# Patient Record
Sex: Male | Born: 1945 | Race: White | Hispanic: No | Marital: Married | Smoking: Current every day smoker
Health system: Southern US, Community
[De-identification: ages and names within clinical notes are randomized; demographics above are authoritative.]

## PROBLEM LIST (undated history)

## (undated) DIAGNOSIS — E785 Hyperlipidemia, unspecified: Secondary | ICD-10-CM

## (undated) DIAGNOSIS — Z86718 Personal history of other venous thrombosis and embolism: Secondary | ICD-10-CM

## (undated) DIAGNOSIS — I10 Essential (primary) hypertension: Secondary | ICD-10-CM

## (undated) DIAGNOSIS — I219 Acute myocardial infarction, unspecified: Secondary | ICD-10-CM

## (undated) DIAGNOSIS — I6529 Occlusion and stenosis of unspecified carotid artery: Secondary | ICD-10-CM

## (undated) DIAGNOSIS — Z860100 Personal history of colon polyps, unspecified: Secondary | ICD-10-CM

## (undated) HISTORY — DX: Personal history of colonic polyps: Z86.010

## (undated) HISTORY — DX: Hyperlipidemia, unspecified: E78.5

## (undated) HISTORY — PX: CARDIAC SURGERY: SHX584

## (undated) HISTORY — DX: Personal history of other venous thrombosis and embolism: Z86.718

## (undated) HISTORY — DX: Personal history of colon polyps, unspecified: Z86.0100

## (undated) HISTORY — DX: Occlusion and stenosis of unspecified carotid artery: I65.29

## (undated) HISTORY — PX: COLONOSCOPY: SHX174

## (undated) HISTORY — DX: Essential (primary) hypertension: I10

---

## 1988-12-06 HISTORY — PX: CORONARY ARTERY BYPASS GRAFT: SHX141

## 2010-07-11 ENCOUNTER — Emergency Department: Payer: Self-pay | Admitting: Emergency Medicine

## 2011-10-19 ENCOUNTER — Ambulatory Visit: Payer: Self-pay | Admitting: Gastroenterology

## 2011-12-14 ENCOUNTER — Ambulatory Visit: Payer: Self-pay | Admitting: Gastroenterology

## 2013-08-10 DIAGNOSIS — N402 Nodular prostate without lower urinary tract symptoms: Secondary | ICD-10-CM | POA: Insufficient documentation

## 2013-08-10 DIAGNOSIS — N138 Other obstructive and reflux uropathy: Secondary | ICD-10-CM | POA: Insufficient documentation

## 2013-08-10 DIAGNOSIS — R339 Retention of urine, unspecified: Secondary | ICD-10-CM | POA: Insufficient documentation

## 2013-08-28 ENCOUNTER — Ambulatory Visit: Payer: Self-pay | Admitting: Physical Medicine and Rehabilitation

## 2014-06-30 DIAGNOSIS — N401 Enlarged prostate with lower urinary tract symptoms: Secondary | ICD-10-CM

## 2014-06-30 DIAGNOSIS — Z951 Presence of aortocoronary bypass graft: Secondary | ICD-10-CM | POA: Insufficient documentation

## 2014-06-30 DIAGNOSIS — I10 Essential (primary) hypertension: Secondary | ICD-10-CM | POA: Insufficient documentation

## 2014-06-30 DIAGNOSIS — N138 Other obstructive and reflux uropathy: Secondary | ICD-10-CM | POA: Insufficient documentation

## 2014-06-30 DIAGNOSIS — I2510 Atherosclerotic heart disease of native coronary artery without angina pectoris: Secondary | ICD-10-CM | POA: Insufficient documentation

## 2014-06-30 DIAGNOSIS — M48062 Spinal stenosis, lumbar region with neurogenic claudication: Secondary | ICD-10-CM | POA: Insufficient documentation

## 2014-06-30 DIAGNOSIS — K635 Polyp of colon: Secondary | ICD-10-CM | POA: Insufficient documentation

## 2015-07-17 DIAGNOSIS — Z0000 Encounter for general adult medical examination without abnormal findings: Secondary | ICD-10-CM | POA: Insufficient documentation

## 2017-12-12 NOTE — Progress Notes (Signed)
[facility], [place] [address] Phone: [phone] Subjective:     CC: low back pain   EQA:[id number]  ACXEL Gregory Shepherd is a 72 y.o. male coming in with complaint of low back pain. He has had pain for 7 years. History of MRI confirming bulging discs. He said that his sciatic nerve pain has gotten worse. The right leg is always sore with movement and to touch. Walking and forward flexion both him the most. He also has numbness in the right leg. He use to walk 2.5 miles but is now only able to walk .25 miles. Stairs and inclines also bother him. His pain is achy.  Patient states that unfortunately this is changed his quality of life tremendously.  Sometimes a dull throbbing aching sensation at night as well.     History reviewed. No pertinent past medical history.  Past medical history is significant for multiple cardiovascular problems. History reviewed. No pertinent surgical history. Social History   Socioeconomic History  . Marital status: Married    Spouse name: None  . Number of children: None  . Years of education: None  . Highest education level: None  Social Needs  . Financial resource strain: None  . Food insecurity - worry: None  . Food insecurity - inability: None  . Transportation needs - medical: None  . Transportation needs - non-medical: None  Occupational History  . None  Tobacco Use  . Smoking status: None  Substance and Sexual Activity  . Alcohol use: None  . Drug use: None  . Sexual activity: None  Other Topics Concern  . None  Social History Narrative  . None   Not on File History reviewed. No pertinent family history.   Past medical history, social, surgical and family history all reviewed in electronic medical record.  No pertanent information unless stated regarding to the chief complaint.   Review of Systems:Review of systems updated and as accurate as of 12/13/17  No headache, visual changes,  nausea, vomiting, diarrhea, constipation, dizziness, abdominal pain, skin rash, fevers, chills, night sweats, weight loss, swollen lymph nodes, body aches, joint swelling,chest pain, shortness of breath, mood changes.  Positive muscle aches  Objective  Blood pressure 140/64, pulse (!) 50, height 6\' 3"  (1.905 m), weight 209 lb (94.8 kg), SpO2 98 %. Systems examined below as of 12/13/17   General: No apparent distress alert and oriented x3 mood and affect normal, dressed appropriately.  HEENT: Pupils equal, extraocular movements intact  Respiratory: Patient's speak in full sentences and does not appear short of breath  Cardiovascular: Trace lower extremity edema, non tender, no erythema  Skin: Warm dry intact with no signs of infection or rash on extremities or on axial skeleton.  Abdomen: Soft nontender  Neuro: Cranial nerves II through XII are intact, neurovascularly intact in all extremities with 2+ DTRs and 1+ distal pulses of the dorsalis pedis and the posterior tibialis Lymph: No lymphadenopathy of posterior or anterior cervical chain or axillae bilaterally.  Gait normal with good balance and coordination.  MSK:  Non tender with full range of motion and good stability and symmetric strength and tone of shoulders, elbows, wrist,  knee and ankles bilaterally.  Arthritic changes of multiple joints Back exam shows the patient does have decreasing range of motion in all planes.  Patient's does not have a negative straight leg test.  Patient though does have a positive fulcrum test severely as well as pain to percussion  of the foot at the groin as well as the femur area.  Tenderness over the quadricep as well.  4+ out of 5 strength of the right leg compared to the contralateral side.  No pain with internal rotation no of the hip.   Impression and Recommendations:     This case required medical decision making of moderate complexity.      Note: This dictation was prepared with Dragon  dictation along with smaller phrase technology. Any transcriptional errors that result from this process are unintentional.

## 2017-12-13 ENCOUNTER — Encounter: Payer: Self-pay | Admitting: Family Medicine

## 2017-12-13 ENCOUNTER — Ambulatory Visit: Payer: Medicare Other | Admitting: Family Medicine

## 2017-12-13 ENCOUNTER — Ambulatory Visit (INDEPENDENT_AMBULATORY_CARE_PROVIDER_SITE_OTHER)
Admission: RE | Admit: 2017-12-13 | Discharge: 2017-12-13 | Disposition: A | Discharge status: Home / Self Care | Payer: Medicare Other | Source: Ambulatory Visit | Attending: Family Medicine | Admitting: Family Medicine

## 2017-12-13 VITALS — BP 140/64 | HR 50 | Ht 75.0 in | Wt 209.0 lb

## 2017-12-13 DIAGNOSIS — M5416 Radiculopathy, lumbar region: Secondary | ICD-10-CM

## 2017-12-13 DIAGNOSIS — M79604 Pain in right leg: Secondary | ICD-10-CM

## 2017-12-13 MED ORDER — VITAMIN D (ERGOCALCIFEROL) 1.25 MG (50000 UNIT) PO CAPS: 50000.0000 [IU] | ORAL_CAPSULE | ORAL | 0 refills | Status: DC

## 2017-12-13 NOTE — Patient Instructions (Signed)
Good to meet you  Xrays downstairs Ice 20 minutes 2 times daily. Usually after activity and before bed. Avoid excessive walking Once weekly vitamin D for likely 12 weeks Thigh compression sleeve daily until I see you again but do not wear it at night They will call you about the test for the blood flow in the legs  See me again in 2-3 weeks

## 2017-12-13 NOTE — Assessment & Plan Note (Addendum)
I am concerned more for potential stress fracture of the femur than truly a lumbar radiculopathy at this time.  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  Patient is on gabapentin and is not noticing any improvement.  Do not feel going up on the dose would be beneficial.  We discussed icing regimen, compression, patient will try once weekly vitamin D.  Home exercises not given at this moment but will follow-up again in 2 weeks patient's history of vascular compromise I do feel that ABI could be beneficial as well to rule out anything else pathologic.  If continued have pain consider laboratory workup.

## 2017-12-15 ENCOUNTER — Ambulatory Visit (HOSPITAL_COMMUNITY)
Admission: RE | Admit: 2017-12-15 | Discharge: 2017-12-15 | Disposition: A | Discharge status: Home / Self Care | Payer: Medicare Other | Source: Ambulatory Visit | Attending: Cardiology | Admitting: Cardiology

## 2017-12-15 DIAGNOSIS — M79604 Pain in right leg: Secondary | ICD-10-CM

## 2017-12-15 DIAGNOSIS — I739 Peripheral vascular disease, unspecified: Secondary | ICD-10-CM | POA: Insufficient documentation

## 2017-12-15 DIAGNOSIS — R9439 Abnormal result of other cardiovascular function study: Secondary | ICD-10-CM | POA: Diagnosis not present

## 2017-12-19 ENCOUNTER — Telehealth: Payer: Self-pay | Admitting: Urology

## 2017-12-19 NOTE — Telephone Encounter (Signed)
Patient is transferring care with you and has a follow up app on the 30 th but will be out of his finasteride before the app. Can you call him in a refill to the [facility] please and thank you.  Sharyn Lull

## 2017-12-20 MED ORDER — FINASTERIDE 5 MG PO TABS: 5.0000 mg | ORAL_TABLET | Freq: Every day | ORAL | 0 refills | Status: DC

## 2017-12-20 NOTE — Telephone Encounter (Signed)
rx sent

## 2017-12-20 NOTE — Addendum Note (Signed)
Addended by: John Giovanni C on: 12/20/2017 02:09 PM   Modules accepted: Orders

## 2017-12-21 ENCOUNTER — Telehealth: Payer: Self-pay | Admitting: Urology

## 2017-12-21 NOTE — Telephone Encounter (Signed)
Patient notified of refill  Sharyn Lull

## 2017-12-28 NOTE — Progress Notes (Signed)
[name] [place], [place] [address] Phone: [phone] Subjective:    CC: Back and leg pain follow-up  VEL:[id number]  Gregory Shepherd is a 72 y.o. male coming in with complaint of leg pain follow-up.  Patient was found to have pain that seems to be more secondary to a stress reaction to the femur.  History of lumbar radiculopathy as well.  Patient is on gabapentin already and no significant benefit.  Given once weekly vitamin D and we discussed compression.  Patient has a past medical history significant for vascular compromise as well. Patient was sent for an ABI and did have moderate right lower extremity arterial disease with a right toe brachial index that is abnormal.   He states that he is doing better. He has been walking and is able to walk longer. He said that he has good days and bad days though. Inclines always bother him he states.     No past medical history on file. No past surgical history on file. Social History   Socioeconomic History  . Marital status: Married    Spouse name: Not on file  . Number of children: Not on file  . Years of education: Not on file  . Highest education level: Not on file  Social Needs  . Financial resource strain: Not on file  . Food insecurity - worry: Not on file  . Food insecurity - inability: Not on file  . Transportation needs - medical: Not on file  . Transportation needs - non-medical: Not on file  Occupational History  . Not on file  Tobacco Use  . Smoking status: Not on file  Substance and Sexual Activity  . Alcohol use: Not on file  . Drug use: Not on file  . Sexual activity: Not on file  Other Topics Concern  . Not on file  Social History Narrative  . Not on file   Not on File No family history on file.  No family history of autoimmune disease   Past medical history, social, surgical and family history all reviewed in electronic medical record.  No pertanent  information unless stated regarding to the chief complaint.   Review of Systems:Review of systems updated and as accurate as of 12/29/17  No headache, visual changes, nausea, vomiting, diarrhea, constipation, dizziness, abdominal pain, skin rash, fevers, chills, night sweats, weight loss, swollen lymph nodes, body aches, joint swelling, , chest pain, shortness of breath, mood changes.  Positive muscle aches  Objective  Blood pressure (!) 120/58, pulse (!) 54, height 6\' 3"  (1.905 m), weight 209 lb (94.8 kg), SpO2 95 %. Systems examined below as of 12/29/17   General: No apparent distress alert and oriented x3 mood and affect normal, dressed appropriately.  HEENT: Pupils equal, extraocular movements intact  Respiratory: Patient's speak in full sentences and does not appear short of breath  Cardiovascular: No lower extremity edema, non tender, no erythema  Skin: Warm dry intact with no signs of infection or rash on extremities or on axial skeleton.  Abdomen: Soft nontender  Neuro: Cranial nerves II through XII are intact, neurovascularly intact in all extremities with 2+ DTRs and 1+ pulses on the right lower extremity.   Lymph: No lymphadenopathy of posterior or anterior cervical chain or axillae bilaterally.  Gait antalgic gait MSK:  Non tender with full range of motion and good stability and symmetric strength and tone of shoulders, elbows, wrist, hip, knee and ankles bilaterally.  Right  femur is still significantly tender to palpation.  Does have atrophy of the musculature compared to the contralateral side.  1+ dorsalis pedis and posterior tibialis pulses noted.  Patient still has pain to palpation over the femur itself.  Positive fulcrum test.  Negative straight leg test.  Mild discomfort in the paraspinal musculature lumbar spine.  Near full range of motion of the hip.     Impression and Recommendations:     This case required medical decision making of moderate complexity.       Note: This dictation was prepared with Dragon dictation along with smaller phrase technology. Any transcriptional errors that result from this process are unintentional.

## 2017-12-29 ENCOUNTER — Ambulatory Visit: Payer: Medicare Other | Admitting: Family Medicine

## 2017-12-29 VITALS — BP 120/58 | HR 54 | Ht 75.0 in | Wt 209.0 lb

## 2017-12-29 DIAGNOSIS — M79604 Pain in right leg: Secondary | ICD-10-CM

## 2017-12-29 DIAGNOSIS — M79606 Pain in leg, unspecified: Secondary | ICD-10-CM

## 2017-12-29 MED ORDER — GABAPENTIN 100 MG PO CAPS: 100.0000 mg | ORAL_CAPSULE | Freq: Every day | ORAL | 3 refills | Status: DC

## 2017-12-29 NOTE — Patient Instructions (Signed)
Good to see you  I am concern of the blood flow. We will get you with vascular  [email or web address] size medium Continue the vitamin D still  Lets give it a little more time See me again in 4 weeks

## 2017-12-29 NOTE — Assessment & Plan Note (Signed)
Patient's right leg pain is likely secondary to peripheral vascular disease. Will send to vascular for further referral and consultation.  May need possible intervention.  Likely could be contributing to some of the aches and pains.  Still we will continue the vitamin D if there is any stress reaction.  No advanced imaging done at this time though.  X-rays of the back did show some progression of the arthritis and could have some lumbar radiculopathy.  We discussed the possibility of increasing the gabapentin to 400 mg at night.  Follow-up again 4 weeks

## 2018-01-04 ENCOUNTER — Ambulatory Visit (INDEPENDENT_AMBULATORY_CARE_PROVIDER_SITE_OTHER): Payer: Medicare Other | Admitting: Urology

## 2018-01-04 ENCOUNTER — Encounter: Payer: Self-pay | Admitting: Urology

## 2018-01-04 VITALS — BP 162/57 | HR 53 | Ht 75.0 in | Wt 211.4 lb

## 2018-01-04 DIAGNOSIS — R351 Nocturia: Secondary | ICD-10-CM | POA: Diagnosis not present

## 2018-01-04 DIAGNOSIS — N401 Enlarged prostate with lower urinary tract symptoms: Secondary | ICD-10-CM | POA: Diagnosis not present

## 2018-01-04 DIAGNOSIS — N138 Other obstructive and reflux uropathy: Secondary | ICD-10-CM

## 2018-01-04 DIAGNOSIS — E119 Type 2 diabetes mellitus without complications: Secondary | ICD-10-CM | POA: Insufficient documentation

## 2018-01-04 MED ORDER — FINASTERIDE 5 MG PO TABS: 5.0000 mg | ORAL_TABLET | Freq: Every day | ORAL | 3 refills | Status: DC

## 2018-01-04 MED ORDER — DESMOPRESSIN ACETATE 1.66 MCG/0.1ML NA EMUL: 1.6600 ug | Freq: Every day | NASAL | 0 refills | Status: DC

## 2018-01-04 NOTE — Progress Notes (Signed)
01/04/2018 8:26 PM   [name] 10-16-1946 [id number]  Referring provider: Kirk Ruths, MD [facility] [place], [place] [address]  Chief Complaint  Patient presents with  . Benign Prostatic Hypertrophy    HPI: 72 year old male presents for annual follow-up of BPH and a left prostate nodule.  A biopsy was performed in October 2014 for a left prostate nodule.  His PSA was initially elevated in the 4 range however returned to baseline at 2.4 on repeat.  Prostate volume was 107 cc.  He was tamsulosin and finasteride initially on combination therapy with was able to discontinue the  tamsulosin.  His most recent PSA has been low at less than 1.  He states his PSA was drawn at [facility] in August 2018 however was apparently not.  He is due to have blood drawn in February.  His only bothersome symptom is nocturia x2 at moderate to large volumes which is bothersome and has interrupted his sleep pattern.  He was inquiring if any other treatment options are available.  His serum sodium has been normal.   PMH: Past Medical History:  Diagnosis Date  . Carotid artery narrowing   . Hyperlipidemia   . Hypertension     Surgical History: Past Surgical History:  Procedure Laterality Date  . CARDIAC SURGERY      Home Medications:  Allergies as of 01/04/2018   No Known Allergies     Medication List        Accurate as of 01/04/18  8:26 PM. Always use your most recent med list.          amLODipine-benazepril 5-10 MG capsule Commonly known as:  LOTREL Take 1 capsule by mouth daily.   aspirin 81 MG chewable tablet Chew by mouth daily.   atenolol 25 MG tablet Commonly known as:  TENORMIN Take 25 mg by mouth daily.   atorvastatin 80 MG tablet Commonly known as:  LIPITOR Take 80 mg by mouth daily.   finasteride 5 MG tablet Commonly known as:  PROSCAR Take 1 tablet (5 mg total) by mouth daily.   gabapentin 300 MG  capsule Commonly known as:  NEURONTIN Take 300 mg by mouth 3 (three) times daily.   gabapentin 100 MG capsule Commonly known as:  NEURONTIN Take 1 capsule (100 mg total) by mouth at bedtime.   isosorbide mononitrate 60 MG 24 hr tablet Commonly known as:  IMDUR Take 60 mg by mouth daily.   multivitamin tablet Take 1 tablet by mouth daily.   NITROMIST 400 MCG/SPRAY Aers Generic drug:  Nitroglycerin Place onto the tongue.   omeprazole 20 MG capsule Commonly known as:  PRILOSEC Take 20 mg by mouth daily.   Vitamin D (Ergocalciferol) 50000 units Caps capsule Commonly known as:  DRISDOL Take 1 capsule (50,000 Units total) by mouth every 7 (seven) days.       Allergies: No Known Allergies  Family History: Family History  Problem Relation Age of Onset  . Prostate cancer Neg Hx   . Bladder Cancer Neg Hx   . Kidney cancer Neg Hx     Social History:  reports that he has been smoking.  he has never used smokeless tobacco. He reports that he does not drink alcohol or use drugs.  ROS: UROLOGY Frequent Urination?: Yes Hard to postpone urination?: Yes Burning/pain with urination?: No Get up at night to urinate?: Yes Leakage of urine?: No Urine stream starts and stops?: No Trouble starting  stream?: Yes Do you have to strain to urinate?: No Blood in urine?: No Urinary tract infection?: No Sexually transmitted disease?: No Injury to kidneys or bladder?: No Painful intercourse?: No Weak stream?: No Erection problems?: No Penile pain?: No  Gastrointestinal Nausea?: No Vomiting?: No Indigestion/heartburn?: No Diarrhea?: No Constipation?: No  Constitutional Fever: No Night sweats?: No Weight loss?: No Fatigue?: No  Skin Skin rash/lesions?: No Itching?: No  Eyes Blurred vision?: No Double vision?: No  Ears/Nose/Throat Sore throat?: No Sinus problems?: No  Hematologic/Lymphatic Swollen glands?: No Easy bruising?: No  Cardiovascular Leg swelling?:  No Chest pain?: No  Respiratory Cough?: No Shortness of breath?: No  Endocrine Excessive thirst?: No  Musculoskeletal Back pain?: No Joint pain?: No  Neurological Headaches?: No Dizziness?: No  Psychologic Depression?: No Anxiety?: No  Physical Exam: BP (!) 162/57 (BP Location: Right Arm, Patient Position: Sitting, Cuff Size: Normal)   Pulse (!) 53   Ht 6\' 3"  (1.905 m)   Wt 211 lb 6.4 oz (95.9 kg)   BMI 26.42 kg/m   Constitutional:  Alert and oriented, No acute distress. HEENT: [place] AT, moist mucus membranes.  Trachea midline, no masses. Cardiovascular: No clubbing, cyanosis, or edema. Respiratory: Normal respiratory effort, no increased work of breathing. GI: Abdomen is soft, nontender, nondistended, no abdominal masses GU: No CVA tenderness. Prostate 50 g with a stable nodule at the left base. Skin: No rashes, bruises or suspicious lesions. Lymph: No cervical or inguinal adenopathy. Neurologic: Grossly intact, no focal deficits, moving all 4 extremities. Psychiatric: Normal mood and affect.   Assessment & Plan:   1.  BPH with lower urinary tract symptoms Finasteride was refilled.  Discussed recent approval of Noctiva and he states his symptoms are bothersome enough that he desires a trial.  Will start at 1.66 mcg.  He was given dosing instructions at 1 spray 30 minutes prior to bedtime.  Follow-up serum sodium levels in 1 week in 1 month.  2.  Prostate nodule Stable.  He will have his PSA drawn next month.   Return in about 1 year (around 01/04/2019) for Recheck.  Abbie Sons, [place] [address] [place], [place] [address] [phone]

## 2018-01-11 ENCOUNTER — Other Ambulatory Visit: Payer: Self-pay

## 2018-01-11 ENCOUNTER — Other Ambulatory Visit: Payer: Medicare Other

## 2018-01-11 DIAGNOSIS — N401 Enlarged prostate with lower urinary tract symptoms: Secondary | ICD-10-CM

## 2018-01-12 LAB — SODIUM: Sodium: 140 mmol/L (ref 134–144)

## 2018-01-16 ENCOUNTER — Telehealth: Payer: Self-pay

## 2018-01-16 DIAGNOSIS — R351 Nocturia: Secondary | ICD-10-CM

## 2018-01-16 NOTE — Telephone Encounter (Signed)
Patient notified, please schedule follow up and have patient come 2 days prior for blood, orders placed\thanks

## 2018-01-16 NOTE — Telephone Encounter (Signed)
-----   Message from Abbie Sons, MD sent at 01/13/2018  1:15 PM EST ----- Serum sodium level was normal at 140.  He needs a follow-up appointment in 1 month with a serum sodium.

## 2018-01-17 NOTE — Telephone Encounter (Signed)
apps made  michelle

## 2018-01-18 ENCOUNTER — Encounter: Payer: Self-pay | Admitting: Vascular Surgery

## 2018-01-18 ENCOUNTER — Other Ambulatory Visit: Payer: Self-pay

## 2018-01-18 ENCOUNTER — Ambulatory Visit: Payer: Medicare Other | Admitting: Vascular Surgery

## 2018-01-18 VITALS — BP 149/68 | HR 50 | Temp 97.2°F | Resp 16 | Ht 75.0 in | Wt 210.0 lb

## 2018-01-18 DIAGNOSIS — I739 Peripheral vascular disease, unspecified: Secondary | ICD-10-CM

## 2018-01-18 NOTE — Progress Notes (Signed)
Patient ID: Gregory Shepherd, male   DOB: [birth year], 72 y.o.   MRN: [id number]  Reason for Consult: New Patient (Initial Visit) (Pain from R hip to his knee.  LE Art 12/15/2017.)   Referred by Kirk Ruths, MD  Subjective:     HPI:  Gregory Shepherd is a 72 y.o. male who presents for evaluation of right leg pain.  Specifically he has pain at rest of his right lateral leg.  He also has pain with activity of his right thigh usually happens after about 250 yards of walking.  He does remain very active.  He has no tissue loss or ulceration.  He has associated erectile dysfunction.  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  He does not have any history of lower extremity surgery otherwise.  He does not have any injury to his right leg because of the pain.  He does not have the right leg claudication does not have rest pain.  He has associated numbness in his right leg mostly when sitting that he associates with his sciatica.  After 250 yards walking he does have cramping in the thigh but this is less than the pain that is typically there on the lateral aspect of his leg.  He does take aspirin and statin.  Past Medical History:  Diagnosis Date  . Carotid artery narrowing   . Hyperlipidemia   . Hypertension    Family History  Problem Relation Age of Onset  . Prostate cancer Neg Hx   . Bladder Cancer Neg Hx   . Kidney cancer Neg Hx    Past Surgical History:  Procedure Laterality Date  . CARDIAC SURGERY      Short Social History:  Social History   Tobacco Use  . Smoking status: Current Every Day Smoker  . Smokeless tobacco: Never Used  . Tobacco comment: 8-9 cigarettes per day  Substance Use Topics  . Alcohol use: No    Frequency: Never    No Known Allergies  Current Outpatient Medications  Medication Sig Dispense Refill  . amLODipine-benazepril (LOTREL) 5-10 MG capsule Take 1 capsule by mouth daily.    Marland Kitchen aspirin  81 MG chewable tablet Chew by mouth daily.    Marland Kitchen atenolol (TENORMIN) 25 MG tablet Take 25 mg by mouth daily.    Marland Kitchen atorvastatin (LIPITOR) 80 MG tablet Take 80 mg by mouth daily.    Marland Kitchen Desmopressin Acetate (NOCTIVA) 1.66 MCG/0.1ML EMUL Place 1.66 mcg into the nose at bedtime. 1 Bottle 0  . finasteride (PROSCAR) 5 MG tablet Take 1 tablet (5 mg total) by mouth daily. 90 tablet 3  . gabapentin (NEURONTIN) 100 MG capsule Take 1 capsule (100 mg total) by mouth at bedtime. 30 capsule 3  . gabapentin (NEURONTIN) 300 MG capsule Take 300 mg by mouth 3 (three) times daily.    . isosorbide mononitrate (IMDUR) 60 MG 24 hr tablet Take 60 mg by mouth daily.    . Multiple Vitamin (MULTIVITAMIN) tablet Take 1 tablet by mouth daily.    . Nitroglycerin (NITROMIST) 400 MCG/SPRAY AERS Place onto the tongue.    Marland Kitchen omeprazole (PRILOSEC) 20 MG capsule Take 20 mg by mouth daily.    . Vitamin D, Ergocalciferol, (DRISDOL) 50000 units CAPS capsule Take 1 capsule (50,000 Units total) by mouth every 7 (seven) days. 12 capsule 0   No current facility-administered medications for this visit.     Review of Systems  Constitutional:  Constitutional negative. HENT: HENT negative.  Eyes: Eyes negative.  Respiratory: Respiratory negative.  GI: Gastrointestinal negative.  GU:       ED Musculoskeletal: Positive for leg pain and joint pain.  Skin: Skin negative.  Neurological: Positive for numbness.  Hematologic: Hematologic/lymphatic negative.  Psychiatric: Psychiatric negative.        Objective:  Objective   Vitals:   01/18/18 1028  BP: (!) 149/68  Pulse: (!) 50  Resp: 16  Temp: (!) 97.2 F (36.2 C)  TempSrc: Oral  SpO2: 98%  Weight: 210 lb (95.3 kg)  Height: 6\' 3"  (1.905 m)   Body mass index is 26.25 kg/m.  Physical Exam  Constitutional: He is oriented to person, place, and time. He appears well-developed.  HENT:  Head: Normocephalic.  Eyes: Pupils are equal, round, and reactive to light.  Neck: Normal  range of motion.  Cardiovascular:  Pulses:      Carotid pulses are 2+ on the right side, and 2+ on the left side.      Femoral pulses are 1+ on the right side, and 2+ on the left side.      Popliteal pulses are 0 on the right side.       Dorsalis pedis pulses are 0 on the right side, and 2+ on the left side.       Posterior tibial pulses are 0 on the right side.  Abdominal: Soft. He exhibits no mass.  Musculoskeletal: Normal range of motion.  Lymphadenopathy:    He has no cervical adenopathy.  Neurological: He is alert and oriented to person, place, and time.  Skin: Skin is warm and dry.  Psychiatric: He has a normal mood and affect. His behavior is normal. Thought content normal.    Data:      Assessment/Plan:     72 year old male who is a chronic smoker history of CABG and has right lower extremity pain with associated erectile dysfunction and a weakened right femoral pulse relative to the left.  He does have right thigh pain that sounds like possible claudication but this is after significant distance of walking.  Lateral leg pain does not seem to be associated with vascular disease.  I have offered angiogram with possible intervention told him that I am unsure that intervening would help him in any stent would require him to be on Plavix and would do better with him not smoking.  I also counseled him on smoking cessation but he seems generally disinterested.  This time he will follow-up in 1 year with repeat ABIs.  Should he have issues between now and then we can certainly see him sooner     Waynetta Sandy MD [facility]

## 2018-01-25 NOTE — Progress Notes (Signed)
[facility], [place] [address] Phone: [phone] Subjective:    I'm seeing this patient by the request  of:    CC: Right leg pain  BOF:[id number]  Gregory Shepherd is a 72 y.o. male coming in with complaint of hip pain on the right side. His pain is still there but has improved since his first visit with [place]. Is using gabapentin at night. He walks daily and this brings about most of his pain.   Patient did see vascular secondary to the abnormal ABI.  Vascular felt that surgical intervention would not be extremely helpful.  Stated that the risk outweighed the benefit.  Patient is wondering what else can be done.  Patient's primary care physician feels like this is more neurologic.  States that the gabapentin may help a little with nighttime but nothing significant.     Past Medical History:  Diagnosis Date  . Carotid artery narrowing   . Hyperlipidemia   . Hypertension    Past Surgical History:  Procedure Laterality Date  . CARDIAC SURGERY     Social History   Socioeconomic History  . Marital status: Married    Spouse name: None  . Number of children: None  . Years of education: None  . Highest education level: None  Social Needs  . Financial resource strain: None  . Food insecurity - worry: None  . Food insecurity - inability: None  . Transportation needs - medical: None  . Transportation needs - non-medical: None  Occupational History  . None  Tobacco Use  . Smoking status: Current Every Day Smoker  . Smokeless tobacco: Never Used  . Tobacco comment: 8-9 cigarettes per day  Substance and Sexual Activity  . Alcohol use: No    Frequency: Never  . Drug use: No  . Sexual activity: None  Other Topics Concern  . None  Social History Narrative  . None   No Known Allergies Family History  Problem Relation Age of Onset  . Prostate cancer Neg Hx   . Bladder Cancer Neg Hx   . Kidney cancer Neg Hx      Past  medical history, social, surgical and family history all reviewed in electronic medical record.  No pertanent information unless stated regarding to the chief complaint.   Review of Systems:Review of systems updated and as accurate as of 01/26/18  No headache, visual changes, nausea, vomiting, diarrhea, constipation, dizziness, abdominal pain, skin rash, fevers, chills, night sweats, weight loss, swollen lymph nodes, body aches, joint swelling,  chest pain, shortness of breath, mood changes.  Positive muscle aches  Objective  Blood pressure (!) 110/52, pulse (!) 56, height 6\' 3"  (1.905 m), weight 210 lb (95.3 kg), SpO2 97 %. Systems examined below as of 01/26/18   General: No apparent distress alert and oriented x3 mood and affect normal, dressed appropriately.  HEENT: Pupils equal, extraocular movements intact  Respiratory: Patient's speak in full sentences and does not appear short of breath  Cardiovascular: No lower extremity edema, non tender, no erythema  Skin: Warm dry intact with no signs of infection or rash on extremities or on axial skeleton.  Abdomen: Soft nontender  Neuro: Cranial nerves II through XII are intact, neurovascularly intact in all extremities with 2+ DTRs and 2+ pulses.  Lymph: No lymphadenopathy of posterior or anterior cervical chain or axillae bilaterally.  Gait mild antalgic gait MSK:  Non tender with full range of motion and good stability  and symmetric strength and tone of shoulders, elbows, wrist, hip, knee and ankles bilaterally.  Arthritic changes noted. Right leg does have some tightness with straight leg test.  Patient does have some mild nature of the right leg by approximately a quarter of an inch compared to the contralateral side.  Tenderness still more over the medial aspect of the leg than the lateral.  Patient does have 4 out of 5 strength on the right side compared to 4+ out of 5 strength on the left side.  Mild discomfort in the paraspinal musculature  of the lumbar spine.      Impression and Recommendations:     This case required medical decision making of moderate complexity.      Note: This dictation was prepared with Dragon dictation along with smaller phrase technology. Any transcriptional errors that result from this process are unintentional.

## 2018-01-26 ENCOUNTER — Ambulatory Visit: Payer: Medicare Other | Admitting: Family Medicine

## 2018-01-26 ENCOUNTER — Encounter: Payer: Self-pay | Admitting: Family Medicine

## 2018-01-26 DIAGNOSIS — M79604 Pain in right leg: Secondary | ICD-10-CM

## 2018-01-26 MED ORDER — CILOSTAZOL 50 MG PO TABS: 25.0000 mg | ORAL_TABLET | Freq: Two times a day (BID) | ORAL | 1 refills | Status: DC

## 2018-01-26 MED ORDER — GABAPENTIN 300 MG PO CAPS: 300.0000 mg | ORAL_CAPSULE | Freq: Three times a day (TID) | ORAL | 1 refills | Status: DC

## 2018-01-26 NOTE — Patient Instructions (Addendum)
We will try a couple things Continue the gabapentin  Pletal 1/2 tab 2 times a day for the blood flow in the leg  Try to increase activity slowly.  See me again in 4 weeks and if not better we will look at the back

## 2018-01-26 NOTE — Assessment & Plan Note (Signed)
Still difficult to tell for sure.  Patient likely does have claudication and I do think it is a mix of vascular and neurologic.  Encourage patient to take the gabapentin still regularly.  We discussed there are other medicines that could help that.  At the same time patient given a very small dose of Pletal.  Warned of potential side effects.  We are hoping that this could help with some of the peripheral vascular disease.  Patient is on numerous other medications and we discussed monitoring blood pressure closely though.  Patient is in agreement with the plan at this moment.  He stated he may check with the cardiologist.  Patient was in agreement with taking the risk at this time.  We discussed otherwise we would need to consider the possibility of epidurals in the back and likely because last MRI was from 2014 advanced imaging would be warranted.  Patient is following up in 4 weeks and we will see how patient responds.

## 2018-02-01 ENCOUNTER — Other Ambulatory Visit: Payer: Medicare Other

## 2018-02-04 ENCOUNTER — Encounter (INDEPENDENT_AMBULATORY_CARE_PROVIDER_SITE_OTHER): Payer: Self-pay

## 2018-02-13 ENCOUNTER — Other Ambulatory Visit: Payer: Self-pay

## 2018-02-13 ENCOUNTER — Other Ambulatory Visit: Payer: Medicare Other

## 2018-02-13 DIAGNOSIS — R351 Nocturia: Secondary | ICD-10-CM

## 2018-02-14 LAB — SODIUM: Sodium: 141 mmol/L (ref 134–144)

## 2018-02-15 ENCOUNTER — Encounter: Payer: Self-pay | Admitting: Urology

## 2018-02-15 ENCOUNTER — Ambulatory Visit: Payer: Medicare Other | Admitting: Urology

## 2018-02-15 VITALS — BP 123/70 | HR 54 | Wt 208.0 lb

## 2018-02-15 DIAGNOSIS — R351 Nocturia: Secondary | ICD-10-CM

## 2018-02-20 ENCOUNTER — Encounter: Payer: Self-pay | Admitting: Urology

## 2018-02-20 MED ORDER — DESMOPRESSIN ACETATE 1.66 MCG/0.1ML NA EMUL: 1.6600 ug | Freq: Every day | NASAL | 11 refills | Status: DC

## 2018-02-20 NOTE — Progress Notes (Signed)
02/15/2018 10:13 AM   Lowella Dell Arta Bruce [birth year] [phone]  Referring provider: Kirk Ruths, MD [facility] [place], [place] [address]  Chief Complaint  Patient presents with  . Benign Prostatic Hypertrophy    HPI: 72 year old male presents for follow-up of nocturia.  He was started on Noctiva and had resolution of his nocturia.  His sodium levels were normal.  He ran out of the samples approximately 4 days prior to his last sodium level in his nocturia had returned.  He was waiting on prior authorization of this medication by his insurance company.   PMH: Past Medical History:  Diagnosis Date  . Carotid artery narrowing   . Hyperlipidemia   . Hypertension     Surgical History: Past Surgical History:  Procedure Laterality Date  . CARDIAC SURGERY      Home Medications:  Allergies as of 02/15/2018   No Known Allergies     Medication List        Accurate as of 02/15/18 11:59 PM. Always use your most recent med list.          amLODipine-benazepril 5-10 MG capsule Commonly known as:  LOTREL Take 1 capsule by mouth daily.   aspirin 81 MG chewable tablet Chew by mouth daily.   atenolol 25 MG tablet Commonly known as:  TENORMIN Take 25 mg by mouth daily.   atorvastatin 80 MG tablet Commonly known as:  LIPITOR Take 80 mg by mouth daily.   cilostazol 50 MG tablet Commonly known as:  PLETAL Take 0.5 tablets (25 mg total) by mouth 2 (two) times daily.   Desmopressin Acetate 1.66 MCG/0.1ML Emul Commonly known as:  NOCTIVA Place 1.66 mcg into the nose at bedtime.   finasteride 5 MG tablet Commonly known as:  PROSCAR Take 1 tablet (5 mg total) by mouth daily.   gabapentin 100 MG capsule Commonly known as:  NEURONTIN Take 1 capsule (100 mg total) by mouth at bedtime.   gabapentin 300 MG capsule Commonly known as:  NEURONTIN Take 1 capsule (300 mg total) by mouth 3 (three) times daily.   isosorbide mononitrate  60 MG 24 hr tablet Commonly known as:  IMDUR Take 60 mg by mouth daily.   multivitamin tablet Take 1 tablet by mouth daily.   NITROMIST 400 MCG/SPRAY Aers Generic drug:  Nitroglycerin Place onto the tongue.   omeprazole 20 MG capsule Commonly known as:  PRILOSEC Take 20 mg by mouth daily.   Vitamin D (Ergocalciferol) 50000 units Caps capsule Commonly known as:  DRISDOL Take 1 capsule (50,000 Units total) by mouth every 7 (seven) days.       Allergies: No Known Allergies  Family History: Family History  Problem Relation Age of Onset  . Prostate cancer Neg Hx   . Bladder Cancer Neg Hx   . Kidney cancer Neg Hx     Social History:  reports that he has been smoking.  he has never used smokeless tobacco. He reports that he does not drink alcohol or use drugs.  ROS: UROLOGY Frequent Urination?: Yes Hard to postpone urination?: Yes Burning/pain with urination?: No Get up at night to urinate?: Yes Leakage of urine?: No Urine stream starts and stops?: No Trouble starting stream?: No Do you have to strain to urinate?: No Blood in urine?: No Urinary tract infection?: No Sexually transmitted disease?: No Injury to kidneys or bladder?: No Painful intercourse?: No Weak stream?: No Erection problems?: Yes Penile pain?: No  Gastrointestinal  Nausea?: No Vomiting?: No Indigestion/heartburn?: No Diarrhea?: No Constipation?: No  Constitutional Fever: No Night sweats?: No Weight loss?: No Fatigue?: No  Skin Skin rash/lesions?: No Itching?: No  Eyes Blurred vision?: No Double vision?: No  Ears/Nose/Throat Sore throat?: No Sinus problems?: No  Hematologic/Lymphatic Swollen glands?: No Easy bruising?: No  Cardiovascular Leg swelling?: No Chest pain?: No  Respiratory Cough?: No Shortness of breath?: No  Endocrine Excessive thirst?: No  Musculoskeletal Back pain?: No Joint pain?: No  Neurological Headaches?: No Dizziness?:  No  Psychologic Depression?: No Anxiety?: No  Physical Exam: BP 123/70   Pulse (!) 54   Wt 208 lb (94.3 kg)   BMI 26.00 kg/m   Constitutional:  Alert and oriented, No acute distress. HEENT: Gray AT, moist mucus membranes.  Trachea midline, no masses. Cardiovascular: No clubbing, cyanosis, or edema. Respiratory: Normal respiratory effort, no increased work of breathing. GI: Abdomen is soft, nontender, nondistended, no abdominal masses GU: No CVA tenderness Lymph: No cervical or inguinal lymphadenopathy. Skin: No rashes, bruises or suspicious lesions. Neurologic: Grossly intact, no focal deficits, moving all 4 extremities. Psychiatric: Normal mood and affect.    Assessment & Plan:   Significant improvement with Noctiva.  He was given additional samples pending insurance approval.    Abbie Sons, MD  [facility] [address], [place],  [address] [phone]

## 2018-02-22 NOTE — Progress Notes (Signed)
[facility] [address] [place], [place] [address] Phone: [phone] Subjective:    I'm seeing this patient by the request  of:    CC: Right leg and hip pain  HEN:[id number]  [name] is a 72 y.o. male coming in with complaint of right leg and hip pain.  Patient was found to have abnormal ABIs and likely a vascular compromise.  They decided to try to treat conservatively.  We started patient on Pletal for the vascular compromise.  Patient was given gabapentin that was only helping minorly.  Patient states that he is still having pain after walking. He describes a sensation of someone squeezing his leg. This goes away after 10 minutes. He has less numbness than usual. Patient tried pletal but had irregular heart beats and dizziness.  Patient states that may be able to walk longer distance with less pain than previously.     Past Medical History:  Diagnosis Date  . Carotid artery narrowing   . Hyperlipidemia   . Hypertension    Past Surgical History:  Procedure Laterality Date  . CARDIAC SURGERY     Social History   Socioeconomic History  . Marital status: Married    Spouse name: Not on file  . Number of children: Not on file  . Years of education: Not on file  . Highest education level: Not on file  Occupational History  . Not on file  Social Needs  . Financial resource strain: Not on file  . Food insecurity:    Worry: Not on file    Inability: Not on file  . Transportation needs:    Medical: Not on file    Non-medical: Not on file  Tobacco Use  . Smoking status: Current Every Day Smoker  . Smokeless tobacco: Never Used  . Tobacco comment: 8-9 cigarettes per day  Substance and Sexual Activity  . Alcohol use: No    Frequency: Never  . Drug use: No  . Sexual activity: Not on file  Lifestyle  . Physical activity:    Days per week: Not on file    Minutes per session: Not on file  . Stress: Not on file  Relationships  .  Social connections:    Talks on phone: Not on file    Gets together: Not on file    Attends religious service: Not on file    Active member of club or organization: Not on file    Attends meetings of clubs or organizations: Not on file    Relationship status: Not on file  Other Topics Concern  . Not on file  Social History Narrative  . Not on file   No Known Allergies Family History  Problem Relation Age of Onset  . Prostate cancer Neg Hx   . Bladder Cancer Neg Hx   . Kidney cancer Neg Hx      Past medical history, social, surgical and family history all reviewed in electronic medical record.  No pertanent information unless stated regarding to the chief complaint.   Review of Systems:Review of systems updated and as accurate as of 02/23/18  No headache, visual changes, nausea, vomiting, diarrhea, constipation, dizziness, abdominal pain, skin rash, fevers, chills, night sweats, weight loss, swollen lymph nodes, body aches, joint swelling, muscle aches, chest pain, shortness of breath, mood changes.   Objective  Blood pressure (!) 110/52, pulse (!) 56, height 6\' 3"  (1.905 m), weight 208 lb (94.3 kg), SpO2 97 %. Systems  examined below as of 02/23/18   General: No apparent distress alert and oriented x3 mood and affect normal, dressed appropriately.  HEENT: Pupils equal, extraocular movements intact  Respiratory: Patient's speak in full sentences and does not appear short of breath  Cardiovascular: No lower extremity edema, non tender, no erythema  Skin: Warm dry intact with no signs of infection or rash on extremities or on axial skeleton.  Abdomen: Soft nontender  Neuro: Cranial nerves II through XII are intact, neurovascularly intact in all extremities with 2+ DTRs and 2+ pulses.  Lymph: No lymphadenopathy of posterior or anterior cervical chain or axillae bilaterally.  Gait normal with good balance and coordination.  MSK: Mild tender with full range of motion and good  stability and symmetric strength and tone of shoulders, elbows, wrist, hip, knee and ankles bilaterally.  Arthritic changes of multiple joints Mild limitation with right-sided internal rotation of the hip.  Negative straight leg test.  No tenderness to the thigh pain with palpation.   Impression and Recommendations:     This case required medical decision making of moderate complexity.      Note: This dictation was prepared with Dragon dictation along with smaller phrase technology. Any transcriptional errors that result from this process are unintentional.

## 2018-02-23 ENCOUNTER — Ambulatory Visit: Payer: Medicare Other | Admitting: Family Medicine

## 2018-02-23 ENCOUNTER — Encounter: Payer: Self-pay | Admitting: Family Medicine

## 2018-02-23 DIAGNOSIS — M79604 Pain in right leg: Secondary | ICD-10-CM

## 2018-02-23 MED ORDER — GABAPENTIN 300 MG PO CAPS: 300.0000 mg | ORAL_CAPSULE | Freq: Three times a day (TID) | ORAL | 1 refills | Status: DC

## 2018-02-23 MED ORDER — VITAMIN D (ERGOCALCIFEROL) 1.25 MG (50000 UNIT) PO CAPS: 50000.0000 [IU] | ORAL_CAPSULE | ORAL | 0 refills | Status: DC

## 2018-02-23 MED ORDER — GABAPENTIN 100 MG PO CAPS: 100.0000 mg | ORAL_CAPSULE | Freq: Every day | ORAL | 3 refills | Status: DC

## 2018-02-23 NOTE — Patient Instructions (Signed)
Good to see you  Let double the aspirin Refilled all your meds and for 90 days.  COntinue everything else at this time.  Send a message in 4 weeks and tell me how it ifs going See me again in 6-8 weeks if you would like to discuss.

## 2018-02-23 NOTE — Assessment & Plan Note (Signed)
Patient's right leg pain still seems to be more vascular.  Has had some results with the gabapentin.  We discussed the potential of increasing this which patient declined.  Patient does not want to take that Pletal secondary to the symptoms.  Patient will start to use increase the aspirin to 2-81 mg.  We will see how patient responds.  Follow-up again in 6 weeks

## 2018-03-06 ENCOUNTER — Telehealth: Payer: Self-pay | Admitting: Urology

## 2018-03-06 ENCOUNTER — Encounter: Payer: Self-pay | Admitting: Urology

## 2018-03-06 NOTE — Telephone Encounter (Signed)
Pt called and stated samples of Noctiva worked great and he would like a prescription called in to [facility].

## 2018-03-07 ENCOUNTER — Encounter: Payer: Self-pay | Admitting: Urology

## 2018-03-07 NOTE — Telephone Encounter (Signed)
The prescription was sent on 3/18

## 2018-03-21 ENCOUNTER — Other Ambulatory Visit: Payer: Self-pay

## 2018-03-21 DIAGNOSIS — R351 Nocturia: Secondary | ICD-10-CM

## 2018-03-22 ENCOUNTER — Other Ambulatory Visit: Payer: Medicare Other

## 2018-03-22 DIAGNOSIS — R351 Nocturia: Secondary | ICD-10-CM

## 2018-03-23 ENCOUNTER — Telehealth: Payer: Self-pay

## 2018-03-23 LAB — SODIUM: Sodium: 141 mmol/L (ref 134–144)

## 2018-03-23 NOTE — Telephone Encounter (Signed)
-----   Message from Abbie Sons, MD sent at 03/23/2018  8:15 AM EDT ----- Sodium level was normal

## 2018-03-23 NOTE — Telephone Encounter (Signed)
Patient notified

## 2018-04-27 ENCOUNTER — Ambulatory Visit: Payer: Medicare Other | Admitting: Family Medicine

## 2018-07-26 ENCOUNTER — Ambulatory Visit: Payer: Medicare Other | Admitting: Podiatry

## 2018-07-26 ENCOUNTER — Encounter: Payer: Self-pay | Admitting: Podiatry

## 2018-07-26 ENCOUNTER — Ambulatory Visit (INDEPENDENT_AMBULATORY_CARE_PROVIDER_SITE_OTHER): Payer: Medicare Other

## 2018-07-26 DIAGNOSIS — M722 Plantar fascial fibromatosis: Secondary | ICD-10-CM

## 2018-07-26 DIAGNOSIS — L600 Ingrowing nail: Secondary | ICD-10-CM

## 2018-07-26 MED ORDER — METHYLPREDNISOLONE 4 MG PO TBPK: ORAL_TABLET | ORAL | 0 refills | Status: DC

## 2018-07-26 MED ORDER — NEOMYCIN-POLYMYXIN-HC 1 % OT SOLN: OTIC | 1 refills | Status: DC

## 2018-07-26 NOTE — Progress Notes (Signed)
Subjective:  Patient ID: Gregory Shepherd, male    DOB: [birth year],  MRN: [id number] HPI Chief Complaint  Patient presents with  . Foot Pain    Patient presents today for right heel pain x 2 weeks.  He states "it feels like Im walking on a rock"  He reports its very painful to step down first thing in the morning and to walk with hard soled shoes.  He bought inserts which has helped some    72 y.o. male presents with the above complaint.   He denies fever chills nausea vomiting muscle aches pains calf pain back pain chest pain shortness of breath.  Past Medical History:  Diagnosis Date  . Carotid artery narrowing   . Hyperlipidemia   . Hypertension    Past Surgical History:  Procedure Laterality Date  . CARDIAC SURGERY      Current Outpatient Medications:  .  amLODipine-benazepril (LOTREL) 5-10 MG capsule, Take 1 capsule by mouth daily., Disp: , Rfl:  .  aspirin 81 MG chewable tablet, Chew by mouth daily., Disp: , Rfl:  .  atenolol (TENORMIN) 25 MG tablet, Take 25 mg by mouth daily., Disp: , Rfl:  .  atorvastatin (LIPITOR) 80 MG tablet, Take 80 mg by mouth daily., Disp: , Rfl:  .  finasteride (PROSCAR) 5 MG tablet, Take 1 tablet (5 mg total) by mouth daily., Disp: 90 tablet, Rfl: 3 .  gabapentin (NEURONTIN) 100 MG capsule, Take 1 capsule (100 mg total) by mouth at bedtime., Disp: 90 capsule, Rfl: 3 .  gabapentin (NEURONTIN) 300 MG capsule, Take 1 capsule (300 mg total) by mouth 3 (three) times daily., Disp: 270 capsule, Rfl: 1 .  isosorbide mononitrate (IMDUR) 60 MG 24 hr tablet, Take 60 mg by mouth daily., Disp: , Rfl:  .  methylPREDNISolone (MEDROL DOSEPAK) 4 MG TBPK tablet, 6 day dose pack - take as directed, Disp: 21 tablet, Rfl: 0 .  Multiple Vitamin (MULTIVITAMIN) tablet, Take 1 tablet by mouth daily., Disp: , Rfl:  .  NEOMYCIN-POLYMYXIN-HYDROCORTISONE (CORTISPORIN) 1 % SOLN OTIC solution, Apply 1-2 drops to toe BID after soaking, Disp: 10 mL, Rfl: 1 .  Nitroglycerin  (NITROMIST) 400 MCG/SPRAY AERS, Place onto the tongue., Disp: , Rfl:  .  omeprazole (PRILOSEC) 20 MG capsule, Take 20 mg by mouth daily., Disp: , Rfl:  .  Vitamin D, Ergocalciferol, (DRISDOL) 50000 units CAPS capsule, Take 1 capsule (50,000 Units total) by mouth every 7 (seven) days., Disp: 12 capsule, Rfl: 0  No Known Allergies Review of Systems Objective:  There were no vitals filed for this visit.  General: Well developed, nourished, in no acute distress, alert and oriented x3   Dermatological: Skin is warm, dry and supple bilateral. Nails x 10 are well maintained; remaining integument appears unremarkable at this time. There are no open sores, no preulcerative lesions, no rash or signs of infection present.  Vascular: Dorsalis Pedis artery and Posterior Tibial artery pedal pulses are 2/4 bilateral with immedate capillary fill time. Pedal hair growth present. No varicosities and no lower extremity edema present bilateral.   Neruologic: Grossly intact via light touch bilateral. Vibratory intact via tuning fork bilateral. Protective threshold with Semmes Wienstein monofilament intact to all pedal sites bilateral. Patellar and Achilles deep tendon reflexes 2+ bilateral. No Babinski or clonus noted bilateral.   Musculoskeletal: No gross boney pedal deformities bilateral. No pain, crepitus, or limitation noted with foot and ankle range of motion bilateral. Muscular strength 5/5 in all groups tested bilateral.  Gait: Unassisted, Nonantalgic.    Radiographs:  Radiographs taken today demonstrate a soft tissue increase in density plantar fashion calcaneal insertion site of the right heel.  No acute findings.  Assessment & Plan:   Assessment: Ingrown toenail fibular border second digit right foot.  Plantar fasciitis right foot.  Plan: We discussed the etiology pathology conservative or surgical therapies at this point in time started him on a Medrol Dosepak injected 20 mg Kenalog 5 mg Marcaine  medial aspect of the right heel after sterile Betadine skin prep.  Placement plantar fascial brace and a night splint.  Discussed appropriate shoe gear stretching exercise ice therapy sugar modifications and possible need for orthotics.  Performed a chemical matrixectomy to the fibular border of the second digit of the right foot after a sterile Betadine skin prep and injection of 2 cc of a 50-50 mixture of Marcaine plain lidocaine plain in the proximal toe.  He tolerated the matrixectomy well and was provided with both oral and written home-going instruction for the care and soaking of his toe as well as prescription for Cortisporin Otic to be applied twice daily after soaking.  I will follow-up with him in a couple weeks just to make sure he is healing well.     Edlin Ford T. [place], [place]

## 2018-07-26 NOTE — Patient Instructions (Signed)
Betadine Soak Instructions  Purchase an 8 oz. bottle of BETADINE solution (Povidone)  THE DAY AFTER THE PROCEDURE  Place 1 tablespoon of betadine solution in a quart of warm tap water.  Submerge your foot or feet with outer bandage intact for the initial soak; this will allow the bandage to become moist and wet for easy lift off.  Once you remove your bandage, continue to soak in the solution for 20 minutes.  This soak should be done twice a day.  Next, remove your foot or feet from solution, blot dry the affected area and cover.  You may use a band aid large enough to cover the area or use gauze and tape.  Apply other medications to the area as directed by the doctor such as cortisporin otic solution (ear drops) or neosporin.  IF YOUR SKIN BECOMES IRRITATED WHILE USING THESE INSTRUCTIONS, IT IS OKAY TO SWITCH TO EPSOM SALTS AND WATER OR WHITE VINEGAR AND WATER.   [place] Instructions-Post Nail Surgery  You have had your ingrown toenail and root treated with a chemical.  This chemical causes a burn that will drain and ooze like a blister.  This can drain for 6-8 weeks or longer.  It is important to keep this area clean, covered, and follow the soaking instructions dispensed at the time of your surgery.  This area will eventually dry and form a scab.  Once the scab forms you no longer need to soak or apply a dressing.  If at any time you experience an increase in pain, redness, swelling, or drainage, you should contact the office as soon as possible.   For instructions on how to put on your Plantar Fascial Brace, please visit [email or web address] For instructions on how to put on your Night Splint, please visit [email or web address]   Plantar Fasciitis (Heel Spur Syndrome) with Rehab The plantar fascia is a fibrous, ligament-like, soft-tissue structure that spans the bottom of the foot. Plantar fasciitis is a condition that causes pain in the foot due to inflammation of the  tissue. SYMPTOMS   Pain and tenderness on the underneath side of the foot.  Pain that worsens with standing or walking. CAUSES  Plantar fasciitis is caused by irritation and injury to the plantar fascia on the underneath side of the foot. Common mechanisms of injury include:  Direct trauma to bottom of the foot.  Damage to a small nerve that runs under the foot where the main fascia attaches to the heel bone.  Stress placed on the plantar fascia due to bone spurs. RISK INCREASES WITH:   Activities that place stress on the plantar fascia (running, jumping, pivoting, or cutting).  Poor strength and flexibility.  Improperly fitted shoes.  Tight calf muscles.  Flat feet.  Failure to warm-up properly before activity.  Obesity. PREVENTION  Warm up and stretch properly before activity.  Allow for adequate recovery between workouts.  Maintain physical fitness:  Strength, flexibility, and endurance.  Cardiovascular fitness.  Maintain a health body weight.  Avoid stress on the plantar fascia.  Wear properly fitted shoes, including arch supports for individuals who have flat feet.  PROGNOSIS  If treated properly, then the symptoms of plantar fasciitis usually resolve without surgery. However, occasionally surgery is necessary.  RELATED COMPLICATIONS   Recurrent symptoms that may result in a chronic condition.  Problems of the lower back that are caused by compensating for the injury, such as limping.  Pain or weakness of the foot during push-off following surgery.  Chronic inflammation, scarring, and partial or complete fascia tear, occurring more often from repeated injections.  TREATMENT  Treatment initially involves the use of ice and medication to help reduce pain and inflammation. The use of strengthening and stretching exercises may help reduce pain with activity, especially stretches of the Achilles tendon. These exercises may be performed at home or with a  therapist. Your caregiver may recommend that you use heel cups of arch supports to help reduce stress on the plantar fascia. Occasionally, corticosteroid injections are given to reduce inflammation. If symptoms persist for greater than 6 months despite non-surgical (conservative), then surgery may be recommended.   MEDICATION   If pain medication is necessary, then nonsteroidal anti-inflammatory medications, such as aspirin and ibuprofen, or other minor pain relievers, such as acetaminophen, are often recommended.  Do not take pain medication within 7 days before surgery.  Prescription pain relievers may be given if deemed necessary by your caregiver. Use only as directed and only as much as you need.  Corticosteroid injections may be given by your caregiver. These injections should be reserved for the most serious cases, because they may only be given a certain number of times.  HEAT AND COLD  Cold treatment (icing) relieves pain and reduces inflammation. Cold treatment should be applied for 10 to 15 minutes every 2 to 3 hours for inflammation and pain and immediately after any activity that aggravates your symptoms. Use ice packs or massage the area with a piece of ice (ice massage).  Heat treatment may be used prior to performing the stretching and strengthening activities prescribed by your caregiver, physical therapist, or athletic trainer. Use a heat pack or soak the injury in warm water.  SEEK IMMEDIATE MEDICAL CARE IF:  Treatment seems to offer no benefit, or the condition worsens.  Any medications produce adverse side effects.  EXERCISES- RANGE OF MOTION (ROM) AND STRETCHING EXERCISES - Plantar Fasciitis (Heel Spur Syndrome) These exercises may help you when beginning to rehabilitate your injury. Your symptoms may resolve with or without further involvement from your physician, physical therapist or athletic trainer. While completing these exercises, remember:   Restoring tissue  flexibility helps normal motion to return to the joints. This allows healthier, less painful movement and activity.  An effective stretch should be held for at least 30 seconds.  A stretch should never be painful. You should only feel a gentle lengthening or release in the stretched tissue.  RANGE OF MOTION - Toe Extension, Flexion  Sit with your right / left leg crossed over your opposite knee.  Grasp your toes and gently pull them back toward the top of your foot. You should feel a stretch on the bottom of your toes and/or foot.  Hold this stretch for 10 seconds.  Now, gently pull your toes toward the bottom of your foot. You should feel a stretch on the top of your toes and or foot.  Hold this stretch for 10 seconds. Repeat  times. Complete this stretch 3 times per day.   RANGE OF MOTION - Ankle Dorsiflexion, Active Assisted  Remove shoes and sit on a chair that is preferably not on a carpeted surface.  Place right / left foot under knee. Extend your opposite leg for support.  Keeping your heel down, slide your right / left foot back toward the chair until you feel a stretch at your ankle or calf. If you do not feel a stretch, slide your bottom forward to the edge of the chair, while  still keeping your heel down.  Hold this stretch for 10 seconds. Repeat 3 times. Complete this stretch 2 times per day.   STRETCH  Gastroc, Standing  Place hands on wall.  Extend right / left leg, keeping the front knee somewhat bent.  Slightly point your toes inward on your back foot.  Keeping your right / left heel on the floor and your knee straight, shift your weight toward the wall, not allowing your back to arch.  You should feel a gentle stretch in the right / left calf. Hold this position for 10 seconds. Repeat 3 times. Complete this stretch 2 times per day.  STRETCH  Soleus, Standing  Place hands on wall.  Extend right / left leg, keeping the other knee somewhat  bent.  Slightly point your toes inward on your back foot.  Keep your right / left heel on the floor, bend your back knee, and slightly shift your weight over the back leg so that you feel a gentle stretch deep in your back calf.  Hold this position for 10 seconds. Repeat 3 times. Complete this stretch 2 times per day.  STRETCH  Gastrocsoleus, Standing  Note: This exercise can place a lot of stress on your foot and ankle. Please complete this exercise only if specifically instructed by your caregiver.   Place the ball of your right / left foot on a step, keeping your other foot firmly on the same step.  Hold on to the wall or a rail for balance.  Slowly lift your other foot, allowing your body weight to press your heel down over the edge of the step.  You should feel a stretch in your right / left calf.  Hold this position for 10 seconds.  Repeat this exercise with a slight bend in your right / left knee. Repeat 3 times. Complete this stretch 2 times per day.   STRENGTHENING EXERCISES - Plantar Fasciitis (Heel Spur Syndrome)  These exercises may help you when beginning to rehabilitate your injury. They may resolve your symptoms with or without further involvement from your physician, physical therapist or athletic trainer. While completing these exercises, remember:   Muscles can gain both the endurance and the strength needed for everyday activities through controlled exercises.  Complete these exercises as instructed by your physician, physical therapist or athletic trainer. Progress the resistance and repetitions only as guided.  STRENGTH - Towel Curls  Sit in a chair positioned on a non-carpeted surface.  Place your foot on a towel, keeping your heel on the floor.  Pull the towel toward your heel by only curling your toes. Keep your heel on the floor. Repeat 3 times. Complete this exercise 2 times per day.  STRENGTH - Ankle Inversion  Secure one end of a rubber exercise  band/tubing to a fixed object (table, pole). Loop the other end around your foot just before your toes.  Place your fists between your knees. This will focus your strengthening at your ankle.  Slowly, pull your big toe up and in, making sure the band/tubing is positioned to resist the entire motion.  Hold this position for 10 seconds.  Have your muscles resist the band/tubing as it slowly pulls your foot back to the starting position. Repeat 3 times. Complete this exercises 2 times per day.  Document Released: 11/22/2005 Document Revised: 02/14/2012 Document Reviewed: 03/06/2009 [facility] Patient Information 2014 [place], [place].

## 2018-08-21 ENCOUNTER — Ambulatory Visit (INDEPENDENT_AMBULATORY_CARE_PROVIDER_SITE_OTHER): Payer: Medicare Other | Admitting: Podiatry

## 2018-08-21 ENCOUNTER — Encounter: Payer: Self-pay | Admitting: Podiatry

## 2018-08-21 DIAGNOSIS — L600 Ingrowing nail: Secondary | ICD-10-CM

## 2018-08-21 NOTE — Progress Notes (Signed)
He presents today for follow-up of his plantar fasciitis and his matrixectomy second digit right foot.  He states that he is doing great is very happy with it is absolutely no problems whatsoever.  Objective: Vital signs are stable he is alert and oriented x3.  Pulses are palpable.  Neurologic sensorium is intact.  Deep tendon reflex are intact matricectomy is gone on to heal uneventfully there is no erythema edema cellulitis drainage or odor.  He has no pain on palpation medial calcaneal tubercle of the right heel.  He has new tennis shoes.  Assessment: Well-healing surgical toe second right well-healed plantar fasciitis right.  Plan: Follow-up with me on an as-needed basis.

## 2018-12-12 ENCOUNTER — Encounter: Payer: Self-pay | Admitting: Family Medicine

## 2018-12-29 ENCOUNTER — Other Ambulatory Visit: Payer: Self-pay

## 2018-12-29 MED ORDER — FINASTERIDE 5 MG PO TABS: 5.0000 mg | ORAL_TABLET | Freq: Every day | ORAL | 0 refills | Status: DC

## 2019-01-03 ENCOUNTER — Ambulatory Visit: Payer: Medicare Other | Admitting: Urology

## 2019-02-02 ENCOUNTER — Ambulatory Visit (INDEPENDENT_AMBULATORY_CARE_PROVIDER_SITE_OTHER)
Admission: RE | Admit: 2019-02-02 | Discharge: 2019-02-02 | Disposition: A | Discharge status: Home / Self Care | Payer: Medicare Other | Source: Ambulatory Visit | Attending: Family Medicine | Admitting: Family Medicine

## 2019-02-02 ENCOUNTER — Encounter: Payer: Self-pay | Admitting: Family Medicine

## 2019-02-02 ENCOUNTER — Other Ambulatory Visit (HOSPITAL_COMMUNITY): Payer: Self-pay | Admitting: Family Medicine

## 2019-02-02 ENCOUNTER — Ambulatory Visit (INDEPENDENT_AMBULATORY_CARE_PROVIDER_SITE_OTHER): Payer: Medicare Other | Admitting: Family Medicine

## 2019-02-02 VITALS — BP 118/50 | HR 53 | Ht 75.0 in | Wt 214.0 lb

## 2019-02-02 DIAGNOSIS — R6889 Other general symptoms and signs: Secondary | ICD-10-CM

## 2019-02-02 DIAGNOSIS — M25551 Pain in right hip: Secondary | ICD-10-CM

## 2019-02-02 DIAGNOSIS — M4807 Spinal stenosis, lumbosacral region: Secondary | ICD-10-CM

## 2019-02-02 DIAGNOSIS — I739 Peripheral vascular disease, unspecified: Secondary | ICD-10-CM

## 2019-02-02 DIAGNOSIS — M48062 Spinal stenosis, lumbar region with neurogenic claudication: Secondary | ICD-10-CM

## 2019-02-02 DIAGNOSIS — M79604 Pain in right leg: Secondary | ICD-10-CM

## 2019-02-02 NOTE — Progress Notes (Signed)
[name] [address], [place] [address] Phone: [phone] Subjective:     CC: Right leg pain  ZDG:[id number]   02/23/2018: Patient's right leg pain still seems to be more vascular.  Has had some results with the gabapentin.  We discussed the potential of increasing this which patient declined.  Patient does not want to take that Pletal secondary to the symptoms.  Patient will start to use increase the aspirin to 2-81 mg.  We will see how patient responds.  Follow-up again in 6 weeks  Update 02/02/2019: Gregory Shepherd is a 73 y.o. male coming in with complaint of right leg pain. Pain was lateral and has moved to the groin. Leg still becomes numb with physical activity. Is unable to walk for longer periods without the leg going numb. Has self prescribed the [place] exercises. If patient sits down for 15 minutes the numbness subsides. Continues to use gabapentin 300mg  3x a day and 100mg  before bed.  Patient was seen previously did have abnormal blood flow into the right lower extremity.  Patient did discuss with vascular surgery about the possibility of stenting the patient did not want to be on Plavix long-term.  Patient was doing Pletal and doing relatively well but unfortunately had side effects to it so discontinued it.  Patient has been taking 2 baby aspirin's daily since last visit which was nearly a year ago.  Feels though that the pain is not improving.  Does have x-rays of the lower back and history of an L4-L5 nerve root impingement from an MRI that was independently visualized by me from 2014.    Past Medical History:  Diagnosis Date  . Carotid artery narrowing   . Hyperlipidemia   . Hypertension    Past Surgical History:  Procedure Laterality Date  . CARDIAC SURGERY     Social History   Socioeconomic History  . Marital status: Married    Spouse name: Not on file  . Number of children: Not on file  . Years of education: Not on  file  . Highest education level: Not on file  Occupational History  . Not on file  Social Needs  . Financial resource strain: Not on file  . Food insecurity:    Worry: Not on file    Inability: Not on file  . Transportation needs:    Medical: Not on file    Non-medical: Not on file  Tobacco Use  . Smoking status: Current Every Day Smoker  . Smokeless tobacco: Never Used  . Tobacco comment: 8-9 cigarettes per day  Substance and Sexual Activity  . Alcohol use: No    Frequency: Never  . Drug use: No  . Sexual activity: Not on file  Lifestyle  . Physical activity:    Days per week: Not on file    Minutes per session: Not on file  . Stress: Not on file  Relationships  . Social connections:    Talks on phone: Not on file    Gets together: Not on file    Attends religious service: Not on file    Active member of club or organization: Not on file    Attends meetings of clubs or organizations: Not on file    Relationship status: Not on file  Other Topics Concern  . Not on file  Social History Narrative  . Not on file   No Known Allergies Family History  Problem Relation Age of Onset  .  Prostate cancer Neg Hx   . Bladder Cancer Neg Hx   . Kidney cancer Neg Hx     Current Outpatient Medications (Endocrine & Metabolic):  .  methylPREDNISolone (MEDROL DOSEPAK) 4 MG TBPK tablet, 6 day dose pack - take as directed  Current Outpatient Medications (Cardiovascular):  .  amLODipine-benazepril (LOTREL) 5-10 MG capsule, Take 1 capsule by mouth daily. Marland Kitchen  atenolol (TENORMIN) 25 MG tablet, Take 25 mg by mouth daily. Marland Kitchen  atorvastatin (LIPITOR) 80 MG tablet, Take 80 mg by mouth daily. .  isosorbide mononitrate (IMDUR) 60 MG 24 hr tablet, Take 60 mg by mouth daily. .  Nitroglycerin (NITROMIST) 400 MCG/SPRAY AERS, Place onto the tongue.   Current Outpatient Medications (Analgesics):  .  aspirin 81 MG chewable tablet, Chew by mouth daily.   Current Outpatient Medications (Other):  .   finasteride (PROSCAR) 5 MG tablet, Take 1 tablet (5 mg total) by mouth daily. Marland Kitchen  gabapentin (NEURONTIN) 100 MG capsule, Take 1 capsule (100 mg total) by mouth at bedtime. .  gabapentin (NEURONTIN) 300 MG capsule, Take 1 capsule (300 mg total) by mouth 3 (three) times daily. .  Multiple Vitamin (MULTIVITAMIN) tablet, Take 1 tablet by mouth daily. .  NEOMYCIN-POLYMYXIN-HYDROCORTISONE (CORTISPORIN) 1 % SOLN OTIC solution, Apply 1-2 drops to toe BID after soaking .  omeprazole (PRILOSEC) 20 MG capsule, Take 20 mg by mouth daily. .  Vitamin D, Ergocalciferol, (DRISDOL) 50000 units CAPS capsule, Take 1 capsule (50,000 Units total) by mouth every 7 (seven) days.    Past medical history, social, surgical and family history all reviewed in electronic medical record.  No pertanent information unless stated regarding to the chief complaint.   Review of Systems:  No headache, visual changes, nausea, vomiting, diarrhea, constipation, dizziness, abdominal pain, skin rash, fevers, chills, night sweats, weight loss, swollen lymph nodes, , chest pain, shortness of breath, mood changes.  Muscle aches and fatigue, positive body aches  Objective  Blood pressure (!) 118/50, pulse (!) 53, height 6\' 3"  (1.905 m), weight 214 lb (97.1 kg), SpO2 96 %.     General: No apparent distress alert and oriented x3 mood and affect normal, dressed appropriately.  HEENT: Pupils equal, extraocular movements intact  Respiratory: Patient's speak in full sentences and does not appear short of breath  Cardiovascular: No lower extremity edema, non tender, no erythema  Skin: Warm dry intact with no signs of infection or rash on extremities or on axial skeleton.  Abdomen: Soft nontender  Neuro: Cranial nerves II through XII are intact, neurovascularly intact in all extremities with 2+ DTRs and mild decrease in dorsalis pedis pulse on the right side posterior tibialis no 2+ and symmetric Lymph: No lymphadenopathy of posterior or  anterior cervical chain or axillae bilaterally.  Gait mild antalgic MSK: Mild tender with limited range of motion and good stability and symmetric strength and tone of shoulders, elbows, wrist, and ankles bilaterally.  Right hip exam shows the patient does have decreased range of motion in all planes with a positive Faber test.  Patient though does have pain with internal range of motion and lacks some internal range of motion compared to the contralateral side.  Patient has tightness of the hamstring patient states that has numbness noted in the foot at all times.  Deep tendon reflexes appear to be intact.  Dorsalis pedis pulse 1+ on the right compared to the contralateral side     Impression and Recommendations:     This case required medical decision  making of moderate complexity. The above documentation has been reviewed and is accurate and complete Lyndal Pulley, DO       Note: This dictation was prepared with Dragon dictation along with smaller phrase technology. Any transcriptional errors that result from this process are unintentional.

## 2019-02-02 NOTE — Patient Instructions (Signed)
Good to see you  March 5th at 1 pm at the [facility] building above [facility] Also xray downstairs today  MRI orderfor the back and call [phone] to schedule Once you know when the MRi is done then come see me again 1-2 days after and we will discuss.  Will need you to call us to make appointment

## 2019-02-03 DIAGNOSIS — I739 Peripheral vascular disease, unspecified: Secondary | ICD-10-CM | POA: Insufficient documentation

## 2019-02-03 NOTE — Assessment & Plan Note (Signed)
Patient has had difficulty with peripheral arterial and peripheral vascular disease previously.  Encourage patient to continue the aspirin.  Due to recommendations by vascular M like to repeat ABI further evaluate the possibility need for stenting if any worsening patient symptoms is correlated with some of the vascular aspect.  X-rays of the hip also ordered today secondary to decreasing range of motion from previous exam.  Differential also includes lumbar radiculopathy and at this point with patient symptoms lasting so long and worsening I do feel a repeat MRI of the lumbar spine is necessary.  Patient could be a candidate for epidural steroid injections if needed follow-up again after all imaging is done.

## 2019-02-08 ENCOUNTER — Encounter (HOSPITAL_COMMUNITY): Payer: Medicare Other

## 2019-02-13 ENCOUNTER — Ambulatory Visit (HOSPITAL_COMMUNITY)
Admission: RE | Admit: 2019-02-13 | Discharge: 2019-02-13 | Disposition: A | Discharge status: Home / Self Care | Payer: Medicare Other | Source: Ambulatory Visit | Attending: Cardiology | Admitting: Cardiology

## 2019-02-13 DIAGNOSIS — I739 Peripheral vascular disease, unspecified: Secondary | ICD-10-CM | POA: Insufficient documentation

## 2019-02-13 DIAGNOSIS — R6889 Other general symptoms and signs: Secondary | ICD-10-CM | POA: Diagnosis not present

## 2019-02-14 ENCOUNTER — Telehealth: Payer: Self-pay | Admitting: *Deleted

## 2019-02-14 DIAGNOSIS — I739 Peripheral vascular disease, unspecified: Secondary | ICD-10-CM

## 2019-02-14 NOTE — Telephone Encounter (Signed)
Referral entered & sent to [facility].

## 2019-02-14 NOTE — Telephone Encounter (Signed)
-----   Message from Lyndal Pulley, DO sent at 02/13/2019  4:32 PM EDT ----- Need to send to vascular consult  Does appear to be blood flow again and worsening.

## 2019-02-16 ENCOUNTER — Ambulatory Visit
Admission: RE | Admit: 2019-02-16 | Discharge: 2019-02-16 | Disposition: A | Discharge status: Home / Self Care | Payer: Medicare Other | Source: Ambulatory Visit | Attending: Family Medicine | Admitting: Family Medicine

## 2019-02-16 ENCOUNTER — Other Ambulatory Visit: Payer: Self-pay

## 2019-02-16 DIAGNOSIS — M4807 Spinal stenosis, lumbosacral region: Secondary | ICD-10-CM

## 2019-02-19 ENCOUNTER — Encounter: Payer: Self-pay | Admitting: Family Medicine

## 2019-02-19 DIAGNOSIS — M48062 Spinal stenosis, lumbar region with neurogenic claudication: Secondary | ICD-10-CM

## 2019-02-19 NOTE — Telephone Encounter (Signed)
Smith pt

## 2019-02-20 ENCOUNTER — Encounter: Payer: Self-pay | Admitting: Cardiovascular Disease

## 2019-02-20 ENCOUNTER — Other Ambulatory Visit: Payer: Self-pay

## 2019-02-20 ENCOUNTER — Ambulatory Visit: Payer: Medicare Other | Admitting: Cardiovascular Disease

## 2019-02-20 VITALS — BP 136/61 | HR 47 | Ht 75.0 in | Wt 212.8 lb

## 2019-02-20 DIAGNOSIS — I739 Peripheral vascular disease, unspecified: Secondary | ICD-10-CM | POA: Diagnosis not present

## 2019-02-20 DIAGNOSIS — E782 Mixed hyperlipidemia: Secondary | ICD-10-CM

## 2019-02-20 DIAGNOSIS — I10 Essential (primary) hypertension: Secondary | ICD-10-CM | POA: Diagnosis not present

## 2019-02-20 DIAGNOSIS — E785 Hyperlipidemia, unspecified: Secondary | ICD-10-CM | POA: Insufficient documentation

## 2019-02-20 NOTE — Assessment & Plan Note (Signed)
History of CAD status post remote myocardial infarction when he was 73 years old.  He had CABG x2 at [facility] in 1990 and redo bypass surgery x3 8 years later.  He is fairly asymptomatic and has not taken any sublingual nitroglycerin in years.

## 2019-02-20 NOTE — Assessment & Plan Note (Signed)
History of hyperlipidemia on statin therapy followed by his PCP 

## 2019-02-20 NOTE — Progress Notes (Signed)
02/20/2019 [name]   01-04-1946  [id number]  Primary Physician Kirk Ruths, MD Primary Cardiologist: Lorretta Harp MD FACP, [place], [place], [place]  HPI:  Gregory Shepherd is a 73 y.o. mild to moderately overweight married Caucasian male father of 2, grandfather of 3 grandchildren is accompanied by his wife [name].  He was referred by Dr. Gardenia Phlegm for peripheral vascular valuation because of claudication.  He is retired from being in [employer]  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  He has treated hypertension hyperlipidemia.  His father did have a myocardial infarction.  He had CABG x2 at [facility] in [place] by Dr. Eliberto Ivory.  He did have a remote MI at age 68 as well.  He had redo bypass surgery x3 at [place] 8 years later.  He denies chest pain.  His major issue is with left lower extremity numbness and claudication.  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  He has high-frequency signals in both iliac arteries and monophasic waveforms below that on the right.   Current Meds  Medication Sig  . amLODipine-benazepril (LOTREL) 5-10 MG capsule Take 1 capsule by mouth daily.  Marland Kitchen aspirin 81 MG chewable tablet Chew by mouth daily.  Marland Kitchen atenolol (TENORMIN) 25 MG tablet Take 25 mg by mouth daily.  Marland Kitchen atorvastatin (LIPITOR) 80 MG tablet Take 80 mg by mouth daily.  . finasteride (PROSCAR) 5 MG tablet Take 1 tablet (5 mg total) by mouth daily.  Marland Kitchen gabapentin (NEURONTIN) 100 MG capsule Take 1 capsule (100 mg total) by mouth at bedtime.  . gabapentin (NEURONTIN) 300 MG capsule Take 1 capsule (300 mg total) by mouth 3 (three) times daily.  . isosorbide mononitrate (IMDUR) 60 MG 24 hr tablet Take 60 mg by mouth daily.  . Multiple Vitamin (MULTIVITAMIN) tablet Take 1 tablet by mouth daily.  .  Nitroglycerin (NITROMIST) 400 MCG/SPRAY AERS Place onto the tongue.  Marland Kitchen omeprazole (PRILOSEC) 20 MG capsule Take 20 mg by mouth daily.  . [DISCONTINUED] methylPREDNISolone (MEDROL DOSEPAK) 4 MG TBPK tablet 6 day dose pack - take as directed  . [DISCONTINUED] NEOMYCIN-POLYMYXIN-HYDROCORTISONE (CORTISPORIN) 1 % SOLN OTIC solution Apply 1-2 drops to toe BID after soaking  . [DISCONTINUED] Vitamin D, Ergocalciferol, (DRISDOL) 50000 units CAPS capsule Take 1 capsule (50,000 Units total) by mouth every 7 (seven) days.     No Known Allergies  Social History   Socioeconomic History  . Marital status: Married    Spouse name: Not on file  . Number of children: Not on file  . Years of education: Not on file  . Highest education level: Not on file  Occupational History  . Not on file  Social Needs  . Financial resource strain: Not on file  . Food insecurity:    Worry: Not on file    Inability: Not on file  . Transportation needs:    Medical: Not on file    Non-medical: Not on file  Tobacco Use  . Smoking status: Current Every Day Smoker  . Smokeless tobacco: Never Used  . Tobacco comment: 8-9 cigarettes per day  Substance and Sexual Activity  . Alcohol use: No    Frequency: Never  . Drug use: No  . Sexual activity: Not on file  Lifestyle  . Physical activity:  Days per week: Not on file    Minutes per session: Not on file  . Stress: Not on file  Relationships  . Social connections:    Talks on phone: Not on file    Gets together: Not on file    Attends religious service: Not on file    Active member of club or organization: Not on file    Attends meetings of clubs or organizations: Not on file    Relationship status: Not on file  . Intimate partner violence:    Fear of current or ex partner: Not on file    Emotionally abused: Not on file    Physically abused: Not on file    Forced sexual activity: Not on file  Other Topics Concern  . Not on file  Social History Narrative   . Not on file     Review of Systems: General: negative for chills, fever, night sweats or weight changes.  Cardiovascular: negative for chest pain, dyspnea on exertion, edema, orthopnea, palpitations, paroxysmal nocturnal dyspnea or shortness of breath Dermatological: negative for rash Respiratory: negative for cough or wheezing Urologic: negative for hematuria Abdominal: negative for nausea, vomiting, diarrhea, bright red blood per rectum, melena, or hematemesis Neurologic: negative for visual changes, syncope, or dizziness All other systems reviewed and are otherwise negative except as noted above.    Blood pressure 136/61, pulse (!) 47, height 6\' 3"  (1.905 m), weight 212 lb 12.8 oz (96.5 kg).  General appearance: alert and no distress Neck: no adenopathy, no carotid bruit, no JVD, supple, symmetrical, trachea midline and thyroid not enlarged, symmetric, no tenderness/mass/nodules Lungs: clear to auscultation bilaterally Heart: regular rate and rhythm, S1, S2 normal, no murmur, click, rub or gallop Extremities: extremities normal, atraumatic, no cyanosis or edema Pulses: Absent right femoral pulse Skin: Skin color, texture, turgor normal. No rashes or lesions Neurologic: Alert and oriented X 3, normal strength and tone. Normal symmetric reflexes. Normal coordination and gait  EKG sinus bradycardia at 47 with left axis deviation and right bundle branch block.  I personally reviewed this EKG.  ASSESSMENT AND PLAN:   Atherosclerotic heart disease of native coronary artery without angina pectoris History of CAD status post remote myocardial infarction when he was 73 years old.  He had CABG x[facility] at [facility] in 1990 and redo bypass surgery x3 8 years later.  He is fairly asymptomatic and has not taken any sublingual nitroglycerin in years.  Essential (primary) hypertension History of essential hypertension with blood pressure measured today at 136/61.  He is on  Lotrel and atenolol.  Hyperlipidemia History of hyperlipidemia on statin therapy followed by his PCP  Peripheral arterial disease ([place]) History of right lower extremity claudication with associated numbness.  Dopplers performed 02/13/2019 revealed a right ABI 0.6 and left 0.81.  He has high-frequency signals in his right common iliac artery with monophasic waveforms below this suggesting potentially occluded vessel.  He does not have a right common femoral pulse.  He wishes to proceed with angiography which we will hold off on due to coronavirus.      Lorretta Harp MD FACP,FACC,FAHA, [facility] 02/20/2019 11:01 AM

## 2019-02-20 NOTE — Assessment & Plan Note (Signed)
History of essential hypertension with blood pressure measured today at 136/61.  He is on Lotrel and atenolol.

## 2019-02-20 NOTE — Assessment & Plan Note (Signed)
History of right lower extremity claudication with associated numbness.  Dopplers performed 02/13/2019 revealed a right ABI 0.6 and left 0.81.  He has high-frequency signals in his right common iliac artery with monophasic waveforms below this suggesting potentially occluded vessel.  He does not have a right common femoral pulse.  He wishes to proceed with angiography which we will hold off on due to coronavirus.

## 2019-02-20 NOTE — Patient Instructions (Signed)
Medication Instructions:  Your physician recommends that you continue on your current medications as directed. Please refer to the Current Medication list given to you today.  If you need a refill on your cardiac medications before your next appointment, please call your pharmacy.   Lab work: NONE If you have labs (blood work) drawn today and your tests are completely normal, you will receive your results only by: Marland Kitchen MyChart Message (if you have MyChart) OR . A paper copy in the mail If you have any lab test that is abnormal or we need to change your treatment, we will call you to review the results.  Testing/Procedures: Your physician has requested that you have a carotid duplex. This test is an ultrasound of the carotid arteries in your neck. It looks at blood flow through these arteries that supply the brain with blood. Allow one hour for this exam. There are no restrictions or special instructions.    Follow-Up: At [facility], you and your health needs are our priority.  As part of our continuing mission to provide you with exceptional heart care, we have created designated Provider Care Teams.  These Care Teams include your primary Cardiologist (physician) and Advanced Practice Providers (APPs -  Physician Assistants and Nurse Practitioners) who all work together to provide you with the care you need, when you need it. You will need a follow up appointment in 3 months with Dr. Gwenlyn Found.

## 2019-02-22 ENCOUNTER — Ambulatory Visit: Payer: Medicare Other | Admitting: Family Medicine

## 2019-02-22 ENCOUNTER — Telehealth: Payer: Self-pay | Admitting: Family Medicine

## 2019-02-22 NOTE — Telephone Encounter (Signed)
Copied from [place] [id number]. Topic: Referral - Status >> Feb 22, 2019 11:32 AM Margot Ables wrote: Reason for CRM: Gregory Shepherd said that pt is seeing Gregory Shepherd with cardiology who is following pt for PVD. Pt was seen by Gregory Shepherd 02/04/2019 and ADI was reviewed. Gregory Shepherd is ordering a CT angio but it is on hold due to [place]. Referral to [facility] will be closed.

## 2019-03-15 ENCOUNTER — Ambulatory Visit: Payer: Medicare Other | Admitting: Cardiology

## 2019-04-16 ENCOUNTER — Telehealth (INDEPENDENT_AMBULATORY_CARE_PROVIDER_SITE_OTHER): Payer: Medicare Other | Admitting: Physician Assistant

## 2019-04-16 ENCOUNTER — Telehealth: Payer: Self-pay | Admitting: Cardiovascular Disease

## 2019-04-16 ENCOUNTER — Other Ambulatory Visit: Payer: Self-pay

## 2019-04-16 VITALS — Ht 75.0 in | Wt 200.0 lb

## 2019-04-16 DIAGNOSIS — M79605 Pain in left leg: Secondary | ICD-10-CM

## 2019-04-16 DIAGNOSIS — E785 Hyperlipidemia, unspecified: Secondary | ICD-10-CM

## 2019-04-16 DIAGNOSIS — I10 Essential (primary) hypertension: Secondary | ICD-10-CM

## 2019-04-16 DIAGNOSIS — I739 Peripheral vascular disease, unspecified: Secondary | ICD-10-CM

## 2019-04-16 DIAGNOSIS — I25810 Atherosclerosis of coronary artery bypass graft(s) without angina pectoris: Secondary | ICD-10-CM

## 2019-04-16 DIAGNOSIS — I119 Hypertensive heart disease without heart failure: Secondary | ICD-10-CM | POA: Diagnosis not present

## 2019-04-16 NOTE — Telephone Encounter (Signed)
New Message             Patient is calling today to get a Virtual visit for severe lf leg pain. There are no appointments available until 05/02/19 patient sates he needs to talk to someone before then. Pls call to advise.

## 2019-04-16 NOTE — Telephone Encounter (Signed)
Returned call to patient he stated he has been having pain in both legs.Stated pain in left leg much worse.Stated he knows he needs to have a procedure with Dr.Berry but stated he cannot wait.Stated he has been having pain for the past 1 month,but has become worse.Stated he would like virtual appointment to discuss.Virtual appointment scheduled with Almyra Deforest PA today at 3:30 pm.Patient gives permission for a virtual visit.

## 2019-04-16 NOTE — Progress Notes (Signed)
Virtual Visit via Telephone Note   This visit type was conducted due to national recommendations for restrictions regarding the COVID-19 Pandemic (e.g. social distancing) in an effort to limit this patient's exposure and mitigate transmission in our community.  Due to his co-morbid illnesses, this patient is at least at moderate risk for complications without adequate follow up.  This format is felt to be most appropriate for this patient at this time.  The patient did not have access to video technology/had technical difficulties with video requiring transitioning to audio format only (telephone).  All issues noted in this document were discussed and addressed.  No physical exam could be performed with this format.  Please refer to the patient's chart for his  consent to telehealth for [facility].   Date:  04/18/2019   ID:  Gregory, Shepherd May 25, 1946, MRN [id number]  Patient Location: Home Provider Location: Home  PCP:  Kirk Ruths, MD  Cardiologist:  Quay Burow, MD  Electrophysiologist:  None   Evaluation Performed:  Follow-Up Visit  Chief Complaint:  followup  History of Present Illness:    Gregory Shepherd is a 73 y.o. male with past medical history of hypertension, hyperlipidemia, PAD and CAD s/p CABG.  He had history of 60 pack year tobacco abuse.  He previously underwent bypass surgery at [facility] in 1990, however had a redo bypass surgery x3 at [place] 8 years later.  Fairly recent lower extremity Doppler performed on 02/13/2019 revealed a right ABI of 0.6, left ABI of 0.8.  He had high-frequency signal in both iliac artery and a monophasic waveform below that on the right.  Patient was recently evaluated by Dr. Alvester Chou on 02/20/2019 at which time he complained of right lower extremity claudication with associated numbness.  He wished to proceed with lower extremity angiography, however this was delayed due to coronavirus.  EKG at the time showed sinus bradycardia with  heart rate of 47, right bundle branch block.  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  He was added on for assessment of left lower extremity pain.  Of note, his previous symptom the back in March was clearly claudication symptoms that is worse with ambulation.  However about a week after he saw Dr. Gwenlyn Found, he started noticing left upper thigh pain.  The location of the pain is near the anterolateral side of the left thigh, however the pain can sometimes radiate to the groin.  This is the pain that lasts hours at this time and mainly noticeable when he is at rest.  He does not notice the pain when he walks around.  Talking with Dr. Gwenlyn Found, we suspect that this pain is likely related to pinched nerve in the spine rather than lower extremity arterial disease.  He is due to have a carotid Doppler next month, he is quite worried about arterial disease and wished to have a repeat lower extremity arterial Doppler done at that time.  I think this is quite reasonable.  We did discuss arterial emergencies, he is aware he needs to seek urgent medical attention if he started noticing skin discolorations in the lower extremity or blue discoloration in the toe.  Otherwise I urged him to continue to take Aleve for his anti-inflammatory property.  He is going to get a back injection from  Dr. Tamala Julian to see if this will help with his symptoms.  The patient does not have symptoms concerning for COVID-19 infection (fever, chills, cough, or new shortness of breath).    Past Medical History:  Diagnosis Date   Carotid artery narrowing    Hyperlipidemia    Hypertension    Past Surgical History:  Procedure Laterality Date   CARDIAC SURGERY       Current Meds  Medication Sig   amLODipine-benazepril  (LOTREL) 5-10 MG capsule Take 1 capsule by mouth daily.   aspirin 81 MG chewable tablet Chew by mouth daily.   atenolol (TENORMIN) 25 MG tablet Take 25 mg by mouth daily.   atorvastatin (LIPITOR) 80 MG tablet Take 80 mg by mouth daily.   finasteride (PROSCAR) 5 MG tablet Take 1 tablet (5 mg total) by mouth daily.   gabapentin (NEURONTIN) 100 MG capsule Take 1 capsule (100 mg total) by mouth at bedtime.   gabapentin (NEURONTIN) 300 MG capsule Take 1 capsule (300 mg total) by mouth 3 (three) times daily.   isosorbide mononitrate (IMDUR) 60 MG 24 hr tablet Take 60 mg by mouth daily.   Multiple Vitamin (MULTIVITAMIN) tablet Take 1 tablet by mouth daily.   Nitroglycerin (NITROMIST) 400 MCG/SPRAY AERS Place onto the tongue.   omeprazole (PRILOSEC) 20 MG capsule Take 20 mg by mouth daily.     Allergies:   Patient has no known allergies.   Social History   Tobacco Use   Smoking status: Current Every Day Smoker   Smokeless tobacco: Never Used   Tobacco comment: 8-9 cigarettes per day  Substance Use Topics   Alcohol use: No    Frequency: Never   Drug use: No     Family Hx: The patient's family history is negative for Prostate cancer, Bladder Cancer, and Kidney cancer.  ROS:   Please see the history of present illness.     All other systems reviewed and are negative.   Prior CV studies:   The following studies were reviewed today:  LE doppler 02/13/2019 Summary:  >50% stenosis in the mid and distal aorta.   Right: >50% stenosis noted throughout the common iliac artery. No significant stenosis in the lower extremity.   Left: >50%% stenosis noted in the mid and distal common iliac artery. No significant stenosis in the lower extremity.   Labs/Other Tests and Data Reviewed:    EKG:  An ECG dated 02/20/2019  was personally reviewed today and demonstrated:  Sinus bradycardia.  Recent Labs: No results found for requested labs within last 8760 hours.    Recent Lipid Panel No results found for: CHOL, TRIG, HDL, CHOLHDL, LDLCALC, LDLDIRECT  Wt Readings from Last 3 Encounters:  04/16/19 200 lb (90.7 kg)  02/20/19 212 lb 12.8 oz (96.5 kg)  02/02/19 214 lb (97.1 kg)     Objective:    Vital Signs:  Ht 6\' 3"  (1.905 m)    Wt 200 lb (90.7 kg)    BMI 25.00 kg/m    VITAL SIGNS:  reviewed  ASSESSMENT & PLAN:    1. Left lower extremity pain: We suspect this is more related to pinched nerve rather than claudication symptoms.  Previous Doppler actually shows right lower extremity disease worse than left lower extremity disease.  I will repeat a Doppler prior to the next follow-up  2. Hypertension: Continue on current blood pressure medication  3. Hyperlipidemia: On Lipitor 80 mg daily.  4. PAD: he has known lower extremity disease.  He is  also due for upcoming carotid Doppler  5. CAD status post CABG: Denies any recent chest discomfort or exertional shortness of breath.  COVID-19 Education: The signs and symptoms of COVID-19 were discussed with the patient and how to seek care for testing (follow up with PCP or arrange E-visit).  The importance of social distancing was discussed today.  Time:   Today, I have spent 24 minutes with the patient with telehealth technology discussing the above problems.     Medication Adjustments/Labs and Tests Ordered: Current medicines are reviewed at length with the patient today.  Concerns regarding medicines are outlined above.   Tests Ordered: No orders of the defined types were placed in this encounter.   Medication Changes: No orders of the defined types were placed in this encounter.   Disposition:  Follow up in 2 month(s)  Signed, Almyra Deforest, [place]  04/18/2019 11:58 PM    [facility]

## 2019-04-16 NOTE — Patient Instructions (Addendum)
Medication Instructions:   Your physician recommends that you continue on your current medications as directed. Please refer to the Current Medication list given to you today.  If you need a refill on your cardiac medications before your next appointment, please call your pharmacy.   Lab work:  NONE ordered at this time of appointment   If you have labs (blood work) drawn today and your tests are completely normal, you will receive your results only by: Marland Kitchen MyChart Message (if you have MyChart) OR . A paper copy in the mail If you have any lab test that is abnormal or we need to change your treatment, we will call you to review the results.  Testing/Procedures: Your physician has requested that you have a lower extremity arterial exercise duplex. During this test, exercise and ultrasound are used to evaluate arterial blood flow in the legs. Allow one hour for this exam. There are no restrictions or special instructions.  Follow-Up: . Your physician recommends that you keep your scheduled follow-up appointment in June 2020 with Dr. Gwenlyn Found  Any Other Special Instructions Will Be Listed Below (If Applicable).

## 2019-04-17 ENCOUNTER — Encounter: Payer: Self-pay | Admitting: Family Medicine

## 2019-04-24 ENCOUNTER — Ambulatory Visit
Admission: RE | Admit: 2019-04-24 | Discharge: 2019-04-24 | Disposition: A | Discharge status: Home / Self Care | Payer: Medicare Other | Source: Ambulatory Visit | Attending: Family Medicine | Admitting: Family Medicine

## 2019-04-24 ENCOUNTER — Other Ambulatory Visit: Payer: Self-pay

## 2019-04-24 DIAGNOSIS — M48062 Spinal stenosis, lumbar region with neurogenic claudication: Secondary | ICD-10-CM

## 2019-04-24 MED ORDER — METHYLPREDNISOLONE ACETATE 40 MG/ML INJ SUSP (RADIOLOG
120.0000 mg | Freq: Once | INTRAMUSCULAR | Status: AC
  Administered 2019-04-24: 120 mg via EPIDURAL

## 2019-04-24 MED ORDER — IOPAMIDOL (ISOVUE-M 300) INJECTION 61%
1.0000 mL | Freq: Once | INTRAMUSCULAR | Status: AC | PRN
  Administered 2019-04-24: 14:00:00 1 mL via EPIDURAL

## 2019-04-24 NOTE — Discharge Instructions (Signed)

## 2019-05-10 ENCOUNTER — Other Ambulatory Visit: Payer: Self-pay | Admitting: Physician Assistant

## 2019-05-10 DIAGNOSIS — I739 Peripheral vascular disease, unspecified: Secondary | ICD-10-CM

## 2019-05-15 ENCOUNTER — Other Ambulatory Visit: Payer: Self-pay

## 2019-05-15 ENCOUNTER — Other Ambulatory Visit: Payer: Self-pay | Admitting: Physician Assistant

## 2019-05-15 ENCOUNTER — Ambulatory Visit (HOSPITAL_COMMUNITY)
Admission: RE | Admit: 2019-05-15 | Discharge: 2019-05-15 | Disposition: A | Discharge status: Home / Self Care | Payer: Medicare Other | Source: Ambulatory Visit | Attending: Internal Medicine | Admitting: Internal Medicine

## 2019-05-15 ENCOUNTER — Other Ambulatory Visit: Payer: Self-pay | Admitting: Cardiovascular Disease

## 2019-05-15 ENCOUNTER — Ambulatory Visit (HOSPITAL_BASED_OUTPATIENT_CLINIC_OR_DEPARTMENT_OTHER)
Admission: RE | Admit: 2019-05-15 | Discharge: 2019-05-15 | Disposition: A | Discharge status: Home / Self Care | Payer: Medicare Other | Source: Ambulatory Visit | Attending: Physician Assistant | Admitting: Physician Assistant

## 2019-05-15 DIAGNOSIS — M79605 Pain in left leg: Secondary | ICD-10-CM

## 2019-05-15 DIAGNOSIS — I6523 Occlusion and stenosis of bilateral carotid arteries: Secondary | ICD-10-CM

## 2019-05-15 DIAGNOSIS — I739 Peripheral vascular disease, unspecified: Secondary | ICD-10-CM | POA: Diagnosis not present

## 2019-05-23 ENCOUNTER — Telehealth: Payer: Self-pay

## 2019-05-23 ENCOUNTER — Ambulatory Visit: Payer: Medicare Other | Admitting: Cardiovascular Disease

## 2019-05-23 ENCOUNTER — Other Ambulatory Visit: Payer: Self-pay

## 2019-05-23 ENCOUNTER — Encounter: Payer: Self-pay | Admitting: Cardiovascular Disease

## 2019-05-23 DIAGNOSIS — I10 Essential (primary) hypertension: Secondary | ICD-10-CM

## 2019-05-23 DIAGNOSIS — I739 Peripheral vascular disease, unspecified: Secondary | ICD-10-CM

## 2019-05-23 DIAGNOSIS — E782 Mixed hyperlipidemia: Secondary | ICD-10-CM

## 2019-05-23 DIAGNOSIS — I2510 Atherosclerotic heart disease of native coronary artery without angina pectoris: Secondary | ICD-10-CM | POA: Diagnosis not present

## 2019-05-23 NOTE — Progress Notes (Signed)
05/23/2019 [name]   11/23/1946  [id number]  Primary Physician Kirk Ruths, MD Primary Cardiologist: Lorretta Harp MD FACP, [place], [place], [place]  HPI:  Gregory Shepherd is a 73 y.o.  mild to moderately overweight married Caucasian male father of 2, grandfather of 3 grandchildren who I last saw in the office 02/20/2019. He was referred by Dr. Gardenia Phlegm for peripheral vascular valuation because of claudication.  He is retired from being in [employer]  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  He has treated hypertension hyperlipidemia.  His father did have a myocardial infarction.  He had CABG x2 at [facility] in [place] by Dr. Eliberto Ivory.  He did have a remote MI at age 47 as well.  He had redo bypass surgery x3 at [place] 8 years later.    His major issue is with left lower extremity numbness and claudication.  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  He has high-frequency signals in both iliac arteries and monophasic waveforms below that on the right.  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  He continues to have right lower extremity claudication.  He wishes to proceed with angiography and potential endovascular therapy.  He denies chest pain or shortness of breath.  Current Meds  Medication Sig  . amLODipine-benazepril (LOTREL) 5-10 MG capsule Take 1 capsule by mouth daily.  Marland Kitchen aspirin 81 MG chewable tablet Chew by mouth daily.  Marland Kitchen atenolol (TENORMIN) 25 MG tablet Take 25 mg by mouth daily.  Marland Kitchen atorvastatin (LIPITOR) 80 MG tablet Take 80 mg by mouth daily.  . finasteride (PROSCAR) 5 MG tablet Take 1 tablet (5 mg total) by mouth daily.  Marland Kitchen gabapentin (NEURONTIN) 300 MG capsule Take 1 capsule (300 mg total) by  mouth 3 (three) times daily.  . isosorbide mononitrate (IMDUR) 60 MG 24 hr tablet Take 60 mg by mouth daily.  . Multiple Vitamin (MULTIVITAMIN) tablet Take 1 tablet by mouth daily.  . Nitroglycerin (NITROMIST) 400 MCG/SPRAY AERS Place onto the tongue.  Marland Kitchen omeprazole (PRILOSEC) 20 MG capsule Take 20 mg by mouth daily.     No Known Allergies  Social History   Socioeconomic History  . Marital status: Married    Spouse name: Not on file  . Number of children: Not on file  . Years of education: Not on file  . Highest education level: Not on file  Occupational History  . Not on file  Social Needs  . Financial resource strain: Not on file  . Food insecurity    Worry: Not on file    Inability: Not on file  . Transportation needs    Medical: Not on file    Non-medical: Not on file  Tobacco Use  . Smoking status: Current Every Day Smoker  . Smokeless tobacco: Never Used  . Tobacco comment: 8-9 cigarettes per day  Substance and Sexual Activity  . Alcohol use: No    Frequency: Never  . Drug use: No  . Sexual activity: Not on file  Lifestyle  . Physical activity    Days per week: Not on file    Minutes per session: Not on file  . Stress: Not on file  Relationships  . Social Herbalist on phone: Not on file    Gets together: Not on file    Attends religious service: Not on file    Active member of club or organization: Not on file    Attends meetings of clubs or organizations: Not on file    Relationship status: Not on file  . Intimate partner violence    Fear of current or ex partner: Not on file    Emotionally abused: Not on file    Physically abused: Not on file    Forced sexual activity: Not on file  Other Topics Concern  . Not on file  Social History Narrative  . Not on file     Review of Systems: General: negative for chills, fever, night sweats or weight changes.  Cardiovascular: negative for chest pain, dyspnea on exertion, edema, orthopnea,  palpitations, paroxysmal nocturnal dyspnea or shortness of breath Dermatological: negative for rash Respiratory: negative for cough or wheezing Urologic: negative for hematuria Abdominal: negative for nausea, vomiting, diarrhea, bright red blood per rectum, melena, or hematemesis Neurologic: negative for visual changes, syncope, or dizziness All other systems reviewed and are otherwise negative except as noted above.    Blood pressure (!) 130/50, pulse (!) 55, temperature 98.7 F (37.1 C), height 6\' 3"  (1.905 m), weight 205 lb (93 kg).  General appearance: alert Neck: no adenopathy, no carotid bruit, no JVD, supple, symmetrical, trachea midline and thyroid not enlarged, symmetric, no tenderness/mass/nodules Lungs: clear to auscultation bilaterally Heart: regular rate and rhythm, S1, S2 normal, no murmur, click, rub or gallop Extremities: extremities normal, atraumatic, no cyanosis or edema Pulses: 2+ and symmetric Skin: Skin color, texture, turgor normal. No rashes or lesions Neurologic: Alert and oriented X 3, normal strength and tone. Normal symmetric reflexes. Normal coordination and gait  EKG not performed today  ASSESSMENT AND PLAN:   Atherosclerotic heart disease of native coronary artery without angina pectoris History of CAD status post CABG x2 at [facility] in 1990 by Dr. Eliberto Ivory.  He had redo CABG x3 8 years later.  He denies chest pain or shortness of breath.  Essential (primary) hypertension History of essential hypertension her blood pressure measured today 130/50.  He is on amlodipine, benazepril and atenolol.  Hyperlipidemia History of hyperlipidemia on high-dose statin therapy followed by his PCP  Peripheral arterial disease ([place]) History of peripheral arterial disease with Dopplers performed 02/13/2019 revealing a right ABI 0.6, left ABI 0.8 with what appears to be iliac disease.  He is symptomatic.  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  We have decided to proceed with angiography and potential endovascular therapy for lifestyle limiting claudication.      Lorretta Harp MD FACP,FACC,FAHA, [facility] 05/23/2019 11:47 AM

## 2019-05-23 NOTE — Patient Instructions (Addendum)
[place] [address] Dept: [phone] Loc: [place]  05/23/2019  You are scheduled for a Peripheral Angiogram on Monday, June 29 with Dr. Quay Burow.  1. Please arrive at the [facility] (Main Entrance A) at [facility]: [address] [place], [place] [address] at 7:30 AM (This time is two hours before your procedure to ensure your preparation). Free valet parking service is available.   Special note: Every effort is made to have your procedure done on time. Please understand that emergencies sometimes delay scheduled procedures.  2. Diet: Do not eat solid foods after midnight.  The patient may have clear liquids until 5am upon the day of the procedure.  3. Labs: You will need to have blood drawn and a COVID-19 test done on 05/30/2019.  Go to:  [facility] at [address] [address] [facility] [id number], [place], [place] [address] FOR YOUR BASIC METABOLIC PANEL, COMPLETE BLOOD COUNT, AND THYROID STIMULATING HORMONE LAB WORK. NO APPOINTMENT IS NEEDED. YOU MUST HAVE THIS LAB WORK DONE BEFORE GOING TO GET YOUR COVID-19 TEST DONE BECAUSE YOU WILL NEED TO QUARANTINE YOURSELF AFTER THE COVID-19 TEST UNTIL THE DAY OF YOUR [place].  Go to: [facility]  [address] [place], [place] [address] FOR YOUR COVID-19 TEST. YOU MUST HAVE YOUR COVID-19 TEST COMPLETED 3 DAYS PRIOR TO YOUR UPCOMING PROCEDURE/TEST. YOUR COVID-19 APPOINTMENT IS SCHEDULED FOR 05/30/2019 AT 10:00AM. YOU WILL ALSO NEED TO QUARANTINE YOURSELF AFTER THE COVID-19 TEST UNTIL THE DAY OF YOUR PROCEDURE/TEST. RESULTS WILL  BE POSTED IN OUR SYSTEM IN 48 HOURS OR LESS.  4. Medication instructions in preparation for your procedure:  On the morning of your procedure, take your Aspirin and any morning medicines NOT listed above.  You may use  sips of water.  5. Plan for one night stay--bring personal belongings. 6. Bring a current list of your medications and current insurance cards. 7. You MUST have a responsible person to drive you home. 8. Someone MUST be with you the first 24 hours after you arrive home or your discharge will be delayed. 9. Please wear clothes that are easy to get on and off and wear slip-on shoes.   Testing/Procedures: Your physician has requested that you have a lower or upper extremity arterial duplex. This test is an ultrasound of the arteries in the legs or arms. It looks at arterial blood flow in the legs and arms. Allow one hour for Lower and Upper Arterial scans. There are no restrictions or special instructions TO BE SCHEDULED 2-3 WEEKS AFTER YOUR PROCEDURE. YOU WILL BE CONTACTED BY A SCHEDULER TO SET UP THIS APPOINTMENT.  Your physician has requested that you have an ankle brachial index (ABI). During this test an ultrasound and blood pressure cuff are used to evaluate the arteries that supply the arms and legs with blood. Allow thirty minutes for this exam. There are no restrictions or special instructions. TO BE SCHEDULED 2-3 WEEKS AFTER YOUR PROCEDURE. YOU WILL BE CONTACTED BY A SCHEDULER TO SET UP THIS APPOINTMENT.  Follow-Up: At [facility], you and your health needs are our priority.  As part of our continuing mission to provide you with exceptional heart care, we have created designated Provider Care Teams.  These Care Teams include your primary Cardiologist (physician) and Advanced Practice Providers (APPs -  Physician Assistants and Nurse Practitioners) who all  work together to provide you with the care you need, when you need it. You will need a follow up appointment in 3 weeks with Dr. Gwenlyn Found. PLEASE HAVE YOUR ULTRASOUNDS DONE BEFORE THIS APPOINTMENT.    Thank you for allowing [place] to care for you!   --  Invasive Cardiovascular services

## 2019-05-23 NOTE — Assessment & Plan Note (Signed)
History of essential hypertension her blood pressure measured today 130/50.  He is on amlodipine, benazepril and atenolol.

## 2019-05-23 NOTE — Assessment & Plan Note (Signed)
History of peripheral arterial disease with Dopplers performed 02/13/2019 revealing a right ABI 0.6, left ABI 0.8 with what appears to be iliac disease.  He is symptomatic.  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  We have decided to proceed with angiography and potential endovascular therapy for lifestyle limiting claudication.

## 2019-05-23 NOTE — Assessment & Plan Note (Signed)
History of hyperlipidemia on high-dose statin therapy followed by his PCP. 

## 2019-05-23 NOTE — Assessment & Plan Note (Signed)
History of CAD status post CABG x2 at [facility] in 1990 by Dr. Eliberto Ivory.  He had redo CABG x3 8 years later.  He denies chest pain or shortness of breath.

## 2019-05-23 NOTE — H&P (View-Only) (Signed)
05/23/2019 [name]   1946/11/18  [id number]  Primary Physician Kirk Ruths, MD Primary Cardiologist: Lorretta Harp MD FACP, [place], [place], [place]  HPI:  Gregory Shepherd is a 73 y.o.  mild to moderately overweight married Caucasian male father of 2, grandfather of 3 grandchildren who I last saw in the office 02/20/2019. He was referred by Dr. Gardenia Phlegm for peripheral vascular valuation because of claudication.  He is retired from being in [employer]  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  He has treated hypertension hyperlipidemia.  His father did have a myocardial infarction.  He had CABG x2 at [facility] in [place] by Dr. Eliberto Ivory.  He did have a remote MI at age 79 as well.  He had redo bypass surgery x3 at [place] 8 years later.    His major issue is with left lower extremity numbness and claudication.  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  He has high-frequency signals in both iliac arteries and monophasic waveforms below that on the right.  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  He continues to have right lower extremity claudication.  He wishes to proceed with angiography and potential endovascular therapy.  He denies chest pain or shortness of breath.  Current Meds  Medication Sig  . amLODipine-benazepril (LOTREL) 5-10 MG capsule Take 1 capsule by mouth daily.  Marland Kitchen aspirin 81 MG chewable tablet Chew by mouth daily.  Marland Kitchen atenolol (TENORMIN) 25 MG tablet Take 25 mg by mouth daily.  Marland Kitchen atorvastatin (LIPITOR) 80 MG tablet Take 80 mg by mouth daily.  . finasteride (PROSCAR) 5 MG tablet Take 1 tablet (5 mg total) by mouth daily.  Marland Kitchen gabapentin (NEURONTIN) 300 MG capsule Take 1 capsule (300 mg total) by  mouth 3 (three) times daily.  . isosorbide mononitrate (IMDUR) 60 MG 24 hr tablet Take 60 mg by mouth daily.  . Multiple Vitamin (MULTIVITAMIN) tablet Take 1 tablet by mouth daily.  . Nitroglycerin (NITROMIST) 400 MCG/SPRAY AERS Place onto the tongue.  Marland Kitchen omeprazole (PRILOSEC) 20 MG capsule Take 20 mg by mouth daily.     No Known Allergies  Social History   Socioeconomic History  . Marital status: Married    Spouse name: Not on file  . Number of children: Not on file  . Years of education: Not on file  . Highest education level: Not on file  Occupational History  . Not on file  Social Needs  . Financial resource strain: Not on file  . Food insecurity    Worry: Not on file    Inability: Not on file  . Transportation needs    Medical: Not on file    Non-medical: Not on file  Tobacco Use  . Smoking status: Current Every Day Smoker  . Smokeless tobacco: Never Used  . Tobacco comment: 8-9 cigarettes per day  Substance and Sexual Activity  . Alcohol use: No    Frequency: Never  . Drug use: No  . Sexual activity: Not on file  Lifestyle  . Physical activity    Days per week: Not on file    Minutes per session: Not on file  . Stress: Not on file  Relationships  . Social Herbalist on phone: Not on file    Gets together: Not on file    Attends religious service: Not on file    Active member of club or organization: Not on file    Attends meetings of clubs or organizations: Not on file    Relationship status: Not on file  . Intimate partner violence    Fear of current or ex partner: Not on file    Emotionally abused: Not on file    Physically abused: Not on file    Forced sexual activity: Not on file  Other Topics Concern  . Not on file  Social History Narrative  . Not on file     Review of Systems: General: negative for chills, fever, night sweats or weight changes.  Cardiovascular: negative for chest pain, dyspnea on exertion, edema, orthopnea,  palpitations, paroxysmal nocturnal dyspnea or shortness of breath Dermatological: negative for rash Respiratory: negative for cough or wheezing Urologic: negative for hematuria Abdominal: negative for nausea, vomiting, diarrhea, bright red blood per rectum, melena, or hematemesis Neurologic: negative for visual changes, syncope, or dizziness All other systems reviewed and are otherwise negative except as noted above.    Blood pressure (!) 130/50, pulse (!) 55, temperature 98.7 F (37.1 C), height 6\' 3"  (1.905 m), weight 205 lb (93 kg).  General appearance: alert Neck: no adenopathy, no carotid bruit, no JVD, supple, symmetrical, trachea midline and thyroid not enlarged, symmetric, no tenderness/mass/nodules Lungs: clear to auscultation bilaterally Heart: regular rate and rhythm, S1, S2 normal, no murmur, click, rub or gallop Extremities: extremities normal, atraumatic, no cyanosis or edema Pulses: 2+ and symmetric Skin: Skin color, texture, turgor normal. No rashes or lesions Neurologic: Alert and oriented X 3, normal strength and tone. Normal symmetric reflexes. Normal coordination and gait  EKG not performed today  ASSESSMENT AND PLAN:   Atherosclerotic heart disease of native coronary artery without angina pectoris History of CAD status post CABG x2 at [facility] in 1990 by Dr. Eliberto Ivory.  He had redo CABG x3 8 years later.  He denies chest pain or shortness of breath.  Essential (primary) hypertension History of essential hypertension her blood pressure measured today 130/50.  He is on amlodipine, benazepril and atenolol.  Hyperlipidemia History of hyperlipidemia on high-dose statin therapy followed by his PCP  Peripheral arterial disease ([place]) History of peripheral arterial disease with Dopplers performed 02/13/2019 revealing a right ABI 0.6, left ABI 0.8 with what appears to be iliac disease.  He is symptomatic.  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  We have decided to proceed with angiography and potential endovascular therapy for lifestyle limiting claudication.      Lorretta Harp MD FACP,FACC,FAHA, [facility] 05/23/2019 11:47 AM

## 2019-05-23 NOTE — Telephone Encounter (Signed)
Pt AVS from office visit 05/23/2019 sent to pt via mychart per request. Nurse also mailed an extra copy on 6/17 to pt address on file

## 2019-05-24 ENCOUNTER — Telehealth: Payer: Self-pay | Admitting: *Deleted

## 2019-05-24 ENCOUNTER — Telehealth: Payer: Self-pay | Admitting: Cardiovascular Disease

## 2019-05-24 NOTE — Telephone Encounter (Signed)
Spoke with patient regarding post angiogram  f/u dopplers and f/u appt with Dr. Gwenlyn Found 06/21/19 at 1:00pm for dopplers and 07/10/19 at 2:00pm.  Patient voiced his understanding

## 2019-05-24 NOTE — Telephone Encounter (Signed)
Patient received mychart message to call office regarding angiogram appt

## 2019-05-24 NOTE — Telephone Encounter (Signed)
Returned call to patient he stated he a a couple of questions about upcoming angiogram.Advised I will send message to Dr.Berry's RN.

## 2019-05-25 NOTE — Telephone Encounter (Signed)
Spoke with pt who wanted to know if he could present for lab work on 6/23 rather than 6/24. Informed pt that it is okay for him to do so since he does not need an appointment. Pt plans to present for lab work on 6/23. No further questions

## 2019-05-29 ENCOUNTER — Other Ambulatory Visit: Payer: Self-pay

## 2019-05-29 DIAGNOSIS — Z01812 Encounter for preprocedural laboratory examination: Secondary | ICD-10-CM

## 2019-05-29 LAB — BASIC METABOLIC PANEL
BUN/Creatinine Ratio: 12 (ref 10–24)
BUN: 11 mg/dL (ref 8–27)
CO2: 22 mmol/L (ref 20–29)
Calcium: 9.5 mg/dL (ref 8.6–10.2)
Chloride: 103 mmol/L (ref 96–106)
Creatinine, Ser: 0.93 mg/dL (ref 0.76–1.27)
GFR calc Af Amer: 94 mL/min/{1.73_m2} (ref >59)
GFR calc non Af Amer: 81 mL/min/{1.73_m2} (ref >59)
Glucose: 92 mg/dL (ref 65–99)
Potassium: 4.8 mmol/L (ref 3.5–5.2)
Sodium: 139 mmol/L (ref 134–144)

## 2019-05-29 LAB — CBC
Hematocrit: 43.3 % (ref 37.5–51.0)
Hemoglobin: 14.7 g/dL (ref 13.0–17.7)
MCH: 29.9 pg (ref 26.6–33.0)
MCHC: 33.9 g/dL (ref 31.5–35.7)
MCV: 88 fL (ref 79–97)
Platelets: 191 10*3/uL (ref 150–450)
RBC: 4.92 x10E6/uL (ref 4.14–5.80)
RDW: 13.4 % (ref 11.6–15.4)
WBC: 6.9 10*3/uL (ref 3.4–10.8)

## 2019-05-29 LAB — TSH: TSH: 6.66 u[IU]/mL — ABNORMAL HIGH (ref 0.450–4.500)

## 2019-05-30 ENCOUNTER — Other Ambulatory Visit: Payer: Self-pay

## 2019-05-30 ENCOUNTER — Other Ambulatory Visit (HOSPITAL_COMMUNITY): Payer: Medicare Other

## 2019-05-30 DIAGNOSIS — I739 Peripheral vascular disease, unspecified: Secondary | ICD-10-CM

## 2019-05-31 ENCOUNTER — Telehealth: Payer: Self-pay | Admitting: *Deleted

## 2019-05-31 NOTE — Telephone Encounter (Signed)
Pt contacted pre-abdominal aortogram  scheduled at [facility] for: Monday June 04, 2019 9:30 AM Verified arrival time and place: [place] Entrance A at: 7:30 AM  Covid-19 test date: 06/01/19  No solid food after midnight prior to cath, clear liquids until 5 AM day of procedure. Contrast allergy: no  AM meds can be  taken pre-cath with sip of water including: ASA 81 mg  Confirmed patient has responsible person to drive home post procedure and observe 24 hours after arriving home: yes  Due to Covid-19 pandemic no visitors are allowed in the hospital (unless cognitive impairment).  Their designated party will be called when their procedure is over for an update and to arrange pick up.  Patients are required to wear a mask when they enter the hospital.      COVID-19 Pre-Screening Questions:  . In the past 7 to 10 days have you had a cough,  shortness of breath, headache, congestion, fever (100 or greater) body aches, chills, sore throat, or sudden loss of taste or sense of smell? no . Have you been around anyone with known Covid 19? no . Have you been around anyone who is awaiting Covid 19 test results in the past 7 to 10 days? no . Have you been around anyone who has been exposed to Covid 19, or has mentioned symptoms of Covid 19 within the past 7 to 10 days? no  I reviewed procedure instructions, mask/visitor/Covid-19 screening questions with patient, he verbalized understanding, thanked me for call.

## 2019-06-01 ENCOUNTER — Other Ambulatory Visit (HOSPITAL_COMMUNITY)
Admission: RE | Admit: 2019-06-01 | Discharge: 2019-06-01 | Disposition: A | Discharge status: Home / Self Care | Payer: Medicare Other | Source: Ambulatory Visit | Attending: Cardiovascular Disease | Admitting: Cardiovascular Disease

## 2019-06-01 DIAGNOSIS — Z01818 Encounter for other preprocedural examination: Secondary | ICD-10-CM | POA: Insufficient documentation

## 2019-06-01 DIAGNOSIS — Z1159 Encounter for screening for other viral diseases: Secondary | ICD-10-CM | POA: Insufficient documentation

## 2019-06-01 DIAGNOSIS — I739 Peripheral vascular disease, unspecified: Secondary | ICD-10-CM | POA: Diagnosis not present

## 2019-06-01 LAB — SARS CORONAVIRUS 2 (TAT 6-24 HRS): SARS Coronavirus 2: NEGATIVE

## 2019-06-04 ENCOUNTER — Ambulatory Visit (HOSPITAL_COMMUNITY)
Admission: RE | Admit: 2019-06-04 | Discharge: 2019-06-05 | Disposition: A | Discharge status: Home / Self Care | Payer: Medicare Other | Attending: Cardiovascular Disease | Admitting: Cardiovascular Disease

## 2019-06-04 ENCOUNTER — Other Ambulatory Visit: Payer: Self-pay

## 2019-06-04 ENCOUNTER — Encounter (HOSPITAL_COMMUNITY)
Admission: RE | Disposition: A | Discharge status: Home / Self Care | Payer: Self-pay | Source: Home / Self Care | Attending: Cardiovascular Disease

## 2019-06-04 DIAGNOSIS — I70203 Unspecified atherosclerosis of native arteries of extremities, bilateral legs: Secondary | ICD-10-CM | POA: Diagnosis not present

## 2019-06-04 DIAGNOSIS — I739 Peripheral vascular disease, unspecified: Secondary | ICD-10-CM | POA: Diagnosis not present

## 2019-06-04 DIAGNOSIS — I252 Old myocardial infarction: Secondary | ICD-10-CM | POA: Diagnosis not present

## 2019-06-04 DIAGNOSIS — I10 Essential (primary) hypertension: Secondary | ICD-10-CM | POA: Diagnosis not present

## 2019-06-04 DIAGNOSIS — Z79899 Other long term (current) drug therapy: Secondary | ICD-10-CM | POA: Insufficient documentation

## 2019-06-04 DIAGNOSIS — I70213 Atherosclerosis of native arteries of extremities with intermittent claudication, bilateral legs: Secondary | ICD-10-CM | POA: Insufficient documentation

## 2019-06-04 DIAGNOSIS — E785 Hyperlipidemia, unspecified: Secondary | ICD-10-CM | POA: Diagnosis not present

## 2019-06-04 DIAGNOSIS — F17210 Nicotine dependence, cigarettes, uncomplicated: Secondary | ICD-10-CM | POA: Insufficient documentation

## 2019-06-04 DIAGNOSIS — Z7982 Long term (current) use of aspirin: Secondary | ICD-10-CM | POA: Diagnosis not present

## 2019-06-04 DIAGNOSIS — Z951 Presence of aortocoronary bypass graft: Secondary | ICD-10-CM | POA: Insufficient documentation

## 2019-06-04 DIAGNOSIS — I2510 Atherosclerotic heart disease of native coronary artery without angina pectoris: Secondary | ICD-10-CM | POA: Insufficient documentation

## 2019-06-04 HISTORY — PX: PERIPHERAL VASCULAR ATHERECTOMY: CATH118256

## 2019-06-04 HISTORY — PX: PERIPHERAL VASCULAR INTERVENTION: CATH118257

## 2019-06-04 HISTORY — PX: ABDOMINAL AORTOGRAM W/LOWER EXTREMITY: CATH118223

## 2019-06-04 LAB — POCT ACTIVATED CLOTTING TIME
Activated Clotting Time: 241 seconds
Activated Clotting Time: 257 seconds
Activated Clotting Time: 235 seconds
Activated Clotting Time: 219 seconds
Activated Clotting Time: 180 seconds

## 2019-06-04 SURGERY — ABDOMINAL AORTOGRAM W/LOWER EXTREMITY
Anesthesia: LOCAL | Laterality: Right

## 2019-06-04 MED ORDER — ASPIRIN EC 81 MG PO TBEC
81.0000 mg | DELAYED_RELEASE_TABLET | Freq: Every day | ORAL | Status: DC
  Administered 2019-06-05: 81 mg via ORAL
  Filled 2019-06-04: qty 1

## 2019-06-04 MED ORDER — LIDOCAINE HCL (PF) 1 % IJ SOLN
INTRAMUSCULAR | Status: DC | PRN
  Administered 2019-06-04: 25 mL via INTRADERMAL

## 2019-06-04 MED ORDER — HYDRALAZINE HCL 20 MG/ML IJ SOLN
5.0000 mg | INTRAMUSCULAR | Status: DC | PRN
  Administered 2019-06-04: 5 mg via INTRAVENOUS
  Filled 2019-06-04: qty 1

## 2019-06-04 MED ORDER — SODIUM CHLORIDE 0.9 % WEIGHT BASED INFUSION
3.0000 mL/kg/h | INTRAVENOUS | Status: DC
  Administered 2019-06-04: 3 mL/kg/h via INTRAVENOUS

## 2019-06-04 MED ORDER — FINASTERIDE 5 MG PO TABS
5.0000 mg | ORAL_TABLET | Freq: Every day | ORAL | Status: DC
  Administered 2019-06-04 – 2019-06-05 (×2): 5 mg via ORAL
  Filled 2019-06-04 (×2): qty 1

## 2019-06-04 MED ORDER — HEPARIN SODIUM (PORCINE) 1000 UNIT/ML IJ SOLN
INTRAMUSCULAR | Status: AC
  Filled 2019-06-04: qty 1

## 2019-06-04 MED ORDER — SODIUM CHLORIDE 0.9% FLUSH: 3.0000 mL | INTRAVENOUS | Status: DC | PRN

## 2019-06-04 MED ORDER — SODIUM CHLORIDE 0.9 % IV SOLN: INTRAVENOUS | Status: AC

## 2019-06-04 MED ORDER — SODIUM CHLORIDE 0.9% FLUSH: 3.0000 mL | Freq: Two times a day (BID) | INTRAVENOUS | Status: DC

## 2019-06-04 MED ORDER — BENAZEPRIL HCL 10 MG PO TABS
10.0000 mg | ORAL_TABLET | Freq: Every day | ORAL | Status: DC
  Administered 2019-06-04 – 2019-06-05 (×2): 10 mg via ORAL
  Filled 2019-06-04 (×2): qty 1

## 2019-06-04 MED ORDER — SODIUM CHLORIDE 0.9 % IV SOLN: 250.0000 mL | INTRAVENOUS | Status: DC | PRN

## 2019-06-04 MED ORDER — HYDRALAZINE HCL 20 MG/ML IJ SOLN
INTRAMUSCULAR | Status: DC | PRN
  Administered 2019-06-04 (×2): 10 mg via INTRAVENOUS

## 2019-06-04 MED ORDER — ISOSORBIDE MONONITRATE ER 60 MG PO TB24
60.0000 mg | ORAL_TABLET | Freq: Every day | ORAL | Status: DC
  Administered 2019-06-05: 60 mg via ORAL
  Filled 2019-06-04: qty 1

## 2019-06-04 MED ORDER — ALPRAZOLAM 0.25 MG PO TABS
0.2500 mg | ORAL_TABLET | Freq: Four times a day (QID) | ORAL | Status: DC | PRN
  Administered 2019-06-04 – 2019-06-05 (×2): 0.5 mg via ORAL
  Filled 2019-06-04 (×2): qty 2

## 2019-06-04 MED ORDER — HYDRALAZINE HCL 20 MG/ML IJ SOLN
INTRAMUSCULAR | Status: AC
  Filled 2019-06-04: qty 1

## 2019-06-04 MED ORDER — ACETAMINOPHEN 325 MG PO TABS: 650.0000 mg | ORAL_TABLET | ORAL | Status: DC | PRN

## 2019-06-04 MED ORDER — NITROGLYCERIN 1 MG/10 ML FOR IR/CATH LAB
INTRA_ARTERIAL | Status: DC | PRN
  Administered 2019-06-04: 200 ug via INTRA_ARTERIAL

## 2019-06-04 MED ORDER — ATENOLOL 25 MG PO TABS
25.0000 mg | ORAL_TABLET | Freq: Every day | ORAL | Status: DC
  Administered 2019-06-05: 25 mg via ORAL
  Filled 2019-06-04: qty 1

## 2019-06-04 MED ORDER — ASPIRIN 81 MG PO CHEW: 81.0000 mg | CHEWABLE_TABLET | ORAL | Status: DC

## 2019-06-04 MED ORDER — LABETALOL HCL 5 MG/ML IV SOLN
10.0000 mg | INTRAVENOUS | Status: DC | PRN
  Administered 2019-06-04 (×2): 10 mg via INTRAVENOUS

## 2019-06-04 MED ORDER — CLOPIDOGREL BISULFATE 300 MG PO TABS
ORAL_TABLET | ORAL | Status: AC
  Filled 2019-06-04: qty 1

## 2019-06-04 MED ORDER — ONDANSETRON HCL 4 MG/2ML IJ SOLN: 4.0000 mg | Freq: Four times a day (QID) | INTRAMUSCULAR | Status: DC | PRN

## 2019-06-04 MED ORDER — LABETALOL HCL 5 MG/ML IV SOLN
INTRAVENOUS | Status: AC
  Filled 2019-06-04: qty 4

## 2019-06-04 MED ORDER — LIDOCAINE HCL (PF) 1 % IJ SOLN
INTRAMUSCULAR | Status: AC
  Filled 2019-06-04: qty 30

## 2019-06-04 MED ORDER — VIPERSLIDE LUBRICANT OPTIME
TOPICAL | Status: DC | PRN
  Administered 2019-06-04: 50 mL via SURGICAL_CAVITY

## 2019-06-04 MED ORDER — CLOPIDOGREL BISULFATE 300 MG PO TABS
ORAL_TABLET | ORAL | Status: DC | PRN
  Administered 2019-06-04: 300 mg via ORAL

## 2019-06-04 MED ORDER — ASPIRIN 81 MG PO CHEW: 81.0000 mg | CHEWABLE_TABLET | Freq: Every day | ORAL | Status: DC

## 2019-06-04 MED ORDER — NITROGLYCERIN IN D5W 200-5 MCG/ML-% IV SOLN
INTRAVENOUS | Status: AC
  Filled 2019-06-04: qty 250

## 2019-06-04 MED ORDER — AMLODIPINE BESY-BENAZEPRIL HCL 5-10 MG PO CAPS: 1.0000 | ORAL_CAPSULE | Freq: Every day | ORAL | Status: DC

## 2019-06-04 MED ORDER — ATORVASTATIN CALCIUM 80 MG PO TABS: 80.0000 mg | ORAL_TABLET | Freq: Every day | ORAL | Status: DC

## 2019-06-04 MED ORDER — CLOPIDOGREL BISULFATE 75 MG PO TABS
75.0000 mg | ORAL_TABLET | Freq: Every day | ORAL | Status: DC
  Administered 2019-06-05: 75 mg via ORAL
  Filled 2019-06-04: qty 1

## 2019-06-04 MED ORDER — PANTOPRAZOLE SODIUM 40 MG PO TBEC
40.0000 mg | DELAYED_RELEASE_TABLET | Freq: Every day | ORAL | Status: DC
  Administered 2019-06-05: 40 mg via ORAL
  Filled 2019-06-04: qty 1

## 2019-06-04 MED ORDER — AMLODIPINE BESYLATE 5 MG PO TABS
5.0000 mg | ORAL_TABLET | Freq: Every day | ORAL | Status: DC
  Administered 2019-06-04 – 2019-06-05 (×2): 5 mg via ORAL
  Filled 2019-06-04 (×2): qty 1

## 2019-06-04 MED ORDER — ANGIOPLASTY BOOK
Freq: Once | Status: AC
  Administered 2019-06-05: 01:00:00
  Filled 2019-06-04: qty 1

## 2019-06-04 MED ORDER — SODIUM CHLORIDE 0.9 % WEIGHT BASED INFUSION: 1.0000 mL/kg/h | INTRAVENOUS | Status: DC

## 2019-06-04 MED ORDER — IODIXANOL 320 MG/ML IV SOLN
INTRAVENOUS | Status: DC | PRN
  Administered 2019-06-04: 70 mL via INTRA_ARTERIAL

## 2019-06-04 MED ORDER — HEPARIN SODIUM (PORCINE) 1000 UNIT/ML IJ SOLN
INTRAMUSCULAR | Status: DC | PRN
  Administered 2019-06-04: 9000 [IU] via INTRAVENOUS
  Administered 2019-06-04: 2500 [IU] via INTRAVENOUS

## 2019-06-04 MED ORDER — VERAPAMIL HCL 2.5 MG/ML IV SOLN
INTRAVENOUS | Status: AC
  Filled 2019-06-04: qty 2

## 2019-06-04 MED ORDER — HEPARIN (PORCINE) IN NACL 1000-0.9 UT/500ML-% IV SOLN
INTRAVENOUS | Status: AC
  Filled 2019-06-04: qty 1000

## 2019-06-04 SURGICAL SUPPLY — 28 items
BALLN MUSTANG 4X20X135 (BALLOONS) ×3
BALLOON MUSTANG 4X20X135 (BALLOONS) ×2 IMPLANT
CATH ANGIO 5F BER2 65CM (CATHETERS) ×3 IMPLANT
CATH ANGIO 5F PIGTAIL 65CM (CATHETERS) ×3 IMPLANT
CATH SOFT-VU 4F 65 STRAIGHT (CATHETERS) ×2 IMPLANT
CATH SOFT-VU STRAIGHT 4F 65CM (CATHETERS) ×1
DEVICE CONTINUOUS FLUSH (MISCELLANEOUS) ×3 IMPLANT
DEVICE TORQUE .025-.038 (MISCELLANEOUS) ×3 IMPLANT
DIAMONDBACK SOLID OAS 2.0MM (CATHETERS) ×3
GUIDEWIRE ANGLED .035X150CM (WIRE) ×3 IMPLANT
KIT ENCORE 26 ADVANTAGE (KITS) ×3 IMPLANT
KIT PV (KITS) ×3 IMPLANT
LUBRICANT VIPERSLIDE CORONARY (MISCELLANEOUS) ×3 IMPLANT
SHEATH BRITE TIP 7FR 35CM (SHEATH) ×3 IMPLANT
SHEATH PINNACLE 5F 10CM (SHEATH) ×3 IMPLANT
SHEATH PINNACLE 7F 10CM (SHEATH) ×3 IMPLANT
STENT VIABAHN 7X39X80 VBX (Permanent Stent) ×3 IMPLANT
STOPCOCK MORSE 400PSI 3WAY (MISCELLANEOUS) ×3 IMPLANT
SYR MEDRAD MARK 7 150ML (SYRINGE) ×3 IMPLANT
SYSTEM DIMNDBCK SLD OAS 2.0MM (CATHETERS) ×2 IMPLANT
TAPE VIPERTRACK RADIOPAQ (MISCELLANEOUS) ×2 IMPLANT
TAPE VIPERTRACK RADIOPAQUE (MISCELLANEOUS) ×1
TRANSDUCER W/STOPCOCK (MISCELLANEOUS) ×3 IMPLANT
TRAY PV CATH (CUSTOM PROCEDURE TRAY) ×3 IMPLANT
TUBING CIL FLEX 10 FLL-RA (TUBING) ×3 IMPLANT
WIRE HITORQ VERSACORE ST 145CM (WIRE) ×3 IMPLANT
WIRE ROSEN-J .035X260CM (WIRE) ×3 IMPLANT
WIRE VIPER ADVANCE .017X335CM (WIRE) ×6 IMPLANT

## 2019-06-04 NOTE — Interval H&P Note (Signed)
History and Physical Interval Note:  06/04/2019 10:23 AM  Gregory Shepherd  has presented today for surgery, with the diagnosis of pad.  The various methods of treatment have been discussed with the patient and family. After consideration of risks, benefits and other options for treatment, the patient has consented to  Procedure(s): ABDOMINAL AORTOGRAM W/LOWER EXTREMITY (N/A) as a surgical intervention.  The patient's history has been reviewed, patient examined, no change in status, stable for surgery.  I have reviewed the patient's chart and labs.  Questions were answered to the patient's satisfaction.     Quay Burow

## 2019-06-04 NOTE — Progress Notes (Signed)
ACt: [id number]

## 2019-06-04 NOTE — Progress Notes (Signed)
Patient admit to room 25 6E  From the cath lab. Patient alert oriented, VS within normal range except SBP in the 180's. Patient is on bedrest until 2415. Will continue to monitor patient. Report received from Dalbert Batman, RN

## 2019-06-04 NOTE — Progress Notes (Signed)
    7 Fr. R F/A artery sheath was removed, and manual pressure was held for 30 min. due to his BP spiking.. R groin was soft and non tender. Sterile gauze was applied at the site. R DP was palpable before and after the sheath pull.  Bed rest started at 1515 X 6 hr. Instructions were given to patient about his bed rest.  HR 54 SB BP 173/70 sPO2 98% on R/A

## 2019-06-04 NOTE — Progress Notes (Signed)
1845 patient called, shaky all over, states, " there is something wrong with me, I don't know what it is." Notified B Bhagat PA and received orders for xanax prn. Patient given xanax 0.5 mg po and emotional support with good effect. Also changed IV site to decrease constant alarming of IV related to Antecubital position of existing IV's. Patient stated feeling better at [place].

## 2019-06-05 ENCOUNTER — Encounter (HOSPITAL_COMMUNITY): Payer: Self-pay | Admitting: *Deleted

## 2019-06-05 ENCOUNTER — Telehealth: Payer: Self-pay | Admitting: Cardiovascular Disease

## 2019-06-05 ENCOUNTER — Other Ambulatory Visit: Payer: Self-pay

## 2019-06-05 ENCOUNTER — Encounter (HOSPITAL_COMMUNITY): Payer: Self-pay | Admitting: Cardiovascular Disease

## 2019-06-05 ENCOUNTER — Ambulatory Visit: Payer: Self-pay

## 2019-06-05 ENCOUNTER — Emergency Department (HOSPITAL_COMMUNITY)
Admission: EM | Admit: 2019-06-05 | Discharge: 2019-06-05 | Discharge status: Against Medical Advice | Payer: Medicare Other | Source: Home / Self Care

## 2019-06-05 DIAGNOSIS — I739 Peripheral vascular disease, unspecified: Secondary | ICD-10-CM

## 2019-06-05 LAB — BASIC METABOLIC PANEL
Anion gap: 8 (ref 5–15)
Anion gap: 8 (ref 5–15)
BUN: 13 mg/dL (ref 8–23)
BUN: 15 mg/dL (ref 8–23)
CO2: 21 mmol/L — ABNORMAL LOW (ref 22–32)
CO2: 20 mmol/L — ABNORMAL LOW (ref 22–32)
Calcium: 8.8 mg/dL — ABNORMAL LOW (ref 8.9–10.3)
Calcium: 9.4 mg/dL (ref 8.9–10.3)
Chloride: 111 mmol/L (ref 98–111)
Chloride: 109 mmol/L (ref 98–111)
Creatinine, Ser: 0.79 mg/dL (ref 0.61–1.24)
Creatinine, Ser: 0.91 mg/dL (ref 0.61–1.24)
GFR calc Af Amer: >60 mL/min (ref >60)
GFR calc Af Amer: >60 mL/min (ref >60)
GFR calc non Af Amer: >60 mL/min (ref >60)
GFR calc non Af Amer: >60 mL/min (ref >60)
Glucose, Bld: 87 mg/dL (ref 70–99)
Glucose, Bld: 118 mg/dL — ABNORMAL HIGH (ref 70–99)
Potassium: 3.6 mmol/L (ref 3.5–5.1)
Potassium: 4 mmol/L (ref 3.5–5.1)
Sodium: 140 mmol/L (ref 135–145)
Sodium: 137 mmol/L (ref 135–145)

## 2019-06-05 LAB — TROPONIN I (HIGH SENSITIVITY): Troponin I (High Sensitivity): 10 ng/L (ref <18)

## 2019-06-05 LAB — CBC
HCT: 39.5 % (ref 39.0–52.0)
HCT: 40.2 % (ref 39.0–52.0)
Hemoglobin: 13.3 g/dL (ref 13.0–17.0)
Hemoglobin: 13.5 g/dL (ref 13.0–17.0)
MCH: 29.6 pg (ref 26.0–34.0)
MCH: 29.7 pg (ref 26.0–34.0)
MCHC: 33.7 g/dL (ref 30.0–36.0)
MCHC: 33.6 g/dL (ref 30.0–36.0)
MCV: 87.8 fL (ref 80.0–100.0)
MCV: 88.4 fL (ref 80.0–100.0)
Platelets: 166 10*3/uL (ref 150–400)
Platelets: 174 10*3/uL (ref 150–400)
RBC: 4.5 MIL/uL (ref 4.22–5.81)
RBC: 4.55 MIL/uL (ref 4.22–5.81)
RDW: 15 % (ref 11.5–15.5)
RDW: 14.9 % (ref 11.5–15.5)
WBC: 7.8 10*3/uL (ref 4.0–10.5)
WBC: 11.2 10*3/uL — ABNORMAL HIGH (ref 4.0–10.5)
nRBC: 0 % (ref 0.0–0.2)
nRBC: 0 % (ref 0.0–0.2)

## 2019-06-05 MED ORDER — SODIUM CHLORIDE 0.9% FLUSH: 3.0000 mL | Freq: Once | INTRAVENOUS | Status: DC

## 2019-06-05 MED ORDER — CLOPIDOGREL BISULFATE 75 MG PO TABS: 75.0000 mg | ORAL_TABLET | Freq: Every day | ORAL | 11 refills | Status: DC

## 2019-06-05 MED ORDER — PANTOPRAZOLE SODIUM 40 MG PO TBEC: 40.0000 mg | DELAYED_RELEASE_TABLET | Freq: Every day | ORAL | 11 refills | Status: DC

## 2019-06-05 MED FILL — Heparin Sod (Porcine)-NaCl IV Soln 1000 Unit/500ML-0.9%: INTRAVENOUS | Qty: 1000 | Status: AC

## 2019-06-05 NOTE — Discharge Summary (Addendum)
Discharge Summary    Patient ID: Gregory Shepherd,  MRN: [id number], DOB/AGE: 1946-06-28 73 y.o.  Admit date: 06/04/2019 Discharge date: 06/05/2019  Primary Care Provider: Kirk Ruths Primary Cardiologist: Quay Burow, MD   Discharge Diagnoses    Active Problems:   Peripheral arterial disease [facility])   Claudication in peripheral vascular disease ([place])   Allergies Allergies  Allergen Reactions   Other Palpitations    Certain steroids give patient heart palpitations     Diagnostic Studies/Procedures    Procedures Performed:               1.  Right common femoral access               2.  Placement of pigtail catheter and distal abdominal aorta               3.  Distal abdominal aortography and bilateral iliac angiography               4.  Diamondback orbital rotational atherectomy, PTA and covered stenting right common iliac artery  PROCEDURE DESCRIPTION:   The patient was brought to the second floor [facility] cath lab in the the postabsorptive state. He was not premedicated  His right groin was prepped and shaved in usual sterile fashion. Xylocaine 1% was used for local anesthesia. A 5 French sheath was inserted into the right common femoral artery using standard Seldinger technique.  A 5 French pigtail catheters placed the distal abdominal aorta.  This abdominal aortography and bilateral iliac angiography were performed.  On the peak she is for the entirety of the case.  Retrograde aortic pressure was monitored on the case.  The gradient was determined across the right common iliac artery measured 80 mmHg.    Angiographic Data:   1: Abdominal aorta- significant atherosclerotic changes and fluoroscopically calcified. 2: Left lower extremity- segmental 75% calcified left common iliac artery stenosis 3: Right lower extremity-99% calcified ostial right common iliac artery stenosis with long 78% calcified right common iliac artery  stenosis  IMPRESSION: Gregory Shepherd has severe aortoiliac calcification and atherosclerotic changes with 7080% calcified segmental left common iliac artery stenosis although he is asymptomatic on that side and subtotally to the occluded right.  We will proceed with diamondback orbital rotational atherectomy, PTA and covered stenting of the right common iliac artery. _____________   History of Present Illness    73 y.o. male w/ hx long-term tobacco use, FH CAD, CABG x 2 w/ redo x 3 1990s, HTN, HLD, PAD, who was admitted 06/29 for LE angio and possible intervention.   Hospital Course     Consultants: None   Procedure note is above. Pt tolerated well and was admitted overnight.  On 06/30, he was seen by Dr Acie Fredrickson and all data were reviewed. His renal function is stable post-cath. His BP is elevated, but has not yet had am meds.   Lives w/ wife who is able to provide care.   Cath site is without ecchymosis or hematoma.  Smoking cessation is strongly advised.   No further inpatient workup is indicated and he is considered stable for discharge, to follow up as an outpt, scheduled.  _____________  Discharge Vitals Blood pressure (!) 159/59, pulse 63, temperature 98.8 F (37.1 C), temperature source Oral, resp. rate 18, height 6\' 3"  (1.905 m), weight 90.8 kg, SpO2 95 %.  Filed Weights   06/04/19 0818 06/05/19 0500  Weight: 90.7 kg 90.8 kg  General:  Well developed, well nourished, male in no acute distress Head: Eyes PERRLA, No xanthomas.   Normocephalic and atraumatic Lungs: Clear bilaterally to auscultation. Heart: HRRR S1 S2, without MRG.  Pulses are 2+ & equal. No JVD. Abdomen: Bowel sounds are present, abdomen soft and non-tender without masses or  hernias noted. Msk: Normal strength and tone for age. Extremities: No clubbing, cyanosis or edema. R groin cath site w/out ecchymosis or hematoma, +R fem bruit.   Skin:  No rashes or lesions noted. Neuro: Alert and oriented X 3. Psych:   Good affect, responds appropriately   Labs & Radiologic Studies    CBC Recent Labs    06/05/19 0439  WBC 7.8  HGB 13.3  HCT 39.5  MCV 87.8  PLT 831   Basic Metabolic Panel Recent Labs    06/05/19 0439  NA 140  K 3.6  CL 111  CO2 21*  GLUCOSE 87  BUN 13  CREATININE 0.79  CALCIUM 8.8*    Thyroid Function Tests TSH  Date Value Ref Range Status  05/29/2019 6.660 (H) 0.450 - 4.500 uIU/mL Final   _____________  Vas [place] Abi With/wo Tbi  Result Date: 05/15/2019 LOWER EXTREMITY DOPPLER STUDY Indications: Peripheral artery disease, and Patient with known significant iliac              disease presents with worsening left lower extremity pain. The              location of the pain is near the anterolateral side of the left              thigh that sometimes radiates to the groin. Pain lasts for hours              mainly at rest and does not notice the pain as much when he walks              around. Suspect relation to pinched nerve in spine but wants to              rule out acute arterial obstruction. High Risk Factors: Hypertension, hyperlipidemia, Diabetes, current smoker.  Comparison Study: Previous ABI's performed on 02/13/2019 were 0.60 on the right                   and 0.81 on the left. Performing Technologist: Mariane Masters RVT  Examination Guidelines: A complete evaluation includes at minimum, Doppler waveform signals and systolic blood pressure reading at the level of bilateral brachial, anterior tibial, and posterior tibial arteries, when vessel segments are accessible. Bilateral testing is considered an integral part of a complete examination. Photoelectric Plethysmograph (PPG) waveforms and toe systolic pressure readings are included as required and additional duplex testing as needed. Limited examinations for reoccurring indications may be performed as noted.  ABI Findings: +---------+------------------+-----+----------+--------+  Right     Rt Pressure (mmHg) Index Waveform    Comment   +---------+------------------+-----+----------+--------+  Brachial  172                                           +---------+------------------+-----+----------+--------+  ATA       123                0.71  biphasic             +---------+------------------+-----+----------+--------+  PTA       122  0.71  biphasic             +---------+------------------+-----+----------+--------+  PERO      114                0.66  monophasic           +---------+------------------+-----+----------+--------+  Great Toe 109                0.63                       +---------+------------------+-----+----------+--------+ +---------+------------------+-----+---------------+-------+  Left      Lt Pressure (mmHg) Index Waveform        Comment  +---------+------------------+-----+---------------+-------+  Brachial  173                                               +---------+------------------+-----+---------------+-------+  ATA       164                0.95  biphasic                 +---------+------------------+-----+---------------+-------+  PTA       168                0.97  biphasic                 +---------+------------------+-----+---------------+-------+  PERO      144                0.83  nearly biphasic          +---------+------------------+-----+---------------+-------+  Great Toe 145                0.84                           +---------+------------------+-----+---------------+-------+ +-------+-----------+-----------+------------+------------+  ABI/TBI Today's ABI Today's TBI Previous ABI Previous TBI  +-------+-----------+-----------+------------+------------+  Right   0.71        0.63        0.60         0.43          +-------+-----------+-----------+------------+------------+  Left    0.97        0.84        0.81         0.63          +-------+-----------+-----------+------------+------------+ Right ABIs appear essentially unchanged compared to prior study on 02/13/19. Left ABIs appear  increased compared to prior study on 02/13/19.  Summary: Right: Resting right ankle-brachial index indicates moderate right lower extremity arterial disease. The right toe-brachial index is abnormal. Left: Resting left ankle-brachial index is within normal range. No evidence of significant left lower extremity arterial disease. The left toe-brachial index is normal.  *See table(s) above for measurements and observations.  Electronically signed by Ena Dawley MD on 05/15/2019 at 11:27:48 AM.    Final    Vas US Carotid  Result Date: 05/15/2019 Carotid Arterial Duplex Study Indications:  Carotid atherosclerosis. No cerebrovascular symptoms. Risk Factors: Hypertension, hyperlipidemia, Diabetes, current smoker. Performing Technologist: Mariane Masters RVT  Examination Guidelines: A complete evaluation includes B-mode imaging, spectral Doppler, color Doppler, and power Doppler as needed of all accessible portions of each vessel. Bilateral testing is considered an integral part of a complete examination. Limited examinations for reoccurring indications  may be performed as noted.  Right Carotid Findings: +----------+--------+--------+--------+------------+--------+             PSV cm/s EDV cm/s Stenosis Describe     Comments  +----------+--------+--------+--------+------------+--------+  CCA Prox   115      8                                        +----------+--------+--------+--------+------------+--------+  CCA Distal 66       10                                       +----------+--------+--------+--------+------------+--------+  ICA Prox   69       17       1-39%    heterogenous           +----------+--------+--------+--------+------------+--------+  ICA Mid    62       13                                       +----------+--------+--------+--------+------------+--------+  ICA Distal 60       13                                       +----------+--------+--------+--------+------------+--------+  ECA        156      12                                        +----------+--------+--------+--------+------------+--------+ +----------+--------+-------+----------------+-------------------+             PSV cm/s EDV cms Describe         Arm Pressure (mmHG)  +----------+--------+-------+----------------+-------------------+  Subclavian 206              Multiphasic, WNL 172                  +----------+--------+-------+----------------+-------------------+ +---------+--------+--+--------+--+---------+  Vertebral PSV cm/s 62 EDV cm/s 10 Antegrade  +---------+--------+--+--------+--+---------+  Left Carotid Findings: +----------+--------+--------+--------+------------+--------+             PSV cm/s EDV cm/s Stenosis Describe     Comments  +----------+--------+--------+--------+------------+--------+  CCA Prox   86       8                                        +----------+--------+--------+--------+------------+--------+  CCA Distal 49       8                                        +----------+--------+--------+--------+------------+--------+  ICA Prox   70       12                heterogenous           +----------+--------+--------+--------+------------+--------+  ICA Mid    85       16  1-39%                 tortuous  +----------+--------+--------+--------+------------+--------+  ICA Distal 112      19                             tortuous  +----------+--------+--------+--------+------------+--------+  ECA        144      8                                        +----------+--------+--------+--------+------------+--------+ +----------+--------+--------+----------------+-------------------+  Subclavian PSV cm/s EDV cm/s Describe         Arm Pressure (mmHG)  +----------+--------+--------+----------------+-------------------+             207               Multiphasic, WNL 173                  +----------+--------+--------+----------------+-------------------+ +---------+--------+--+--------+--+---------+  Vertebral PSV cm/s 63 EDV  cm/s 15 Antegrade  +---------+--------+--+--------+--+---------+  Summary: Right Carotid: Velocities in the right ICA are consistent with a 1-39% stenosis. Left Carotid: Velocities in the left ICA are consistent with a 1-39% stenosis. Vertebrals:  Bilateral vertebral arteries demonstrate antegrade flow. Subclavians: Normal flow hemodynamics were seen in bilateral subclavian              arteries. *See table(s) above for measurements and observations.  Electronically signed by Ena Dawley MD on 05/15/2019 at 11:26:54 AM.    Final    Disposition   Pt is being discharged home today in good condition.  Follow-up Plans & Appointments     Discharge Instructions    Diet - low sodium heart healthy   Complete by: As directed    Increase activity slowly   Complete by: As directed       Discharge Medications   Allergies as of 06/05/2019      Reactions   Other Palpitations   Certain steroids give patient heart palpitations       Medication List    STOP taking these medications   omeprazole 20 MG capsule Commonly known as: PRILOSEC Replaced by: pantoprazole 40 MG tablet     TAKE these medications   amLODipine-benazepril 5-10 MG capsule Commonly known as: LOTREL Take 1 capsule by mouth daily.   aspirin 81 MG chewable tablet Chew by mouth daily.   atenolol 25 MG tablet Commonly known as: TENORMIN Take 25 mg by mouth daily.   atorvastatin 80 MG tablet Commonly known as: LIPITOR Take 80 mg by mouth daily.   cholecalciferol 25 MCG (1000 UT) tablet Commonly known as: VITAMIN D3 Take 1,000 Units by mouth daily.   clopidogrel 75 MG tablet Commonly known as: PLAVIX Take 1 tablet (75 mg total) by mouth daily with breakfast. Start taking on: June 06, 2019   finasteride 5 MG tablet Commonly known as: PROSCAR Take 1 tablet (5 mg total) by mouth daily.   gabapentin 100 MG capsule Commonly known as: NEURONTIN Take 1 capsule (100 mg total) by mouth at bedtime.   gabapentin 300  MG capsule Commonly known as: NEURONTIN Take 1 capsule (300 mg total) by mouth 3 (three) times daily.   isosorbide mononitrate 60 MG 24 hr tablet Commonly known as: IMDUR Take 60 mg by mouth daily.   multivitamin tablet Take 1 tablet by mouth daily.  nitroGLYCERIN 0.4 MG/SPRAY spray Commonly known as: NITROLINGUAL Place 1 spray under the tongue every 5 (five) minutes x 3 doses as needed for chest pain.   pantoprazole 40 MG tablet Commonly known as: PROTONIX Take 1 tablet (40 mg total) by mouth daily. Start taking on: June 06, 2019 Replaces: omeprazole 20 MG capsule   vitamin B-12 1000 MCG tablet Commonly known as: CYANOCOBALAMIN Take 1,000 mcg by mouth daily.          Outstanding Labs/Studies   None  Duration of Discharge Encounter   Greater than 30 minutes including physician time.  Jonetta Speak NP 06/05/2019, 11:16 AM   Attending Note:   The patient was seen and examined.  Agree with assessment and plan as noted above.  Changes made to the above note as needed.  Patient seen and independently examined with  Rosaria Ferries, PA .   We discussed all aspects of the encounter. I agree with the assessment and plan as stated above.  1. PVD:   Doing well.   S/p PCI.  Doing well  Follow up with Dr. Gwenlyn Found.  Continue current meds.     I have spent a total of 40 minutes with patient reviewing hospital  notes , telemetry, EKGs, labs and examining patient as well as establishing an assessment and plan that was discussed with the patient. > 50% of time was spent in direct patient care.    Thayer Headings, Brooke Bonito., MD, [facility] 06/05/2019, 11:21 AM [address]  [place] Pager [phone]

## 2019-06-05 NOTE — Telephone Encounter (Signed)
  Wife is calling because Gregory Shepherd is having some anxiety after coming home from his procedure. His wife states they gave him a xanax at the hospital that helped him calmed down. She would like to know if the patient could be given something to help calm him down so he can sleep tonight.

## 2019-06-05 NOTE — ED Notes (Signed)
Pt came to nurse first states "I feel fine now, I am going to leave and follow up with my doctor tomorrow".  Encouraged pt to stay and be seen but pt declined.  Advise to call 911 if symptoms persist or worsens.  Pt verbalized understanding.

## 2019-06-05 NOTE — Telephone Encounter (Signed)
Patients wife called stating that her husband yesterday had an iliac catheterization and stent placement. He had to spend the night at [facility] because of anxiety and elevating BP post op. She is calling today because he has started to feel really anxious again. She is unsure about his BP. He is not bleeding for his operative site . Per protocol patient will go back to [facility] ER for evaluation. Pt wife states that Dr Gwenlyn Found was his cardiologist and that dr Tamala Julian was the referring physician.  Answer Assessment - Initial Assessment Questions 1. CONCERN: "What happened that made you call today?"     surgery 2. ANXIETY SYMPTOM SCREENING: "Can you describe how you have been feeling?"  (e.g., tense, restless, panicky, anxious, keyed up, trouble sleeping, trouble concentrating)     Panicked and anxious 3. ONSET: "How long have you been feeling this way?"     today 4. RECURRENT: "Have you felt this way before?"  If yes: "What happened that time?" "What helped these feelings go away in the past?"     No hx 5. RISK OF HARM - SUICIDAL IDEATION:  "Do you ever have thoughts of hurting or killing yourself?"  (e.g., yes, no, no but preoccupation with thoughts about death)   - INTENT:  "Do you have thoughts of hurting or killing yourself right NOW?" (e.g., yes, no, N/A)   - PLAN: "Do you have a specific plan for how you would do this?" (e.g., gun, knife, overdose, no plan, N/A)     *No Answer* 6. RISK OF HARM - HOMICIDAL IDEATION:  "Do you ever have thoughts of hurting or killing someone else?"  (e.g., yes, no, no but preoccupation with thoughts about death)   - INTENT:  "Do you have thoughts of hurting or killing someone right NOW?" (e.g., yes, no, N/A)   - PLAN: "Do you have a specific plan for how you would do this?" (e.g., gun, knife, no plan, N/A)      *No Answer* 7. FUNCTIONAL IMPAIRMENT: "How have things been going for you overall in your life? Have you had any more difficulties than usual doing your normal  daily activities?"  (e.g., better, same, worse; self-care, school, work, interactions)     *No Answer* 8. SUPPORT: "Who is with you now?" "Who do you live with?" "Do you have family or friends nearby who you can talk to?"      *No Answer* 9. THERAPIST: "Do you have a counselor or therapist? Name?"     *No Answer* 10. STRESSORS: "Has there been any new stress or recent changes in your life?"       *No Answer* 11. CAFFEINE ABUSE: "Do you drink caffeinated beverages, and how much each day?" (e.g., coffee, tea, colas)       *No Answer* 12. SUBSTANCE ABUSE: "Do you use any illegal drugs or alcohol?"       *No Answer* 13. OTHER SYMPTOMS: "Do you have any other physical symptoms right now?" (e.g., chest pain, palpitations, difficulty breathing, fever)       *No Answer* 14. PREGNANCY: "Is there any chance you are pregnant?" "When was your last menstrual period?"       *No Answer*  Protocols used: ANXIETY AND PANIC ATTACK-A-AH

## 2019-06-05 NOTE — Telephone Encounter (Signed)
The patient's wife called asking if the patient could have a prescription for Xanax for a few days. He was given Xanax in the hospital after his procedure yesterday and it helped calm his nerves. She stated that he is having a hard time anxiety wise after the procedure and keeps pacing the floor.   Message routed to the provider.

## 2019-06-05 NOTE — Discharge Instructions (Signed)

## 2019-06-05 NOTE — ED Triage Notes (Signed)
Pt reports having arteriogram done to lower extremity yesterday and dc home today. Now has sob since this afternoon. Denies cp or cough. No acute resp distress is noted.

## 2019-06-06 NOTE — Telephone Encounter (Signed)
He will need to get Xanax from his PCP

## 2019-06-06 NOTE — Telephone Encounter (Signed)
Follow up    Patients wife is calling to check on Dr. Gwenlyn Found recommendation for her husband. She ask if he can be prescribed the xanax that it goes to [place], [place]

## 2019-06-06 NOTE — Telephone Encounter (Signed)
Spoke with pt wife Belenda Cruise who called regarding pt on 6/30. She is aware that pt will need to have his PCP prescribe Xanax

## 2019-06-06 NOTE — Telephone Encounter (Signed)
Wife would like to have an rx for xanax-she states that he was unable to lay down-or sleep and went to ER and unable to lie down he is just too anxious to panic/sleep d/t the episode that happened in the recovery room after procedure ABDOMINAL AORTOGRAM/PERIPHERAL VASCULAR ATHERECTOMY/stent. She states that he is just too anxious and having panic attacks since and unable to sit still and does not feel like he is in control starting with the incident in the recovery room . Would like an rx "just for a few days" to get him back to "normal"

## 2019-06-14 ENCOUNTER — Other Ambulatory Visit (HOSPITAL_COMMUNITY): Payer: Self-pay | Admitting: Cardiovascular Disease

## 2019-06-14 DIAGNOSIS — Z95828 Presence of other vascular implants and grafts: Secondary | ICD-10-CM

## 2019-06-14 DIAGNOSIS — I771 Stricture of artery: Secondary | ICD-10-CM

## 2019-06-15 ENCOUNTER — Encounter (HOSPITAL_COMMUNITY): Payer: Self-pay | Admitting: Cardiovascular Disease

## 2019-06-21 ENCOUNTER — Encounter (HOSPITAL_COMMUNITY): Payer: Medicare Other

## 2019-06-21 ENCOUNTER — Other Ambulatory Visit (HOSPITAL_COMMUNITY): Payer: Self-pay | Admitting: Cardiovascular Disease

## 2019-06-21 ENCOUNTER — Other Ambulatory Visit: Payer: Self-pay

## 2019-06-21 ENCOUNTER — Ambulatory Visit (HOSPITAL_BASED_OUTPATIENT_CLINIC_OR_DEPARTMENT_OTHER)
Admission: RE | Admit: 2019-06-21 | Discharge: 2019-06-21 | Disposition: A | Discharge status: Home / Self Care | Payer: Medicare Other | Source: Ambulatory Visit | Attending: Cardiology | Admitting: Cardiology

## 2019-06-21 ENCOUNTER — Ambulatory Visit (HOSPITAL_COMMUNITY)
Admission: RE | Admit: 2019-06-21 | Discharge: 2019-06-21 | Disposition: A | Discharge status: Home / Self Care | Payer: Medicare Other | Source: Ambulatory Visit | Attending: Cardiology | Admitting: Cardiology

## 2019-06-21 DIAGNOSIS — Z95828 Presence of other vascular implants and grafts: Secondary | ICD-10-CM | POA: Diagnosis not present

## 2019-06-21 DIAGNOSIS — I771 Stricture of artery: Secondary | ICD-10-CM

## 2019-06-21 DIAGNOSIS — I739 Peripheral vascular disease, unspecified: Secondary | ICD-10-CM

## 2019-06-25 ENCOUNTER — Other Ambulatory Visit: Payer: Self-pay

## 2019-06-25 NOTE — Progress Notes (Signed)
Notes recorded by Lorretta Harp, MD on 06/21/2019 at 5:07 PM EDT  Moderately elevated velocities within the right common iliac artery stent status post intervention. Repeat 6 months S/P insertion of iliac artery stent

## 2019-06-29 ENCOUNTER — Encounter (HOSPITAL_COMMUNITY): Payer: Medicare Other

## 2019-07-10 ENCOUNTER — Encounter: Payer: Self-pay | Admitting: Cardiovascular Disease

## 2019-07-10 ENCOUNTER — Ambulatory Visit (INDEPENDENT_AMBULATORY_CARE_PROVIDER_SITE_OTHER): Payer: Medicare Other | Admitting: Cardiovascular Disease

## 2019-07-10 ENCOUNTER — Other Ambulatory Visit: Payer: Self-pay

## 2019-07-10 DIAGNOSIS — I739 Peripheral vascular disease, unspecified: Secondary | ICD-10-CM

## 2019-07-10 DIAGNOSIS — E782 Mixed hyperlipidemia: Secondary | ICD-10-CM

## 2019-07-10 DIAGNOSIS — I10 Essential (primary) hypertension: Secondary | ICD-10-CM | POA: Diagnosis not present

## 2019-07-10 DIAGNOSIS — I2510 Atherosclerotic heart disease of native coronary artery without angina pectoris: Secondary | ICD-10-CM

## 2019-07-10 NOTE — Assessment & Plan Note (Signed)
History of PAD status post orbital atherectomy, PTA by myself 06/04/2019.  Did have 75 to 70% left common iliac artery stenosis.  Claudication which had previously been diagnosed as spinal stenosis has completely resolved and his Dopplers have normalized.  He remains on dual antiplatelet therapy.  He has no claudication on the left either.  We will will repeat lower extremity arterial Doppler studies in 6 months

## 2019-07-10 NOTE — Progress Notes (Signed)
07/10/2019 Gregory Shepherd   1946-10-04  [id number]  Primary Physician Gregory Ruths, MD Primary Cardiologist: Gregory Harp MD FACP, [place], [place], [place]  HPI:  Gregory Shepherd is a 73 y.o.  mild to moderately overweight married Caucasian male father of 2, grandfather of 3 grandchildren who I last saw in the office 05/23/2019.He was referred by Dr. Gardenia Shepherd for peripheral vascular valuation because of claudication. He is retired from being in [employer] 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 He has treated hypertension hyperlipidemia. His father did have a myocardial infarction. He had CABG x2 at [facility] in [place] by Dr. Eliberto Shepherd. He did have a remote MI at age 15 as well. He had redo bypass surgery x3 at [facility] 8 years later.  His major issue is with left lower extremity numbness and claudication. 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 He has high-frequency signals in both iliac arteries and monophasic waveforms below that on the right.  Since I saw him in the office 05/23/2019 I did proceed with lower extremity angiography and intervention in 06/04/2019 revealing a 99% calcified ostial/proximal right common iliac artery stenosis and a 60 to 70% left common iliac artery stenosis.  Performed diamondback orbital rotational atherectomy, PTA and stenting with an excellent result.  His Dopplers normalized and his claudication resolved..   Current Meds  Medication Sig  . amLODipine-benazepril (LOTREL) 5-10 MG capsule Take 1 capsule by mouth daily.  Marland Kitchen aspirin 81 MG chewable tablet Chew by mouth daily.  Marland Kitchen atenolol (TENORMIN) 25 MG tablet Take 25 mg by mouth daily.  Marland Kitchen atorvastatin (LIPITOR) 80 MG tablet Take 80 mg by mouth daily.  . cholecalciferol (VITAMIN D3) 25 MCG (1000 UT) tablet  Take 1,000 Units by mouth daily.  . clopidogrel (PLAVIX) 75 MG tablet Take 1 tablet (75 mg total) by mouth daily with breakfast.  . finasteride (PROSCAR) 5 MG tablet Take 1 tablet (5 mg total) by mouth daily.  Marland Kitchen gabapentin (NEURONTIN) 300 MG capsule Take 1 capsule (300 mg total) by mouth 3 (three) times daily.  . isosorbide mononitrate (IMDUR) 60 MG 24 hr tablet Take 60 mg by mouth daily.  . Multiple Vitamin (MULTIVITAMIN) tablet Take 1 tablet by mouth daily.  . nitroGLYCERIN (NITROLINGUAL) 0.4 MG/SPRAY spray Place 1 spray under the tongue every 5 (five) minutes x 3 doses as needed for chest pain.  . pantoprazole (PROTONIX) 40 MG tablet Take 1 tablet (40 mg total) by mouth daily.  . vitamin B-12 (CYANOCOBALAMIN) 1000 MCG tablet Take 1,000 mcg by mouth daily.     Allergies  Allergen Reactions  . Other Palpitations    Certain steroids give patient heart palpitations     Social History   Socioeconomic History  . Marital status: Married    Spouse name: Not on file  . Number of children: Not on file  . Years of education: Not on file  . Highest education level: Not on file  Occupational History  . Not on file  Social Needs  . Financial resource strain: Not on file  . Food insecurity    Worry: Not on file    Inability: Not on file  . Transportation needs    Medical: Not on file    Non-medical: Not on file  Tobacco Use  .  Smoking status: Current Every Day Smoker  . Smokeless tobacco: Never Used  . Tobacco comment: 8-9 cigarettes per day  Substance and Sexual Activity  . Alcohol use: No    Frequency: Never  . Drug use: No  . Sexual activity: Not on file  Lifestyle  . Physical activity    Days per week: Not on file    Minutes per session: Not on file  . Stress: Not on file  Relationships  . Social Herbalist on phone: Not on file    Gets together: Not on file    Attends religious service: Not on file    Active member of club or organization: Not on file     Attends meetings of clubs or organizations: Not on file    Relationship status: Not on file  . Intimate partner violence    Fear of current or ex partner: Not on file    Emotionally abused: Not on file    Physically abused: Not on file    Forced sexual activity: Not on file  Other Topics Concern  . Not on file  Social History Narrative  . Not on file     Review of Systems: General: negative for chills, fever, night sweats or weight changes.  Cardiovascular: negative for chest pain, dyspnea on exertion, edema, orthopnea, palpitations, paroxysmal nocturnal dyspnea or shortness of breath Dermatological: negative for rash Respiratory: negative for cough or wheezing Urologic: negative for hematuria Abdominal: negative for nausea, vomiting, diarrhea, bright red blood per rectum, melena, or hematemesis Neurologic: negative for visual changes, syncope, or dizziness All other systems reviewed and are otherwise negative except as noted above.    Blood pressure (!) 182/68, pulse (!) 53, temperature 98.2 F (36.8 C), height 6\' 3"  (1.905 m), weight 200 lb 9.6 oz (91 kg), SpO2 97 %.  General appearance: alert and no distress Neck: no adenopathy, no carotid bruit, no JVD, supple, symmetrical, trachea midline and thyroid not enlarged, symmetric, no tenderness/mass/nodules Lungs: clear to auscultation bilaterally Heart: regular rate and rhythm, S1, S2 normal, no murmur, click, rub or gallop Extremities: extremities normal, atraumatic, no cyanosis or edema Pulses: 2+ and symmetric Skin: Skin color, texture, turgor normal. No rashes or lesions Neurologic: Alert and oriented X 3, normal strength and tone. Normal symmetric reflexes. Normal coordination and gait  EKG not performed today  ASSESSMENT AND PLAN:   Atherosclerotic heart disease of native coronary artery without angina pectoris History of CAD status post CABG x2 by Dr. Eliberto Shepherd at [facility] in [phone] with repeat bypass  surgery in [phone].  He denies chest pain or shortness of breath.  Essential (primary) hypertension History of essential hypertension with blood pressure measured at 182/68 although normal at home when he checks his blood pressure it is 140/70.  He is on amlodipine, benazepril and atenolol.  Peripheral arterial disease (HCC) History of PAD status post orbital atherectomy, PTA by myself 06/04/2019.  Did have 44 to 70% left common iliac artery stenosis.  Claudication which had previously been diagnosed as spinal stenosis has completely resolved and his Dopplers have normalized.  He remains on dual antiplatelet therapy.  He has no claudication on the left either.  We will will repeat lower extremity arterial Doppler studies in 6 months  Hyperlipidemia History of hyperlipidemia on statin therapy followed by his PCP.  He did say that his lipid profile performed 2/20 revealed total cholesterol 102.      Gregory Harp MD FACP,FACC,FAHA, [facility]  07/10/2019 2:32 PM

## 2019-07-10 NOTE — Assessment & Plan Note (Signed)
History of essential hypertension with blood pressure measured at 182/68 although normal at home when he checks his blood pressure it is 140/70.  He is on amlodipine, benazepril and atenolol.

## 2019-07-10 NOTE — Patient Instructions (Addendum)
Medication Instructions:  Your physician recommends that you continue on your current medications as directed. Please refer to the Current Medication list given to you today.  If you need a refill on your cardiac medications before your next appointment, please call your pharmacy.   Lab work: NONE If you have labs (blood work) drawn today and your tests are completely normal, you will receive your results only by: Marland Kitchen MyChart Message (if you have MyChart) OR . A paper copy in the mail If you have any lab test that is abnormal or we need to change your treatment, we will call you to review the results.  Testing/Procedures: Your physician has requested that you have an aorta/iliac duplex. During this test, an ultrasound is used to evaluate blood flow to the aorta and iliac arteries. Allow one hour for this exam. Do not eat after midnight the day before and avoid carbonated beverages. TO BE SCHEDULED January 2021  Your physician has requested that you have an ankle brachial index (ABI). During this test an ultrasound and blood pressure cuff are used to evaluate the arteries that supply the arms and legs with blood. Allow thirty minutes for this exam. There are no restrictions or special instructions. TO BE SCHEDULED January 2021   Follow-Up: At [facility], you and your health needs are our priority.  As part of our continuing mission to provide you with exceptional heart care, we have created designated Provider Care Teams.  These Care Teams include your primary Cardiologist (physician) and Advanced Practice Providers (APPs -  Physician Assistants and Nurse Practitioners) who all work together to provide you with the care you need, when you need it. You will need a follow up appointment in 6 months WITH DR. Gwenlyn Found.  Please call our office 2 months in advance to schedule this appointment.

## 2019-07-10 NOTE — Assessment & Plan Note (Signed)
History of hyperlipidemia on statin therapy followed by his PCP.  He did say that his lipid profile performed 2/20 revealed total cholesterol 102.

## 2019-07-10 NOTE — Assessment & Plan Note (Signed)
History of CAD status post CABG x2 by Dr. Eliberto Ivory at [facility] in [phone] with repeat bypass surgery in [phone].  He denies chest pain or shortness of breath.

## 2019-12-10 ENCOUNTER — Other Ambulatory Visit (HOSPITAL_COMMUNITY): Payer: Self-pay | Admitting: Cardiovascular Disease

## 2019-12-10 ENCOUNTER — Other Ambulatory Visit: Payer: Self-pay

## 2019-12-10 ENCOUNTER — Ambulatory Visit (HOSPITAL_BASED_OUTPATIENT_CLINIC_OR_DEPARTMENT_OTHER)
Admission: RE | Admit: 2019-12-10 | Discharge: 2019-12-10 | Disposition: A | Discharge status: Home / Self Care | Payer: Medicare Other | Source: Ambulatory Visit | Attending: Cardiovascular Disease | Admitting: Cardiovascular Disease

## 2019-12-10 ENCOUNTER — Ambulatory Visit (HOSPITAL_COMMUNITY)
Admission: RE | Admit: 2019-12-10 | Discharge: 2019-12-10 | Disposition: A | Discharge status: Home / Self Care | Payer: Medicare Other | Source: Ambulatory Visit | Attending: Cardiology | Admitting: Cardiology

## 2019-12-10 DIAGNOSIS — Z95828 Presence of other vascular implants and grafts: Secondary | ICD-10-CM

## 2019-12-10 DIAGNOSIS — I771 Stricture of artery: Secondary | ICD-10-CM

## 2019-12-10 DIAGNOSIS — I739 Peripheral vascular disease, unspecified: Secondary | ICD-10-CM

## 2019-12-25 ENCOUNTER — Ambulatory Visit: Payer: Medicare Other | Admitting: Cardiovascular Disease

## 2019-12-25 ENCOUNTER — Encounter: Payer: Self-pay | Admitting: Cardiovascular Disease

## 2019-12-25 ENCOUNTER — Other Ambulatory Visit: Payer: Self-pay

## 2019-12-25 VITALS — BP 138/62 | HR 52 | Temp 97.4°F | Ht 75.0 in | Wt 205.0 lb

## 2019-12-25 DIAGNOSIS — I2510 Atherosclerotic heart disease of native coronary artery without angina pectoris: Secondary | ICD-10-CM | POA: Diagnosis not present

## 2019-12-25 DIAGNOSIS — I10 Essential (primary) hypertension: Secondary | ICD-10-CM | POA: Diagnosis not present

## 2019-12-25 DIAGNOSIS — I739 Peripheral vascular disease, unspecified: Secondary | ICD-10-CM

## 2019-12-25 DIAGNOSIS — E782 Mixed hyperlipidemia: Secondary | ICD-10-CM

## 2019-12-25 NOTE — Assessment & Plan Note (Signed)
History of peripheral arterial disease status post orbital atherectomy, VBX covered stenting with a 99% calcified ostial/proximal right common iliac artery stenosis 06/04/2019.  His Dopplers normalized and his right lower extremity claudication resolved.  He did have a 67% left but was asymptomatic on that side and with this was not intervened on.  Recent Doppler studies performed 12/10/2019 revealed a widely patent right iliac stent with moderate left iliac stenosis.  The patient does get some mild left leg claudication when he walks between 2 to 3 miles but this has remained stable and is not lifestyle limiting.

## 2019-12-25 NOTE — Assessment & Plan Note (Signed)
History of essential hypertension with blood pressure measured today 138/62.  He is on atenolol, amlodipine and Lotensin.

## 2019-12-25 NOTE — Assessment & Plan Note (Signed)
History of hyperlipidemia on statin therapy followed by his PCP 

## 2019-12-25 NOTE — Patient Instructions (Signed)
Medication Instructions:  Your physician recommends that you continue on your current medications as directed. Please refer to the Current Medication list given to you today.  If you need a refill on your cardiac medications before your next appointment, please call your pharmacy.   Lab work: NONE  Testing/Procedures: Your physician has requested that you have a lower extremity arterial exercise duplex in 1 year. During this test, exercise and ultrasound are used to evaluate arterial blood flow in the legs. Allow one hour for this exam. There are no restrictions or special instructions.  AND  Your physician has requested that you have an ankle brachial index (ABI) in 1 year. During this test an ultrasound and blood pressure cuff are used to evaluate the arteries that supply the arms and legs with blood. Allow thirty minutes for this exam. There are no restrictions or special instructions.  Follow-Up: At [facility], you and your health needs are our priority.  As part of our continuing mission to provide you with exceptional heart care, we have created designated Provider Care Teams.  These Care Teams include your primary Cardiologist (physician) and Advanced Practice Providers (APPs -  Physician Assistants and Nurse Practitioners) who all work together to provide you with the care you need, when you need it. You may see Quay Burow, MD or one of the following Advanced Practice Providers on your designated Care Team:    Kerin Ransom, PA-C  [place], [place]  Coletta Memos, [place]  Your physician wants you to follow-up in: 6 months with Kerin Ransom, PA-C  Your physician wants you to follow-up in: 1 year with Dr. Gwenlyn Found

## 2019-12-25 NOTE — Progress Notes (Signed)
12/25/2019 Gregory Shepherd   [birth year]  [id number]  Primary Physician Kirk Ruths, MD Primary Cardiologist: Lorretta Harp MD FACP, [place], [place], [place]  HPI:  Gregory Shepherd is a 74 y.o.  mild to moderately overweight married Caucasian male father of 2, grandfather of 3 grandchildrenwho I last saw in the office  07/10/2019.He was referred by Dr. Gardenia Phlegm for peripheral vascular valuation because of claudication. He is retired from being in [employer]. 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 He has treated hypertension hyperlipidemia. His father did have a myocardial infarction. He had CABG x2 at [facility] in [place] by Dr. Eliberto Ivory. He did have a remote MI at age 1 as well. He had redo bypass surgery x3 at [facility] 8 years later. His major issue is with left lower extremity numbness and claudication. 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 He has high-frequency signals in both iliac arteries and monophasic waveforms below that on the right.  We proceeded with lower extremity angiography and intervention in 06/04/2019 revealing a 99% calcified ostial/proximal right common iliac artery stenosis and a 60 to 70% left common iliac artery stenosis.  Performed diamondback orbital rotational atherectomy, PTA and stenting with an excellent result.  His Dopplers normalized and his claudication resolved.  Since I saw him 6 months ago he continues to do well.  He enjoys the lack of claudication on the right side of his more ambulatory.  He has mild left lower extremity claudication when walking 2 to 3 miles but this is not lifestyle limiting.  Recent Doppler studies performed 12/10/2019 revealed a widely patent right common iliac artery stent with mild to moderate left iliac stenosis.  He denies chest  pain or shortness of breath.   Current Meds  Medication Sig  . amLODipine-benazepril (LOTREL) 5-10 MG capsule Take 1 capsule by mouth daily.  Marland Kitchen aspirin 81 MG chewable tablet Chew by mouth daily.  Marland Kitchen atenolol (TENORMIN) 25 MG tablet Take 25 mg by mouth daily.  Marland Kitchen atorvastatin (LIPITOR) 80 MG tablet Take 80 mg by mouth daily.  . cholecalciferol (VITAMIN D3) 25 MCG (1000 UT) tablet Take 1,000 Units by mouth daily.  . clopidogrel (PLAVIX) 75 MG tablet Take 1 tablet (75 mg total) by mouth daily with breakfast.  . finasteride (PROSCAR) 5 MG tablet Take 1 tablet (5 mg total) by mouth daily.  Marland Kitchen gabapentin (NEURONTIN) 300 MG capsule Take 1 capsule (300 mg total) by mouth 3 (three) times daily.  . isosorbide mononitrate (IMDUR) 60 MG 24 hr tablet Take 60 mg by mouth daily.  . Multiple Vitamin (MULTIVITAMIN) tablet Take 1 tablet by mouth daily.  . nitroGLYCERIN (NITROLINGUAL) 0.4 MG/SPRAY spray Place 1 spray under the tongue every 5 (five) minutes x 3 doses as needed for chest pain.  . vitamin B-12 (CYANOCOBALAMIN) 1000 MCG tablet Take 1,000 mcg by mouth daily.     Allergies  Allergen Reactions  . Other Palpitations    Certain steroids give patient heart palpitations     Social History   Socioeconomic History  . Marital status: Married    Spouse name: Not on file  . Number of children: Not on file  . Years of education: Not on file  . Highest education level: Not on file  Occupational History  . Not  on file  Tobacco Use  . Smoking status: Current Every Day Smoker  . Smokeless tobacco: Never Used  . Tobacco comment: 8-9 cigarettes per day  Substance and Sexual Activity  . Alcohol use: No  . Drug use: No  . Sexual activity: Not on file  Other Topics Concern  . Not on file  Social History Narrative  . Not on file   Social Determinants of Health   Financial Resource Strain:   . Difficulty of Paying Living Expenses: Not on file  Food Insecurity:   . Worried About Sales executive in the Last Year: Not on file  . Ran Out of Food in the Last Year: Not on file  Transportation Needs:   . Lack of Transportation (Medical): Not on file  . Lack of Transportation (Non-Medical): Not on file  Physical Activity:   . Days of Exercise per Week: Not on file  . Minutes of Exercise per Session: Not on file  Stress:   . Feeling of Stress : Not on file  Social Connections:   . Frequency of Communication with Friends and Family: Not on file  . Frequency of Social Gatherings with Friends and Family: Not on file  . Attends Religious Services: Not on file  . Active Member of Clubs or Organizations: Not on file  . Attends Archivist Meetings: Not on file  . Marital Status: Not on file  Intimate Partner Violence:   . Fear of Current or Ex-Partner: Not on file  . Emotionally Abused: Not on file  . Physically Abused: Not on file  . Sexually Abused: Not on file     Review of Systems: General: negative for chills, fever, night sweats or weight changes.  Cardiovascular: negative for chest pain, dyspnea on exertion, edema, orthopnea, palpitations, paroxysmal nocturnal dyspnea or shortness of breath Dermatological: negative for rash Respiratory: negative for cough or wheezing Urologic: negative for hematuria Abdominal: negative for nausea, vomiting, diarrhea, bright red blood per rectum, melena, or hematemesis Neurologic: negative for visual changes, syncope, or dizziness All other systems reviewed and are otherwise negative except as noted above.    Blood pressure 138/62, pulse (!) 52, temperature (!) 97.4 F (36.3 C), height 6\' 3"  (1.905 m), weight 205 lb (93 kg).  General appearance: alert and no distress Neck: no adenopathy, no carotid bruit, no JVD, supple, symmetrical, trachea midline and thyroid not enlarged, symmetric, no tenderness/mass/nodules Lungs: clear to auscultation bilaterally Heart: regular rate and rhythm, S1, S2 normal, no murmur, click, rub or  gallop Extremities: extremities normal, atraumatic, no cyanosis or edema Pulses: 2+ and symmetric Skin: Skin color, texture, turgor normal. No rashes or lesions Neurologic: Alert and oriented X 3, normal strength and tone. Normal symmetric reflexes. 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  I personally reviewed this EKG.  ASSESSMENT AND PLAN:   Atherosclerotic heart disease of native coronary artery without angina pectoris History of CAD status post CABG at [facility] by Dr. Eliberto Ivory in 1990 with redo X 3  in 1998.  He denies chest pain or shortness of breath.  Essential (primary) hypertension History of essential hypertension with blood pressure measured today 138/62.  He is on atenolol, amlodipine and Lotensin.  Peripheral arterial disease (HCC) History of peripheral arterial disease status post orbital atherectomy, VBX covered stenting with a 99% calcified ostial/proximal right common iliac artery stenosis 06/04/2019.  His Dopplers  normalized and his right lower extremity claudication resolved.  He did have a 67% left but was asymptomatic on that side and with this was not intervened on.  Recent Doppler studies performed 12/10/2019 revealed a widely patent right iliac stent with moderate left iliac stenosis.  The patient does get some mild left leg claudication when he walks between 2 to 3 miles but this has remained stable and is not lifestyle limiting.  Hyperlipidemia History of hyperlipidemia on statin therapy followed by his PCP      Lorretta Harp MD [facility], [facility] 12/25/2019 2:50 PM

## 2019-12-25 NOTE — Assessment & Plan Note (Signed)
History of CAD status post CABG at [facility] by Dr. Eliberto Ivory in 1990 with redo X 3  in 1998.  He denies chest pain or shortness of breath.

## 2020-01-03 ENCOUNTER — Ambulatory Visit: Payer: Medicare Other

## 2020-01-11 ENCOUNTER — Ambulatory Visit: Payer: Medicare Other

## 2020-01-12 ENCOUNTER — Ambulatory Visit: Payer: Medicare Other | Attending: Internal Medicine

## 2020-01-12 DIAGNOSIS — Z23 Encounter for immunization: Secondary | ICD-10-CM | POA: Insufficient documentation

## 2020-01-12 NOTE — Progress Notes (Signed)
   Covid-19 Vaccination Clinic  Name:  Gregory Shepherd    MRN: [id number] DOB: 1946-01-02  01/12/2020  Mr. Huaracha was observed post Covid-19 immunization for 15 minutes without incidence. He was provided with Vaccine Information Sheet and instruction to access the V-Safe system.   Mr. Ice was instructed to call 911 with any severe reactions post vaccine: Marland Kitchen Difficulty breathing  . Swelling of your face and throat  . A fast heartbeat  . A bad rash all over your body  . Dizziness and weakness    Immunizations Administered    Name Date Dose VIS Date Route   Pfizer COVID-19 Vaccine 01/12/2020  2:30 PM 0.3 mL 11/16/2019 Intramuscular   Manufacturer: [place]   Lot: [id number]   [place]: [id number]

## 2020-01-24 ENCOUNTER — Ambulatory Visit: Payer: Medicare Other

## 2020-02-06 ENCOUNTER — Ambulatory Visit: Payer: Medicare Other | Attending: Internal Medicine

## 2020-02-06 DIAGNOSIS — Z23 Encounter for immunization: Secondary | ICD-10-CM | POA: Insufficient documentation

## 2020-02-06 NOTE — Progress Notes (Signed)
   Covid-19 Vaccination Clinic  Name:  Gregory Shepherd    MRN: [id number] DOB: 1946-09-07  02/06/2020  [name] was observed post Covid-19 immunization for 15 minutes without incident. He was provided with Vaccine Information Sheet and instruction to access the V-Safe system.   [name] was instructed to call 911 with any severe reactions post vaccine: Marland Kitchen Difficulty breathing  . Swelling of face and throat  . A fast heartbeat  . A bad rash all over body  . Dizziness and weakness   Immunizations Administered    Name Date Dose VIS Date Route   Pfizer COVID-19 Vaccine 02/06/2020 10:18 AM 0.3 mL 11/16/2019 Intramuscular   Manufacturer: [place]   Lot: [id number]   [place]: [id number]

## 2020-03-17 ENCOUNTER — Other Ambulatory Visit: Payer: Self-pay | Admitting: Physician Assistant

## 2020-07-04 ENCOUNTER — Ambulatory Visit: Payer: Medicare Other | Admitting: Cardiology

## 2020-07-04 ENCOUNTER — Other Ambulatory Visit: Payer: Self-pay

## 2020-07-04 ENCOUNTER — Encounter: Payer: Self-pay | Admitting: Cardiology

## 2020-07-04 VITALS — BP 126/68 | HR 51 | Ht 75.0 in | Wt 201.6 lb

## 2020-07-04 DIAGNOSIS — I10 Essential (primary) hypertension: Secondary | ICD-10-CM

## 2020-07-04 DIAGNOSIS — I739 Peripheral vascular disease, unspecified: Secondary | ICD-10-CM | POA: Diagnosis not present

## 2020-07-04 DIAGNOSIS — Z951 Presence of aortocoronary bypass graft: Secondary | ICD-10-CM

## 2020-07-04 DIAGNOSIS — E785 Hyperlipidemia, unspecified: Secondary | ICD-10-CM

## 2020-07-04 NOTE — Patient Instructions (Signed)
Medication Instructions:  Your physician recommends that you continue on your current medications as directed. Please refer to the Current Medication list given to you today.  *If you need a refill on your cardiac medications before your next appointment, please call your pharmacy*   Follow-Up: At [facility], you and your health needs are our priority.  As part of our continuing mission to provide you with exceptional heart care, we have created designated Provider Care Teams.  These Care Teams include your primary Cardiologist (physician) and Advanced Practice Providers (APPs -  Physician Assistants and Nurse Practitioners) who all work together to provide you with the care you need, when you need it.  We recommend signing up for the patient portal called "MyChart".  Sign up information is provided on this After Visit Summary.  MyChart is used to connect with patients for Virtual Visits (Telemedicine).  Patients are able to view lab/test results, encounter notes, upcoming appointments, etc.  Non-urgent messages can be sent to your provider as well.   To learn more about what you can do with MyChart, go to [email or web address].    Your next appointment:   6 month(s)  The format for your next appointment:   In Person  Provider:   You may see Quay Burow, MD or one of the following Advanced Practice Providers on your designated Care Team:    Kerin Ransom, PA-C  [place], [place]  Coletta Memos, [place]    Other Instructions Please call our office 2 months in advance to schedule your appointment with Dr. Gwenlyn Found.

## 2020-07-04 NOTE — Assessment & Plan Note (Signed)
[facility] PTA June 2020, residual 60-70% LCIA Dopplers stable Jan 2021

## 2020-07-04 NOTE — Progress Notes (Signed)
Cardiology Office Note:    Date:  07/04/2020   ID:  Gregory, Shepherd 1946/10/07, MRN [id number]  PCP:  Gregory Ruths, MD  Cardiologist:  Gregory Burow, MD  Electrophysiologist:  None   Referring MD: Gregory Ruths, MD   CC:  Increased HR  History of Present Illness:    Gregory Shepherd is a 74 y.o. male with a hx of CAD, status post CABG at [facility] in 1990 and redo surgery in 1998. Is done well since without angina. He has vascular disease and has been followed by Dr. Gwenlyn Shepherd. In June 2020 he underwent right common iliac artery PTA. He has residual 70% left common iliac artery. The patient tells me that he went from walking half a block a day to up to 10 miles a day. Other medical issues include treated hypertension and dyslipidemia and history of smoking. He is here for routine 77-month checkup. His LE a Dopplers in January 2021 look good, they were unchanged.  Past Medical History:  Diagnosis Date  . Carotid artery narrowing   . Hyperlipidemia   . Hypertension     Past Surgical History:  Procedure Laterality Date  . ABDOMINAL AORTOGRAM W/LOWER EXTREMITY N/A 06/04/2019   Procedure: ABDOMINAL AORTOGRAM W/LOWER EXTREMITY;  Surgeon: Lorretta Harp, MD;  Location: [place] [facility];  Service: Cardiovascular;  Laterality: N/A;  . CORONARY ARTERY BYPASS GRAFT  1990   x 2 at [facility], redo x 3 @ [place] approx 1998  . PERIPHERAL VASCULAR ATHERECTOMY Right 06/04/2019   Procedure: PERIPHERAL VASCULAR ATHERECTOMY;  Surgeon: Lorretta Harp, MD;  Location: [place] [facility];  Service: Cardiovascular;  Laterality: Right;  common iliac  . PERIPHERAL VASCULAR INTERVENTION Right 06/04/2019   Procedure: PERIPHERAL VASCULAR INTERVENTION;  Surgeon: Lorretta Harp, MD;  Location: [place] [facility];  Service: Cardiovascular;  Laterality: Right;  common iliac    Current Medications: Current Meds  Medication Sig  . amLODipine-benazepril (LOTREL) 5-10 MG capsule Take 1 capsule by  mouth daily.  Marland Kitchen aspirin 81 MG chewable tablet Chew by mouth daily.  Marland Kitchen atenolol (TENORMIN) 25 MG tablet Take 25 mg by mouth daily.  Marland Kitchen atorvastatin (LIPITOR) 80 MG tablet Take 80 mg by mouth daily.  . cholecalciferol (VITAMIN D3) 25 MCG (1000 UT) tablet Take 1,000 Units by mouth daily.  . clopidogrel (PLAVIX) 75 MG tablet TAKE 1 TABLET BY MOUTH ONCE DAILY WITH BREAKFAST  . finasteride (PROSCAR) 5 MG tablet Take 1 tablet (5 mg total) by mouth daily.  Marland Kitchen gabapentin (NEURONTIN) 300 MG capsule Take 1 capsule (300 mg total) by mouth 3 (three) times daily.  . isosorbide mononitrate (IMDUR) 60 MG 24 hr tablet Take 60 mg by mouth daily.  . Multiple Vitamin (MULTIVITAMIN) tablet Take 1 tablet by mouth daily.  . nitroGLYCERIN (NITROLINGUAL) 0.4 MG/SPRAY spray Place 1 spray under the tongue every 5 (five) minutes x 3 doses as needed for chest pain.  . vitamin B-12 (CYANOCOBALAMIN) 1000 MCG tablet Take 1,000 mcg by mouth daily.     Allergies:   Other   Social History   Socioeconomic History  . Marital status: Married    Spouse name: Not on file  . Number of children: Not on file  . Years of education: Not on file  . Highest education level: Not on file  Occupational History  . Not on file  Tobacco Use  . Smoking status: Current Every Day Smoker  . Smokeless tobacco: Never Used  . Tobacco  comment: 8-9 cigarettes per day  Vaping Use  . Vaping Use: Never used  Substance and Sexual Activity  . Alcohol use: No  . Drug use: No  . Sexual activity: Not on file  Other Topics Concern  . Not on file  Social History Narrative  . Not on file   Social Determinants of Health   Financial Resource Strain:   . Difficulty of Paying Living Expenses:   Food Insecurity:   . Worried About Charity fundraiser in the Last Year:   . Arboriculturist in the Last Year:   Transportation Needs:   . Film/video editor (Medical):   Marland Kitchen Lack of Transportation (Non-Medical):   Physical Activity:   . Days of  Exercise per Week:   . Minutes of Exercise per Session:   Stress:   . Feeling of Stress :   Social Connections:   . Frequency of Communication with Friends and Family:   . Frequency of Social Gatherings with Friends and Family:   . Attends Religious Services:   . Active Member of Clubs or Organizations:   . Attends Archivist Meetings:   Marland Kitchen Marital Status:      Family History: The patient's family history is negative for Prostate cancer, Bladder Cancer, and Kidney cancer.  ROS:   Please see the history of present illness.   Some upper back and extrity pain when recumbent sounds M/S in nature  All other systems reviewed and are negative.  EKGs/Labs/Other Studies Reviewed:    The following studies were reviewed today: LEA dopplers 12/25/2019- Right ABIs appear slightly increased compared to prior study on 06/21/19,  now in the normal range. Left ABIs appear essentially unchanged compared  to prior study on 06/21/19.    Summary:  Right: Resting right ankle-brachial index is within normal range. No  evidence of significant right lower extremity arterial disease. The right  toe-brachial index is normal.   Left: Resting left ankle-brachial index indicates mild left lower  extremity arterial disease. The left toe-brachial index is normal.   EKG:  EKG is not ordered today.  The ekg ordered 12/25/2019 demonstrates NSR, SB, RBBB, LAFB  Recent Labs: No results Shepherd for requested labs within last 8760 hours.  Recent Lipid Panel No results Shepherd for: CHOL, TRIG, HDL, CHOLHDL, VLDL, LDLCALC, LDLDIRECT  Physical Exam:    VS:  BP 126/68   Pulse 51   Ht 6\' 3"  (1.905 m)   Wt 201 lb 9.6 oz (91.4 kg)   BMI 25.20 kg/m     Wt Readings from Last 3 Encounters:  07/04/20 201 lb 9.6 oz (91.4 kg)  12/25/19 205 lb (93 kg)  07/10/19 200 lb 9.6 oz (91 kg)     GEN:  Well nourished, well developed in no acute distress HEENT: Normal NECK: No JVD; No carotid bruits LYMPHATICS: No  lymphadenopathy CARDIAC: RRR, soft systolic murmur AOV, preserved S2,no rubs, gallops RESPIRATORY:  Clear to auscultation without rales, wheezing or rhonchi  ABDOMEN: Soft, non-tender, non-distended MUSCULOSKELETAL:  No edema; No deformity  SKIN: Warm and dry NEUROLOGIC:  Alert and oriented x 3 PSYCHIATRIC:  Normal affect   ASSESSMENT:    Peripheral arterial disease ([place]) RCI PTA June 2020, residual 60-70% LCIA Dopplers stable Jan 2021  Hx of CABG H/O CABG x 3 at [facility] in 1990 with re do CABG x 3 at [facility] in 1998. Exertional chest pain is not present and patient is tolerating the prescribed medical regimen.  Essential (  primary) hypertension Controlled  Dyslipidemia, goal LDL below 70 On high dose statin rx  PLAN:    1. No change in Rx- f/u dr Gregory Shepherd in 6 months.   Medication Adjustments/Labs and Tests Ordered: Current medicines are reviewed at length with the patient today.  Concerns regarding medicines are outlined above.  No orders of the defined types were placed in this encounter.  No orders of the defined types were placed in this encounter.   Patient Instructions  Medication Instructions:  Your physician recommends that you continue on your current medications as directed. Please refer to the Current Medication list given to you today.  *If you need a refill on your cardiac medications before your next appointment, please call your pharmacy*   Follow-Up: At [facility], you and your health needs are our priority.  As part of our continuing mission to provide you with exceptional heart care, we have created designated Provider Care Teams.  These Care Teams include your primary Cardiologist (physician) and Advanced Practice Providers (APPs -  Physician Assistants and Nurse Practitioners) who all work together to provide you with the care you need, when you need it.  We recommend signing up for the patient portal called "MyChart".  Sign up information is provided on  this After Visit Summary.  MyChart is used to connect with patients for Virtual Visits (Telemedicine).  Patients are able to view lab/test results, encounter notes, upcoming appointments, etc.  Non-urgent messages can be sent to your provider as well.   To learn more about what you can do with MyChart, go to [email or web address].    Your next appointment:   6 month(s)  The format for your next appointment:   In Person  Provider:   You may see Gregory Burow, MD or one of the following Advanced Practice Providers on your designated Care Team:    Kerin Ransom, PA-C  [place], [place]  Coletta Memos, [place]    Other Instructions Please call our office 2 months in advance to schedule your appointment with Dr. Gwenlyn Shepherd.     Angelena Form, PA-C  07/04/2020 12:34 PM     [facility]

## 2020-07-04 NOTE — Assessment & Plan Note (Signed)
H/O CABG x 3 at [facility] in 1990 with re do CABG x 3 at [facility] in 1998. Exertional chest pain is not present and patient is tolerating the prescribed medical regimen.

## 2020-07-04 NOTE — Assessment & Plan Note (Signed)
On high dose statin rx 

## 2020-07-04 NOTE — Assessment & Plan Note (Signed)
Controlled.  

## 2020-07-15 NOTE — Addendum Note (Signed)
Addended by: Hinton Dyer on: 07/15/2020 02:51 PM   Modules accepted: Orders

## 2020-08-04 ENCOUNTER — Other Ambulatory Visit: Payer: Self-pay

## 2020-08-04 ENCOUNTER — Encounter: Payer: Self-pay | Admitting: Emergency Medicine

## 2020-08-04 ENCOUNTER — Emergency Department
Admission: EM | Admit: 2020-08-04 | Discharge: 2020-08-04 | Disposition: A | Discharge status: Home / Self Care | Payer: Medicare Other | Attending: Emergency Medicine | Admitting: Emergency Medicine

## 2020-08-04 DIAGNOSIS — Z5321 Procedure and treatment not carried out due to patient leaving prior to being seen by health care provider: Secondary | ICD-10-CM | POA: Insufficient documentation

## 2020-08-04 DIAGNOSIS — R079 Chest pain, unspecified: Secondary | ICD-10-CM | POA: Insufficient documentation

## 2020-08-04 DIAGNOSIS — R202 Paresthesia of skin: Secondary | ICD-10-CM | POA: Insufficient documentation

## 2020-08-04 HISTORY — DX: Acute myocardial infarction, unspecified: I21.9

## 2020-08-04 LAB — CBC WITH DIFFERENTIAL/PLATELET
Abs Immature Granulocytes: 0.02 10*3/uL (ref 0.00–0.07)
Basophils Absolute: 0.1 10*3/uL (ref 0.0–0.1)
Basophils Relative: 2 %
Eosinophils Absolute: 0.2 10*3/uL (ref 0.0–0.5)
Eosinophils Relative: 2 %
HCT: 39.8 % (ref 39.0–52.0)
Hemoglobin: 13.7 g/dL (ref 13.0–17.0)
Immature Granulocytes: 0 %
Lymphocytes Relative: 22 %
Lymphs Abs: 1.8 10*3/uL (ref 0.7–4.0)
MCH: 31.1 pg (ref 26.0–34.0)
MCHC: 34.4 g/dL (ref 30.0–36.0)
MCV: 90.5 fL (ref 80.0–100.0)
Monocytes Absolute: 0.8 10*3/uL (ref 0.1–1.0)
Monocytes Relative: 10 %
Neutro Abs: 5.3 10*3/uL (ref 1.7–7.7)
Neutrophils Relative %: 64 %
Platelets: 171 10*3/uL (ref 150–400)
RBC: 4.4 MIL/uL (ref 4.22–5.81)
RDW: 13.9 % (ref 11.5–15.5)
WBC: 8.2 10*3/uL (ref 4.0–10.5)
nRBC: 0 % (ref 0.0–0.2)

## 2020-08-04 LAB — COMPREHENSIVE METABOLIC PANEL
ALT: 25 U/L (ref 0–44)
AST: 17 U/L (ref 15–41)
Albumin: 3.9 g/dL (ref 3.5–5.0)
Alkaline Phosphatase: 65 U/L (ref 38–126)
Anion gap: 9 (ref 5–15)
BUN: 9 mg/dL (ref 8–23)
CO2: 23 mmol/L (ref 22–32)
Calcium: 8.8 mg/dL — ABNORMAL LOW (ref 8.9–10.3)
Chloride: 105 mmol/L (ref 98–111)
Creatinine, Ser: 0.83 mg/dL (ref 0.61–1.24)
GFR calc Af Amer: >60 mL/min (ref >60)
GFR calc non Af Amer: >60 mL/min (ref >60)
Glucose, Bld: 114 mg/dL — ABNORMAL HIGH (ref 70–99)
Potassium: 4 mmol/L (ref 3.5–5.1)
Sodium: 137 mmol/L (ref 135–145)
Total Bilirubin: 0.8 mg/dL (ref 0.3–1.2)
Total Protein: 6.5 g/dL (ref 6.5–8.1)

## 2020-08-04 LAB — TROPONIN I (HIGH SENSITIVITY): Troponin I (High Sensitivity): 9 ng/L (ref <18)

## 2020-08-04 NOTE — ED Triage Notes (Signed)
Pt presents to ED with sudden onset of mid chest pain around 1700 and numbness to his left arm. Pt states he has a cardiac hx of MI and two open heart surgeries. Pt reports was unsure if his pain was his heart or indigestion so he took nitro and pepcid to see if his symptoms would improve. Currently symptoms have improved from 8/10 to 2/10.

## 2020-08-20 ENCOUNTER — Other Ambulatory Visit: Payer: Self-pay | Admitting: Internal Medicine

## 2020-08-20 ENCOUNTER — Other Ambulatory Visit (HOSPITAL_COMMUNITY): Payer: Self-pay | Admitting: Internal Medicine

## 2020-08-20 DIAGNOSIS — R1013 Epigastric pain: Secondary | ICD-10-CM

## 2020-08-26 ENCOUNTER — Other Ambulatory Visit: Payer: Self-pay

## 2020-08-26 ENCOUNTER — Ambulatory Visit
Admission: RE | Admit: 2020-08-26 | Discharge: 2020-08-26 | Disposition: A | Discharge status: Home / Self Care | Payer: Medicare Other | Source: Ambulatory Visit | Attending: Internal Medicine | Admitting: Internal Medicine

## 2020-08-26 DIAGNOSIS — R1013 Epigastric pain: Secondary | ICD-10-CM | POA: Insufficient documentation

## 2020-10-27 ENCOUNTER — Other Ambulatory Visit (HOSPITAL_COMMUNITY): Payer: Self-pay | Admitting: Physician Assistant

## 2020-10-27 ENCOUNTER — Other Ambulatory Visit: Payer: Self-pay | Admitting: Physician Assistant

## 2020-10-27 DIAGNOSIS — R1310 Dysphagia, unspecified: Secondary | ICD-10-CM

## 2020-10-31 ENCOUNTER — Other Ambulatory Visit: Payer: Self-pay

## 2020-10-31 ENCOUNTER — Ambulatory Visit
Admission: RE | Admit: 2020-10-31 | Discharge: 2020-10-31 | Disposition: A | Discharge status: Home / Self Care | Payer: Medicare Other | Source: Ambulatory Visit | Attending: Physician Assistant | Admitting: Physician Assistant

## 2020-10-31 DIAGNOSIS — R1310 Dysphagia, unspecified: Secondary | ICD-10-CM | POA: Diagnosis not present

## 2020-12-16 ENCOUNTER — Ambulatory Visit (HOSPITAL_COMMUNITY)
Admission: RE | Admit: 2020-12-16 | Discharge: 2020-12-16 | Disposition: A | Discharge status: Home / Self Care | Payer: Medicare Other | Source: Ambulatory Visit | Attending: Cardiovascular Disease | Admitting: Cardiovascular Disease

## 2020-12-16 ENCOUNTER — Ambulatory Visit (HOSPITAL_BASED_OUTPATIENT_CLINIC_OR_DEPARTMENT_OTHER)
Admission: RE | Admit: 2020-12-16 | Discharge: 2020-12-16 | Disposition: A | Discharge status: Home / Self Care | Payer: Medicare Other | Source: Ambulatory Visit | Attending: Cardiovascular Disease | Admitting: Cardiovascular Disease

## 2020-12-16 ENCOUNTER — Other Ambulatory Visit: Payer: Self-pay

## 2020-12-16 DIAGNOSIS — I771 Stricture of artery: Secondary | ICD-10-CM

## 2020-12-16 DIAGNOSIS — I739 Peripheral vascular disease, unspecified: Secondary | ICD-10-CM | POA: Diagnosis present

## 2020-12-16 DIAGNOSIS — Z95828 Presence of other vascular implants and grafts: Secondary | ICD-10-CM

## 2020-12-17 ENCOUNTER — Telehealth: Payer: Self-pay

## 2020-12-17 ENCOUNTER — Encounter: Payer: Self-pay | Admitting: Gastroenterology

## 2020-12-17 ENCOUNTER — Ambulatory Visit (INDEPENDENT_AMBULATORY_CARE_PROVIDER_SITE_OTHER): Payer: Medicare Other | Admitting: Gastroenterology

## 2020-12-17 VITALS — BP 144/58 | HR 51 | Ht 75.0 in | Wt 196.2 lb

## 2020-12-17 DIAGNOSIS — R1013 Epigastric pain: Secondary | ICD-10-CM | POA: Diagnosis not present

## 2020-12-17 DIAGNOSIS — Z86010 Personal history of colonic polyps: Secondary | ICD-10-CM

## 2020-12-17 DIAGNOSIS — R1319 Other dysphagia: Secondary | ICD-10-CM

## 2020-12-17 NOTE — Telephone Encounter (Signed)
[facility] Pre-operative Risk Assessment     Request for surgical clearance:     Endoscopy Procedure  What type of surgery is being performed?     EGD and Colonoscopy  When is this surgery scheduled?     02/06/21  What type of clearance is required ?   BOTH CARDIAC CLEARANCE AND PHARMACY  Are there any medications that need to be held prior to surgery and how long? Plavix x5 days  Practice name and name of physician performing surgery?      [facility]  What is your office phone and fax number?      Phone- [phone]  [phone]  Anesthesia type (None, local, MAC, general) ?       MAC

## 2020-12-17 NOTE — Patient Instructions (Addendum)
COLONOSCOPY AND ENDOSCOPY: You have been scheduled for an endoscopy and colonoscopy. Please follow the written instructions given to you at your visit today.  NOTE - WE WILL REQUEST CARDIAC CLEARANCE AND PERMISSION FROM YOUR PROVIDER ABOUT YOUR PLAVIX  PREP: Please pick up your prep supplies at the pharmacy within the next 1-3 days.  INHALERS: If you use inhalers (even only as needed), please bring them with you on the day of your procedure.  If you are age 43 or older, your body mass index should be between 23-30. Your There is no height or weight on file to calculate BMI. If this is out of the aforementioned range listed, please consider follow up with your Primary Care Provider.  Thank you for trusting me with your gastrointestinal care!    Thornton Park, MD, MPH

## 2020-12-17 NOTE — Telephone Encounter (Signed)
   Primary Cardiologist: Quay Burow, MD  Chart reviewed as part of pre-operative protocol coverage. Patient was contacted 12/17/2020 in reference to pre-operative risk assessment for pending surgery as outlined below.  Gregory Shepherd was last seen on 07/04/2020 by Dr. Gwenlyn Found.  Since that day, Gregory Shepherd has done well without exertional chest pain or shortness of breath.  Therefore, based on ACC/AHA guidelines, the patient would be at acceptable risk for the planned procedure without further cardiovascular testing.   The patient was advised that if he develops new symptoms prior to surgery to contact our office to arrange for a follow-up visit, and he verbalized understanding.  Awaiting Dr. Kennon Holter approval to hold plavix.  [place], [place] 12/17/2020, 5:10 PM

## 2020-12-17 NOTE — Progress Notes (Signed)
Referring Provider: Kirk Ruths, MD Primary Care Physician:  Kirk Ruths, MD  Reason for Consultation:  Epigastric pain   IMPRESSION:  Epigastric pain and heartburn not responding to PPI therapy QHS with Pepcid AC and TUMS Dysphagia H pylori blood antigen IgG elevated at 1.12, treated with antibiotics 2020 Fit+ in 2019 with endoscopic evaluation at that time History of colon polyps - tubular adenoma, 16 hyperplastic polyps on only colonoscopy 2012 Multiple concurrent complaints: Fatigue, dizziness, lower extremity edema, bradycardia On long-term Plavix use: Prescribed by Dr. Dian Queen:  Upper endoscopy recommended given his persistent symptoms despite daily lansoprazole, Pepcid AC, and Tums.  Differential includes reflux esophagitis, H. pylori gastritis, gastritis, gastric outlet obstruction, gastroparesis, and malignancy.    Given his recent symptoms, significant cardiac history, and ongoing need for Plavix will request cardiac clearance from Dr. Gwenlyn Found.  Ideally would hold  Plavix for 5 days before endoscopy.  I discussed with the patient that there is a low, but real, risk of a cardiovascular event such as heart attack, stroke, or embolism/thrombosis while off Plavix.  [name] has an appointment with Dr. Gwenlyn Found next month.  He is due surveillance colonoscopy and would like to proceed at this time.  PLAN: Increase lansoprazole to 30 mg BID, continue Pepcid AC QHS and Tums as needed EGD Colonoscopy after a Plavix washout and Cardiology Clearance (given his bradycardia) Contrasted cross-sectional imaging if endoscopy is negative  Please see the "Patient Instructions" section for addition details about the plan.  HPI: Gregory Shepherd is a 75 y.o. male referred by Dr. Ouida Sills for further evaluation of epigastric pain. The history is obtained through the patient and review of his electronic health record.  He has CAD s/p CABG 1990 and redo surgery 1998 and most  recent PTA 05/2019. He has been on Plavix since that time. He also has DM 2, HTN, PVD, HLD, psuedoclaudication, cholelithiasis, BPH, and was recently treated for acute pharyngitis and sinusitis with azithrmoycgin.  He also has an elevated HgbA1C with glucose levels controlled by diet. Longstanding history of omeprazole use, maybe for reflux, although he doesn't actually remember. Stopped omeprazole when he first started Plavix out of safety concerns.   He reports 2 months heartburn and indigestion with epigastric pain and associated nausea. Heartburn starts 2 hours after eating. Feels like a pressure in the chest.  Also feels a fullness when he eats at the sternal notch. Feels the food bolus lodge. Appetite is poor. He has lost 10 pounds over the last 2 months.  Removed cafeeine, chocolate, and spciy foods. Worse when recline. Worse when he takes a nap around 3-4 in the afternoon and with supine position at night. Takes Pepcid AC at bedtime and then 4 TUMS.  When his recent symptoms started, he started lansoprazole taken prior to dinner with no significant change in symptoms. ThroatCalm OTC has helped with the dysphagia. No odynophagia. No dysphonia, coughing, choking, and sore throat. Asythromycin for pharngitis and sinusitis.   Also having dizzy spells. Fatigue, and lower extremity edema. Dizziness precedes his lunchtime meal. Afraid to drive given his dizziness.  He is also concerned about anesthesia effects on his baseline bradycardia.  Has follow-up with Dr. Gwenlyn Found in early February. No recent echocardiogram.   Recent evaluation of his symptoms has included: - Abdominal ultrasound 08/26/20: cholelithiasis. Pancreas not visualized.  - H pylori blood antigen IgG elevated at 1.12 - treated for one week with PPI and 2 antibiotics - normal liver enzymes and lipase  08/18/20 - normal CBC except for hemoglobin 13.9, MCV 92.1, RDW 13.8, platelets 196 without peripheral eosinophilia - low immunoglobulin E,  allergy testing negative, awaiting an immunologist at [facility] 10/31/20: penetration without aspiration; no esophageal abnormalities. Experienced burning with effervescent crystals.  - Cologuard was ordered by cancelled  Previously followed by Dr. Gustavo Lah. EGD and colonoscopy done with Dr. Gustavo Lah 10/19/2011.  Colonoscopy revealed 17 polyps out of which one was a tubular adenoma.  The EGD revealed reflux esophagitis, gastritis, duodenitis, and a sliding hiatal hernia.  He had moderate active esophagitis.  He had a positive fit test in 2019.  He was seen by NP [place] in the [facility] GI office but declined any further evaluation due to prioritizing ongoing cardiac care. He attributed the positive test to blood from known hemorrhoids.   Mother and sister with breast cancer. No known family history of colon cancer or polyps. No family history of uterine/endometrial cancer, pancreatic cancer or gastric/stomach cancer.   Past Medical History:  Diagnosis Date  . Carotid artery narrowing   . History of blood clots   . History of colon polyps   . Hyperlipidemia   . Hypertension   . MI [facility]myocardial infarction) [facility]     Past Surgical History:  Procedure Laterality Date  . ABDOMINAL AORTOGRAM W/LOWER EXTREMITY N/A 06/04/2019   Procedure: ABDOMINAL AORTOGRAM W/LOWER EXTREMITY;  Surgeon: Lorretta Harp, MD;  Location: [place] [facility];  Service: Cardiovascular;  Laterality: N/A;  . CARDIAC SURGERY     x2   . COLONOSCOPY     Age 41. [place]   x 2 at [facility], redo x 3 @ [place] approx 1998  . PERIPHERAL VASCULAR ATHERECTOMY Right 06/04/2019   Procedure: PERIPHERAL VASCULAR ATHERECTOMY;  Surgeon: Lorretta Harp, MD;  Location: [place] [facility];  Service: Cardiovascular;  Laterality: Right;  common iliac  . PERIPHERAL VASCULAR INTERVENTION Right 06/04/2019   Procedure: PERIPHERAL VASCULAR INTERVENTION;  Surgeon: Lorretta Harp, MD;  Location: [place] [facility];  Service: Cardiovascular;  Laterality: Right;  common iliac    Current Outpatient Medications  Medication Sig Dispense Refill  . aspirin 81 MG chewable tablet Chew by mouth daily.    Marland Kitchen atenolol (TENORMIN) 25 MG tablet Take 25 mg by mouth daily.    Marland Kitchen atorvastatin (LIPITOR) 80 MG tablet Take 80 mg by mouth daily.    . cholecalciferol (VITAMIN D3) 25 MCG (1000 UT) tablet Take 1,000 Units by mouth daily.    . clopidogrel (PLAVIX) 75 MG tablet TAKE 1 TABLET BY MOUTH ONCE DAILY WITH BREAKFAST 30 tablet 8  . finasteride (PROSCAR) 5 MG tablet Take 1 tablet (5 mg total) by mouth daily. 90 tablet 0  . gabapentin (NEURONTIN) 300 MG capsule Take 1 capsule (300 mg total) by mouth 3 (three) times daily. 270 capsule 1  . isosorbide mononitrate (IMDUR) 60 MG 24 hr tablet Take 60 mg by mouth daily.    . Multiple Vitamin (MULTIVITAMIN) tablet Take 1 tablet by mouth daily.    . nitroGLYCERIN (NITROLINGUAL) 0.4 MG/SPRAY spray Place 1 spray under the tongue every 5 (five) minutes x 3 doses as needed for chest pain.    . vitamin B-12 (CYANOCOBALAMIN) 1000 MCG tablet Take 1,000 mcg by mouth daily.    Marland Kitchen amLODipine (NORVASC) 5 MG tablet Take 5 mg by mouth daily.    . lansoprazole (PREVACID) 30 MG capsule Take 30 mg by  mouth daily.    . nitroGLYCERIN (NITROSTAT) 0.4 MG SL tablet as needed.    . traZODone (DESYREL) 50 MG tablet 0.5 tablets 2 (two) times daily.     No current facility-administered medications for this visit.    Allergies as of 12/17/2020 - Review Complete 12/17/2020  Allergen Reaction Noted  . Omeprazole  12/17/2020  . Other Palpitations 05/28/2019    Family History  Problem Relation Age of Onset  . Heart disease Mother   . Heart disease Father   . Diabetes Sister   . Prostate cancer Neg Hx   . Bladder Cancer Neg Hx   . Kidney cancer Neg Hx   . Colon cancer Neg Hx   . Esophageal cancer Neg Hx     Social History   Socioeconomic History   . Marital status: Married    Spouse name: Not on file  . Number of children: Not on file  . Years of education: Not on file  . Highest education level: Not on file  Occupational History  . Not on file  Tobacco Use  . Smoking status: Current Every Day Smoker    Types: Cigarettes  . Smokeless tobacco: Never Used  . Tobacco comment: 8-9 cigarettes per day  Vaping Use  . Vaping Use: Never used  Substance and Sexual Activity  . Alcohol use: No  . Drug use: No  . Sexual activity: Not on file  Other Topics Concern  . Not on file  Social History Narrative  . Not on file   Social Determinants of Health   Financial Resource Strain: Not on file  Food Insecurity: Not on file  Transportation Needs: Not on file  Physical Activity: Not on file  Stress: Not on file  Social Connections: Not on file  Intimate Partner Violence: Not on file    Review of Systems: 12 system ROS is negative except as noted above with the additions of fatigue, headaches, lower extremity edema, and excessive urination.   Physical Exam: General:   Alert,  well-nourished, pleasant and cooperative in NAD Head:  Normocephalic and atraumatic. Eyes:  Sclera clear, no icterus.   Conjunctiva pink. Ears:  Normal auditory acuity. Nose:  No deformity, discharge,  or lesions. Mouth:  No deformity or lesions.   Neck:  Supple; no masses or thyromegaly. Lungs:  Clear throughout to auscultation.   No wheezes.  Well healed sternal scar. Heart:  Regular rate and rhythm; systolic murmur Abdomen:  Soft, nontender, nondistended, normal bowel sounds, no rebound or guarding. No hepatosplenomegaly.   Rectal:  Deferred  Msk:  Symmetrical. No boney deformities LAD: No inguinal or umbilical LAD Extremities:  No clubbing. 2+ LE edema.  Neurologic:  Alert and  oriented x4;  grossly nonfocal Skin:  Intact without significant lesions or rashes. Psych:  Alert and cooperative. Normal mood and affect.   Madonna Flegal L. Tarri Glenn, MD,  MPH 12/17/2020, 3:53 PM

## 2020-12-18 ENCOUNTER — Other Ambulatory Visit: Payer: Self-pay

## 2020-12-18 MED ORDER — LANSOPRAZOLE 30 MG PO CPDR: 30.0000 mg | DELAYED_RELEASE_CAPSULE | Freq: Two times a day (BID) | ORAL | 3 refills | Status: DC

## 2020-12-18 NOTE — Telephone Encounter (Signed)
Appointment Information  Name: Gregory Shepherd, Gregory Shepherd MRN: [id number]  Date: 02/06/2021 Status: Sch  Time: 1:30 PM Length: 60  Visit Type: PROPOFOL ENDO COL [1273] Copay: $0.00  Provider: Thornton Park, MD Department: [facility]  Referring Provider: Kirk Ruths CSN: [id number]  Notes: Covid vacc'd; Epigastric pain, dysphagia, history of colon polyps/ae  Made On: Change Notes: 12/17/2020 3:11 PM 12/17/2020 3:21 PM By: By: Cheral Bay, AMMIE J

## 2020-12-18 NOTE — Telephone Encounter (Signed)
OK to hold plavix for surgery

## 2020-12-18 NOTE — Telephone Encounter (Signed)
Received notification from Dr. Gwenlyn Found indicating pt is cleared for surgery and permitted to hold Plavix x5 days. Also noted his staff member called pt to make him aware. Called pt to verify that he did receive the call and that he was informed about these instructions. Pt confirmed he received the call and instructions. Did not have any questions or concerns at this time. Routing to Dr. Tarri Glenn for continuity purposes.

## 2020-12-18 NOTE — Telephone Encounter (Signed)
Callback pool, please inform the patient to hold Plavix for 5 days prior to the procedure.  He is cleared.

## 2020-12-18 NOTE — Telephone Encounter (Signed)
Call and spoke with pt advised him to hold Plavix 5 days prior to procedure, pt voice understanding and thanks

## 2020-12-18 NOTE — Telephone Encounter (Signed)
Please scheduled EGD and colonoscopy. Thanks.

## 2020-12-29 ENCOUNTER — Other Ambulatory Visit: Payer: Self-pay | Admitting: Physician Assistant

## 2021-01-14 ENCOUNTER — Other Ambulatory Visit: Payer: Self-pay

## 2021-01-14 ENCOUNTER — Ambulatory Visit (INDEPENDENT_AMBULATORY_CARE_PROVIDER_SITE_OTHER): Payer: Medicare Other | Admitting: Cardiovascular Disease

## 2021-01-14 ENCOUNTER — Encounter: Payer: Self-pay | Admitting: Cardiovascular Disease

## 2021-01-14 DIAGNOSIS — E785 Hyperlipidemia, unspecified: Secondary | ICD-10-CM

## 2021-01-14 DIAGNOSIS — Z951 Presence of aortocoronary bypass graft: Secondary | ICD-10-CM

## 2021-01-14 DIAGNOSIS — R600 Localized edema: Secondary | ICD-10-CM | POA: Insufficient documentation

## 2021-01-14 DIAGNOSIS — I739 Peripheral vascular disease, unspecified: Secondary | ICD-10-CM | POA: Diagnosis not present

## 2021-01-14 DIAGNOSIS — I10 Essential (primary) hypertension: Secondary | ICD-10-CM

## 2021-01-14 MED ORDER — FUROSEMIDE 20 MG PO TABS: 20.0000 mg | ORAL_TABLET | Freq: Every day | ORAL | 3 refills | Status: DC

## 2021-01-14 MED ORDER — POTASSIUM CHLORIDE ER 10 MEQ PO TBCR: 10.0000 meq | EXTENDED_RELEASE_TABLET | Freq: Every day | ORAL | 3 refills | Status: DC

## 2021-01-14 NOTE — Assessment & Plan Note (Signed)
Gregory Shepherd has noticed bilateral lower extremity edema over the last 6 months right greater than left.  I am going to order furosemide 20 mg a day along with K. Dur 10 mEq a day.  We will check a BMET  today and again in 7 to 10 days.  And can obtain a 2D echo to assess for LV dysfunction in order knee-high compression stockings 20 to 30 mmHg.  I will see him back in 3 months for follow-up.

## 2021-01-14 NOTE — Assessment & Plan Note (Signed)
History of dyslipidemia on high-dose atorvastatin with recent lipid profile performed by his PCP 12/30/2020 revealing total cholesterol 90, LDL 42 and HDL of 28.

## 2021-01-14 NOTE — Patient Instructions (Signed)
Medication Instructions:   -Start furosemide 20mg  once daily.  -Start potassium chloride 30meq daily with furosemide.  *If you need a refill on your cardiac medications before your next appointment, please call your pharmacy*   Lab Work: Your physician recommends that you labs drawn today: BMET  Your physician recommends that you return for lab work in: 7-10 days- BMET   If you have labs (blood work) drawn today and your tests are completely normal, you will receive your results only by: Marland Kitchen MyChart Message (if you have MyChart) OR . A paper copy in the mail If you have any lab test that is abnormal or we need to change your treatment, we will call you to review the results.   Testing/Procedures: Your physician has requested that you have an echocardiogram. Echocardiography is a painless test that uses sound waves to create images of your heart. It provides your doctor with information about the size and shape of your heart and how well your heart's chambers and valves are working. This procedure takes approximately one hour. There are no restrictions for this procedure. This procedure is done at [address] physician has requested that you have a lower extremity arterial duplex. This test is an ultrasound of the arteries in the legs. It looks at arterial blood flow in the legs. Allow one hour for Lower Arterial scans. There are no restrictions or special instructions. This procedure is done at [facility]. 2nd Floor. To be done Feb. 2023   Follow-Up: At [facility], you and your health needs are our priority.  As part of our continuing mission to provide you with exceptional heart care, we have created designated Provider Care Teams.  These Care Teams include your primary Cardiologist (physician) and Advanced Practice Providers (APPs -  Physician Assistants and Nurse Practitioners) who all work together to provide you with the care you need, when you need  it.  We recommend signing up for the patient portal called "MyChart".  Sign up information is provided on this After Visit Summary.  MyChart is used to connect with patients for Virtual Visits (Telemedicine).  Patients are able to view lab/test results, encounter notes, upcoming appointments, etc.  Non-urgent messages can be sent to your provider as well.   To learn more about what you can do with MyChart, go to [email or web address].    Your next appointment:   3 month(s)  The format for your next appointment:   In Person  Provider:   Quay Burow, MD   Other Instructions Dr. Gwenlyn Found recommends compression stockings 20-69mmHg.

## 2021-01-14 NOTE — Assessment & Plan Note (Signed)
History of PAD status post orbital atherectomy, PTA and covered stenting of a 99% calcified subtotally occluded right common iliac artery by myself 06/04/2019.  He did have 50 to 60% diffuse calcified distal abdominal aortic stenosis and 60 to 70% proximal left common iliac artery stenosis although he denies left lower extremity claudication.  His claudication resolved after the intervention.  His most recent Doppler studies performed 12/16/2020 revealed a right ABI of 0.98 and a left of 0.93.  He did have a patent right iliac stent with moderate left common iliac artery stenosis.  We will repeat Doppler studies on annual basis.

## 2021-01-14 NOTE — Progress Notes (Signed)
01/14/2021 DAT DERKSEN   1946/05/10  [id number]  Primary Physician Kirk Ruths, MD Primary Cardiologist: Lorretta Harp MD FACP, [place], [place], [place]  HPI:  Gregory Shepherd is a 75 y.o.   mild to moderately overweight married Caucasian male father of 2, grandfather of 3 grandchildrenwho I last saw in the office  12/25/2019.He was referred by Dr. Gardenia Phlegm for peripheral vascular valuation because of claudication. He is retired from being in [employer] 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 He has treated hypertension hyperlipidemia. His father did have a myocardial infarction. He had CABG x2 at [facility] in [place] by Dr. Eliberto Ivory. He did have a remote MI at age 13 as well. He had redo bypass surgery x3 at [facility] 8 years later. His major issue is with left lower extremity numbness and claudication. 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 He has high-frequency signals in both iliac arteries and monophasic waveforms below that on the right.  We proceeded with lower extremity angiography and intervention in 06/04/2019 revealing a 99% calcified ostial/proximal right common iliac artery stenosis and a 60 to 70% left common iliac artery stenosis. Performed diamondback orbital rotational atherectomy, PTA and stenting with an excellent result. His Dopplers normalized and his claudication resolved.  Since I saw him in the office 1 year ago he continues to do well.  He still denies claudication, or chest pain.  He has noticed right greater than left lower extremity edema especially at the ankle level over the last 6 months with some fatigue and mild dyspnea.  He had Doppler studies performed in our office 12/16/2020 revealing stable ABIs with a patent right iliac stent and moderate left  common iliac artery stenosis.   Current Meds  Medication Sig  . amLODipine (NORVASC) 5 MG tablet Take 5 mg by mouth daily.  Marland Kitchen aspirin 81 MG chewable tablet Chew by mouth daily.  Marland Kitchen atenolol (TENORMIN) 25 MG tablet Take 25 mg by mouth daily.  Marland Kitchen atorvastatin (LIPITOR) 80 MG tablet Take 80 mg by mouth daily.  . cholecalciferol (VITAMIN D3) 25 MCG (1000 UT) tablet Take 1,000 Units by mouth daily.  . clopidogrel (PLAVIX) 75 MG tablet TAKE 1 TABLET BY MOUTH ONCE DAILY WITH BREAKFAST  . finasteride (PROSCAR) 5 MG tablet Take 1 tablet (5 mg total) by mouth daily.  Marland Kitchen gabapentin (NEURONTIN) 300 MG capsule Take 1 capsule by mouth in the morning, at noon, in the evening, and at bedtime.  . isosorbide mononitrate (IMDUR) 60 MG 24 hr tablet Take 60 mg by mouth daily.  . lansoprazole (PREVACID) 30 MG capsule Take 1 capsule (30 mg total) by mouth 2 (two) times daily before a meal.  . Multiple Vitamin (MULTIVITAMIN) tablet Take 1 tablet by mouth daily.  . nitroGLYCERIN (NITROSTAT) 0.4 MG SL tablet as needed.  . traZODone (DESYREL) 50 MG tablet 0.5 tablets 2 (two) times daily.  . vitamin B-12 (CYANOCOBALAMIN) 1000 MCG tablet Take 1,000 mcg by mouth daily.  . [DISCONTINUED] gabapentin (NEURONTIN) 300 MG capsule Take 1 capsule (300 mg total) by mouth 3 (three) times daily. (Patient taking differently: Take 300 mg by mouth 4 (four) times daily.)     Allergies  Allergen Reactions  . Omeprazole     doesn't work well with plavix   .  Other Palpitations    Certain steroids give patient heart palpitations     Social History   Socioeconomic History  . Marital status: Married    Spouse name: Not on file  . Number of children: Not on file  . Years of education: Not on file  . Highest education level: Not on file  Occupational History  . Not on file  Tobacco Use  . Smoking status: Current Every Day Smoker    Types: Cigarettes  . Smokeless tobacco: Never Used  . Tobacco comment: 8-9 cigarettes per day   Vaping Use  . Vaping Use: Never used  Substance and Sexual Activity  . Alcohol use: No  . Drug use: No  . Sexual activity: Not on file  Other Topics Concern  . Not on file  Social History Narrative  . Not on file   Social Determinants of Health   Financial Resource Strain: Not on file  Food Insecurity: Not on file  Transportation Needs: Not on file  Physical Activity: Not on file  Stress: Not on file  Social Connections: Not on file  Intimate Partner Violence: Not on file     Review of Systems: General: negative for chills, fever, night sweats or weight changes.  Cardiovascular: negative for chest pain, dyspnea on exertion, edema, orthopnea, palpitations, paroxysmal nocturnal dyspnea or shortness of breath Dermatological: negative for rash Respiratory: negative for cough or wheezing Urologic: negative for hematuria Abdominal: negative for nausea, vomiting, diarrhea, bright red blood per rectum, melena, or hematemesis Neurologic: negative for visual changes, syncope, or dizziness All other systems reviewed and are otherwise negative except as noted above.    Blood pressure (!) 146/72, pulse (!) 51, height 6' 1.5" (1.867 m), weight 199 lb 3.2 oz (90.4 kg).  General appearance: alert and no distress Neck: no adenopathy, no carotid bruit, no JVD, supple, symmetrical, trachea midline and thyroid not enlarged, symmetric, no tenderness/mass/nodules Lungs: clear to auscultation bilaterally Heart: regular rate and rhythm, S1, S2 normal, no murmur, click, rub or gallop Extremities: 3+ right lower extremity mid, 2+ left Pulses: 2+ and symmetric Skin: Skin color, texture, turgor normal. No rashes or lesions Neurologic: Alert and oriented X 3, normal strength and tone. Normal symmetric reflexes. Normal coordination and gait  EKG sinus bradycardia 51 with right bundle branch block and left anterior fascicular block.  I personally reviewed this EKG.  ASSESSMENT AND PLAN:   Hx of  CABG History of CAD status post CABG x2 were taken [facility] by Dr. Rod Holler in 1990.  He had redo bypass surgery 8 years later at [facility].  He denies chest pain but does get some mild shortness of breath and feels fatigued.  Essential (primary) hypertension History of essential hypertension a blood pressure measured today 146/72.   Peripheral arterial disease (HCC) History of PAD status post orbital atherectomy, PTA and covered stenting of a 99% calcified subtotally occluded right common iliac artery by myself 06/04/2019.  He did have 50 to 60% diffuse calcified distal abdominal aortic stenosis and 60 to 70% proximal left common iliac artery stenosis although he denies left lower extremity claudication.  His claudication resolved after the intervention.  His most recent Doppler studies performed 12/16/2020 revealed a right ABI of 0.98 and a left of 0.93.  He did have a patent right iliac stent with moderate left common iliac artery stenosis.  We will repeat Doppler studies on annual basis.  Dyslipidemia, goal LDL below 70 History of dyslipidemia on high-dose atorvastatin with  recent lipid profile performed by his PCP 12/30/2020 revealing total cholesterol 90, LDL 42 and HDL of 28.  Bilateral lower extremity edema Mr. Albrecht has noticed bilateral lower extremity edema over the last 6 months right greater than left.  I am going to order furosemide 20 mg a day along with K. Dur 10 mEq a day.  We will check a BMET  today and again in 7 to 10 days.  And can obtain a 2D echo to assess for LV dysfunction in order knee-high compression stockings 20 to 30 mmHg.  I will see him back in 3 months for follow-up.      Lorretta Harp MD [place], [facility] 01/14/2021 1:02 PM

## 2021-01-14 NOTE — Assessment & Plan Note (Signed)
History of CAD status post CABG x2 were taken [facility] by Dr. Rod Holler in 1990.  He had redo bypass surgery 8 years later at [facility].  He denies chest pain but does get some mild shortness of breath and feels fatigued.

## 2021-01-14 NOTE — Assessment & Plan Note (Signed)
History of essential hypertension a blood pressure measured today 146/72.

## 2021-01-16 LAB — BASIC METABOLIC PANEL
BUN/Creatinine Ratio: 13 (ref 10–24)
BUN: 11 mg/dL (ref 8–27)
CO2: 23 mmol/L (ref 20–29)
Calcium: 9.4 mg/dL (ref 8.6–10.2)
Chloride: 101 mmol/L (ref 96–106)
Creatinine, Ser: 0.86 mg/dL (ref 0.76–1.27)
GFR calc Af Amer: 98 mL/min/{1.73_m2} (ref >59)
GFR calc non Af Amer: 85 mL/min/{1.73_m2} (ref >59)
Glucose: 102 mg/dL — ABNORMAL HIGH (ref 65–99)
Potassium: 4.1 mmol/L (ref 3.5–5.2)
Sodium: 137 mmol/L (ref 134–144)

## 2021-01-20 NOTE — Telephone Encounter (Signed)
If he does not not tolerate the furosemide and K-Dur we may try torsemide.

## 2021-02-03 ENCOUNTER — Other Ambulatory Visit: Payer: Self-pay | Admitting: Cardiovascular Disease

## 2021-02-04 LAB — BASIC METABOLIC PANEL
BUN/Creatinine Ratio: 10 (ref 10–24)
BUN: 8 mg/dL (ref 8–27)
CO2: 21 mmol/L (ref 20–29)
Calcium: 9.1 mg/dL (ref 8.6–10.2)
Chloride: 104 mmol/L (ref 96–106)
Creatinine, Ser: 0.84 mg/dL (ref 0.76–1.27)
Glucose: 103 mg/dL — ABNORMAL HIGH (ref 65–99)
Potassium: 4.5 mmol/L (ref 3.5–5.2)
Sodium: 141 mmol/L (ref 134–144)
eGFR: 91 mL/min/{1.73_m2} (ref >59)

## 2021-02-06 ENCOUNTER — Encounter: Payer: Medicare Other | Admitting: Gastroenterology

## 2021-02-16 ENCOUNTER — Ambulatory Visit (HOSPITAL_COMMUNITY): Payer: Medicare Other | Attending: Cardiology

## 2021-02-16 ENCOUNTER — Other Ambulatory Visit: Payer: Self-pay

## 2021-02-16 DIAGNOSIS — E785 Hyperlipidemia, unspecified: Secondary | ICD-10-CM

## 2021-02-16 DIAGNOSIS — Z951 Presence of aortocoronary bypass graft: Secondary | ICD-10-CM

## 2021-02-16 DIAGNOSIS — I2510 Atherosclerotic heart disease of native coronary artery without angina pectoris: Secondary | ICD-10-CM | POA: Diagnosis not present

## 2021-02-16 DIAGNOSIS — I10 Essential (primary) hypertension: Secondary | ICD-10-CM

## 2021-02-16 LAB — ECHOCARDIOGRAM COMPLETE
Area-P 1/2: 2.69 cm2
P 1/2 time: 473 msec
S' Lateral: 4.2 cm

## 2021-03-02 ENCOUNTER — Other Ambulatory Visit (HOSPITAL_COMMUNITY): Payer: Self-pay | Admitting: Cardiovascular Disease

## 2021-03-02 DIAGNOSIS — Z95828 Presence of other vascular implants and grafts: Secondary | ICD-10-CM

## 2021-03-25 IMAGING — XA Imaging study
2 series · 2 of 2 positions shown · non-contrast
Comparison: none

CLINICAL DATA: Lumbosacral spondylosis without myelopathy.
Bilateral thigh pain which initially began on the right, now with
right thigh numbness and more acute, severe left thigh pain.

[Series 1: ortho standard · 1 of 1 slices shown (1 of 2)]
[im 1/1]
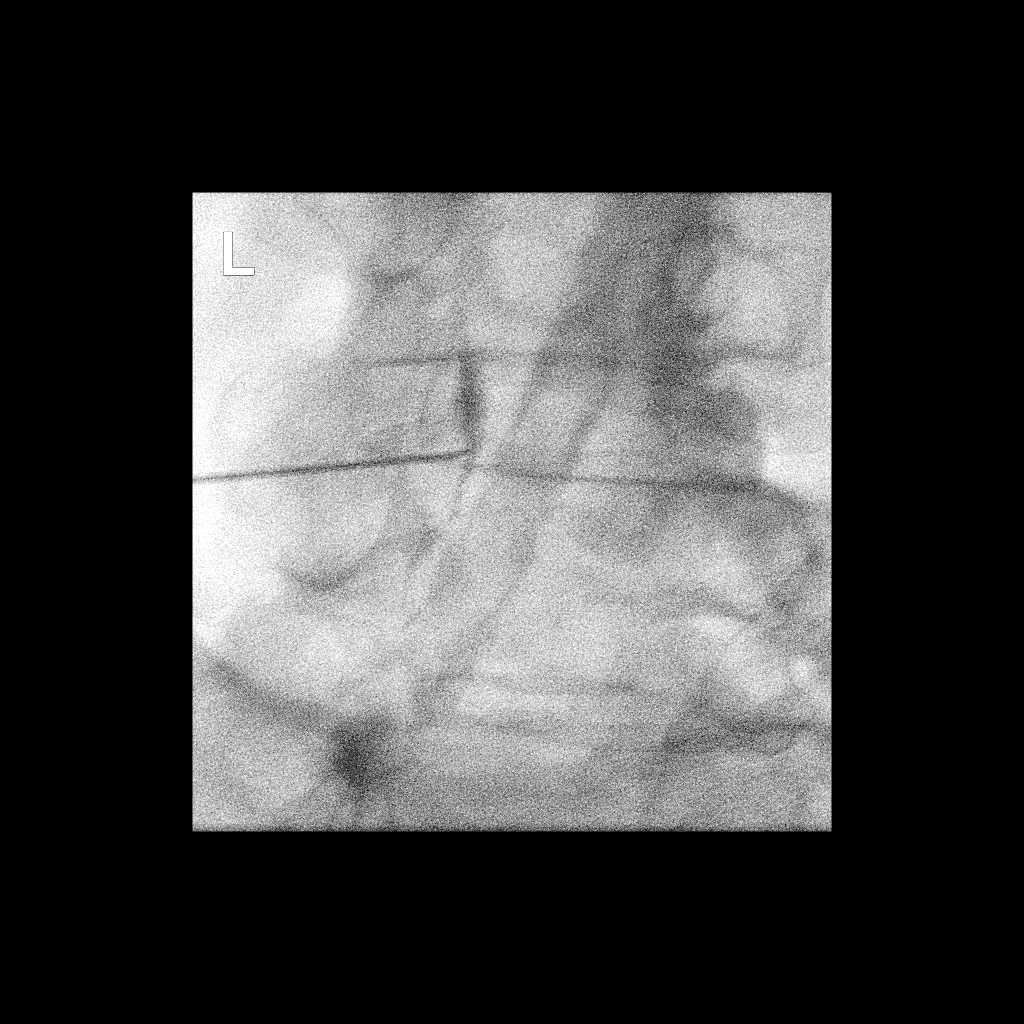

[Series 2: ortho standard · 1 of 1 slices shown (2 of 2)]
[im 1/1]
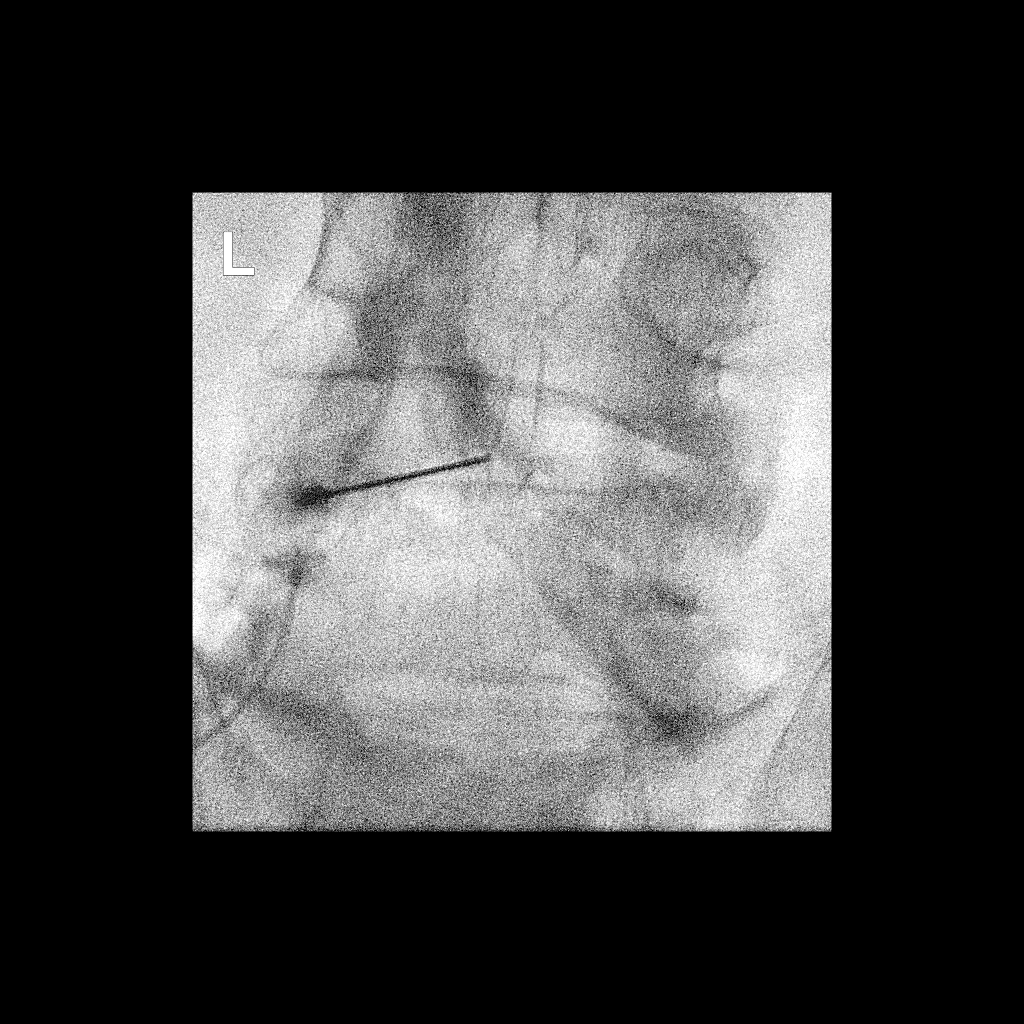

[2 of 2 positions shown; findings below may reference images not displayed]

FLUOROSCOPY TIME:  Fluoroscopy Time: 8 seconds

Radiation Exposure Index: 13.94 microGray*m^2

PROCEDURE:
The procedure, risks, benefits, and alternatives were explained to
the patient. Questions regarding the procedure were encouraged and
answered. The patient understands and consents to the procedure.

LUMBAR EPIDURAL INJECTION:

An interlaminar approach was performed on the left at L4-5. The
overlying skin was cleansed and anesthetized. A 3.5 inch 20 gauge
epidural needle was advanced using loss-of-resistance technique.

DIAGNOSTIC EPIDURAL INJECTION:

Injection of Isovue-M 200 shows a good epidural pattern with spread
above and below the level of needle placement, primarily on the
left. No vascular opacification is seen.

THERAPEUTIC EPIDURAL INJECTION:

120 mg of Depo-Medrol mixed with 3 mL of 1% lidocaine were
instilled. The procedure was well-tolerated, and the patient was
discharged thirty minutes following the injection in good condition.

COMPLICATIONS:
None
IMPRESSION: Technically successful lumbar interlaminar epidural injection on the
left at L4-5.

## 2021-04-15 ENCOUNTER — Encounter: Payer: Self-pay | Admitting: Cardiovascular Disease

## 2021-04-15 ENCOUNTER — Ambulatory Visit: Payer: Medicare Other | Admitting: Cardiovascular Disease

## 2021-04-15 ENCOUNTER — Other Ambulatory Visit: Payer: Self-pay

## 2021-04-15 VITALS — BP 150/62 | HR 54 | Ht 74.0 in | Wt 201.8 lb

## 2021-04-15 DIAGNOSIS — Z951 Presence of aortocoronary bypass graft: Secondary | ICD-10-CM

## 2021-04-15 DIAGNOSIS — I739 Peripheral vascular disease, unspecified: Secondary | ICD-10-CM

## 2021-04-15 DIAGNOSIS — R600 Localized edema: Secondary | ICD-10-CM

## 2021-04-15 DIAGNOSIS — E785 Hyperlipidemia, unspecified: Secondary | ICD-10-CM

## 2021-04-15 DIAGNOSIS — I10 Essential (primary) hypertension: Secondary | ICD-10-CM | POA: Diagnosis not present

## 2021-04-15 NOTE — Assessment & Plan Note (Signed)
History of CAD status post CABG x3 at [facility] with redo in 1998.  His symptoms prior to bypass or chest pain.  He has had no chest pain.  He has had stress test along the way but none since I seen him.

## 2021-04-15 NOTE — Assessment & Plan Note (Signed)
History of peripheral arterial disease status post angiography by myself 06/04/2019 revealing 99% calcified ostial right common iliac artery stenosis.  He had 50 to 60% diffuse calcified distal abdominal aortic stenosis and 60 to 70% calcified ostial/proximal left common iliac artery stenosis.  He underwent diamondback orbital rotational atherectomy, PTA and covered stenting of his right common iliac artery.  His claudication resolved.  His most recent ABIs performed 12/16/2020 revealed a right ABI of 0.98 and a left of 0.93.  He had normal velocities in his right iliac and mild elevation in his left iliac velocities.  He denies claudication.  Does complain of some numbness in his hip and legs when he sits down.  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

## 2021-04-15 NOTE — Assessment & Plan Note (Signed)
History of dyslipidemia on statin therapy with lipid profile performed by his PCP 12/30/2020 revealing total cholesterol 90, LDL 42 and HDL 20.

## 2021-04-15 NOTE — Assessment & Plan Note (Signed)
History of bilateral lower extreme edema somewhat improved on low-dose furosemide.  He does wear compression stockings.  Recent 2D echocardiogram performed 02/16/2021 revealed normal LV systolic function, grade 2 diastolic dysfunction and normal valvular function.  I suspect his edema is related to diastolic dysfunction.

## 2021-04-15 NOTE — Patient Instructions (Signed)
Medication Instructions:  Your physician recommends that you continue on your current medications as directed. Please refer to the Current Medication list given to you today.  *If you need a refill on your cardiac medications before your next appointment, please call your pharmacy*  Testing/Procedures: Your physician has requested that you have a lower extremity arterial duplex. This test is an ultrasound of the arteries in the legs. It looks at arterial blood flow in the legs. Allow one hour for Lower Arterial scans. There are no restrictions or special instructions  Your physician has requested that you have an abdominal aorta duplex. During this test, an ultrasound is used to evaluate the aorta. Allow 30 minutes for this exam. Do not eat after midnight the day before and avoid carbonated beverages  Your physician has requested that you have an ankle brachial index (ABI). During this test an ultrasound and blood pressure cuff are used to evaluate the arteries that supply the arms and legs with blood. Allow thirty minutes for this exam. There are no restrictions or special instructions.  These procedures are done at [facility]. 2nd Floor. To be done in January 2023.   Follow-Up: At [facility], you and your health needs are our priority.  As part of our continuing mission to provide you with exceptional heart care, we have created designated Provider Care Teams.  These Care Teams include your primary Cardiologist (physician) and Advanced Practice Providers (APPs -  Physician Assistants and Nurse Practitioners) who all work together to provide you with the care you need, when you need it.  We recommend signing up for the patient portal called "MyChart".  Sign up information is provided on this After Visit Summary.  MyChart is used to connect with patients for Virtual Visits (Telemedicine).  Patients are able to view lab/test results, encounter notes, upcoming appointments, etc.   Non-urgent messages can be sent to your provider as well.   To learn more about what you can do with MyChart, go to [email or web address].    Your next appointment:   6 month(s)  The format for your next appointment:   In Person  Provider:   Quay Burow, MD

## 2021-04-15 NOTE — Progress Notes (Signed)
04/15/2021 [name]   25-Apr-1946  [id number]  Primary Physician Kirk Ruths, MD Primary Cardiologist: Lorretta Harp MD FACP, [place], [place], [place]  HPI:  Gregory Shepherd is a 75 y.o.  mild to moderately overweight married Caucasian male father of 2, grandfather of 3 grandchildrenwho I last saw in the office2/08/2021.  He is accompanied by his wife Gregory Shepherd today. He was referred by Dr. Gardenia Phlegm for peripheral vascular valuation because of claudication. He is retired from being in [employer] 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 He has treated hypertension hyperlipidemia. His father did have a myocardial infarction. He had CABG x2 at [facility] in [place] by Dr. Eliberto Ivory. He did have a remote MI at age 2 as well. He had redo bypass surgery x3 at [facility] 8 years later. His major issue is with left lower extremity numbness and claudication. 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 He has high-frequency signals in both iliac arteries and monophasic waveforms below that on the right.  We proceeded withlower extremity angiography and intervention in 06/04/2019 revealing a 99% calcified ostial/proximal right common iliac artery stenosis and a 60 to 70% left common iliac artery stenosis. Performed diamondback orbital rotational atherectomy, PTA and stenting with an excellent result. His Dopplers normalized and his claudication resolved.  Since I saw him in the office 3 months ago he continues to deny claudication.  He denies chest pain or shortness of breath.  His major concern is fatigue.  We were evaluating him for lower extremity edema.  He has a low-dose diuretic.  It is possible that amlodipine may be contributory.  2D echo performed 02/16/2021 revealed normal LV systolic function  with grade 2 diastolic dysfunction and normal valvular function.   Current Meds  Medication Sig  . amLODipine (NORVASC) 5 MG tablet Take 5 mg by mouth daily.  Marland Kitchen aspirin 81 MG chewable tablet Chew by mouth daily.  Marland Kitchen atenolol (TENORMIN) 25 MG tablet Take 25 mg by mouth daily.  Marland Kitchen atorvastatin (LIPITOR) 80 MG tablet Take 80 mg by mouth daily.  . cholecalciferol (VITAMIN D3) 25 MCG (1000 UT) tablet Take 1,000 Units by mouth daily.  . clopidogrel (PLAVIX) 75 MG tablet TAKE 1 TABLET BY MOUTH ONCE DAILY WITH BREAKFAST  . finasteride (PROSCAR) 5 MG tablet Take 1 tablet (5 mg total) by mouth daily.  Marland Kitchen gabapentin (NEURONTIN) 300 MG capsule Take 1 capsule by mouth in the morning, at noon, in the evening, and at bedtime.  . isosorbide mononitrate (IMDUR) 60 MG 24 hr tablet Take 60 mg by mouth daily.  . lansoprazole (PREVACID) 30 MG capsule Take 1 capsule (30 mg total) by mouth 2 (two) times daily before a meal.  . Multiple Vitamin (MULTIVITAMIN) tablet Take 1 tablet by mouth daily.  . nitroGLYCERIN (NITROSTAT) 0.4 MG SL tablet as needed.  . traZODone (DESYREL) 50 MG tablet 0.5 tablets 2 (two) times daily.  . vitamin B-12 (CYANOCOBALAMIN) 1000 MCG tablet Take 1,000 mcg by mouth daily.     Allergies  Allergen Reactions  . Omeprazole     doesn't work well with plavix   . Other Palpitations    Certain steroids give patient heart palpitations     Social History   Socioeconomic History  . Marital status: Married  Spouse name: Not on file  . Number of children: Not on file  . Years of education: Not on file  . Highest education level: Not on file  Occupational History  . Not on file  Tobacco Use  . Smoking status: Current Every Day Smoker    Types: Cigarettes  . Smokeless tobacco: Never Used  . Tobacco comment: 8-9 cigarettes per day  Vaping Use  . Vaping Use: Never used  Substance and Sexual Activity  . Alcohol use: No  . Drug use: No  . Sexual activity: Not on file  Other Topics  Concern  . Not on file  Social History Narrative  . Not on file   Social Determinants of Health   Financial Resource Strain: Not on file  Food Insecurity: Not on file  Transportation Needs: Not on file  Physical Activity: Not on file  Stress: Not on file  Social Connections: Not on file  Intimate Partner Violence: Not on file     Review of Systems: General: negative for chills, fever, night sweats or weight changes.  Cardiovascular: negative for chest pain, dyspnea on exertion, edema, orthopnea, palpitations, paroxysmal nocturnal dyspnea or shortness of breath Dermatological: negative for rash Respiratory: negative for cough or wheezing Urologic: negative for hematuria Abdominal: negative for nausea, vomiting, diarrhea, bright red blood per rectum, melena, or hematemesis Neurologic: negative for visual changes, syncope, or dizziness All other systems reviewed and are otherwise negative except as noted above.    Blood pressure (!) 150/62, Shepherd (!) 54, height 6\' 2"  (1.88 m), weight 201 lb 12.8 oz (91.5 kg), SpO2 96 %.  General appearance: alert and no distress Neck: no adenopathy, no carotid bruit, no JVD, supple, symmetrical, trachea midline and thyroid not enlarged, symmetric, no tenderness/mass/nodules Lungs: clear to auscultation bilaterally Heart: regular rate and rhythm, S1, S2 normal, no murmur, click, rub or gallop Extremities: 1-2+ bilateral lower extremity edema. Pulses: 2+ and symmetric Skin: Skin color, texture, turgor normal. No rashes or lesions Neurologic: Alert and oriented X 3, normal strength and tone. Normal symmetric reflexes. Normal coordination and gait  EKG not performed today  ASSESSMENT AND PLAN:   Hx of CABG History of CAD status post CABG x3 at [place] with redo in 1998.  His symptoms prior to bypass or chest pain.  He has had no chest pain.  He has had stress test along the way but none since I seen him.  Essential  (primary) hypertension History of essential hypertension blood pressure measured today 150/62.  He is on amlodipine, and atenolol.  Peripheral arterial disease ([place]) History of peripheral arterial disease status post angiography by myself 06/04/2019 revealing 99% calcified ostial right common iliac artery stenosis.  He had 50 to 60% diffuse calcified distal abdominal aortic stenosis and 60 to 70% calcified ostial/proximal left common iliac artery stenosis.  He underwent diamondback orbital rotational atherectomy, PTA and covered stenting of his right common iliac artery.  His claudication resolved.  His most recent ABIs performed 12/16/2020 revealed a right ABI of 0.98 and a left of 0.93.  He had normal velocities in his right iliac and mild elevation in his left iliac velocities.  He denies claudication.  Does complain of some numbness in his hip and legs when he sits down.  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  Dyslipidemia, goal LDL below 70 History of dyslipidemia on statin therapy with lipid profile performed by his PCP 12/30/2020 revealing total  cholesterol 90, LDL 42 and HDL 20.  Bilateral lower extremity edema History of bilateral lower extreme edema somewhat improved on low-dose furosemide.  He does wear compression stockings.  Recent 2D echocardiogram performed 02/16/2021 revealed normal LV systolic function, grade 2 diastolic dysfunction and normal valvular function.  I suspect his edema is related to diastolic dysfunction.      Lorretta Harp MD FACP,FACC,FAHA, [place] 04/15/2021 12:00 PM

## 2021-04-15 NOTE — Assessment & Plan Note (Signed)
History of essential hypertension blood pressure measured today 150/62.  He is on amlodipine, and atenolol.

## 2021-06-19 ENCOUNTER — Observation Stay
Admission: EM | Admit: 2021-06-19 | Discharge: 2021-06-20 | Disposition: A | Discharge status: Home / Self Care | Payer: Medicare Other | Attending: Internal Medicine | Admitting: Internal Medicine

## 2021-06-19 ENCOUNTER — Encounter: Payer: Self-pay | Admitting: Physician Assistant

## 2021-06-19 ENCOUNTER — Emergency Department: Payer: Medicare Other

## 2021-06-19 ENCOUNTER — Other Ambulatory Visit: Payer: Self-pay

## 2021-06-19 DIAGNOSIS — I5033 Acute on chronic diastolic (congestive) heart failure: Secondary | ICD-10-CM | POA: Diagnosis not present

## 2021-06-19 DIAGNOSIS — R079 Chest pain, unspecified: Secondary | ICD-10-CM | POA: Diagnosis not present

## 2021-06-19 DIAGNOSIS — Z7982 Long term (current) use of aspirin: Secondary | ICD-10-CM | POA: Diagnosis not present

## 2021-06-19 DIAGNOSIS — E119 Type 2 diabetes mellitus without complications: Secondary | ICD-10-CM | POA: Diagnosis not present

## 2021-06-19 DIAGNOSIS — R0602 Shortness of breath: Principal | ICD-10-CM | POA: Diagnosis present

## 2021-06-19 DIAGNOSIS — F17210 Nicotine dependence, cigarettes, uncomplicated: Secondary | ICD-10-CM | POA: Diagnosis not present

## 2021-06-19 DIAGNOSIS — Z20822 Contact with and (suspected) exposure to covid-19: Secondary | ICD-10-CM | POA: Insufficient documentation

## 2021-06-19 DIAGNOSIS — I2510 Atherosclerotic heart disease of native coronary artery without angina pectoris: Secondary | ICD-10-CM | POA: Diagnosis not present

## 2021-06-19 DIAGNOSIS — I509 Heart failure, unspecified: Secondary | ICD-10-CM

## 2021-06-19 DIAGNOSIS — I517 Cardiomegaly: Secondary | ICD-10-CM | POA: Diagnosis not present

## 2021-06-19 DIAGNOSIS — I110 Hypertensive heart disease with heart failure: Secondary | ICD-10-CM | POA: Insufficient documentation

## 2021-06-19 DIAGNOSIS — E785 Hyperlipidemia, unspecified: Secondary | ICD-10-CM

## 2021-06-19 DIAGNOSIS — Z79899 Other long term (current) drug therapy: Secondary | ICD-10-CM | POA: Insufficient documentation

## 2021-06-19 DIAGNOSIS — I10 Essential (primary) hypertension: Secondary | ICD-10-CM | POA: Diagnosis present

## 2021-06-19 DIAGNOSIS — Z951 Presence of aortocoronary bypass graft: Secondary | ICD-10-CM

## 2021-06-19 DIAGNOSIS — N138 Other obstructive and reflux uropathy: Secondary | ICD-10-CM | POA: Diagnosis present

## 2021-06-19 DIAGNOSIS — Z7902 Long term (current) use of antithrombotics/antiplatelets: Secondary | ICD-10-CM | POA: Insufficient documentation

## 2021-06-19 DIAGNOSIS — R0789 Other chest pain: Secondary | ICD-10-CM | POA: Diagnosis present

## 2021-06-19 DIAGNOSIS — K219 Gastro-esophageal reflux disease without esophagitis: Secondary | ICD-10-CM

## 2021-06-19 LAB — CBC
HCT: 37 % — ABNORMAL LOW (ref 39.0–52.0)
Hemoglobin: 12.1 g/dL — ABNORMAL LOW (ref 13.0–17.0)
MCH: 27.2 pg (ref 26.0–34.0)
MCHC: 32.7 g/dL (ref 30.0–36.0)
MCV: 83.1 fL (ref 80.0–100.0)
Platelets: 196 10*3/uL (ref 150–400)
RBC: 4.45 MIL/uL (ref 4.22–5.81)
RDW: 16.2 % — ABNORMAL HIGH (ref 11.5–15.5)
WBC: 8.7 10*3/uL (ref 4.0–10.5)
nRBC: 0 % (ref 0.0–0.2)

## 2021-06-19 LAB — BASIC METABOLIC PANEL
Anion gap: 9 (ref 5–15)
BUN: 19 mg/dL (ref 8–23)
CO2: 23 mmol/L (ref 22–32)
Calcium: 8.8 mg/dL — ABNORMAL LOW (ref 8.9–10.3)
Chloride: 106 mmol/L (ref 98–111)
Creatinine, Ser: 0.82 mg/dL (ref 0.61–1.24)
GFR, Estimated: >60 mL/min (ref >60)
Glucose, Bld: 138 mg/dL — ABNORMAL HIGH (ref 70–99)
Potassium: 3.8 mmol/L (ref 3.5–5.1)
Sodium: 138 mmol/L (ref 135–145)

## 2021-06-19 LAB — RESP PANEL BY RT-PCR (FLU A&B, COVID) ARPGX2
Influenza A by PCR: NEGATIVE
Influenza B by PCR: NEGATIVE
SARS Coronavirus 2 by RT PCR: NEGATIVE

## 2021-06-19 LAB — BRAIN NATRIURETIC PEPTIDE: B Natriuretic Peptide: 430.2 pg/mL — ABNORMAL HIGH (ref 0.0–100.0)

## 2021-06-19 LAB — TSH: TSH: 2.699 u[IU]/mL (ref 0.350–4.500)

## 2021-06-19 LAB — TROPONIN I (HIGH SENSITIVITY)
Troponin I (High Sensitivity): 22 ng/L — ABNORMAL HIGH (ref <18)
Troponin I (High Sensitivity): 36 ng/L — ABNORMAL HIGH (ref <18)

## 2021-06-19 MED ORDER — ATENOLOL 25 MG PO TABS
25.0000 mg | ORAL_TABLET | Freq: Every day | ORAL | Status: DC
  Filled 2021-06-19: qty 1

## 2021-06-19 MED ORDER — FINASTERIDE 5 MG PO TABS
5.0000 mg | ORAL_TABLET | Freq: Every day | ORAL | Status: DC
  Administered 2021-06-19: 5 mg via ORAL
  Filled 2021-06-19 (×2): qty 1

## 2021-06-19 MED ORDER — TRAZODONE HCL 50 MG PO TABS
25.0000 mg | ORAL_TABLET | Freq: Two times a day (BID) | ORAL | Status: DC
  Administered 2021-06-19: 23:00:00 25 mg via ORAL
  Filled 2021-06-19: qty 1

## 2021-06-19 MED ORDER — VITAMIN B-12 1000 MCG PO TABS: 1000.0000 ug | ORAL_TABLET | Freq: Every day | ORAL | Status: DC

## 2021-06-19 MED ORDER — GABAPENTIN 300 MG PO CAPS
300.0000 mg | ORAL_CAPSULE | Freq: Three times a day (TID) | ORAL | Status: DC
  Administered 2021-06-19: 300 mg via ORAL
  Filled 2021-06-19 (×2): qty 1

## 2021-06-19 MED ORDER — ASPIRIN 81 MG PO CHEW: 81.0000 mg | CHEWABLE_TABLET | Freq: Every day | ORAL | Status: DC

## 2021-06-19 MED ORDER — AMLODIPINE BESYLATE 5 MG PO TABS
5.0000 mg | ORAL_TABLET | Freq: Every day | ORAL | Status: DC
  Administered 2021-06-19: 5 mg via ORAL
  Filled 2021-06-19 (×2): qty 1

## 2021-06-19 MED ORDER — ACETAMINOPHEN 325 MG PO TABS
650.0000 mg | ORAL_TABLET | Freq: Four times a day (QID) | ORAL | Status: DC | PRN
  Administered 2021-06-20: 650 mg via ORAL
  Filled 2021-06-19: qty 2

## 2021-06-19 MED ORDER — ENOXAPARIN SODIUM 40 MG/0.4ML IJ SOSY: 40.0000 mg | PREFILLED_SYRINGE | INTRAMUSCULAR | Status: DC

## 2021-06-19 MED ORDER — ONDANSETRON HCL 4 MG/2ML IJ SOLN: 4.0000 mg | Freq: Four times a day (QID) | INTRAMUSCULAR | Status: DC | PRN

## 2021-06-19 MED ORDER — ASCORBIC ACID 500 MG PO TABS: 250.0000 mg | ORAL_TABLET | Freq: Every day | ORAL | Status: DC

## 2021-06-19 MED ORDER — CLOPIDOGREL BISULFATE 75 MG PO TABS
75.0000 mg | ORAL_TABLET | Freq: Every day | ORAL | Status: DC
  Filled 2021-06-19: qty 1

## 2021-06-19 MED ORDER — FUROSEMIDE 10 MG/ML IJ SOLN
40.0000 mg | Freq: Two times a day (BID) | INTRAMUSCULAR | Status: DC
  Administered 2021-06-19 – 2021-06-20 (×2): 40 mg via INTRAVENOUS
  Filled 2021-06-19 (×2): qty 4

## 2021-06-19 MED ORDER — ONDANSETRON HCL 4 MG PO TABS: 4.0000 mg | ORAL_TABLET | Freq: Four times a day (QID) | ORAL | Status: DC | PRN

## 2021-06-19 MED ORDER — ACETAMINOPHEN 650 MG RE SUPP: 650.0000 mg | Freq: Four times a day (QID) | RECTAL | Status: DC | PRN

## 2021-06-19 MED ORDER — ISOSORBIDE MONONITRATE ER 30 MG PO TB24
60.0000 mg | ORAL_TABLET | Freq: Every day | ORAL | Status: DC
  Administered 2021-06-19: 60 mg via ORAL
  Filled 2021-06-19: qty 2

## 2021-06-19 MED ORDER — FUROSEMIDE 10 MG/ML IJ SOLN
40.0000 mg | Freq: Once | INTRAMUSCULAR | Status: AC
  Administered 2021-06-19: 40 mg via INTRAVENOUS
  Filled 2021-06-19: qty 4

## 2021-06-19 MED ORDER — ADULT MULTIVITAMIN W/MINERALS CH: 1.0000 | ORAL_TABLET | Freq: Every day | ORAL | Status: DC

## 2021-06-19 MED ORDER — ATORVASTATIN CALCIUM 20 MG PO TABS
80.0000 mg | ORAL_TABLET | Freq: Every day | ORAL | Status: DC
  Administered 2021-06-19: 23:00:00 80 mg via ORAL
  Filled 2021-06-19: qty 4

## 2021-06-19 MED ORDER — PANTOPRAZOLE SODIUM 40 MG PO TBEC
40.0000 mg | DELAYED_RELEASE_TABLET | Freq: Every day | ORAL | Status: DC
  Administered 2021-06-19: 40 mg via ORAL
  Filled 2021-06-19 (×2): qty 1

## 2021-06-19 NOTE — ED Triage Notes (Signed)
Pt states he started having CP and SHOB "on and off for a while" but got worse last night and won't go away- pt states pain is in the middle of his chest and is sharp and burning- pt has had 2 open heart surgeries in the past

## 2021-06-19 NOTE — Plan of Care (Signed)
  Problem: Education: Goal: Knowledge of General Education information will improve Description: Including pain rating scale, medication(s)/side effects and non-pharmacologic comfort measures Outcome: Progressing   Problem: Nutrition: Goal: Adequate nutrition will be maintained Outcome: Progressing   

## 2021-06-19 NOTE — ED Notes (Signed)
Awaiting room on floor to be cleaned. Patient accepted by floor RN.

## 2021-06-19 NOTE — ED Notes (Signed)
Pharmacy called to reschedule pts amlodipine, pantoprazole, and gabapentin.

## 2021-06-19 NOTE — ED Notes (Signed)
Pt ambulatory to restroom

## 2021-06-19 NOTE — H&P (Signed)
Find History and Physical   Gregory Shepherd UYQ:[id number] DOB: [birth year] DOA: 06/19/2021  PCP: Kirk Ruths, MD  Outpatient Specialists: Dr. Alvester Chou, cardiologist  Patient coming from: home   I have personally briefly reviewed patient's old medical records in [place].  Chief Concern: shortness of breath  HPI: Gregory Shepherd is a 75 y.o. male with medical history significant for grade 2 diastolic dysfunction, hypertension, history of CAD status post CABG, hyperlipidemia, BPH, neuropathy, current tobacco use, presents to the emergency department for chief concerns of shortness of breath.  He endorses that he has shortness of breath that is worse with laying flat and on going for two weeks.  He states that in the last couple weeks he has also been having chest pain and burning pain.  He reports that over the last 2 weeks the pain went away with nitroglycerin.  However, he states that the chest pain/burning followed by sharp pain that lasted all night and was unrelieved with nitroglycerin.  This prompted him to present to the ED. At bedside, he denies chest pain at this time. He denies fever, nausea, vomiting, abdominal pain, dysuria, cough, syncope, lost of consciousness.  He drinks about three-four 17 oz bottles of water and/or tea per day. Three 8 oz cups of coffee every morning. 0.5 bottles of soda per day. He denies soup as it is summer.  Social history: He lives with Barnetta Chapel, spouse. He smokes 0.25 ppd, started at age 32. He denies etoh, recreational drug use. 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   Vaccination history: He vaccinated for covid 19, pfizer with 3 doses.  ROS: Constitutional: no weight change, no fever ENT/Mouth: no sore throat, no rhinorrhea Eyes: no eye pain, no vision changes Cardiovascular: no chest pain, no dyspnea,  no edema, no palpitations Respiratory: no cough, no sputum, no  wheezing Gastrointestinal: no nausea, no vomiting, no diarrhea, no constipation Genitourinary: no urinary incontinence, no dysuria, no hematuria Musculoskeletal: no arthralgias, no myalgias Skin: no skin lesions, no pruritus, Neuro: + weakness, no loss of consciousness, no syncope Psych: no anxiety, no depression, no decrease appetite Heme/Lymph: no bruising, no bleeding  ED Course: Discussed with emergency medicine provider, patient requiring hospitalization for suspected heart failure exacerbation.  Initial vitals in the emergency department was remarkable for temperature of 98.5, respiration rate of 18, heart rate of 56, blood pressure 140/74, SPO2 of 98% on room air.  Labs in the emergency department was remarkable for nonfasting blood glucose 138, BNP 430, troponin 22, increased to 36, WBC 8.7, hemoglobin 12.1, platelets 196, COVID/influenza A/influenza B PCR were negative.  EDP ordered 1 dose of Lasix 40 mg IV.  Assessment/Plan  Principal Problem:   Shortness of breath Active Problems:   Hx of CABG   BPH with obstruction/lower urinary tract symptoms   Essential (primary) hypertension   Hyperlipemia   Chronic GERD   # Shortness of breath success secondary to heart failure exacerbation - Status post Lasix 40 mg IV per EDP - Lasix 40 mg IV twice daily ordered, for 2 days - Counseled patient on not drinking soda and decreasing to 1-2 cups of coffee per day instead of the current 3 - I encourage patient to drink approximately 1.5 L/day of total fluid - Strict I's and O's - No echo ordered as patient recently had an echo in March 2022  # Possible EKG atrial fibrillation-I have ordered a repeat EKG at 5  AM on 06/20/2021 - Not present on previous to EKG - If positive a.m. team to consider consulting cardiology and/or discussion with patient regarding adding anticoagulation in setting of Plavix and aspirin use  # Elevated troponin-I suspect this secondary to demand ischemia in  setting of shortness of breath secondary to heart failure exacerbation -We will treat as above  # History of CAD s/p CABG-denies chest pain at this time, I have low clinical suspicion for ACS - Resumed home Plavix 75 mg daily, aspirin 81 mg daily - Antihypertensives, high intensity atorvastatin 80 mg nightly resumed  # Current tobacco use-patient is not ready to quit # Hypertension-atenolol 25 mg daily, amlodipine 5 mg daily, isosorbide mononitrate 60 mg daily resumed # Hyperlipidemia-atorvastatin 80 mg nightly resumed # Insomnia-trazodone 25 mg twice daily resumed # GERD-PPI # BPH-finasteride 5 mg daily resumed  Chart reviewed.   Echo 02/16/21: Read as estimated ejection fraction of 50 to 12%, grade 2 diastolic dysfunction.  Elevated left atrial pressure.  E/A is 15.  DVT prophylaxis: Enoxaparin 40 mg subcutaneous Code Status: Full code Diet: Heart healthy Family Communication: No Disposition Plan: Pending clinical course Consults called: None at this time Admission status: MedSurg, observation, telemetry for 24 hours ordered  Past Medical History:  Diagnosis Date   Carotid artery narrowing    History of blood clots    History of colon polyps    Hyperlipidemia    Hypertension    MI (myocardial infarction) [facility])    Past Surgical History:  Procedure Laterality Date   ABDOMINAL AORTOGRAM W/LOWER EXTREMITY N/A 06/04/2019   Procedure: ABDOMINAL AORTOGRAM W/LOWER EXTREMITY;  Surgeon: Lorretta Harp, MD;  Location: [place] [facility];  Service: Cardiovascular;  Laterality: N/A;   CARDIAC SURGERY     x2    COLONOSCOPY     Age 56. [facility]   CORONARY ARTERY BYPASS GRAFT  1990   x 2 at [facility], redo x 3 @ [place] approx 1998   PERIPHERAL VASCULAR ATHERECTOMY Right 06/04/2019   Procedure: PERIPHERAL VASCULAR ATHERECTOMY;  Surgeon: Lorretta Harp, MD;  Location: [place] [facility];  Service: Cardiovascular;  Laterality: Right;  common iliac   PERIPHERAL  VASCULAR INTERVENTION Right 06/04/2019   Procedure: PERIPHERAL VASCULAR INTERVENTION;  Surgeon: Lorretta Harp, MD;  Location: [place] [facility];  Service: Cardiovascular;  Laterality: Right;  common iliac   Social History:  reports that he has been smoking cigarettes. He has never used smokeless tobacco. He reports that he does not drink alcohol and does not use drugs.  Allergies  Allergen Reactions   Omeprazole     doesn't work well with plavix    Other Palpitations    Certain steroids give patient heart palpitations  Allergy to green peppers   Family History  Problem Relation Age of Onset   Heart disease Mother    Heart disease Father    Diabetes Sister    Prostate cancer Neg Hx    Bladder Cancer Neg Hx    Kidney cancer Neg Hx    Colon cancer Neg Hx    Esophageal cancer Neg Hx    Family history: Family history reviewed and not pertinent  Prior to Admission medications   Medication Sig Start Date End Date Taking? Authorizing Provider  amLODipine (NORVASC) 5 MG tablet Take 5 mg by mouth daily. 12/16/20   [provider]  aspirin 81 MG chewable tablet Chew by mouth daily.    [provider]  atenolol (TENORMIN)  25 MG tablet Take 25 mg by mouth daily.    [provider]  atorvastatin (LIPITOR) 80 MG tablet Take 80 mg by mouth daily.    [provider]  cholecalciferol (VITAMIN D3) 25 MCG (1000 UT) tablet Take 1,000 Units by mouth daily.    [provider]  clopidogrel (PLAVIX) 75 MG tablet TAKE 1 TABLET BY MOUTH ONCE DAILY WITH BREAKFAST 12/30/20   Lorretta Harp, MD  finasteride (PROSCAR) 5 MG tablet Take 1 tablet (5 mg total) by mouth daily. 12/29/18   Stoioff, Ronda Fairly, MD  furosemide (LASIX) 20 MG tablet Take 1 tablet (20 mg total) by mouth daily. 01/14/21 04/14/21  Lorretta Harp, MD  gabapentin (NEURONTIN) 300 MG capsule Take 1 capsule by mouth in the morning, at noon, in the evening, and at bedtime. 12/16/20   [provider]  isosorbide mononitrate (IMDUR) 60 MG 24 hr tablet Take 60 mg by mouth daily.    [provider]  lansoprazole (PREVACID) 30 MG capsule Take 1 capsule (30 mg total) by mouth 2 (two) times daily before a meal. 12/18/20   Thornton Park, MD  Multiple Vitamin (MULTIVITAMIN) tablet Take 1 tablet by mouth daily.    [provider]  nitroGLYCERIN (NITROSTAT) 0.4 MG SL tablet as needed. 12/01/20   [provider]  potassium chloride (KLOR-CON) 10 MEQ tablet Take 1 tablet (10 mEq total) by mouth daily. 01/14/21 04/14/21  Lorretta Harp, MD  traZODone (DESYREL) 50 MG tablet 0.5 tablets 2 (two) times daily. 12/01/20   [provider]  vitamin B-12 (CYANOCOBALAMIN) 1000 MCG tablet Take 1,000 mcg by mouth daily.    [provider]   Physical Exam: Vitals:   06/19/21 0927 06/19/21 1055 06/19/21 1130 06/19/21 1710  BP: 140/74 (!) 142/84 136/66 (!) 150/70  Pulse: (!) 56 61 78 66  Resp: 18 17 (!) 22 19  Temp: 98.5 F (36.9 C)   98.1 F (36.7 C)  TempSrc: Oral   Oral  SpO2: 98% 95% 96% 97%  Weight:      Height:       Constitutional: appears age-appropriate, NAD, calm, comfortable Eyes: PERRL, lids and conjunctivae normal ENMT: Mucous membranes are moist. Posterior pharynx clear of any exudate or lesions. Age-appropriate dentition. Hearing appropriate Neck: normal, supple, no masses, no thyromegaly Respiratory: clear to auscultation bilaterally, no wheezing, no crackles. Normal respiratory effort. No accessory muscle use.  Cardiovascular: Regular rate and rhythm, no murmurs / rubs / gallops. No extremity edema. 2+ pedal pulses. No carotid bruits.  Abdomen: no tenderness, no masses palpated, no hepatosplenomegaly. Bowel sounds positive.  Musculoskeletal: no clubbing / cyanosis. No joint deformity upper and lower extremities. Good ROM, no contractures, no atrophy. Normal muscle tone.  Skin: no rashes, lesions, ulcers. No induration Neurologic:  Sensation intact. Strength 5/5 in all 4.  Psychiatric: Normal judgment and insight. Alert and oriented x 3. Normal mood.   EKG: independently reviewed, showing rate of 74, right bundle branch block, PVCs, qtc 472  Chest x-ray on Admission: I personally reviewed and I agree with radiologist reading as below.  DG Chest 2 View  Result Date: 06/19/2021 CLINICAL DATA:  Chest pain EXAM: CHEST - 2 VIEW COMPARISON:  None. FINDINGS: Post CABG changes. Mild cardiomegaly. Diffusely prominent interstitial markings bilaterally. Streaky bibasilar opacities. No pleural effusion or pneumothorax. IMPRESSION: 1. Mild cardiomegaly with diffusely prominent interstitial markings. This may reflect chronic interstitial lung changes or mild edema. 2. Streaky bibasilar opacities, likely atelectasis. Electronically  Signed   By: Davina Poke D.O.   On: 06/19/2021 10:09    Labs on Admission: I have personally reviewed following labs  CBC: Recent Labs  Lab 06/19/21 0931  WBC 8.7  HGB 12.1*  HCT 37.0*  MCV 83.1  PLT 941   Basic Metabolic Panel: Recent Labs  Lab 06/19/21 0931  NA 138  K 3.8  CL 106  CO2 23  GLUCOSE 138*  BUN 19  CREATININE 0.82  CALCIUM 8.8*   GFR: Estimated Creatinine Clearance: 88 mL/min (by C-G formula based on SCr of 0.82 mg/dL).  Dr. Tobie Poet [facility]  If 7PM-7AM, please contact overnight-coverage provider If 7AM-7PM, please contact day coverage provider [email or web address]  06/19/2021, 6:48 PM

## 2021-06-19 NOTE — ED Notes (Addendum)
See triage note, pt to ED for centralized cp radiating to left with burning sensation and shob for the past 3 days that worsened last night. Expiratory wheezing noted, states worsens while laying down. Pt able to speak in complete sentences, ambulatory. NAD noted. Hx MI and open heart surgery x2 Alert and oriented Stretcher locked in lowest position, call bell in reach.  Denies needs.

## 2021-06-19 NOTE — ED Notes (Signed)
Lab contacted to add on TSH

## 2021-06-19 NOTE — ED Notes (Signed)
Pt declining to be hooked up to monitors at this time, walking around room with steady gait. Repeatedly using the restroom.

## 2021-06-19 NOTE — ED Notes (Signed)
Pt provided pillow. 

## 2021-06-19 NOTE — Progress Notes (Signed)
When pt arrived he was adamant about taking his own home medications. Contacted Physician to inform her. Informed patient on policy about home meds and advised him to send them home with his wife. He agreed to take prescribed medications dispensed by our pharmacy.

## 2021-06-19 NOTE — ED Provider Notes (Signed)
[facility] Emergency Department Provider Note  ____________________________________________   None    (approximate)  I have reviewed the triage vital signs and the nursing notes.   HISTORY  Chief Complaint Chest Pain  HPI Gregory Shepherd is a 75 y.o. male with below medical history including hypertension, MI, CABG, presents to the ED for several days of central chest pain.  Patient reports he has had chest pain for a while, but notes that over the last 2 to 3 days he has had several episodes of central chest pain that his cough is worse at night.  He reports the pain which he describes as burning and sharp in nature, as if he swallowed something hot," tends to resolve with nitro and or rest.  He describes also increased shortness of breath which was worse with lying flat.  His breathing seemed improved when he was sits up or stands.  He denies any exertional component to his chest pain.  Patient denies any significant cough, congestion, fever chills, or sweats.  He also denies any nausea, vomiting, or syncope.  He describes some ongoing leg swelling as well as some intermittent dizziness as well.  He presents to the ED for evaluation of his symptoms.  He denies any significant pain at this time.     Past Medical History:  Diagnosis Date   Carotid artery narrowing    History of blood clots    History of colon polyps    Hyperlipidemia    Hypertension    MI (myocardial infarction) [facility])     Patient Active Problem List   Diagnosis Date Noted   Bilateral lower extremity edema 01/14/2021   Claudication in peripheral vascular disease ([place]) 06/04/2019   Dyslipidemia, goal LDL below 70 02/20/2019   Peripheral arterial disease ([place]) 02/03/2019   Diabetes (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 [facility]   CORONARY ARTERY BYPASS GRAFT  1990   x 2 at [facility], redo x 3 @ [place] approx 1998   PERIPHERAL VASCULAR ATHERECTOMY Right 06/04/2019   Procedure: PERIPHERAL VASCULAR ATHERECTOMY;  Surgeon: Lorretta Harp, MD;  Location: [place] [facility];  Service: Cardiovascular;  Laterality: Right;  common iliac   PERIPHERAL VASCULAR INTERVENTION Right 06/04/2019   Procedure: PERIPHERAL VASCULAR INTERVENTION;  Surgeon: Lorretta Harp, MD;  Location: [place] [facility];  Service: Cardiovascular;  Laterality: Right;  common iliac    Prior to Admission medications   Medication Sig Start Date End Date Taking? Authorizing Provider  amLODipine (NORVASC) 5 MG tablet Take 5 mg by mouth daily. 12/16/20   [provider]  aspirin 81 MG chewable tablet Chew by mouth daily.    [provider]  atenolol (  TENORMIN) 25 MG tablet Take 25 mg by mouth daily.    [provider]  atorvastatin (LIPITOR) 80 MG tablet Take 80 mg by mouth daily.    [provider]  cholecalciferol (VITAMIN D3) 25 MCG (1000 UT) tablet Take 1,000 Units by mouth daily.    [provider]  clopidogrel (PLAVIX) 75 MG tablet TAKE 1 TABLET BY MOUTH ONCE DAILY WITH BREAKFAST 12/30/20   Lorretta Harp, MD  finasteride (PROSCAR) 5 MG tablet Take 1 tablet (5 mg total) by mouth daily. 12/29/18    Stoioff, Ronda Fairly, MD  furosemide (LASIX) 20 MG tablet Take 1 tablet (20 mg total) by mouth daily. 01/14/21 04/14/21  Lorretta Harp, MD  gabapentin (NEURONTIN) 300 MG capsule Take 1 capsule by mouth in the morning, at noon, in the evening, and at bedtime. 12/16/20   [provider]  isosorbide mononitrate (IMDUR) 60 MG 24 hr tablet Take 60 mg by mouth daily.    [provider]  lansoprazole (PREVACID) 30 MG capsule Take 1 capsule (30 mg total) by mouth 2 (two) times daily before a meal. 12/18/20   Thornton Park, MD  Multiple Vitamin (MULTIVITAMIN) tablet Take 1 tablet by mouth daily.    [provider]  nitroGLYCERIN (NITROSTAT) 0.4 MG SL tablet as needed. 12/01/20   [provider]  potassium chloride (KLOR-CON) 10 MEQ tablet Take 1 tablet (10 mEq total) by mouth daily. 01/14/21 04/14/21  Lorretta Harp, MD  traZODone (DESYREL) 50 MG tablet 0.5 tablets 2 (two) times daily. 12/01/20   [provider]  vitamin B-12 (CYANOCOBALAMIN) 1000 MCG tablet Take 1,000 mcg by mouth daily.    [provider]    Allergies Omeprazole and Other  Family History  Problem Relation Age of Onset   Heart disease Mother    Heart disease Father    Diabetes Sister    Prostate cancer Neg Hx    Bladder Cancer Neg Hx    Kidney cancer Neg Hx    Colon cancer Neg Hx    Esophageal cancer Neg Hx     Social History Social History   Tobacco Use   Smoking status: Every Day    Types: Cigarettes   Smokeless tobacco: Never   Tobacco comments:    8-9 cigarettes per day  Vaping Use   Vaping Use: Never used  Substance Use Topics   Alcohol use: No   Drug use: No    Review of Systems Constitutional: No fever/chills Eyes: No visual changes. ENT: No sore throat. Cardiovascular: Reports chest pain. Respiratory: Reports shortness of breath. Gastrointestinal: No abdominal pain.  No nausea, no vomiting.  No diarrhea.  No constipation. Genitourinary: Negative  for dysuria. Musculoskeletal: Negative for back pain. Skin: Negative for rash. Neurological: Negative for headaches, focal weakness or numbness. ____________________________________________   PHYSICAL EXAM:  VITAL SIGNS: ED Triage Vitals  Enc Vitals Group     BP 06/19/21 0927 140/74     Pulse Rate 06/19/21 0927 (!) 56     Resp 06/19/21 0927 18     Temp 06/19/21 0927 98.5 F (36.9 C)     Temp Source 06/19/21 0927 Oral     SpO2 06/19/21 0927 98 %     Weight 06/19/21 0925 195 lb (88.5 kg)     Height 06/19/21 0925 6\' 1"  (1.854 m)     Head Circumference --      Peak Flow --      Pain Score 06/19/21 0925 8  Pain Loc --      Pain Edu? --      Excl. in [place]? --     Constitutional: Alert and oriented. Well appearing and in no acute distress. Eyes: Conjunctivae are normal. PERRL. EOMI. Head: Atraumatic. Nose: No congestion/rhinnorhea. Mouth/Throat: Mucous membranes are moist.  Oropharynx non-erythematous. Neck: No stridor.   Cardiovascular: Normal rate, regular rhythm. Grossly normal heart sounds.  Good peripheral circulation. Respiratory: Normal respiratory effort.  No retractions. Lungs with mild wheezing to the right >left Gastrointestinal: Soft and nontender. No distention. No abdominal bruits. No CVA tenderness. Musculoskeletal: No lower extremity tenderness nor edema.  No joint effusions. Neurologic:  Normal speech and language. No gross focal neurologic deficits are appreciated. No gait instability. Skin:  Skin is warm, dry and intact. No rash noted. Psychiatric: Mood and affect are normal. Speech and behavior are normal.  ____________________________________________   LABS (all labs ordered are listed, but only abnormal results are displayed)  Labs Reviewed  BASIC METABOLIC PANEL - Abnormal; Notable for the following components:      Result Value   Glucose, Bld 138 (*)    Calcium 8.8 (*)    All other components within normal limits  CBC - Abnormal; Notable for the  following components:   Hemoglobin 12.1 (*)    HCT 37.0 (*)    RDW 16.2 (*)    All other components within normal limits  BRAIN NATRIURETIC PEPTIDE - Abnormal; Notable for the following components:   B Natriuretic Peptide 430.2 (*)    All other components within normal limits  TROPONIN I (HIGH SENSITIVITY) - Abnormal; Notable for the following components:   Troponin I (High Sensitivity) 22 (*)    All other components within normal limits  TROPONIN I (HIGH SENSITIVITY) - Abnormal; Notable for the following components:   Troponin I (High Sensitivity) 36 (*)    All other components within normal limits  RESP PANEL BY RT-PCR (FLU A&B, COVID) [id number]   ____________________________________________  EKG  Vent. rate 74 BPM PR interval * ms QRS duration 122 ms QT/QTcB 426/472 ms P-R-T axes * -49 42 Left axis deviation  RBBB No STEMI ____________________________________________  RADIOLOGY I, Melvenia Needles, personally viewed and evaluated these images (plain radiographs) as part of my medical decision making, as well as reviewing the written report by the radiologist.  ED MD interpretation:  agree with report  Official radiology report(s): DG Chest 2 View  Result Date: 06/19/2021 CLINICAL DATA:  Chest pain EXAM: CHEST - 2 VIEW COMPARISON:  None. FINDINGS: Post CABG changes. Mild cardiomegaly. Diffusely prominent interstitial markings bilaterally. Streaky bibasilar opacities. No pleural effusion or pneumothorax. IMPRESSION: 1. Mild cardiomegaly with diffusely prominent interstitial markings. This may reflect chronic interstitial lung changes or mild edema. 2. Streaky bibasilar opacities, likely atelectasis. Electronically Signed   By: Davina Poke D.O.   On: 06/19/2021 10:09    ____________________________________________   PROCEDURES  Procedure(s) performed (including Critical Care):  Procedures  Lasix 40 mg  IVP ____________________________________________   INITIAL IMPRESSION / ASSESSMENT AND PLAN / ED COURSE  As part of my medical decision making, I reviewed the following data within the [place] reviewed as noted, EKG interpreted NSR, RBBB, and no ST segment changes, Radiograph reviewed NAD, and Notes from prior ED visits   Differential diagnosis includes, but is not limited to, ACS, aortic dissection, pulmonary embolism, cardiac tamponade, pneumothorax, pneumonia, pericarditis, myocarditis, GI-related causes including esophagitis/gastritis, and musculoskeletal chest wall pain.    Patient  went ED evaluation of several days of central chest pain with shortness of breath and fatigue.  Patient was evaluated for his complaints in the ED.  He was found to have an uptrending troponin from 23 to 36.  He also had a BNP that elevated at 430.  Patient clinical picture is consistent with likely exacerbation of his CHF.  He will be admitted to the hospital service for observation status for chest pain and CHF exacerbation.  Patient is agreeable to plan of admission. ____________________________________________   FINAL CLINICAL IMPRESSION(S) / ED DIAGNOSES  Final diagnoses:  Chest pain, unspecified type  Acute on chronic congestive heart failure, unspecified heart failure type [facility])     ED Discharge Orders     None        Note:  This document was prepared using Dragon voice recognition software and may include unintentional dictation errors.    Melvenia Needles, PA-C 06/19/21 1237    Carrie Mew, MD 06/19/21 1308

## 2021-06-20 DIAGNOSIS — E785 Hyperlipidemia, unspecified: Secondary | ICD-10-CM

## 2021-06-20 DIAGNOSIS — I5031 Acute diastolic (congestive) heart failure: Secondary | ICD-10-CM | POA: Diagnosis not present

## 2021-06-20 DIAGNOSIS — I10 Essential (primary) hypertension: Secondary | ICD-10-CM | POA: Diagnosis not present

## 2021-06-20 LAB — CBC
HCT: 34 % — ABNORMAL LOW (ref 39.0–52.0)
Hemoglobin: 11.2 g/dL — ABNORMAL LOW (ref 13.0–17.0)
MCH: 27 pg (ref 26.0–34.0)
MCHC: 32.9 g/dL (ref 30.0–36.0)
MCV: 81.9 fL (ref 80.0–100.0)
Platelets: 193 10*3/uL (ref 150–400)
RBC: 4.15 MIL/uL — ABNORMAL LOW (ref 4.22–5.81)
RDW: 16 % — ABNORMAL HIGH (ref 11.5–15.5)
WBC: 7.8 10*3/uL (ref 4.0–10.5)
nRBC: 0 % (ref 0.0–0.2)

## 2021-06-20 LAB — BASIC METABOLIC PANEL
Anion gap: 7 (ref 5–15)
BUN: 21 mg/dL (ref 8–23)
CO2: 25 mmol/L (ref 22–32)
Calcium: 8.6 mg/dL — ABNORMAL LOW (ref 8.9–10.3)
Chloride: 104 mmol/L (ref 98–111)
Creatinine, Ser: 0.87 mg/dL (ref 0.61–1.24)
GFR, Estimated: >60 mL/min (ref >60)
Glucose, Bld: 92 mg/dL (ref 70–99)
Potassium: 3.6 mmol/L (ref 3.5–5.1)
Sodium: 136 mmol/L (ref 135–145)

## 2021-06-20 NOTE — Discharge Summary (Signed)
Physician Discharge Summary  Gregory Shepherd:[id number] DOB: 11-20-1946 DOA: 06/19/2021  PCP: Kirk Ruths, MD  Admit date: 06/19/2021 Discharge date: 06/20/2021  Admitted From: home  Disposition:  home   Recommendations for Outpatient Follow-up:  Follow up with PCP in 1-2 weeks F/u w/ cardio in 1 week   Home Health: no  Equipment/Devices:  Discharge Condition: stable CODE STATUS: full  Diet recommendation: Heart Healthy  Brief/Interim Summary: HPI was taken from Dr. Tobie Poet: Gregory Shepherd is a 75 y.o. male with medical history significant for grade 2 diastolic dysfunction, hypertension, history of CAD status post CABG, hyperlipidemia, BPH, neuropathy, current tobacco use, presents to the emergency department for chief concerns of shortness of breath.   He endorses that he has shortness of breath that is worse with laying flat and on going for two weeks.  He states that in the last couple weeks he has also been having chest pain and burning pain.  He reports that over the last 2 weeks the pain went away with nitroglycerin.  However, he states that the chest pain/burning followed by sharp pain that lasted all night and was unrelieved with nitroglycerin.   This prompted him to present to the ED. At bedside, he denies chest pain at this time. He denies fever, nausea, vomiting, abdominal pain, dysuria, cough, syncope, lost of consciousness.   He drinks about three-four 17 oz bottles of water and/or tea per day. Three 8 oz cups of coffee every morning. 0.5 bottles of soda per day. He denies soup as it is summer.  Pt was treated w/ IV lasix and pt's shortness of breath resolved. Pt did receive education on fluid intake/weights. Pt verbalized his understanding. Pt will f/u outpatient w/ his cardiologist w/in 1 week. Pt was ambulating independently in the hallways so PT/OT was not needed  Discharge Diagnoses:  Principal Problem:   Shortness of breath Active Problems:   Hx of  CABG   BPH with obstruction/lower urinary tract symptoms   Essential (primary) hypertension   Hyperlipemia   Chronic GERD  Acute on chronic diastolic CHF exacerbation: continue on lasix. Monitor I/Os and daily weights.    Elevated troponin: likely secondary to demand ischemia   Hx of CAD:  s/p CABG. No chest pain. Continue on home dose of plavix, aspirin, statin, atenolol, imdur  Smoker: smoking cessation counseling  HTN: continue on atenolol, amlodipine, isosorbide mononitrate   HLD: continue on statin  Insomnia: continue on home dose of trazodone   GERD: continue on PPI  BPH: continue on home dose of finasteride   Discharge Instructions  Discharge Instructions     Diet - low sodium heart healthy   Complete by: As directed    Discharge instructions   Complete by: As directed    F/u w/ cardio in 1 week. F/u w/ PCP in 1-2 weeks   Increase activity slowly   Complete by: As directed       Allergies as of 06/20/2021       Reactions   Omeprazole    doesn't work well with plavix    Other Palpitations   Certain steroids give patient heart palpitations  Allergy to green peppers        Medication List     TAKE these medications    amLODipine 5 MG tablet Commonly known as: NORVASC Take 5 mg by mouth daily.   aspirin 81 MG chewable tablet Chew by mouth daily.   atenolol 25 MG tablet Commonly known as: TENORMIN  Take 25 mg by mouth daily.   atorvastatin 80 MG tablet Commonly known as: LIPITOR Take 80 mg by mouth daily.   cholecalciferol 25 MCG (1000 UNIT) tablet Commonly known as: VITAMIN D3 Take 1,000 Units by mouth daily.   clopidogrel 75 MG tablet Commonly known as: PLAVIX TAKE 1 TABLET BY MOUTH ONCE DAILY WITH BREAKFAST   finasteride 5 MG tablet Commonly known as: PROSCAR Take 1 tablet (5 mg total) by mouth daily.   furosemide 20 MG tablet Commonly known as: LASIX Take 1 tablet (20 mg total) by mouth daily.   gabapentin 300 MG  capsule Commonly known as: NEURONTIN Take 1 capsule by mouth in the morning, at noon, in the evening, and at bedtime.   isosorbide mononitrate 60 MG 24 hr tablet Commonly known as: IMDUR Take 60 mg by mouth daily.   multivitamin tablet Take 1 tablet by mouth daily.   nitroGLYCERIN 0.4 MG SL tablet Commonly known as: NITROSTAT as needed.   potassium chloride 10 MEQ tablet Commonly known as: KLOR-CON Take 1 tablet (10 mEq total) by mouth daily.   traZODone 50 MG tablet Commonly known as: DESYREL 0.5 tablets 2 (two) times daily.   vitamin B-12 1000 MCG tablet Commonly known as: CYANOCOBALAMIN Take 1,000 mcg by mouth daily.   vitamin C 250 MG tablet Commonly known as: ASCORBIC ACID Take 250 mg by mouth daily.       ASK your doctor about these medications    lansoprazole 30 MG capsule Commonly known as: PREVACID Take 1 capsule (30 mg total) by mouth 2 (two) times daily before a meal.        Allergies  Allergen Reactions   Omeprazole     doesn't work well with plavix    Other Palpitations    Certain steroids give patient heart palpitations  Allergy to green peppers    Consultations:    Procedures/Studies: DG Chest 2 View  Result Date: 06/19/2021 CLINICAL DATA:  Chest pain EXAM: CHEST - 2 VIEW COMPARISON:  None. FINDINGS: Post CABG changes. Mild cardiomegaly. Diffusely prominent interstitial markings bilaterally. Streaky bibasilar opacities. No pleural effusion or pneumothorax. IMPRESSION: 1. Mild cardiomegaly with diffusely prominent interstitial markings. This may reflect chronic interstitial lung changes or mild edema. 2. Streaky bibasilar opacities, likely atelectasis. Electronically Signed   By: Davina Poke D.O.   On: 06/19/2021 10:09   (Echo, Carotid, EGD, Colonoscopy, ERCP)    Subjective: Pt denies any shortness of breath    Discharge Exam: Vitals:   06/20/21 0537 06/20/21 0740  BP: 127/72 (!) 152/65  Pulse: 64 70  Resp: 16 20  Temp: (!)  97.5 F (36.4 C) 98.3 F (36.8 C)  SpO2: 91% 96%   Vitals:   06/19/21 2019 06/19/21 2120 06/20/21 0537 06/20/21 0740  BP: (!) 143/76 137/71 127/72 (!) 152/65  Pulse: (!) 59 71 64 70  Resp: 17 16 16 20   Temp:  (!) 97.5 F (36.4 C) (!) 97.5 F (36.4 C) 98.3 F (36.8 C)  TempSrc:      SpO2: 96% 96% 91% 96%  Weight:      Height:        General: Pt is alert, awake, not in acute distress Cardiovascular: S1/S2 +, no rubs, no gallops Respiratory: CTA bilaterally, no wheezing, no rhonchi Abdominal: Soft, NT, ND, bowel sounds + Extremities: no cyanosis    The results of significant diagnostics from this hospitalization (including imaging, microbiology, ancillary and laboratory) are listed below for reference.  Microbiology: Recent Results (from the past 240 hour(s))  Resp Panel by RT-PCR (Flu A&B, Covid) Nasopharyngeal Swab     Status: None   Collection Time: 06/19/21 12:12 PM   Specimen: Nasopharyngeal Swab; Nasopharyngeal(NP) swabs in vial transport medium  Result Value Ref Range Status   SARS Coronavirus 2 by RT PCR NEGATIVE NEGATIVE Final    Comment: (NOTE) SARS-CoV-2 target nucleic acids are NOT DETECTED.  The SARS-CoV-2 RNA is generally detectable in upper respiratory specimens during the acute phase of infection. The lowest concentration of SARS-CoV-2 viral copies this assay can detect is 138 copies/mL. A negative result does not preclude SARS-Cov-2 infection and should not be used as the sole basis for treatment or other patient management decisions. A negative result may occur with  improper specimen collection/handling, submission of specimen other than nasopharyngeal swab, presence of viral mutation(s) within the areas targeted by this assay, and inadequate number of viral copies(<138 copies/mL). A negative result must be combined with clinical observations, patient history, and epidemiological information. The expected result is Negative.  Fact Sheet for  Patients:  EntrepreneurPulse.com.au  Fact Sheet for Healthcare Providers:  IncredibleEmployment.be  This test is no t yet approved or cleared by the [place] FDA and  has been authorized for detection and/or diagnosis of SARS-CoV-2 by FDA under an Emergency Use Authorization (EUA). This EUA will remain  in effect (meaning this test can be used) for the duration of the COVID-19 declaration under Section 564(b)(1) of the Act, 21 U.S.C.section 360bbb-3(b)(1), unless the authorization is terminated  or revoked sooner.       Influenza A by PCR NEGATIVE NEGATIVE Final   Influenza B by PCR NEGATIVE NEGATIVE Final    Comment: (NOTE) The Xpert Xpress SARS-CoV-2/FLU/RSV plus assay is intended as an aid in the diagnosis of influenza from Nasopharyngeal swab specimens and should not be used as a sole basis for treatment. Nasal washings and aspirates are unacceptable for Xpert Xpress SARS-CoV-2/FLU/RSV testing.  Fact Sheet for Patients: EntrepreneurPulse.com.au  Fact Sheet for Healthcare Providers: IncredibleEmployment.be  This test is not yet approved or cleared by the [place] FDA and has been authorized for detection and/or diagnosis of SARS-CoV-2 by FDA under an Emergency Use Authorization (EUA). This EUA will remain in effect (meaning this test can be used) for the duration of the COVID-19 declaration under Section 564(b)(1) of the Act, 21 U.S.C. section 360bbb-3(b)(1), unless the authorization is terminated or revoked.  Performed at [facility], [address] [place], [place] [address]      Labs: BNP (last 3 results) Recent Labs    06/19/21 0931  BNP 329.9*   Basic Metabolic Panel: Recent Labs  Lab 06/19/21 0931 06/20/21 0531  NA 138 136  K 3.8 3.6  CL 106 104  CO2 23 25  GLUCOSE 138* 92  BUN 19 21  CREATININE 0.82 0.87  CALCIUM 8.8* 8.6*   Liver Function Tests: No  results for input(s): AST, ALT, ALKPHOS, BILITOT, PROT, ALBUMIN in the last 168 hours. No results for input(s): LIPASE, AMYLASE in the last 168 hours. No results for input(s): AMMONIA in the last 168 hours. CBC: Recent Labs  Lab 06/19/21 0931 06/20/21 0531  WBC 8.7 7.8  HGB 12.1* 11.2*  HCT 37.0* 34.0*  MCV 83.1 81.9  PLT 196 193   Cardiac Enzymes: No results for input(s): CKTOTAL, CKMB, CKMBINDEX, TROPONINI in the last 168 hours. BNP: Invalid input(s): POCBNP CBG: No results for input(s): GLUCAP in the last 168 hours. D-Dimer  No results for input(s): DDIMER in the last 72 hours. Hgb A1c No results for input(s): HGBA1C in the last 72 hours. Lipid Profile No results for input(s): CHOL, HDL, LDLCALC, TRIG, CHOLHDL, LDLDIRECT in the last 72 hours. Thyroid function studies Recent Labs    06/19/21 1108  TSH 2.699   Anemia work up No results for input(s): VITAMINB12, FOLATE, FERRITIN, TIBC, IRON, RETICCTPCT in the last 72 hours. Urinalysis No results found for: COLORURINE, APPEARANCEUR, [place], [place], [place], [place], [place], [place], PROTEINUR, UROBILINOGEN, NITRITE, LEUKOCYTESUR Sepsis Labs Invalid input(s): PROCALCITONIN,  WBC,  LACTICIDVEN Microbiology Recent Results (from the past 240 hour(s))  Resp Panel by RT-PCR (Flu A&B, Covid) Nasopharyngeal Swab     Status: None   Collection Time: 06/19/21 12:12 PM   Specimen: Nasopharyngeal Swab; Nasopharyngeal(NP) swabs in vial transport medium  Result Value Ref Range Status   SARS Coronavirus 2 by RT PCR NEGATIVE NEGATIVE Final    Comment: (NOTE) SARS-CoV-2 target nucleic acids are NOT DETECTED.  The SARS-CoV-2 RNA is generally detectable in upper respiratory specimens during the acute phase of infection. The lowest concentration of SARS-CoV-2 viral copies this assay can detect is 138 copies/mL. A negative result does not preclude SARS-Cov-2 infection and should not be used as the sole basis for treatment  or other patient management decisions. A negative result may occur with  improper specimen collection/handling, submission of specimen other than nasopharyngeal swab, presence of viral mutation(s) within the areas targeted by this assay, and inadequate number of viral copies(<138 copies/mL). A negative result must be combined with clinical observations, patient history, and epidemiological information. The expected result is Negative.  Fact Sheet for Patients:  EntrepreneurPulse.com.au  Fact Sheet for Healthcare Providers:  IncredibleEmployment.be  This test is no t yet approved or cleared by the [place] FDA and  has been authorized for detection and/or diagnosis of SARS-CoV-2 by FDA under an Emergency Use Authorization (EUA). This EUA will remain  in effect (meaning this test can be used) for the duration of the COVID-19 declaration under Section 564(b)(1) of the Act, 21 U.S.C.section 360bbb-3(b)(1), unless the authorization is terminated  or revoked sooner.       Influenza A by PCR NEGATIVE NEGATIVE Final   Influenza B by PCR NEGATIVE NEGATIVE Final    Comment: (NOTE) The Xpert Xpress SARS-CoV-2/FLU/RSV plus assay is intended as an aid in the diagnosis of influenza from Nasopharyngeal swab specimens and should not be used as a sole basis for treatment. Nasal washings and aspirates are unacceptable for Xpert Xpress SARS-CoV-2/FLU/RSV testing.  Fact Sheet for Patients: EntrepreneurPulse.com.au  Fact Sheet for Healthcare Providers: IncredibleEmployment.be  This test is not yet approved or cleared by the [place] FDA and has been authorized for detection and/or diagnosis of SARS-CoV-2 by FDA under an Emergency Use Authorization (EUA). This EUA will remain in effect (meaning this test can be used) for the duration of the COVID-19 declaration under Section 564(b)(1) of the Act, 21 U.S.C. section  360bbb-3(b)(1), unless the authorization is terminated or revoked.  Performed at [facility], [address] [place], [place] [address]      Time coordinating discharge: Over 30 minutes  SIGNED:   Wyvonnia Dusky, MD  [facility] 06/20/2021, 12:05 PM Pager   If 7PM-7AM, please contact night-coverage

## 2021-06-30 DIAGNOSIS — I25119 Atherosclerotic heart disease of native coronary artery with unspecified angina pectoris: Secondary | ICD-10-CM | POA: Diagnosis not present

## 2021-06-30 DIAGNOSIS — R7303 Prediabetes: Secondary | ICD-10-CM | POA: Diagnosis not present

## 2021-06-30 DIAGNOSIS — I10 Essential (primary) hypertension: Secondary | ICD-10-CM | POA: Diagnosis not present

## 2021-07-07 DIAGNOSIS — R7303 Prediabetes: Secondary | ICD-10-CM | POA: Diagnosis not present

## 2021-07-07 DIAGNOSIS — I25119 Atherosclerotic heart disease of native coronary artery with unspecified angina pectoris: Secondary | ICD-10-CM | POA: Diagnosis not present

## 2021-07-07 DIAGNOSIS — I739 Peripheral vascular disease, unspecified: Secondary | ICD-10-CM | POA: Diagnosis not present

## 2021-07-07 DIAGNOSIS — I10 Essential (primary) hypertension: Secondary | ICD-10-CM | POA: Diagnosis not present

## 2021-07-13 DIAGNOSIS — R252 Cramp and spasm: Secondary | ICD-10-CM | POA: Diagnosis not present

## 2021-07-15 DIAGNOSIS — I739 Peripheral vascular disease, unspecified: Secondary | ICD-10-CM | POA: Diagnosis not present

## 2021-07-15 DIAGNOSIS — I10 Essential (primary) hypertension: Secondary | ICD-10-CM | POA: Diagnosis not present

## 2021-07-15 DIAGNOSIS — R7303 Prediabetes: Secondary | ICD-10-CM | POA: Diagnosis not present

## 2021-07-15 DIAGNOSIS — I25119 Atherosclerotic heart disease of native coronary artery with unspecified angina pectoris: Secondary | ICD-10-CM | POA: Diagnosis not present

## 2021-08-03 DIAGNOSIS — J811 Chronic pulmonary edema: Secondary | ICD-10-CM | POA: Diagnosis not present

## 2021-08-03 DIAGNOSIS — J90 Pleural effusion, not elsewhere classified: Secondary | ICD-10-CM | POA: Diagnosis not present

## 2021-08-03 DIAGNOSIS — R0602 Shortness of breath: Secondary | ICD-10-CM | POA: Diagnosis not present

## 2021-08-12 ENCOUNTER — Encounter: Payer: Self-pay | Admitting: Physician Assistant

## 2021-08-12 ENCOUNTER — Ambulatory Visit: Payer: Medicare Other | Admitting: Physician Assistant

## 2021-08-12 ENCOUNTER — Other Ambulatory Visit: Payer: Self-pay

## 2021-08-12 VITALS — BP 115/60 | HR 63 | Ht 73.0 in | Wt 195.2 lb

## 2021-08-12 DIAGNOSIS — R0602 Shortness of breath: Secondary | ICD-10-CM

## 2021-08-12 DIAGNOSIS — E785 Hyperlipidemia, unspecified: Secondary | ICD-10-CM

## 2021-08-12 DIAGNOSIS — I25810 Atherosclerosis of coronary artery bypass graft(s) without angina pectoris: Secondary | ICD-10-CM | POA: Diagnosis not present

## 2021-08-12 DIAGNOSIS — I5033 Acute on chronic diastolic (congestive) heart failure: Secondary | ICD-10-CM

## 2021-08-12 DIAGNOSIS — I4819 Other persistent atrial fibrillation: Secondary | ICD-10-CM

## 2021-08-12 DIAGNOSIS — I10 Essential (primary) hypertension: Secondary | ICD-10-CM

## 2021-08-12 MED ORDER — POTASSIUM CHLORIDE ER 10 MEQ PO TBCR: 20.0000 meq | EXTENDED_RELEASE_TABLET | Freq: Two times a day (BID) | ORAL | 2 refills | Status: DC

## 2021-08-12 MED ORDER — APIXABAN 5 MG PO TABS: 5.0000 mg | ORAL_TABLET | Freq: Two times a day (BID) | ORAL | 0 refills | Status: DC

## 2021-08-12 MED ORDER — TORSEMIDE 20 MG PO TABS
40.0000 mg | ORAL_TABLET | Freq: Two times a day (BID) | ORAL | 2 refills | Status: DC
Start: 2021-08-12 — End: 2021-09-08

## 2021-08-12 NOTE — Patient Instructions (Addendum)
Medication Instructions:  STOP Plavix  START Eliquis (Apixaban) 5 mg 2 times a day INCREASE Torsemide to 40 mg 2 times a day  INCREASE Potassium to 20 meq 2 times a day  *If you need a refill on your cardiac medications before your next appointment, please call your pharmacy*  Lab Work: Your physician recommends that you return for lab work in [place]:  BMET   Your physician recommends that you return for lab work in [place] (08/19/21):  BMET If you have labs (blood work) drawn today and your tests are completely normal, you will receive your results only by: [place] (if you have MyChart) OR A paper copy in the mail If you have any lab test that is abnormal or we need to change your treatment, we will call you to review the results.  Testing/Procedures: Your physician has requested that you have an echocardiogram. Echocardiography is a painless test that uses sound waves to create images of your heart. It provides your doctor with information about the size and shape of your heart and how well your heart's chambers and valves are working. This procedure takes approximately one hour. There are no restrictions for this procedure.  Please schedule for at the [facility] office   Follow-Up: At [facility], you and your health needs are our priority.  As part of our continuing mission to provide you with exceptional heart care, we have created designated Provider Care Teams.  These Care Teams include your primary Cardiologist (physician) and Advanced Practice Providers (APPs -  Physician Assistants and Nurse Practitioners) who all work together to provide you with the care you need, when you need it.   Your next appointment:   1 week(s)  The format for your next appointment:   In Person  Provider:   Almyra Deforest, PA-C  Other Instructions

## 2021-08-12 NOTE — Progress Notes (Signed)
Cardiology Office Note:    Date:  08/15/2021   ID:  Gregory Shepherd, Gregory Shepherd 1946/02/10, MRN [id number]  PCP:  Kirk Ruths, MD   [facility] Providers Cardiologist:  Quay Burow, MD     Referring MD: Kirk Ruths, MD   Chief Complaint  Patient presents with   Follow-up    Seen for Dr. Gwenlyn Found    History of Present Illness:    Gregory Shepherd is a 75 y.o. male with a hx of hypertension, hyperlipidemia, tobacco abuse, CAD s/p CABG( x2 at [facility] by Dr. Eliberto Ivory, redo x3 at [facility]) and PAD.  He had a remote MI at age 80.  He was referred to Dr. Gwenlyn Found for evaluation of claudication symptoms.  Lower extremity Doppler performed in March 2020 revealed right ABI 0.6, left ABI 0.8.  He underwent lower extremity angiography in June 2020 revealing 99% calcified ostial to proximal right common iliac artery stenosis, 60 to 70% left common iliac artery stenosis.  He underwent diamondback orbital rotational atherectomy, PTA and stenting with excellent result.  2D echo performed in March 2022 revealed a normal EF, grade 2 DD, normal valve function.  More recently, patient presented to the hospital in July 2022 with shortness of breath.  The symptom has been worsening laying flat for 2 weeks.  He also endorsed some chest burning sensation.  BNP was 430.  His symptom was attributed to acute on chronic diastolic heart failure and was treated with IV Lasix.  Elevated troponins (22 --> 36) during the hospitalization was attributed to demand ischemia.  Since discharge, his breathing initially improved, however worsened in the past few weeks.  His Lasix has been switched to torsemide 20 mg.  Currently he is taking 40 mg daily of torsemide.  He is also taking 10 mill equivalent twice a day of potassium chloride.  On physical exam, he has at least 2-3+ pitting edema in bilateral lower extremity.  He has bibasilar crackles on exam in the lung.  Patient is clearly volume overloaded.   Looking at the previous EKG, EKG obtained during the hospitalization in July was initially interpreted as undetermined rhythm, however on closer look, it appears that patient was in new onset of atrial fibrillation at the time.  We will repeat a EKG today which confirmed that the patient is indeed in rate controlled atrial fibrillation at this time.  I would recommend to stop Plavix and switch him to Eliquis 5 mg twice a day.  Ideally, he should be admitted to the hospital to undergo IV diuresis and potentially consider TEE cardioversion, however patient wished to do a trial of outpatient management first.  I will increase his torsemide to 40 mg twice daily dosing.  I will also increase his potassium to 20 mill equivalent twice daily dosing.  We will obtain a basic metabolic panel today and also in 1 week.  He will need limited echocardiogram to reassess ejection fraction given the drastic volume overload in the past few months.  His O2 saturation in the clinic today is around 89 to 90% sitting down.  I have very low threshold of sending the patient to the hospital if his breathing continues to deteriorate.  He does endorse orthopnea but no paroxysmal nocturnal dyspnea recently.  I plan to bring the patient back in 1 week for clinical reassessment.  If his symptoms does not improve, again every recommended directly admit the patient to the hospital.  Note, I suspect the patient  has been in atrial fibrillation since July.  Past Medical History:  Diagnosis Date   Carotid artery narrowing    History of blood clots    History of colon polyps    Hyperlipidemia    Hypertension    MI (myocardial infarction) [facility])     Past Surgical History:  Procedure Laterality Date   ABDOMINAL AORTOGRAM W/LOWER EXTREMITY N/A 06/04/2019   Procedure: ABDOMINAL AORTOGRAM W/LOWER EXTREMITY;  Surgeon: Lorretta Harp, MD;  Location: [place] [facility];  Service: Cardiovascular;  Laterality: N/A;   CARDIAC SURGERY     x2     COLONOSCOPY     Age 28. [facility]   CORONARY ARTERY BYPASS GRAFT  1990   x 2 at [facility], redo x 3 @ [place] approx 1998   PERIPHERAL VASCULAR ATHERECTOMY Right 06/04/2019   Procedure: PERIPHERAL VASCULAR ATHERECTOMY;  Surgeon: Lorretta Harp, MD;  Location: [place] [facility];  Service: Cardiovascular;  Laterality: Right;  common iliac   PERIPHERAL VASCULAR INTERVENTION Right 06/04/2019   Procedure: PERIPHERAL VASCULAR INTERVENTION;  Surgeon: Lorretta Harp, MD;  Location: [place] [facility];  Service: Cardiovascular;  Laterality: Right;  common iliac    Current Medications: Current Meds  Medication Sig   apixaban (ELIQUIS) 5 MG TABS tablet Take 1 tablet (5 mg total) by mouth 2 (two) times daily.   aspirin 81 MG chewable tablet Chew by mouth daily.   atenolol (TENORMIN) 25 MG tablet Take 25 mg by mouth daily.   atorvastatin (LIPITOR) 80 MG tablet Take 80 mg by mouth daily.   cholecalciferol (VITAMIN D3) 25 MCG (1000 UT) tablet Take 1,000 Units by mouth daily.   finasteride (PROSCAR) 5 MG tablet Take 1 tablet (5 mg total) by mouth daily.   gabapentin (NEURONTIN) 300 MG capsule Take 1 capsule by mouth in the morning, at noon, in the evening, and at bedtime.   isosorbide mononitrate (IMDUR) 60 MG 24 hr tablet Take 60 mg by mouth daily.   lansoprazole (PREVACID) 30 MG capsule Take 1 capsule (30 mg total) by mouth 2 (two) times daily before a meal. (Patient taking differently: Take 30 mg by mouth daily before supper. 30 minutes before)   Multiple Vitamin (MULTIVITAMIN) tablet Take 1 tablet by mouth daily.   nitroGLYCERIN (NITROSTAT) 0.4 MG SL tablet as needed.   traZODone (DESYREL) 50 MG tablet 0.5 tablets 2 (two) times daily.   vitamin B-12 (CYANOCOBALAMIN) 1000 MCG tablet Take 1,000 mcg by mouth daily.   vitamin C (ASCORBIC ACID) 250 MG tablet Take 250 mg by mouth daily.   [DISCONTINUED] clopidogrel (PLAVIX) 75 MG tablet TAKE 1 TABLET BY MOUTH ONCE DAILY WITH BREAKFAST      Allergies:   Omeprazole and Other   Social History   Socioeconomic History   Marital status: Married    Spouse name: Not on file   Number of children: Not on file   Years of education: Not on file   Highest education level: Not on file  Occupational History   Not on file  Tobacco Use   Smoking status: Every Day    Types: Cigarettes   Smokeless tobacco: Never   Tobacco comments:    8-9 cigarettes per day  Vaping Use   Vaping Use: Never used  Substance and Sexual Activity   Alcohol use: No   Drug use: No   Sexual activity: Not on file  Other Topics Concern   Not on file  Social History Narrative  Not on file   Social Determinants of Health   Financial Resource Strain: Not on file  Food Insecurity: Not on file  Transportation Needs: Not on file  Physical Activity: Not on file  Stress: Not on file  Social Connections: Not on file     Family History: The patient's family history includes Diabetes in his sister; Heart disease in his father and mother. There is no history of Prostate cancer, Bladder Cancer, Kidney cancer, Colon cancer, or Esophageal cancer.  ROS:   Please see the history of present illness.     All other systems reviewed and are negative.  EKGs/Labs/Other Studies Reviewed:    The following studies were reviewed today:  Echo 08/13/2021  1. Left ventricular ejection fraction, by estimation, is 35 to 40%. The  left ventricle has moderately decreased function. The left ventricle  demonstrates global hypokinesis. There is mild asymmetric left ventricular  hypertrophy of the basal-septal  segment. Left ventricular diastolic function could not be evaluated.   2. Right ventricular systolic function is moderately reduced. The right  ventricular size is normal. Tricuspid regurgitation signal is inadequate  for assessing PA pressure.   3. Left atrial size was moderately dilated.   4. Moderate pleural effusion in the left lateral region.   5. The mitral  valve is degenerative. Mild mitral valve regurgitation. No  evidence of mitral stenosis.   6. The aortic valve is tricuspid. There is moderate calcification of the  aortic valve. There is mild thickening of the aortic valve. Aortic valve  regurgitation is mild. Mild to moderate aortic valve  sclerosis/calcification is present, without any  evidence of aortic stenosis.   7. The inferior vena cava is dilated in size with <50% respiratory  variability, suggesting right atrial pressure of 15 mmHg.   EKG:  EKG is ordered today.  The ekg ordered today demonstrates atrial fibrillation, right bundle branch block  Recent Labs: 06/19/2021: B Natriuretic Peptide 430.2; TSH 2.699 06/20/2021: Hemoglobin 11.2; Platelets 193 08/12/2021: BUN 23; Creatinine, Ser 1.33; Potassium 4.7; Sodium 138  Recent Lipid Panel No results found for: CHOL, TRIG, HDL, CHOLHDL, VLDL, LDLCALC, LDLDIRECT   Risk Assessment/Calculations:    CHA2DS2-VASc Score = 5   This indicates a 7.2% annual risk of stroke. The patient's score is based upon: CHF History: 1 HTN History: 1 Diabetes History: 0 Stroke History: 0 Vascular Disease History: 1 Age Score: 2 Gender Score: 0         Physical Exam:    VS:  BP 115/60   Pulse 63   Ht '6\' 1"'$  (1.854 m)   Wt 195 lb 3.2 oz (88.5 kg)   SpO2 93%   BMI 25.75 kg/m     Wt Readings from Last 3 Encounters:  08/12/21 195 lb 3.2 oz (88.5 kg)  06/19/21 195 lb (88.5 kg)  04/15/21 201 lb 12.8 oz (91.5 kg)     GEN:  Well nourished, well developed in no acute distress HEENT: Normal NECK: No JVD; No carotid bruits LYMPHATICS: No lymphadenopathy CARDIAC: Irregularly irregular, no murmurs, rubs, gallops RESPIRATORY:  Clear to auscultation without rales, wheezing or rhonchi  ABDOMEN: Soft, non-tender, non-distended MUSCULOSKELETAL:  3+ edema; No deformity  SKIN: Warm and dry NEUROLOGIC:  Alert and oriented x 3 PSYCHIATRIC:  Normal affect   ASSESSMENT:    1. Persistent atrial  fibrillation ([place])   2. SOB (shortness of breath)   3. Acute on chronic diastolic heart failure ([place])   4. Coronary artery disease involving coronary bypass  graft of native heart without angina pectoris   5. Essential (primary) hypertension   6. Hyperlipidemia LDL goal <70    PLAN:    In order of problems listed above:  Persistent atrial fibrillation: Newly diagnosed.  Based on EKG, patient likely was already in atrial fibrillation in July during the previous admission, however he was not diagnosed at that time for some reason.  I recommended start on Eliquis and stop Plavix.  Shortness of breath: Patient is clearly volume overloaded.  Resting O2 saturation 89-90% in the clinic while sitting down.  See #3 below  Acute on chronic diastolic heart failure: Patient has been having orthopnea but no PND in the past few weeks.  He was admitted in July for heart failure, he underwent IV diuresis and discharged.  Since discharge, he has accumulated the fluid back.  He currently has at least 3+ pitting edema in bilateral lower extremity.  I suspect his volume overload is related to the persistent atrial fibrillation.  I initially recommended direct hospital admission to receive IV diuresis and eventually underwent TEE DCCV, however patient was very hesitant to go to the hospital and wished to try a 1 week trial of outpatient management.  He is aware that I would have very low threshold of sending him to the emergency room if he is symptom worsens or does not respond to the higher dose of diuretic.  During the meantime.  I will increase his torsemide to 40 mg twice a day and the potassium and chloride supplement to 20 meq twice a day  CAD s/p CABG: Denies any recent chest pain.  However given worsening heart failure, I recommended repeat limited echocardiogram  Hypertension: Blood pressure stable on current therapy  Hyperlipidemia: On Lipitor        Medication Adjustments/Labs and Tests  Ordered: Current medicines are reviewed at length with the patient today.  Concerns regarding medicines are outlined above.  Orders Placed This Encounter  Procedures   Basic metabolic panel   Basic metabolic panel   EKG [id number]   Meds ordered this encounter  Medications   torsemide (DEMADEX) 20 MG tablet    Sig: Take 2 tablets (40 mg total) by mouth 2 (two) times daily.    Dispense:  120 tablet    Refill:  2    Dose change Rx increase   potassium chloride (KLOR-CON) 10 MEQ tablet    Sig: Take 2 tablets (20 mEq total) by mouth 2 (two) times daily.    Dispense:  120 tablet    Refill:  2    Dose change Rx increased   apixaban (ELIQUIS) 5 MG TABS tablet    Sig: Take 1 tablet (5 mg total) by mouth 2 (two) times daily.    Dispense:  180 tablet    Refill:  0    Patient Instructions  Medication Instructions:  STOP Plavix  START Eliquis (Apixaban) 5 mg 2 times a day INCREASE Torsemide to 40 mg 2 times a day  INCREASE Potassium to 20 meq 2 times a day  *If you need a refill on your cardiac medications before your next appointment, please call your pharmacy*  Lab Work: Your physician recommends that you return for lab work in [place]:  BMET   Your physician recommends that you return for lab work in [place] (08/19/21):  BMET If you have labs (blood work) drawn today and your tests are completely normal, you will receive your results only by: [place] (if you have  MyChart) OR A paper copy in the mail If you have any lab test that is abnormal or we need to change your treatment, we will call you to review the results.  Testing/Procedures: Your physician has requested that you have an echocardiogram. Echocardiography is a painless test that uses sound waves to create images of your heart. It provides your doctor with information about the size and shape of your heart and how well your heart's chambers and valves are working. This procedure takes approximately one hour. There are  no restrictions for this procedure.  Please schedule for at the [facility] office   Follow-Up: At [facility], you and your health needs are our priority.  As part of our continuing mission to provide you with exceptional heart care, we have created designated Provider Care Teams.  These Care Teams include your primary Cardiologist (physician) and Advanced Practice Providers (APPs -  Physician Assistants and Nurse Practitioners) who all work together to provide you with the care you need, when you need it.   Your next appointment:   1 week(s)  The format for your next appointment:   In Person  Provider:   Almyra Deforest, PA-C  Other Instructions    Signed, Almyra Deforest, [place]  08/15/2021 12:06 AM    [place]

## 2021-08-13 ENCOUNTER — Ambulatory Visit (HOSPITAL_COMMUNITY)
Admission: RE | Admit: 2021-08-13 | Discharge: 2021-08-13 | Disposition: A | Discharge status: Home / Self Care | Payer: Medicare Other | Source: Ambulatory Visit | Attending: Internal Medicine | Admitting: Internal Medicine

## 2021-08-13 ENCOUNTER — Other Ambulatory Visit: Payer: Self-pay | Admitting: Physician Assistant

## 2021-08-13 DIAGNOSIS — Z951 Presence of aortocoronary bypass graft: Secondary | ICD-10-CM | POA: Diagnosis not present

## 2021-08-13 DIAGNOSIS — R0602 Shortness of breath: Secondary | ICD-10-CM

## 2021-08-13 DIAGNOSIS — I080 Rheumatic disorders of both mitral and aortic valves: Secondary | ICD-10-CM | POA: Diagnosis not present

## 2021-08-13 LAB — BASIC METABOLIC PANEL
BUN/Creatinine Ratio: 17 (ref 10–24)
BUN: 23 mg/dL (ref 8–27)
CO2: 25 mmol/L (ref 20–29)
Calcium: 9.3 mg/dL (ref 8.6–10.2)
Chloride: 100 mmol/L (ref 96–106)
Creatinine, Ser: 1.33 mg/dL — ABNORMAL HIGH (ref 0.76–1.27)
Glucose: 95 mg/dL (ref 65–99)
Potassium: 4.7 mmol/L (ref 3.5–5.2)
Sodium: 138 mmol/L (ref 134–144)
eGFR: 56 mL/min/{1.73_m2} — ABNORMAL LOW (ref >59)

## 2021-08-13 LAB — ECHOCARDIOGRAM COMPLETE
AR max vel: 1.45 cm2
AV Area VTI: 1.27 cm2
AV Area mean vel: 1.38 cm2
AV Mean grad: 5 mmHg
AV Peak grad: 9.4 mmHg
AV Vena cont: 0.3 cm
Ao pk vel: 1.53 m/s
Calc EF: 25.3 %
MV M vel: 5.06 m/s
MV Peak grad: 102.4 mmHg
MV VTI: 0.77 cm2
P 1/2 time: 493 msec
Radius: 0.4 cm
S' Lateral: 4.6 cm
Single Plane A2C EF: 19 %
Single Plane A4C EF: 31.1 %

## 2021-08-13 NOTE — Progress Notes (Signed)
*  PRELIMINARY RESULTS* Echocardiogram 2D Echocardiogram has been performed.  Luisa Hart RDCS 08/13/2021, 2:37 PM

## 2021-08-13 NOTE — Progress Notes (Signed)
Kidney function slightly down when compare to 1 month ago, overall, plan is unchanged. I plan to repeat BMET when I see Mr. Gregory Shepherd back next week

## 2021-08-14 ENCOUNTER — Encounter: Payer: Self-pay | Admitting: Physician Assistant

## 2021-08-14 ENCOUNTER — Telehealth: Payer: Self-pay | Admitting: Physician Assistant

## 2021-08-14 NOTE — Telephone Encounter (Signed)
See phone note.  Patient has appointment with Almyra Deforest, PA on 9/12

## 2021-08-14 NOTE — Telephone Encounter (Signed)
New Message:     Patient's wife called. She wanted Gregory Shepherd to know that patient have decided that he does want the Cardioversion.

## 2021-08-14 NOTE — Telephone Encounter (Signed)
Pts wife called to report that he was seen by Almyra Deforest PA 08/12/21 and he had recommended that he possibly consider admission but he declined at the [place] but he is still not feeling well per his wife and he has thought about it and willing to have care as an inpatient but would like to wait until Monday 08/17/21.. I advised her that I will forward to [facility] for [facility] and for review of his recent Echo and will call her back with his recommendations.

## 2021-08-14 NOTE — Telephone Encounter (Signed)
Almyra Deforest PA called the pt at home and he asked to see the pt in the office at 8 am for possible direct admission to the hospital.

## 2021-08-17 ENCOUNTER — Ambulatory Visit: Payer: Medicare Other | Admitting: Physician Assistant

## 2021-08-17 ENCOUNTER — Other Ambulatory Visit: Payer: Self-pay

## 2021-08-17 ENCOUNTER — Encounter: Payer: Self-pay | Admitting: Physician Assistant

## 2021-08-17 VITALS — BP 118/66 | HR 71 | Ht 73.5 in | Wt 188.4 lb

## 2021-08-17 DIAGNOSIS — Z79899 Other long term (current) drug therapy: Secondary | ICD-10-CM | POA: Diagnosis not present

## 2021-08-17 DIAGNOSIS — I10 Essential (primary) hypertension: Secondary | ICD-10-CM

## 2021-08-17 DIAGNOSIS — E785 Hyperlipidemia, unspecified: Secondary | ICD-10-CM

## 2021-08-17 DIAGNOSIS — Z01818 Encounter for other preprocedural examination: Secondary | ICD-10-CM

## 2021-08-17 DIAGNOSIS — I4819 Other persistent atrial fibrillation: Secondary | ICD-10-CM | POA: Diagnosis not present

## 2021-08-17 DIAGNOSIS — I5021 Acute systolic (congestive) heart failure: Secondary | ICD-10-CM | POA: Diagnosis not present

## 2021-08-17 DIAGNOSIS — I25810 Atherosclerosis of coronary artery bypass graft(s) without angina pectoris: Secondary | ICD-10-CM

## 2021-08-17 LAB — BASIC METABOLIC PANEL
BUN/Creatinine Ratio: 14 (ref 10–24)
BUN: 18 mg/dL (ref 8–27)
CO2: 24 mmol/L (ref 20–29)
Calcium: 9.4 mg/dL (ref 8.6–10.2)
Chloride: 101 mmol/L (ref 96–106)
Creatinine, Ser: 1.28 mg/dL — ABNORMAL HIGH (ref 0.76–1.27)
Glucose: 108 mg/dL — ABNORMAL HIGH (ref 65–99)
Potassium: 4.7 mmol/L (ref 3.5–5.2)
Sodium: 137 mmol/L (ref 134–144)
eGFR: 58 mL/min/{1.73_m2} — ABNORMAL LOW (ref >59)

## 2021-08-17 MED ORDER — CARVEDILOL 6.25 MG PO TABS: 6.2500 mg | ORAL_TABLET | Freq: Two times a day (BID) | ORAL | 3 refills | Status: DC

## 2021-08-17 NOTE — Progress Notes (Signed)
Cardiology Office Note:    Date:  08/19/2021   ID:  Gregory Shepherd, Gregory Shepherd 1946/09/08, MRN [id number]  PCP:  Kirk Ruths, MD   [facility] Providers Cardiologist:  Quay Burow, MD     Referring MD: Kirk Ruths, MD   Chief Complaint  Patient presents with   Follow-up    History of Present Illness:    Gregory Shepherd is a 75 y.o. male with a hx of hypertension, hyperlipidemia, tobacco abuse, CAD s/p CABG( x2 at [facility] by Dr. Eliberto Ivory, redo x3 at [facility]) and PAD.  He had a remote MI at age 37.  He was referred to Dr. Gwenlyn Found for evaluation of claudication symptoms.  Lower extremity Doppler performed in March 2020 revealed right ABI 0.6, left ABI 0.8.  He underwent lower extremity angiography in June 2020 revealing 99% calcified ostial to proximal right common iliac artery stenosis, 60 to 70% left common iliac artery stenosis.  He underwent diamondback orbital rotational atherectomy, PTA and stenting with excellent result.  2D echo performed in March 2022 revealed a normal EF, grade 2 DD, normal valve function.  More recently, patient presented to the hospital in July 2022 with shortness of breath.  The symptom has been worsening laying flat for 2 weeks.  He also endorsed some chest burning sensation.  BNP was 430.  His symptom was attributed to acute on chronic diastolic heart failure and was treated with IV Lasix.  Elevated troponins (22 --> 36) during the hospitalization was attributed to demand ischemia.  Since then discharged, his Lasix has been switched to torsemide 20 mg tablet.  He was taking 40 mg daily of torsemide and a 10 mill equivalent twice a day of potassium chloride.  When I saw the patient on 08/12/2021, he had 2-3+ pitting edema in bilateral lower extremity and a bilateral crackles on physical exam.  He was clearly volume overloaded.  EKG obtained in the office showed atrial fibrillation.  Looking back, he was already in new atrial fibrillation  during hospitalization in July that was mistakenly interpreted as undetermined rhythm.  I discussed the case with DOD Dr. Margaretann Loveless, we stopped his Plavix and switched him to 5 mg twice a day of Eliquis.  We will I also discussed with the patient that he should be admitted to the hospital to undergo IV diuresis and potentially consider TEE DCCV, however he wished to do a trial of outpatient management first.  I increase his torsemide to 40 mg twice a day and increase his potassium to 20 meq twice a day dosing.  He called [place] on Friday says outpatient oral management has not been working for him and he is still very short of breath.  I added him on for today to review and potentially consider direct admission.  Patient presents today for follow-up.  He actually had a very good diuresis.  He lost about 7 pounds since last week.  Lower extremity edema has significantly improved.  Pulmonary edema also resolved based on physical exam as well.  At this time, I do not think the patient need direct admission anymore.  I will talk to Dr. Alvester Chou, I am still in favor of TEE DCCV as the patient is still symptomatic with shortness of breath and the fatigue.  However I think his current shortness of breath is due to combination of weakened heart and also persistent A. fib.  I will discuss with Dr. Gwenlyn Found to potentially switch his atenolol to carvedilol.  Addendum: I discussed with Dr. Gwenlyn Found and Gregory Shepherd, will switch atenolol to carvedilol 6.25 mg twice a day.  We will arrange for outpatient TEE DCCV.  I will see the patient back in 2 to 3 weeks for reassessment.  Past Medical History:  Diagnosis Date   Carotid artery narrowing    History of blood clots    History of colon polyps    Hyperlipidemia    Hypertension    MI (myocardial infarction) [facility])     Past Surgical History:  Procedure Laterality Date   ABDOMINAL AORTOGRAM W/LOWER EXTREMITY N/A 06/04/2019   Procedure: ABDOMINAL AORTOGRAM W/LOWER EXTREMITY;   Surgeon: Lorretta Harp, MD;  Location: [place] [facility];  Service: Cardiovascular;  Laterality: N/A;   CARDIAC SURGERY     x2    COLONOSCOPY     Age 36. [facility]   CORONARY ARTERY BYPASS GRAFT  1990   x 2 at [facility], redo x 3 @ [place] approx 1998   PERIPHERAL VASCULAR ATHERECTOMY Right 06/04/2019   Procedure: PERIPHERAL VASCULAR ATHERECTOMY;  Surgeon: Lorretta Harp, MD;  Location: [place] [facility];  Service: Cardiovascular;  Laterality: Right;  common iliac   PERIPHERAL VASCULAR INTERVENTION Right 06/04/2019   Procedure: PERIPHERAL VASCULAR INTERVENTION;  Surgeon: Lorretta Harp, MD;  Location: [place] [facility];  Service: Cardiovascular;  Laterality: Right;  common iliac    Current Medications: Current Meds  Medication Sig   apixaban (ELIQUIS) 5 MG TABS tablet Take 1 tablet (5 mg total) by mouth 2 (two) times daily.   aspirin 81 MG chewable tablet Chew by mouth daily.   atorvastatin (LIPITOR) 80 MG tablet Take 80 mg by mouth daily.   BREO ELLIPTA 100-25 MCG/INH AEPB Inhale 1 puff into the lungs daily.   cholecalciferol (VITAMIN D3) 25 MCG (1000 UT) tablet Take 1,000 Units by mouth daily.   finasteride (PROSCAR) 5 MG tablet Take 1 tablet (5 mg total) by mouth daily.   gabapentin (NEURONTIN) 300 MG capsule Take 1 capsule by mouth in the morning, at noon, in the evening, and at bedtime.   isosorbide mononitrate (IMDUR) 60 MG 24 hr tablet Take 60 mg by mouth daily.   lansoprazole (PREVACID) 30 MG capsule Take 1 capsule (30 mg total) by mouth 2 (two) times daily before a meal. (Patient taking differently: Take 30 mg by mouth daily before supper. 30 minutes before)   Multiple Vitamin (MULTIVITAMIN) tablet Take 1 tablet by mouth daily.   nitroGLYCERIN (NITROSTAT) 0.4 MG SL tablet as needed.   potassium chloride (KLOR-CON) 10 MEQ tablet Take 2 tablets (20 mEq total) by mouth 2 (two) times daily.   torsemide (DEMADEX) 20 MG tablet Take 2 tablets (40 mg total) by  mouth 2 (two) times daily.   traZODone (DESYREL) 50 MG tablet 0.5 tablets 2 (two) times daily.   vitamin B-12 (CYANOCOBALAMIN) 1000 MCG tablet Take 1,000 mcg by mouth daily.   vitamin C (ASCORBIC ACID) 250 MG tablet Take 250 mg by mouth daily.   [DISCONTINUED] atenolol (TENORMIN) 25 MG tablet Take 25 mg by mouth daily.     Allergies:   Omeprazole and Other   Social History   Socioeconomic History   Marital status: Married    Spouse name: Not on file   Number of children: Not on file   Years of education: Not on file   Highest education level: Not on file  Occupational History   Not on file  Tobacco Use  Smoking status: Every Day    Types: Cigarettes   Smokeless tobacco: Never   Tobacco comments:    8-9 cigarettes per day  Vaping Use   Vaping Use: Never used  Substance and Sexual Activity   Alcohol use: No   Drug use: No   Sexual activity: Not on file  Other Topics Concern   Not on file  Social History Narrative   Not on file   Social Determinants of Health   Financial Resource Strain: Not on file  Food Insecurity: Not on file  Transportation Needs: Not on file  Physical Activity: Not on file  Stress: Not on file  Social Connections: Not on file     Family History: The patient's family history includes Diabetes in his sister; Heart disease in his father and mother. There is no history of Prostate cancer, Bladder Cancer, Kidney cancer, Colon cancer, or Esophageal cancer.  ROS:   Please see the history of present illness.     All other systems reviewed and are negative.  EKGs/Labs/Other Studies Reviewed:    The following studies were reviewed today:  Echo 08/13/2021 1. Left ventricular ejection fraction, by estimation, is 35 to 40%. The  left ventricle has moderately decreased function. The left ventricle  demonstrates global hypokinesis. There is mild asymmetric left ventricular  hypertrophy of the basal-septal  segment. Left ventricular diastolic function  could not be evaluated.   2. Right ventricular systolic function is moderately reduced. The right  ventricular size is normal. Tricuspid regurgitation signal is inadequate  for assessing PA pressure.   3. Left atrial size was moderately dilated.   4. Moderate pleural effusion in the left lateral region.   5. The mitral valve is degenerative. Mild mitral valve regurgitation. No  evidence of mitral stenosis.   6. The aortic valve is tricuspid. There is moderate calcification of the  aortic valve. There is mild thickening of the aortic valve. Aortic valve  regurgitation is mild. Mild to moderate aortic valve  sclerosis/calcification is present, without any  evidence of aortic stenosis.   7. The inferior vena cava is dilated in size with <50% respiratory  variability, suggesting right atrial pressure of 15 mmHg.   Comparison(s): Changes from prior study are noted. The left ventricular function is significantly worse. EF is now 35-40% compared with 50-55% on last echo.   EKG:  EKG is ordered today.  The ekg ordered today demonstrates atrial fibrillation  Recent Labs: 06/19/2021: B Natriuretic Peptide 430.2; TSH 2.699 06/20/2021: Hemoglobin 11.2; Platelets 193 08/17/2021: BUN 18; Creatinine, Ser 1.28; Potassium 4.7; Sodium 137  Recent Lipid Panel No results found for: CHOL, TRIG, HDL, CHOLHDL, VLDL, LDLCALC, LDLDIRECT   Risk Assessment/Calculations:    CHA2DS2-VASc Score = 5   This indicates a 7.2% annual risk of stroke. The patient's score is based upon: CHF History: 1 HTN History: 1 Diabetes History: 0 Stroke History: 0 Vascular Disease History: 1 Age Score: 2 Gender Score: 0          Physical Exam:    VS:  BP 118/66   Pulse 71   Ht 6' 1.5" (1.867 m)   Wt 188 lb 6.4 oz (85.5 kg)   SpO2 96%   BMI 24.52 kg/m     Wt Readings from Last 3 Encounters:  08/17/21 188 lb 6.4 oz (85.5 kg)  08/12/21 195 lb 3.2 oz (88.5 kg)  06/19/21 195 lb (88.5 kg)     GEN:  Well  nourished, well developed in no acute  distress HEENT: Normal NECK: No JVD; No carotid bruits LYMPHATICS: No lymphadenopathy CARDIAC: Irregularly irregular, no murmurs, rubs, gallops RESPIRATORY:  Clear to auscultation without rales, wheezing or rhonchi  ABDOMEN: Soft, non-tender, non-distended MUSCULOSKELETAL:  No edema; No deformity  SKIN: Warm and dry NEUROLOGIC:  Alert and oriented x 3 PSYCHIATRIC:  Normal affect   ASSESSMENT:    1. Persistent atrial fibrillation ([employer])   2. Medication management   3. Acute systolic heart failure ([place])   4. Essential (primary) hypertension   5. Hyperlipidemia LDL goal <70   6. Coronary artery disease involving coronary bypass graft of native heart without angina pectoris    PLAN:    In order of problems listed above:  Persistent atrial fibrillation: Heart rate is reasonably controlled.  Given the new LV dysfunction, will switch atenolol to carvedilol 6.25 mg twice a day.  I discussed the case with Dr. Gwenlyn Found, we will proceed with TEE cardioversion given the fact that patient is very symptomatic.  Continue on Eliquis  Acute systolic heart failure: Echocardiogram obtained last week showed EF dropped down to 35%.  This is significantly changed when compared to the previous echo in March.  Likely related to new onset of atrial fibrillation.  I increased his diuretic last week, he had a significant urine output and a 7 pound weight loss.  His lung is clear now and lower extremity edema is largely resolved.  O2 saturations now in the upper 90s.  We will continue on the current therapy.  Obtain basic metabolic panel.  Hypertension: Blood pressure stable on current therapy.  Switch atenolol to carvedilol given LV dysfunction  Hyperlipidemia: Continue Lipitor  CAD s/p CABG: Denies any chest pain recently.  I stopped his Plavix last week given the need for Eliquis and the left him on aspirin.   Shared Decision Making/Informed Consent The risks [stroke,  cardiac arrhythmias rarely resulting in the need for a temporary or permanent pacemaker, skin irritation or burns, esophageal damage, perforation (1:10,000 risk), bleeding, pharyngeal hematoma as well as other potential complications associated with conscious sedation including aspiration, arrhythmia, respiratory failure and death], benefits (treatment guidance, restoration of normal sinus rhythm, diagnostic support) and alternatives of a transesophageal echocardiogram guided cardioversion were discussed in detail with Gregory Shepherd and he is willing to proceed.    Medication Adjustments/Labs and Tests Ordered: Current medicines are reviewed at length with the patient today.  Concerns regarding medicines are outlined above.  Orders Placed This Encounter  Procedures   Basic metabolic panel   EKG [id number]   No orders of the defined types were placed in this encounter.   Patient Instructions  Medication Instructions:  Your physician recommends that you continue on your current medications as directed. Please refer to the Current Medication list given to you today.  *If you need a refill on your cardiac medications before your next appointment, please call your pharmacy*  Lab Work: Your physician recommends that you return for lab work TODAY:  BMET Your physician recommends that you return for lab work tomorrow 08/18/21 or at your earliest convenience prior to [place]:  CBC If you have labs (blood work) drawn today and your tests are completely normal, you will receive your results only by: [employer] (if you have MyChart) OR A paper copy in the mail If you have any lab test that is abnormal or we need to change your treatment, we will call you to review the results.  Testing/Procedures: Your physician has recommended that you have  a Cardioversion (DCCV). Electrical Cardioversion uses a jolt of electricity to your heart either through paddles or wired patches attached to your  chest. This is a controlled, usually prescheduled, procedure. Defibrillation is done under light anesthesia in the hospital, and you usually go home the day of the procedure. This is done to get your heart back into a normal rhythm. You are not awake for the procedure. Please see the instruction sheet given to you today.   Follow-Up: At [facility], you and your health needs are our priority.  As part of our continuing mission to provide you with exceptional heart care, we have created designated Provider Care Teams.  These Care Teams include your primary Cardiologist (physician) and Advanced Practice Providers (APPs -  Physician Assistants and Nurse Practitioners) who all work together to provide you with the care you need, when you need it.  Your next appointment:   2-3 weeks after cardioversion   The format for your next appointment:   In Person  Provider:   Quay Burow, MD or Almyra Deforest, PA-C  Other Instructions       Dear Stephan Minister,  You are scheduled for a TEE/Cardioversion/TEE Cardioversion on Tuesday 08/25/21 with [place].  Please arrive at the [facility] (Main Entrance A) at [facility]: [address] [place], [place] [address] at 11:30 AM. (1 hour prior to procedure unless lab work is needed; if lab work is needed arrive 1.5 hours ahead)  DIET: Nothing to eat or drink after midnight except a sip of water with medications (see medication instructions below)  FYI: For your safety, and to allow [place] to monitor your vital signs accurately during the surgery/procedure we request that   if you have artificial nails, gel coating, SNS etc. Please have those removed prior to your surgery/procedure. Not having the nail coverings /polish removed may result in cancellation or delay of your surgery/procedure.   Medication Instructions: Hold Torsemide and Potassium the morning of TEE/Cardioversion  Continue your anticoagulant: Eliquis (Apixaban) You will  need to continue your anticoagulant after your procedure until you  are told by your  Provider that it is safe to stop   Labs: If patient is on Coumadin, patient needs pt/INR, CBC, BMET within 3 days (No pt/INR needed for patients taking Xarelto, Eliquis, Pradaxa) For patients receiving anesthesia for TEE and all Cardioversion patients: BMET, CBC within 1 week  Come to: [address] between the hours of 8:00 am and 4:30 pm. You do not have to be fasting.  You must have a responsible person to drive you home and stay in the waiting area during your procedure. Failure to do so could result in cancellation.  Bring your insurance cards.  *Special Note: Every effort is made to have your procedure done on time. Occasionally there are emergencies that occur at the hospital that may cause delays. Please be patient if a delay does occur.    Hilbert Corrigan, [place]  08/19/2021 4:27 PM    [facility]

## 2021-08-17 NOTE — H&P (View-Only) (Signed)
Cardiology Office Note:    Date:  08/19/2021   ID:  Gregory Shepherd, Gregory Shepherd 08-02-1946, MRN [id number]  PCP:  Kirk Ruths, MD   [facility] Providers Cardiologist:  Quay Burow, MD     Referring MD: Kirk Ruths, MD   Chief Complaint  Patient presents with   Follow-up    History of Present Illness:    Gregory Shepherd is a 75 y.o. male with a hx of hypertension, hyperlipidemia, tobacco abuse, CAD s/p CABG( x2 at [facility] by Dr. Eliberto Ivory, redo x3 at [facility]) and PAD.  He had a remote MI at age 41.  He was referred to Dr. Gwenlyn Found for evaluation of claudication symptoms.  Lower extremity Doppler performed in March 2020 revealed right ABI 0.6, left ABI 0.8.  He underwent lower extremity angiography in June 2020 revealing 99% calcified ostial to proximal right common iliac artery stenosis, 60 to 70% left common iliac artery stenosis.  He underwent diamondback orbital rotational atherectomy, PTA and stenting with excellent result.  2D echo performed in March 2022 revealed a normal EF, grade 2 DD, normal valve function.  More recently, patient presented to the hospital in July 2022 with shortness of breath.  The symptom has been worsening laying flat for 2 weeks.  He also endorsed some chest burning sensation.  BNP was 430.  His symptom was attributed to acute on chronic diastolic heart failure and was treated with IV Lasix.  Elevated troponins (22 --> 36) during the hospitalization was attributed to demand ischemia.  Since then discharged, his Lasix has been switched to torsemide 20 mg tablet.  He was taking 40 mg daily of torsemide and a 10 mill equivalent twice a day of potassium chloride.  When I saw the patient on 08/12/2021, he had 2-3+ pitting edema in bilateral lower extremity and a bilateral crackles on physical exam.  He was clearly volume overloaded.  EKG obtained in the office showed atrial fibrillation.  Looking back, he was already in new atrial fibrillation  during hospitalization in July that was mistakenly interpreted as undetermined rhythm.  I discussed the case with DOD Dr. Margaretann Loveless, we stopped his Plavix and switched him to 5 mg twice a day of Eliquis.  We will I also discussed with the patient that he should be admitted to the hospital to undergo IV diuresis and potentially consider TEE DCCV, however he wished to do a trial of outpatient management first.  I increase his torsemide to 40 mg twice a day and increase his potassium to 20 meq twice a day dosing.  He called [place] on Friday says outpatient oral management has not been working for him and he is still very short of breath.  I added him on for today to review and potentially consider direct admission.  Patient presents today for follow-up.  He actually had a very good diuresis.  He lost about 7 pounds since last week.  Lower extremity edema has significantly improved.  Pulmonary edema also resolved based on physical exam as well.  At this time, I do not think the patient need direct admission anymore.  I will talk to Dr. Alvester Chou, I am still in favor of TEE DCCV as the patient is still symptomatic with shortness of breath and the fatigue.  However I think his current shortness of breath is due to combination of weakened heart and also persistent A. fib.  I will discuss with Dr. Gwenlyn Found to potentially switch his atenolol to carvedilol.  Addendum: I discussed with Dr. Gwenlyn Found and Mr. Curiale, will switch atenolol to carvedilol 6.25 mg twice a day.  We will arrange for outpatient TEE DCCV.  I will see the patient back in 2 to 3 weeks for reassessment.  Past Medical History:  Diagnosis Date   Carotid artery narrowing    History of blood clots    History of colon polyps    Hyperlipidemia    Hypertension    MI (myocardial infarction) [facility])     Past Surgical History:  Procedure Laterality Date   ABDOMINAL AORTOGRAM W/LOWER EXTREMITY N/A 06/04/2019   Procedure: ABDOMINAL AORTOGRAM W/LOWER EXTREMITY;   Surgeon: Lorretta Harp, MD;  Location: [place] [facility];  Service: Cardiovascular;  Laterality: N/A;   CARDIAC SURGERY     x2    COLONOSCOPY     Age 52. [facility]   CORONARY ARTERY BYPASS GRAFT  1990   x 2 at [facility], redo x 3 @ [place] approx 1998   PERIPHERAL VASCULAR ATHERECTOMY Right 06/04/2019   Procedure: PERIPHERAL VASCULAR ATHERECTOMY;  Surgeon: Lorretta Harp, MD;  Location: [place] [facility];  Service: Cardiovascular;  Laterality: Right;  common iliac   PERIPHERAL VASCULAR INTERVENTION Right 06/04/2019   Procedure: PERIPHERAL VASCULAR INTERVENTION;  Surgeon: Lorretta Harp, MD;  Location: [place] [facility];  Service: Cardiovascular;  Laterality: Right;  common iliac    Current Medications: Current Meds  Medication Sig   apixaban (ELIQUIS) 5 MG TABS tablet Take 1 tablet (5 mg total) by mouth 2 (two) times daily.   aspirin 81 MG chewable tablet Chew by mouth daily.   atorvastatin (LIPITOR) 80 MG tablet Take 80 mg by mouth daily.   BREO ELLIPTA 100-25 MCG/INH AEPB Inhale 1 puff into the lungs daily.   cholecalciferol (VITAMIN D3) 25 MCG (1000 UT) tablet Take 1,000 Units by mouth daily.   finasteride (PROSCAR) 5 MG tablet Take 1 tablet (5 mg total) by mouth daily.   gabapentin (NEURONTIN) 300 MG capsule Take 1 capsule by mouth in the morning, at noon, in the evening, and at bedtime.   isosorbide mononitrate (IMDUR) 60 MG 24 hr tablet Take 60 mg by mouth daily.   lansoprazole (PREVACID) 30 MG capsule Take 1 capsule (30 mg total) by mouth 2 (two) times daily before a meal. (Patient taking differently: Take 30 mg by mouth daily before supper. 30 minutes before)   Multiple Vitamin (MULTIVITAMIN) tablet Take 1 tablet by mouth daily.   nitroGLYCERIN (NITROSTAT) 0.4 MG SL tablet as needed.   potassium chloride (KLOR-CON) 10 MEQ tablet Take 2 tablets (20 mEq total) by mouth 2 (two) times daily.   torsemide (DEMADEX) 20 MG tablet Take 2 tablets (40 mg total) by  mouth 2 (two) times daily.   traZODone (DESYREL) 50 MG tablet 0.5 tablets 2 (two) times daily.   vitamin B-12 (CYANOCOBALAMIN) 1000 MCG tablet Take 1,000 mcg by mouth daily.   vitamin C (ASCORBIC ACID) 250 MG tablet Take 250 mg by mouth daily.   [DISCONTINUED] atenolol (TENORMIN) 25 MG tablet Take 25 mg by mouth daily.     Allergies:   Omeprazole and Other   Social History   Socioeconomic History   Marital status: Married    Spouse name: Not on file   Number of children: Not on file   Years of education: Not on file   Highest education level: Not on file  Occupational History   Not on file  Tobacco Use  Smoking status: Every Day    Types: Cigarettes   Smokeless tobacco: Never   Tobacco comments:    8-9 cigarettes per day  Vaping Use   Vaping Use: Never used  Substance and Sexual Activity   Alcohol use: No   Drug use: No   Sexual activity: Not on file  Other Topics Concern   Not on file  Social History Narrative   Not on file   Social Determinants of Health   Financial Resource Strain: Not on file  Food Insecurity: Not on file  Transportation Needs: Not on file  Physical Activity: Not on file  Stress: Not on file  Social Connections: Not on file     Family History: The patient's family history includes Diabetes in his sister; Heart disease in his father and mother. There is no history of Prostate cancer, Bladder Cancer, Kidney cancer, Colon cancer, or Esophageal cancer.  ROS:   Please see the history of present illness.     All other systems reviewed and are negative.  EKGs/Labs/Other Studies Reviewed:    The following studies were reviewed today:  Echo 08/13/2021 1. Left ventricular ejection fraction, by estimation, is 35 to 40%. The  left ventricle has moderately decreased function. The left ventricle  demonstrates global hypokinesis. There is mild asymmetric left ventricular  hypertrophy of the basal-septal  segment. Left ventricular diastolic function  could not be evaluated.   2. Right ventricular systolic function is moderately reduced. The right  ventricular size is normal. Tricuspid regurgitation signal is inadequate  for assessing PA pressure.   3. Left atrial size was moderately dilated.   4. Moderate pleural effusion in the left lateral region.   5. The mitral valve is degenerative. Mild mitral valve regurgitation. No  evidence of mitral stenosis.   6. The aortic valve is tricuspid. There is moderate calcification of the  aortic valve. There is mild thickening of the aortic valve. Aortic valve  regurgitation is mild. Mild to moderate aortic valve  sclerosis/calcification is present, without any  evidence of aortic stenosis.   7. The inferior vena cava is dilated in size with <50% respiratory  variability, suggesting right atrial pressure of 15 mmHg.   Comparison(s): Changes from prior study are noted. The left ventricular function is significantly worse. EF is now 35-40% compared with 50-55% on last echo.   EKG:  EKG is ordered today.  The ekg ordered today demonstrates atrial fibrillation  Recent Labs: 06/19/2021: B Natriuretic Peptide 430.2; TSH 2.699 06/20/2021: Hemoglobin 11.2; Platelets 193 08/17/2021: BUN 18; Creatinine, Ser 1.28; Potassium 4.7; Sodium 137  Recent Lipid Panel No results found for: CHOL, TRIG, HDL, CHOLHDL, VLDL, LDLCALC, LDLDIRECT   Risk Assessment/Calculations:    CHA2DS2-VASc Score = 5   This indicates a 7.2% annual risk of stroke. The patient's score is based upon: CHF History: 1 HTN History: 1 Diabetes History: 0 Stroke History: 0 Vascular Disease History: 1 Age Score: 2 Gender Score: 0          Physical Exam:    VS:  BP 118/66   Pulse 71   Ht 6' 1.5" (1.867 m)   Wt 188 lb 6.4 oz (85.5 kg)   SpO2 96%   BMI 24.52 kg/m     Wt Readings from Last 3 Encounters:  08/17/21 188 lb 6.4 oz (85.5 kg)  08/12/21 195 lb 3.2 oz (88.5 kg)  06/19/21 195 lb (88.5 kg)     GEN:  Well  nourished, well developed in no acute  distress HEENT: Normal NECK: No JVD; No carotid bruits LYMPHATICS: No lymphadenopathy CARDIAC: Irregularly irregular, no murmurs, rubs, gallops RESPIRATORY:  Clear to auscultation without rales, wheezing or rhonchi  ABDOMEN: Soft, non-tender, non-distended MUSCULOSKELETAL:  No edema; No deformity  SKIN: Warm and dry NEUROLOGIC:  Alert and oriented x 3 PSYCHIATRIC:  Normal affect   ASSESSMENT:    1. Persistent atrial fibrillation ([place])   2. Medication management   3. Acute systolic heart failure ([place])   4. Essential (primary) hypertension   5. Hyperlipidemia LDL goal <70   6. Coronary artery disease involving coronary bypass graft of native heart without angina pectoris    PLAN:    In order of problems listed above:  Persistent atrial fibrillation: Heart rate is reasonably controlled.  Given the new LV dysfunction, will switch atenolol to carvedilol 6.25 mg twice a day.  I discussed the case with Dr. Gwenlyn Found, we will proceed with TEE cardioversion given the fact that patient is very symptomatic.  Continue on Eliquis  Acute systolic heart failure: Echocardiogram obtained last week showed EF dropped down to 35%.  This is significantly changed when compared to the previous echo in March.  Likely related to new onset of atrial fibrillation.  I increased his diuretic last week, he had a significant urine output and a 7 pound weight loss.  His lung is clear now and lower extremity edema is largely resolved.  O2 saturations now in the upper 90s.  We will continue on the current therapy.  Obtain basic metabolic panel.  Hypertension: Blood pressure stable on current therapy.  Switch atenolol to carvedilol given LV dysfunction  Hyperlipidemia: Continue Lipitor  CAD s/p CABG: Denies any chest pain recently.  I stopped his Plavix last week given the need for Eliquis and the left him on aspirin.   Shared Decision Making/Informed Consent The risks [stroke,  cardiac arrhythmias rarely resulting in the need for a temporary or permanent pacemaker, skin irritation or burns, esophageal damage, perforation (1:10,000 risk), bleeding, pharyngeal hematoma as well as other potential complications associated with conscious sedation including aspiration, arrhythmia, respiratory failure and death], benefits (treatment guidance, restoration of normal sinus rhythm, diagnostic support) and alternatives of a transesophageal echocardiogram guided cardioversion were discussed in detail with Mr. Cord and he is willing to proceed.    Medication Adjustments/Labs and Tests Ordered: Current medicines are reviewed at length with the patient today.  Concerns regarding medicines are outlined above.  Orders Placed This Encounter  Procedures   Basic metabolic panel   EKG [id number]   No orders of the defined types were placed in this encounter.   Patient Instructions  Medication Instructions:  Your physician recommends that you continue on your current medications as directed. Please refer to the Current Medication list given to you today.  *If you need a refill on your cardiac medications before your next appointment, please call your pharmacy*  Lab Work: Your physician recommends that you return for lab work TODAY:  BMET Your physician recommends that you return for lab work tomorrow 08/18/21 or at your earliest convenience prior to [place]:  CBC If you have labs (blood work) drawn today and your tests are completely normal, you will receive your results only by: [employer] (if you have MyChart) OR A paper copy in the mail If you have any lab test that is abnormal or we need to change your treatment, we will call you to review the results.  Testing/Procedures: Your physician has recommended that you have  a Cardioversion (DCCV). Electrical Cardioversion uses a jolt of electricity to your heart either through paddles or wired patches attached to your  chest. This is a controlled, usually prescheduled, procedure. Defibrillation is done under light anesthesia in the hospital, and you usually go home the day of the procedure. This is done to get your heart back into a normal rhythm. You are not awake for the procedure. Please see the instruction sheet given to you today.   Follow-Up: At [facility], you and your health needs are our priority.  As part of our continuing mission to provide you with exceptional heart care, we have created designated Provider Care Teams.  These Care Teams include your primary Cardiologist (physician) and Advanced Practice Providers (APPs -  Physician Assistants and Nurse Practitioners) who all work together to provide you with the care you need, when you need it.  Your next appointment:   2-3 weeks after cardioversion   The format for your next appointment:   In Person  Provider:   Quay Burow, MD or Almyra Deforest, PA-C  Other Instructions       Dear Stephan Minister,  You are scheduled for a TEE/Cardioversion/TEE Cardioversion on Tuesday 08/25/21 with [place].  Please arrive at the [facility] (Main Entrance A) at [facility]: [address] [place], [place] [address] at 11:30 AM. (1 hour prior to procedure unless lab work is needed; if lab work is needed arrive 1.5 hours ahead)  DIET: Nothing to eat or drink after midnight except a sip of water with medications (see medication instructions below)  FYI: For your safety, and to allow [place] to monitor your vital signs accurately during the surgery/procedure we request that   if you have artificial nails, gel coating, SNS etc. Please have those removed prior to your surgery/procedure. Not having the nail coverings /polish removed may result in cancellation or delay of your surgery/procedure.   Medication Instructions: Hold Torsemide and Potassium the morning of TEE/Cardioversion  Continue your anticoagulant: Eliquis (Apixaban) You will  need to continue your anticoagulant after your procedure until you  are told by your  Provider that it is safe to stop   Labs: If patient is on Coumadin, patient needs pt/INR, CBC, BMET within 3 days (No pt/INR needed for patients taking Xarelto, Eliquis, Pradaxa) For patients receiving anesthesia for TEE and all Cardioversion patients: BMET, CBC within 1 week  Come to: [address] between the hours of 8:00 am and 4:30 pm. You do not have to be fasting.  You must have a responsible person to drive you home and stay in the waiting area during your procedure. Failure to do so could result in cancellation.  Bring your insurance cards.  *Special Note: Every effort is made to have your procedure done on time. Occasionally there are emergencies that occur at the hospital that may cause delays. Please be patient if a delay does occur.    Hilbert Corrigan, [place]  08/19/2021 4:27 PM    [facility]

## 2021-08-17 NOTE — Patient Instructions (Addendum)
Medication Instructions:  Your physician recommends that you continue on your current medications as directed. Please refer to the Current Medication list given to you today.  *If you need a refill on your cardiac medications before your next appointment, please call your pharmacy*  Lab Work: Your physician recommends that you return for lab work TODAY:  BMET Your physician recommends that you return for lab work tomorrow 08/18/21 or at your earliest convenience prior to [place]:  CBC If you have labs (blood work) drawn today and your tests are completely normal, you will receive your results only by: [employer] (if you have MyChart) OR A paper copy in the mail If you have any lab test that is abnormal or we need to change your treatment, we will call you to review the results.  Testing/Procedures: Your physician has recommended that you have a Cardioversion (DCCV). Electrical Cardioversion uses a jolt of electricity to your heart either through paddles or wired patches attached to your chest. This is a controlled, usually prescheduled, procedure. Defibrillation is done under light anesthesia in the hospital, and you usually go home the day of the procedure. This is done to get your heart back into a normal rhythm. You are not awake for the procedure. Please see the instruction sheet given to you today.   Follow-Up: At [facility], you and your health needs are our priority.  As part of our continuing mission to provide you with exceptional heart care, we have created designated Provider Care Teams.  These Care Teams include your primary Cardiologist (physician) and Advanced Practice Providers (APPs -  Physician Assistants and Nurse Practitioners) who all work together to provide you with the care you need, when you need it.  Your next appointment:   2-3 weeks after cardioversion   The format for your next appointment:   In Person  Provider:   Quay Burow, MD or Almyra Deforest, PA-C  Other Instructions       Dear Gregory Shepherd,  You are scheduled for a TEE/Cardioversion/TEE Cardioversion on Tuesday 08/25/21 with [place].  Please arrive at the [facility] (Main Entrance A) at [facility]: [address] [place],  [address] at 11:30 AM. (1 hour prior to procedure unless lab work is needed; if lab work is needed arrive 1.5 hours ahead)  DIET: Nothing to eat or drink after midnight except a sip of water with medications (see medication instructions below)  FYI: For your safety, and to allow [place] to monitor your vital signs accurately during the surgery/procedure we request that   if you have artificial nails, gel coating, SNS etc. Please have those removed prior to your surgery/procedure. Not having the nail coverings /polish removed may result in cancellation or delay of your surgery/procedure.   Medication Instructions: Hold Torsemide and Potassium the morning of TEE/Cardioversion  Continue your anticoagulant: Eliquis (Apixaban) You will need to continue your anticoagulant after your procedure until you  are told by your  Provider that it is safe to stop   Labs: If patient is on Coumadin, patient needs pt/INR, CBC, BMET within 3 days (No pt/INR needed for patients taking Xarelto, Eliquis, Pradaxa) For patients receiving anesthesia for TEE and all Cardioversion patients: BMET, CBC within 1 week  Come to: [address] [address] between the hours of 8:00 am and 4:30 pm. You do not have to be fasting.  You must have a responsible person to drive you home and stay in the waiting area  during your procedure. Failure to do so could result in cancellation.  Bring your insurance cards.  *Special Note: Every effort is made to have your procedure done on time. Occasionally there are emergencies that occur at the hospital that may cause delays. Please be patient if a delay does occur.

## 2021-08-18 ENCOUNTER — Other Ambulatory Visit: Payer: Self-pay

## 2021-08-18 DIAGNOSIS — Z01818 Encounter for other preprocedural examination: Secondary | ICD-10-CM

## 2021-08-18 DIAGNOSIS — Z01812 Encounter for preprocedural laboratory examination: Secondary | ICD-10-CM

## 2021-08-18 NOTE — Progress Notes (Signed)
Stable renal function and electrolyte on the higher dose of diuretic.

## 2021-08-18 NOTE — Progress Notes (Signed)
c 

## 2021-08-19 ENCOUNTER — Telehealth: Payer: Self-pay | Admitting: Cardiovascular Disease

## 2021-08-19 ENCOUNTER — Other Ambulatory Visit: Payer: Self-pay | Admitting: Physician Assistant

## 2021-08-19 DIAGNOSIS — Z01818 Encounter for other preprocedural examination: Secondary | ICD-10-CM | POA: Diagnosis not present

## 2021-08-19 LAB — CBC
Hematocrit: 37.3 % — ABNORMAL LOW (ref 37.5–51.0)
Hemoglobin: 11.7 g/dL — ABNORMAL LOW (ref 13.0–17.7)
MCH: 25.1 pg — ABNORMAL LOW (ref 26.6–33.0)
MCHC: 31.4 g/dL — ABNORMAL LOW (ref 31.5–35.7)
MCV: 80 fL (ref 79–97)
Platelets: 233 10*3/uL (ref 150–450)
RBC: 4.67 x10E6/uL (ref 4.14–5.80)
RDW: 15.3 % (ref 11.6–15.4)
WBC: 6.1 10*3/uL (ref 3.4–10.8)

## 2021-08-19 NOTE — Telephone Encounter (Signed)
Advised wife that current cbc ordered was placed under [facility]. Wife verbalized understanding

## 2021-08-19 NOTE — Progress Notes (Signed)
Red blood cell count stable. Normal platelet level. No contraindication with TEE DCCV

## 2021-08-19 NOTE — Telephone Encounter (Signed)
Patient wife called in to ask that lab request be release to [facility] so that they can get the blood work done there. Please aadvise

## 2021-08-21 ENCOUNTER — Ambulatory Visit: Payer: Medicare Other | Admitting: Physician Assistant

## 2021-08-25 ENCOUNTER — Ambulatory Visit (HOSPITAL_COMMUNITY)
Admission: RE | Admit: 2021-08-25 | Discharge: 2021-08-25 | Disposition: A | Discharge status: Home / Self Care | Payer: Medicare Other | Attending: Cardiovascular Disease | Admitting: Cardiovascular Disease

## 2021-08-25 ENCOUNTER — Ambulatory Visit (HOSPITAL_COMMUNITY): Payer: Medicare Other | Admitting: Certified Registered Nurse Anesthetist

## 2021-08-25 ENCOUNTER — Ambulatory Visit (HOSPITAL_BASED_OUTPATIENT_CLINIC_OR_DEPARTMENT_OTHER)
Admission: RE | Admit: 2021-08-25 | Discharge: 2021-08-25 | Disposition: A | Discharge status: Home / Self Care | Payer: Medicare Other | Source: Ambulatory Visit | Attending: Physician Assistant | Admitting: Physician Assistant

## 2021-08-25 ENCOUNTER — Other Ambulatory Visit: Payer: Self-pay

## 2021-08-25 ENCOUNTER — Encounter (HOSPITAL_COMMUNITY)
Admission: RE | Disposition: A | Discharge status: Home / Self Care | Payer: Self-pay | Source: Home / Self Care | Attending: Cardiovascular Disease

## 2021-08-25 ENCOUNTER — Encounter (HOSPITAL_COMMUNITY): Payer: Self-pay | Admitting: Cardiovascular Disease

## 2021-08-25 DIAGNOSIS — I340 Nonrheumatic mitral (valve) insufficiency: Secondary | ICD-10-CM | POA: Diagnosis not present

## 2021-08-25 DIAGNOSIS — Z79899 Other long term (current) drug therapy: Secondary | ICD-10-CM | POA: Insufficient documentation

## 2021-08-25 DIAGNOSIS — E785 Hyperlipidemia, unspecified: Secondary | ICD-10-CM | POA: Diagnosis not present

## 2021-08-25 DIAGNOSIS — Z888 Allergy status to other drugs, medicaments and biological substances status: Secondary | ICD-10-CM | POA: Insufficient documentation

## 2021-08-25 DIAGNOSIS — I110 Hypertensive heart disease with heart failure: Secondary | ICD-10-CM | POA: Diagnosis not present

## 2021-08-25 DIAGNOSIS — I4819 Other persistent atrial fibrillation: Secondary | ICD-10-CM | POA: Insufficient documentation

## 2021-08-25 DIAGNOSIS — K219 Gastro-esophageal reflux disease without esophagitis: Secondary | ICD-10-CM | POA: Diagnosis not present

## 2021-08-25 DIAGNOSIS — Z8249 Family history of ischemic heart disease and other diseases of the circulatory system: Secondary | ICD-10-CM | POA: Diagnosis not present

## 2021-08-25 DIAGNOSIS — I700 Atherosclerosis of aorta: Secondary | ICD-10-CM | POA: Insufficient documentation

## 2021-08-25 DIAGNOSIS — I5043 Acute on chronic combined systolic (congestive) and diastolic (congestive) heart failure: Secondary | ICD-10-CM | POA: Diagnosis not present

## 2021-08-25 DIAGNOSIS — I4891 Unspecified atrial fibrillation: Secondary | ICD-10-CM | POA: Insufficient documentation

## 2021-08-25 DIAGNOSIS — Z7901 Long term (current) use of anticoagulants: Secondary | ICD-10-CM | POA: Insufficient documentation

## 2021-08-25 DIAGNOSIS — I25810 Atherosclerosis of coronary artery bypass graft(s) without angina pectoris: Secondary | ICD-10-CM | POA: Diagnosis not present

## 2021-08-25 DIAGNOSIS — I351 Nonrheumatic aortic (valve) insufficiency: Secondary | ICD-10-CM

## 2021-08-25 DIAGNOSIS — F17210 Nicotine dependence, cigarettes, uncomplicated: Secondary | ICD-10-CM | POA: Diagnosis not present

## 2021-08-25 DIAGNOSIS — I080 Rheumatic disorders of both mitral and aortic valves: Secondary | ICD-10-CM | POA: Insufficient documentation

## 2021-08-25 DIAGNOSIS — Z7982 Long term (current) use of aspirin: Secondary | ICD-10-CM | POA: Diagnosis not present

## 2021-08-25 DIAGNOSIS — I10 Essential (primary) hypertension: Secondary | ICD-10-CM | POA: Diagnosis not present

## 2021-08-25 HISTORY — PX: TEE WITHOUT CARDIOVERSION: SHX5443

## 2021-08-25 HISTORY — PX: CARDIOVERSION: SHX1299

## 2021-08-25 SURGERY — ECHOCARDIOGRAM, TRANSESOPHAGEAL
Anesthesia: General

## 2021-08-25 MED ORDER — TRAZODONE HCL 50 MG PO TABS: 25.0000 mg | ORAL_TABLET | Freq: Two times a day (BID) | ORAL | Status: DC

## 2021-08-25 MED ORDER — LIDOCAINE 2% (20 MG/ML) 5 ML SYRINGE
INTRAMUSCULAR | Status: DC | PRN
  Administered 2021-08-25: 100 mg via INTRAVENOUS

## 2021-08-25 MED ORDER — PROPOFOL 500 MG/50ML IV EMUL
INTRAVENOUS | Status: DC | PRN
  Administered 2021-08-25: 75 ug/kg/min via INTRAVENOUS

## 2021-08-25 MED ORDER — SODIUM CHLORIDE 0.9 % IV SOLN: INTRAVENOUS | Status: DC

## 2021-08-25 MED ORDER — PROPOFOL 10 MG/ML IV BOLUS
INTRAVENOUS | Status: DC | PRN
  Administered 2021-08-25 (×3): 20 mg via INTRAVENOUS

## 2021-08-25 NOTE — Anesthesia Procedure Notes (Signed)
Procedure Name: MAC Date/Time: 08/25/2021 12:49 PM Performed by: Dorthea Cove, CRNA Pre-anesthesia Checklist: Patient identified, Emergency Drugs available, Suction available, Patient being monitored and Timeout performed Patient Re-evaluated:Patient Re-evaluated prior to induction Oxygen Delivery Method: Nasal cannula Preoxygenation: Pre-oxygenation with 100% oxygen Induction Type: IV induction Placement Confirmation: positive ETCO2 and CO2 detector Dental Injury: Teeth and Oropharynx as per pre-operative assessment

## 2021-08-25 NOTE — Anesthesia Postprocedure Evaluation (Signed)
Anesthesia Post Note  Patient: Gregory Shepherd  Procedure(s) Performed: TRANSESOPHAGEAL ECHOCARDIOGRAM (TEE) CARDIOVERSION     Patient location during evaluation: Endoscopy Anesthesia Type: General Level of consciousness: awake Pain management: pain level controlled Vital Signs Assessment: post-procedure vital signs reviewed and stable Respiratory status: spontaneous breathing Cardiovascular status: stable Postop Assessment: no apparent nausea or vomiting Anesthetic complications: no   No notable events documented.  Last Vitals:  Vitals:   08/25/21 1329 08/25/21 1339  BP: 132/64 136/72  Pulse: 63 66  Resp: 19 20  Temp:    SpO2: 95% 96%    Last Pain:  Vitals:   08/25/21 1339  TempSrc:   PainSc: 0-No pain                 Jarika Robben

## 2021-08-25 NOTE — CV Procedure (Addendum)
Brief TEE Note  LVEF 20-25% Global hypokinesis Moderate right ventricular systolic dysfunction Aortic valve sclerosis without stenosis Mild aortic regurgitation Mild mitral regurgitation LA dilated No LA/LAA thrombus or masses  Electrical Cardioversion Procedure Note Gregory Shepherd [id number] 1945-12-25  Procedure: Electrical Cardioversion Indications:  Atrial Fibrillation  Procedure Details Consent: Risks of procedure as well as the alternatives and risks of each were explained to the (patient/caregiver).  Consent for procedure obtained. Time Out: Verified patient identification, verified procedure, site/side was marked, verified correct patient position, special equipment/implants available, medications/allergies/relevent history reviewed, required imaging and test results available.  Performed  Patient placed on cardiac monitor, pulse oximetry, supplemental oxygen as necessary.  Sedation given:  propofol Pacer pads placed anterior and posterior chest.  Cardioverted 3 time(s).  Cardioverted at 150J, 200J, 200J with pressure.  Each unsuccessful.  Evaluation Findings: Post procedure EKG shows: Atrial Fibrillation Complications: None Patient did tolerate procedure well.   Skeet Latch, MD 08/25/2021, 1:14 PM

## 2021-08-25 NOTE — Discharge Instructions (Signed)
TEE  YOU HAD AN CARDIAC PROCEDURE TODAY: Refer to the procedure report and other information in the discharge instructions given to you for any specific questions about what was found during the examination. If this information does not answer your questions, please call [facility] office at [phone] to clarify.   DIET: Your first meal following the procedure should be a light meal and then it is ok to progress to your normal diet. A half-sandwich or bowl of soup is an example of a good first meal. Heavy or fried foods are harder to digest and may make you feel nauseous or bloated. Drink plenty of fluids but you should avoid alcoholic beverages for 24 hours. If you had a esophageal dilation, please see attached instructions for diet.   ACTIVITY: Your care partner should take you home directly after the procedure. You should plan to take it easy, moving slowly for the rest of the day. You can resume normal activity the day after the procedure however YOU SHOULD NOT DRIVE, use power tools, machinery or perform tasks that involve climbing or major physical exertion for 24 hours (because of the sedation medicines used during the test).   SYMPTOMS TO REPORT IMMEDIATELY: A cardiologist can be reached at any hour. Please call [phone] for any of the following symptoms:  Vomiting of blood or coffee ground material  New, significant abdominal pain  New, significant chest pain or pain under the shoulder blades  Painful or persistently difficult swallowing  New shortness of breath  Black, tarry-looking or red, bloody stools  FOLLOW UP:  Please also call with any specific questions about appointments or follow up tests.  Electrical Cardioversion  Electrical cardioversion is the delivery of a jolt of electricity to restore a normal rhythm to the heart. A rhythm that is too fast or is not regular keeps the heart from pumping well. In this procedure, sticky patches or metal paddles are placed on  the chest to deliver electricity to the heart from a device.  If this information does not answer your questions, please call [place] office at [phone] to clarify.   Follow these instructions at home: You may have some redness on the skin where the shocks were given.  You may apply over-the-counter hydrocortisone cream or aloe vera to alleviate skin irritation. YOU SHOULD NOT DRIVE, use power tools, machinery or perform tasks that involve climbing or major physical exertion for 24 hours (because of the sedation medicines used during the test).  Take over-the-counter and prescription medicines only as told by your health care provider. Ask your health care provider how to check your pulse. Check it often. Rest for 48 hours after the procedure or as told by your health care provider. Avoid or limit your caffeine use as told by your health care provider. Keep all follow-up visits as told by your health care provider. This is important.  FOLLOW UP:  Please also call with any specific questions about appointments or follow up tests.

## 2021-08-25 NOTE — Anesthesia Preprocedure Evaluation (Signed)
Anesthesia Evaluation  Patient identified by MRN, date of birth, ID band Patient awake    Reviewed: Allergy & Precautions, NPO status , Patient's Chart, lab work & pertinent test results  Airway Mallampati: II  TM Distance: >3 FB     Dental   Pulmonary shortness of breath, Current Smoker and Patient abstained from smoking.,    breath sounds clear to auscultation       Cardiovascular hypertension, + Past MI and + Peripheral Vascular Disease   Rhythm:Regular Rate:Normal     Neuro/Psych    GI/Hepatic GERD  ,  Endo/Other  diabetes  Renal/GU      Musculoskeletal   Abdominal   Peds  Hematology   Anesthesia Other Findings   Reproductive/Obstetrics                             Anesthesia Physical Anesthesia Plan  ASA: 3  Anesthesia Plan: General   Post-op Pain Management:    Induction: Intravenous  PONV Risk Score and Plan: 1 and Propofol infusion and Treatment may vary due to age or medical condition  Airway Management Planned: Nasal Cannula and Simple Face Mask  Additional Equipment:   Intra-op Plan:   Post-operative Plan:   Informed Consent: I have reviewed the patients History and Physical, chart, labs and discussed the procedure including the risks, benefits and alternatives for the proposed anesthesia with the patient or authorized representative who has indicated his/her understanding and acceptance.     Dental advisory given  Plan Discussed with: CRNA, Anesthesiologist and Surgeon  Anesthesia Plan Comments:         Anesthesia Quick Evaluation

## 2021-08-25 NOTE — Interval H&P Note (Signed)
History and Physical Interval Note:  08/25/2021 12:38 PM  Gregory Shepherd  has presented today for surgery, with the diagnosis of AFIB.  The various methods of treatment have been discussed with the patient and family. After consideration of risks, benefits and other options for treatment, the patient has consented to  Procedure(s): TRANSESOPHAGEAL ECHOCARDIOGRAM (TEE) (N/A) CARDIOVERSION (N/A) as a surgical intervention.  The patient's history has been reviewed, patient examined, no change in status, stable for surgery.  I have reviewed the patient's chart and labs.  Questions were answered to the patient's satisfaction.     Skeet Latch, MD

## 2021-08-25 NOTE — Transfer of Care (Signed)
Immediate Anesthesia Transfer of Care Note  Patient: Gregory Shepherd  Procedure(s) Performed: TRANSESOPHAGEAL ECHOCARDIOGRAM (TEE) CARDIOVERSION  Patient Location: Endoscopy Unit  Anesthesia Type:General  Level of Consciousness: drowsy and patient cooperative  Airway & Oxygen Therapy: Patient Spontanous Breathing  Post-op Assessment: Report given to RN and Post -op Vital signs reviewed and stable  Post vital signs: Reviewed and stable  Last Vitals:  Vitals Value Taken Time  BP 122/56 08/25/21 1318  Temp    Pulse 67 08/25/21 1319  Resp 19 08/25/21 1319  SpO2 93 % 08/25/21 1319  Vitals shown include unvalidated device data.  Last Pain:  Vitals:   08/25/21 1155  TempSrc: Temporal  PainSc: 0-No pain         Complications: No notable events documented.

## 2021-08-25 NOTE — Progress Notes (Signed)
  Echocardiogram Echocardiogram Transesophageal has been performed.  Merrie Roof F 08/25/2021, 1:19 PM

## 2021-08-28 ENCOUNTER — Other Ambulatory Visit: Payer: Self-pay

## 2021-08-28 DIAGNOSIS — I4819 Other persistent atrial fibrillation: Secondary | ICD-10-CM

## 2021-08-28 NOTE — Addendum Note (Signed)
Addended by: Jacqulynn Cadet on: 08/28/2021 05:02 PM   Modules accepted: Orders

## 2021-09-08 ENCOUNTER — Encounter: Payer: Self-pay | Admitting: Physician Assistant

## 2021-09-08 ENCOUNTER — Ambulatory Visit: Payer: Medicare Other | Admitting: Physician Assistant

## 2021-09-08 ENCOUNTER — Other Ambulatory Visit: Payer: Self-pay

## 2021-09-08 VITALS — BP 120/60 | HR 70 | Ht 73.0 in | Wt 191.0 lb

## 2021-09-08 DIAGNOSIS — E785 Hyperlipidemia, unspecified: Secondary | ICD-10-CM

## 2021-09-08 DIAGNOSIS — I4819 Other persistent atrial fibrillation: Secondary | ICD-10-CM

## 2021-09-08 DIAGNOSIS — I25708 Atherosclerosis of coronary artery bypass graft(s), unspecified, with other forms of angina pectoris: Secondary | ICD-10-CM | POA: Diagnosis not present

## 2021-09-08 DIAGNOSIS — R072 Precordial pain: Secondary | ICD-10-CM

## 2021-09-08 DIAGNOSIS — I10 Essential (primary) hypertension: Secondary | ICD-10-CM | POA: Diagnosis not present

## 2021-09-08 DIAGNOSIS — I5021 Acute systolic (congestive) heart failure: Secondary | ICD-10-CM

## 2021-09-08 MED ORDER — NITROGLYCERIN 0.4 MG SL SUBL: 0.4000 mg | SUBLINGUAL_TABLET | SUBLINGUAL | 3 refills | Status: DC | PRN

## 2021-09-08 MED ORDER — POTASSIUM CHLORIDE ER 10 MEQ PO TBCR: 20.0000 meq | EXTENDED_RELEASE_TABLET | Freq: Every day | ORAL | 3 refills | Status: DC

## 2021-09-08 MED ORDER — TORSEMIDE 20 MG PO TABS: 40.0000 mg | ORAL_TABLET | Freq: Every day | ORAL | 3 refills | Status: DC

## 2021-09-08 NOTE — Patient Instructions (Addendum)
Medication Instructions:  DECREASE TORSEMIDE TO TWO TABLETS ONCE DAILY DECREASE POTASSIUM TO TWO TABLETS ONCE DAILY  *If you need a refill on your cardiac medications before your next appointment, please call your pharmacy*   Lab Work: Your physician recommends that you return for lab work TODAY BMET  If you have labs (blood work) drawn today and your tests are completely normal, you will receive your results only by: [employer] (if you have MyChart) OR A paper copy in the mail If you have any lab test that is abnormal or we need to change your treatment, we will call you to review the results.   Testing/Procedures: Your physician has requested that you have a lexiscan myoview. For further information please visit [email or web address]. Please follow instruction sheet, as given. [place]    Follow-Up: At [facility], you and your health needs are our priority.  As part of our continuing mission to provide you with exceptional heart care, we have created designated Provider Care Teams.  These Care Teams include your primary Cardiologist (physician) and Advanced Practice Providers (APPs -  Physician Assistants and Nurse Practitioners) who all work together to provide you with the care you need, when you need it.  We recommend signing up for the patient portal called "MyChart".  Sign up information is provided on this After Visit Summary.  MyChart is used to connect with patients for Virtual Visits (Telemedicine).  Patients are able to view lab/test results, encounter notes, upcoming appointments, etc.  Non-urgent messages can be sent to your provider as well.   To learn more about what you can do with MyChart, go to [email or web address].    Your next appointment:   3 month(s)  The format for your next appointment:   In Person  Provider:   Quay Burow, MD   Other Instructions

## 2021-09-08 NOTE — Progress Notes (Signed)
Cardiology Office Note:    Date:  09/10/2021   ID:  Gregory, Shepherd 19-Aug-1946, MRN [id number]  PCP:  Kirk Ruths, MD   [facility] Providers Cardiologist:  Quay Burow, MD     Referring MD: Kirk Ruths, MD   Chief Complaint  Patient presents with   Follow-up    Seen for Dr. Gwenlyn Found    History of Present Illness:    Gregory Shepherd is a 75 y.o. male with a hx of HTN, HLD, tobacco abuse, CAD s/p CABG( x2 at [facility] by Dr. Eliberto Ivory, redo x3 at [facility]) and PAD.  He had a remote MI at age 43.  He was referred to Dr. Gwenlyn Found for evaluation of claudication symptoms.  Lower extremity Doppler performed in March 2020 revealed right ABI 0.6, left ABI 0.8.  He underwent lower extremity angiography in June 2020 revealing 99% calcified ostial to proximal right common iliac artery stenosis, 60 to 70% left common iliac artery stenosis.  He underwent diamondback orbital rotational atherectomy, PTA and stenting with excellent result.  2D echo performed in March 2022 revealed a normal EF, grade 2 DD, normal valve function.  More recently, patient presented to the hospital in July 2022 with shortness of breath.  The symptom has been worsening laying flat for 2 weeks.  He also endorsed some chest burning sensation.  BNP was 430.  His symptom was attributed to acute on chronic diastolic heart failure and was treated with IV Lasix.  Elevated troponins (22 --> 36) during the hospitalization was attributed to demand ischemia.   Since discharge, his Lasix has been switched to torsemide 20 mg tablet.  He was taking 40 mg daily of torsemide and 10 mEq twice a day of potassium chloride.  When I saw the patient on 08/12/2021, he had 2-3+ pitting edema in bilateral lower extremity and a bilateral crackles on physical exam.  He was clearly volume overloaded.  EKG obtained in the office showed atrial fibrillation.  Looking back, he was already in new atrial fibrillation during  hospitalization in July that was mistakenly interpreted as undetermined rhythm.  I discussed the case with DOD Dr. Margaretann Loveless, we stopped his Plavix and switched him to 5 mg twice a day of Eliquis.  We will I also discussed with the patient that he should be admitted to the hospital to undergo IV diuresis and potentially consider TEE DCCV, however he wished to do a trial of outpatient management first.  I increase his torsemide to 40 mg twice a day and increase his potassium to 20 meq twice a day dosing.  By the time he returned to the office on 9/12, he has had significant diuresis and lost about 7 pounds.  Lower extremity edema significantly improved.  Given the significant improvement of heart failure symptom and good rate control, there was no need to admit him to the hospital anymore.  Therefore I spoke with Dr. Gwenlyn Found and switched his atenolol to carvedilol and is set up mild for outpatient TEE DCCV.  Unfortunately, Dr. Oval Linsey tried to shock him 3 different times during the cardioversion on 9/20 without success.  He remains in rate controlled atrial fibrillation afterward.  I have since reduced his torsemide to 40 mg daily and reduce his potassium to 20 meq daily.  Patient presents today for follow-up.  He appears to be euvolemic on physical exam.  I have recently reduced his torsemide to 40 mg daily (2 of the 20 mg tablets)  and his potassium reduced down to 20 meq daily (2 of the 10 meq tablets).  I recommended continue on the current therapy.  His current major concern is intermittent chest discomfort for the past 2 weeks.  Symptoms may occur at rest or with exertion.  I recommended proceeding with Lexiscan nuclear stress test.  Otherwise, he has been referred to EP service given persistent A. fib.  If Myoview is negative, that his current fatigue is likely related to atrial fibrillation.    Past Medical History:  Diagnosis Date   Carotid artery narrowing    History of blood clots    History of colon  polyps    Hyperlipidemia    Hypertension    MI (myocardial infarction) [facility])     Past Surgical History:  Procedure Laterality Date   ABDOMINAL AORTOGRAM W/LOWER EXTREMITY N/A 06/04/2019   Procedure: ABDOMINAL AORTOGRAM W/LOWER EXTREMITY;  Surgeon: Lorretta Harp, MD;  Location: [place] CV LAB;  Service: Cardiovascular;  Laterality: N/A;   CARDIAC SURGERY     x2    CARDIOVERSION N/A 08/25/2021   Procedure: CARDIOVERSION;  Surgeon: Skeet Latch, MD;  Location: [facility] ENDOSCOPY;  Service: Cardiovascular;  Laterality: N/A;   COLONOSCOPY     Age 73. [facility]   CORONARY ARTERY BYPASS GRAFT  1990   x 2 at [facility], redo x 3 @ [place] approx [place] Right 06/04/2019   Procedure: PERIPHERAL VASCULAR ATHERECTOMY;  Surgeon: Lorretta Harp, MD;  Location: [place] CV LAB;  Service: Cardiovascular;  Laterality: Right;  common iliac   PERIPHERAL VASCULAR INTERVENTION Right 06/04/2019   Procedure: PERIPHERAL VASCULAR INTERVENTION;  Surgeon: Lorretta Harp, MD;  Location: [place] CV LAB;  Service: Cardiovascular;  Laterality: Right;  common iliac   TEE WITHOUT CARDIOVERSION N/A 08/25/2021   Procedure: TRANSESOPHAGEAL ECHOCARDIOGRAM (TEE);  Surgeon: Skeet Latch, MD;  Location: [facility] ENDOSCOPY;  Service: Cardiovascular;  Laterality: N/A;    Current Medications: Current Meds  Medication Sig   apixaban (ELIQUIS) 5 MG TABS tablet Take 1 tablet (5 mg total) by mouth 2 (two) times daily.   aspirin 81 MG chewable tablet Chew 81 mg by mouth daily.   atorvastatin (LIPITOR) 80 MG tablet Take 80 mg by mouth daily.   BREO ELLIPTA 100-25 MCG/INH AEPB Inhale 1 puff into the lungs daily.   carvedilol (COREG) 6.25 MG tablet Take 1 tablet (6.25 mg total) by mouth 2 (two) times daily.   cholecalciferol (VITAMIN D3) 25 MCG (1000 UT) tablet Take 4,000 Units by mouth daily.   finasteride (PROSCAR) 5 MG tablet Take 1 tablet (5 mg total) by mouth daily.    gabapentin (NEURONTIN) 300 MG capsule Take 300 mg by mouth in the morning, at noon, in the evening, and at bedtime.   isosorbide mononitrate (IMDUR) 60 MG 24 hr tablet Take 60 mg by mouth daily.   lansoprazole (PREVACID) 30 MG capsule Take 1 capsule (30 mg total) by mouth 2 (two) times daily before a meal. (Patient taking differently: Take 30 mg by mouth daily before supper. 30 minutes before)   Multiple Vitamin (MULTIVITAMIN) tablet Take 1 tablet by mouth daily.   traZODone (DESYREL) 50 MG tablet Take 0.5 tablets (25 mg total) by mouth 2 (two) times daily. Take 25 tablets by mouth 2 (two) times daily.   vitamin B-12 (CYANOCOBALAMIN) 1000 MCG tablet Take 1,000 mcg by mouth daily.   vitamin C (ASCORBIC ACID) 500 MG tablet Take 500 mg by mouth  daily.   [DISCONTINUED] nitroGLYCERIN (NITROSTAT) 0.4 MG SL tablet Place 0.4 mg under the tongue as needed for chest pain.   [DISCONTINUED] potassium chloride (KLOR-CON) 10 MEQ tablet Take 2 tablets (20 mEq total) by mouth 2 (two) times daily.   [DISCONTINUED] torsemide (DEMADEX) 20 MG tablet Take 2 tablets (40 mg total) by mouth 2 (two) times daily.     Allergies:   Omeprazole and Other   Social History   Socioeconomic History   Marital status: Married    Spouse name: Not on file   Number of children: Not on file   Years of education: Not on file   Highest education level: Not on file  Occupational History   Not on file  Tobacco Use   Smoking status: Every Day    Types: Cigarettes   Smokeless tobacco: Never   Tobacco comments:    8-9 cigarettes per day  Vaping Use   Vaping Use: Never used  Substance and Sexual Activity   Alcohol use: No   Drug use: No   Sexual activity: Not on file  Other Topics Concern   Not on file  Social History Narrative   Not on file   Social Determinants of Health   Financial Resource Strain: Not on file  Food Insecurity: Not on file  Transportation Needs: Not on file  Physical Activity: Not on file   Stress: Not on file  Social Connections: Not on file     Family History: The patient's family history includes Diabetes in his sister; Heart disease in his father and mother. There is no history of Prostate cancer, Bladder Cancer, Kidney cancer, Colon cancer, or Esophageal cancer.  ROS:   Please see the history of present illness.     All other systems reviewed and are negative.  EKGs/Labs/Other Studies Reviewed:    The following studies were reviewed today:  TEE 08/25/2021  1. Left ventricular ejection fraction, by estimation, is 20 to 25%. The  left ventricle has severely decreased function. The left ventricle  demonstrates global hypokinesis. Left ventricular diastolic function could  not be evaluated.   2. Right ventricular systolic function is moderately reduced. The right  ventricular size is normal.   3. Left atrial size was moderately dilated. No left atrial/left atrial  appendage thrombus was detected.   4. The mitral valve is normal in structure. Mild mitral valve  regurgitation. No evidence of mitral stenosis.   5. The aortic valve is tricuspid. There is mild calcification of the  aortic valve. There is mild thickening of the aortic valve. Aortic valve  regurgitation is mild. Mild aortic valve sclerosis is present, with no  evidence of aortic valve stenosis.   6. There is mild (Grade II) atheroma plaque involving the descending  aorta.   7. The inferior vena cava is normal in size with greater than 50%  respiratory variability, suggesting right atrial pressure of 3 mmHg.   EKG:  EKG is ordered today.  The ekg ordered today demonstrates atrial fibrillation, right bundle branch block  Recent Labs: 06/19/2021: B Natriuretic Peptide 430.2; TSH 2.699 08/19/2021: Hemoglobin 11.7; Platelets 233 09/08/2021: BUN 18; Creatinine, Ser 1.15; Potassium 5.0; Sodium 144  Recent Lipid Panel No results found for: CHOL, TRIG, HDL, CHOLHDL, VLDL, LDLCALC, LDLDIRECT   Risk  Assessment/Calculations:    CHA2DS2-VASc Score = 5   This indicates a 7.2% annual risk of stroke. The patient's score is based upon: CHF History: 1 HTN History: 1 Diabetes History: 0 Stroke History: 0  Vascular Disease History: 1 Age Score: 2 Gender Score: 0          Physical Exam:    VS:  BP 120/60 (BP Location: Left Arm, Patient Position: Sitting, Cuff Size: Normal)   Pulse 70   Ht 6\' 1"  (1.854 m)   Wt 191 lb (86.6 kg)   BMI 25.20 kg/m     Wt Readings from Last 3 Encounters:  09/10/21 191 lb (86.6 kg)  09/08/21 191 lb (86.6 kg)  08/25/21 185 lb (83.9 kg)     GEN:  Well nourished, well developed in no acute distress HEENT: Normal NECK: No JVD; No carotid bruits LYMPHATICS: No lymphadenopathy CARDIAC: Irregularly irregular, no murmurs, rubs, gallops RESPIRATORY:  Clear to auscultation without rales, wheezing or rhonchi  ABDOMEN: Soft, non-tender, non-distended MUSCULOSKELETAL:  No edema; No deformity  SKIN: Warm and dry NEUROLOGIC:  Alert and oriented x 3 PSYCHIATRIC:  Normal affect   ASSESSMENT:    1. Precordial pain   2. Coronary artery disease involving coronary bypass graft of native heart with other forms of angina pectoris ([place])   3. Essential (primary) hypertension   4. Hyperlipidemia LDL goal <70   5. Persistent atrial fibrillation ([place])   6. Acute systolic heart failure (HCC)    PLAN:    In order of problems listed above:  Precordial chest pain: Proceed with Lexiscan Myoview.  Instructed the patient to hold any caffeinated drinks for 12 hours prior to Myoview  CAD: Continue aspirin and Lipitor  Hypertension: Blood pressure controlled on current therapy.  I previously switched him to carvedilol given the drop in the ejection fraction  Hyperlipidemia: On Lipitor  Persistent atrial fibrillation: Unfortunately he has failed the recent TEE DCCV.  Ejection fraction is clearly down when compared to earlier this year.  He is also short of breath and  fatigue.  I have referred him to EP service  Acute systolic heart failure: His chest discomfort is more recent and only occurred in the past 2 weeks.  It does not have clear association with exertion.  I recommended nuclear stress test.  My suspicion is her ejection fraction is likely dropped due to persistent atrial fibrillation.  Heart rate is fairly controlled at this point.  Patient is scheduled to see EP service.   Shared Decision Making/Informed Consent The risks [chest pain, shortness of breath, cardiac arrhythmias, dizziness, blood pressure fluctuations, myocardial infarction, stroke/transient ischemic attack, nausea, vomiting, allergic reaction, radiation exposure, metallic taste sensation and life-threatening complications (estimated to be 1 in 10,000)], benefits (risk stratification, diagnosing coronary artery disease, treatment guidance) and alternatives of a nuclear stress test were discussed in detail with Mr. Petties and he agrees to proceed.    Medication Adjustments/Labs and Tests Ordered: Current medicines are reviewed at length with the patient today.  Concerns regarding medicines are outlined above.  Orders Placed This Encounter  Procedures   Basic Metabolic Panel (BMET)   Cardiac Stress Test: Informed Consent Details: Physician/Practitioner Attestation; Transcribe to consent form and obtain patient signature   MYOCARDIAL PERFUSION IMAGING   EKG 12-Lead   Meds ordered this encounter  Medications   nitroGLYCERIN (NITROSTAT) 0.4 MG SL tablet    Sig: Place 1 tablet (0.4 mg total) under the tongue as needed for chest pain.    Dispense:  25 tablet    Refill:  3   torsemide (DEMADEX) 20 MG tablet    Sig: Take 2 tablets (40 mg total) by mouth daily.    Dispense:  180 tablet  Refill:  3    Dose change Rx increase   potassium chloride (KLOR-CON) 10 MEQ tablet    Sig: Take 2 tablets (20 mEq total) by mouth daily.    Dispense:  180 tablet    Refill:  3    Dose change Rx  increased    Patient Instructions  Medication Instructions:  DECREASE TORSEMIDE TO TWO TABLETS ONCE DAILY DECREASE POTASSIUM TO TWO TABLETS ONCE DAILY  *If you need a refill on your cardiac medications before your next appointment, please call your pharmacy*   Lab Work: Your physician recommends that you return for lab work TODAY BMET  If you have labs (blood work) drawn today and your tests are completely normal, you will receive your results only by: [employer] (if you have MyChart) OR A paper copy in the mail If you have any lab test that is abnormal or we need to change your treatment, we will call you to review the results.   Testing/Procedures: Your physician has requested that you have a lexiscan myoview. For further information please visit [email or web address]. Please follow instruction sheet, as given. [place]    Follow-Up: At [facility], you and your health needs are our priority.  As part of our continuing mission to provide you with exceptional heart care, we have created designated Provider Care Teams.  These Care Teams include your primary Cardiologist (physician) and Advanced Practice Providers (APPs -  Physician Assistants and Nurse Practitioners) who all work together to provide you with the care you need, when you need it.  We recommend signing up for the patient portal called "MyChart".  Sign up information is provided on this After Visit Summary.  MyChart is used to connect with patients for Virtual Visits (Telemedicine).  Patients are able to view lab/test results, encounter notes, upcoming appointments, etc.  Non-urgent messages can be sent to your provider as well.   To learn more about what you can do with MyChart, go to [email or web address].    Your next appointment:   3 month(s)  The format for your next appointment:   In Person  Provider:   Quay Burow, MD   Other Instructions     Signed, Almyra Deforest, [place]  09/10/2021  11:31 PM    [place]

## 2021-09-09 ENCOUNTER — Telehealth (HOSPITAL_COMMUNITY): Payer: Self-pay

## 2021-09-09 LAB — BASIC METABOLIC PANEL
BUN/Creatinine Ratio: 16 (ref 10–24)
BUN: 18 mg/dL (ref 8–27)
CO2: 23 mmol/L (ref 20–29)
Calcium: 9.4 mg/dL (ref 8.6–10.2)
Chloride: 104 mmol/L (ref 96–106)
Creatinine, Ser: 1.15 mg/dL (ref 0.76–1.27)
Glucose: 92 mg/dL (ref 70–99)
Potassium: 5 mmol/L (ref 3.5–5.2)
Sodium: 144 mmol/L (ref 134–144)
eGFR: 66 mL/min/{1.73_m2} (ref >59)

## 2021-09-09 NOTE — Telephone Encounter (Signed)
Detailed instructions left on the patient's answering machine. Asked to call back with any questions. S.Gregory Shepherd All EMTP 

## 2021-09-09 NOTE — Progress Notes (Signed)
Kidney function and electrolyte very much stable

## 2021-09-10 ENCOUNTER — Other Ambulatory Visit: Payer: Self-pay

## 2021-09-10 ENCOUNTER — Encounter: Payer: Self-pay | Admitting: Physician Assistant

## 2021-09-10 ENCOUNTER — Ambulatory Visit (HOSPITAL_COMMUNITY): Payer: Medicare Other | Attending: Cardiology

## 2021-09-10 DIAGNOSIS — R072 Precordial pain: Secondary | ICD-10-CM | POA: Insufficient documentation

## 2021-09-10 LAB — MYOCARDIAL PERFUSION IMAGING
LV dias vol: 234 mL (ref 62–150)
LV sys vol: 162 mL
Nuc Stress EF: 30 %
Peak HR: 67 {beats}/min
Rest HR: 60 {beats}/min
Rest Nuclear Isotope Dose: 11 mCi
SDS: 8
SRS: 3
SSS: 11
ST Depression (mm): 0 mm
Stress Nuclear Isotope Dose: 31.9 mCi
TID: 1.1

## 2021-09-10 MED ORDER — TECHNETIUM TC 99M TETROFOSMIN IV KIT
11.0000 | PACK | Freq: Once | INTRAVENOUS | Status: AC | PRN
  Administered 2021-09-10: 11 via INTRAVENOUS
  Filled 2021-09-10: qty 11

## 2021-09-10 MED ORDER — REGADENOSON 0.4 MG/5ML IV SOLN
0.4000 mg | Freq: Once | INTRAVENOUS | Status: AC
  Administered 2021-09-10: 0.4 mg via INTRAVENOUS

## 2021-09-10 MED ORDER — TECHNETIUM TC 99M TETROFOSMIN IV KIT
31.9000 | PACK | Freq: Once | INTRAVENOUS | Status: AC | PRN
  Administered 2021-09-10: 31.9 via INTRAVENOUS
  Filled 2021-09-10: qty 32

## 2021-09-15 NOTE — Progress Notes (Signed)
Electrophysiology Office Note:    Date:  09/16/2021   ID:  Gregory Shepherd, Gregory Shepherd November 16, 1946, MRN [id number]  PCP:  Kirk Ruths, MD  [facility] Cardiologist:  Quay Burow, MD  [place] Electrophysiologist:  None   Referring MD: Almyra Deforest, [place]   Chief Complaint: Atrial fibrillation  History of Present Illness:    Gregory Shepherd is a 75 y.o. male who presents for an evaluation of atrial fibrillation at the request of Almyra Deforest, PA-C. Their medical history includes coronary artery disease post bypass surgery in 1990 and 1998, peripheral vascular disease, tobacco abuse, hypertension, hyperlipidemia.  The patient was last seen September 08, 2021.  The patient's heart failure meds including diuretics have been adjusted recently.  He underwent a cardioversion that was unsuccessful on August 25, 2021.  Left ventricular function is severely decreased on recent transesophageal echo at 20%.  Because of his newly depressed ejection fraction in the setting of persistent atrial fibrillation, he is referred for EP evaluation. On September 08, 2021 he failed cardioversion at 150, 200, 200 J.  The patient is in clinic with his wife today.  He tells me that he is quite active.  He walks the dogs daily outside.  He is up throughout the day staying busy.  No limitations in his ADLs.  The only symptom he has with his A. fib is fatigue and some shortness of breath at times.  No syncope or presyncope.  Past Medical History:  Diagnosis Date   Carotid artery narrowing    History of blood clots    History of colon polyps    Hyperlipidemia    Hypertension    MI (myocardial infarction) [facility])     Past Surgical History:  Procedure Laterality Date   ABDOMINAL AORTOGRAM W/LOWER EXTREMITY N/A 06/04/2019   Procedure: ABDOMINAL AORTOGRAM W/LOWER EXTREMITY;  Surgeon: Lorretta Harp, MD;  Location: [place] CV LAB;  Service: Cardiovascular;  Laterality: N/A;   CARDIAC SURGERY     x2     CARDIOVERSION N/A 08/25/2021   Procedure: CARDIOVERSION;  Surgeon: Skeet Latch, MD;  Location: [facility] ENDOSCOPY;  Service: Cardiovascular;  Laterality: N/A;   COLONOSCOPY     Age 74. [facility]   CORONARY ARTERY BYPASS GRAFT  1990   x 2 at [facility], redo x 3 @ [place] approx [place] Right 06/04/2019   Procedure: PERIPHERAL VASCULAR ATHERECTOMY;  Surgeon: Lorretta Harp, MD;  Location: [place] CV LAB;  Service: Cardiovascular;  Laterality: Right;  common iliac   PERIPHERAL VASCULAR INTERVENTION Right 06/04/2019   Procedure: PERIPHERAL VASCULAR INTERVENTION;  Surgeon: Lorretta Harp, MD;  Location: [place] CV LAB;  Service: Cardiovascular;  Laterality: Right;  common iliac   TEE WITHOUT CARDIOVERSION N/A 08/25/2021   Procedure: TRANSESOPHAGEAL ECHOCARDIOGRAM (TEE);  Surgeon: Skeet Latch, MD;  Location: [facility] ENDOSCOPY;  Service: Cardiovascular;  Laterality: N/A;    Current Medications: Current Meds  Medication Sig   apixaban (ELIQUIS) 5 MG TABS tablet Take 1 tablet (5 mg total) by mouth 2 (two) times daily.   aspirin 81 MG chewable tablet Chew 81 mg by mouth daily.   atorvastatin (LIPITOR) 80 MG tablet Take 80 mg by mouth daily.   BREO ELLIPTA 100-25 MCG/INH AEPB Inhale 1 puff into the lungs daily.   carvedilol (COREG) 6.25 MG tablet Take 1 tablet (6.25 mg total) by mouth 2 (two) times daily.   cholecalciferol (VITAMIN D3) 25 MCG (1000 UT) tablet Take  4,000 Units by mouth daily.   finasteride (PROSCAR) 5 MG tablet Take 1 tablet (5 mg total) by mouth daily.   gabapentin (NEURONTIN) 300 MG capsule Take 300 mg by mouth in the morning, at noon, in the evening, and at bedtime.   isosorbide mononitrate (IMDUR) 60 MG 24 hr tablet Take 60 mg by mouth daily.   lansoprazole (PREVACID) 30 MG capsule Take 1 capsule (30 mg total) by mouth 2 (two) times daily before a meal. (Patient taking differently: Take 30 mg by mouth daily before supper. 30  minutes before)   Multiple Vitamin (MULTIVITAMIN) tablet Take 1 tablet by mouth daily.   nitroGLYCERIN (NITROSTAT) 0.4 MG SL tablet Place 1 tablet (0.4 mg total) under the tongue as needed for chest pain.   potassium chloride (KLOR-CON) 10 MEQ tablet Take 2 tablets (20 mEq total) by mouth daily.   torsemide (DEMADEX) 20 MG tablet Take 2 tablets (40 mg total) by mouth daily.   traZODone (DESYREL) 50 MG tablet Take 0.5 tablets (25 mg total) by mouth 2 (two) times daily. Take 25 tablets by mouth 2 (two) times daily. (Patient taking differently: Take 25 mg by mouth 2 (two) times daily.)   vitamin B-12 (CYANOCOBALAMIN) 1000 MCG tablet Take 1,000 mcg by mouth daily.   vitamin C (ASCORBIC ACID) 500 MG tablet Take 500 mg by mouth daily.   [DISCONTINUED] lansoprazole (PREVACID) 30 MG capsule Take by mouth.     Allergies:   Omeprazole and Other   Social History   Socioeconomic History   Marital status: Married    Spouse name: Not on file   Number of children: Not on file   Years of education: Not on file   Highest education level: Not on file  Occupational History   Not on file  Tobacco Use   Smoking status: Every Day    Types: Cigarettes   Smokeless tobacco: Never   Tobacco comments:    8-9 cigarettes per day  Vaping Use   Vaping Use: Never used  Substance and Sexual Activity   Alcohol use: No   Drug use: No   Sexual activity: Not on file  Other Topics Concern   Not on file  Social History Narrative   Not on file   Social Determinants of Health   Financial Resource Strain: Not on file  Food Insecurity: Not on file  Transportation Needs: Not on file  Physical Activity: Not on file  Stress: Not on file  Social Connections: Not on file     Family History: The patient's family history includes Diabetes in his sister; Heart disease in his father and mother. There is no history of Prostate cancer, Bladder Cancer, Kidney cancer, Colon cancer, or Esophageal cancer.  ROS:   Please  see the history of present illness.    All other systems reviewed and are negative.  EKGs/Labs/Other Studies Reviewed:    The following studies were reviewed today:  August 25, 2021 transesophageal echo Left ventricular function severely decreased, 20% Right ventricular function moderately decreased, moderately dilated left atrium Mild MR  September 10, 2021 SPECT Inferior/inferolateral scar with small area of peri-infarct ischemia Left ventricular function severely decreased  August 13, 2021 echo Left ventricular function moderately decreased, 35 to 40% Right ventricular function moderately decreased Moderately dilated left atrium Moderate pleural effusion Mild MR  September 08, 2021 EKG Right bundle branch block Atrial fibrillation QT 440 QTc 446ms   EKG:  The ekg ordered today demonstrates atrial fibrillation, right bundle branch block,  ventricular rate of 68 bpm.  Recent Labs: 06/19/2021: B Natriuretic Peptide 430.2; TSH 2.699 08/19/2021: Hemoglobin 11.7; Platelets 233 09/08/2021: BUN 18; Creatinine, Ser 1.15; Potassium 5.0; Sodium 144  Recent Lipid Panel No results found for: CHOL, TRIG, HDL, CHOLHDL, VLDL, LDLCALC, LDLDIRECT  Physical Exam:    VS:  BP 120/72 (BP Location: Left Arm, Patient Position: Sitting, Cuff Size: Normal)   Pulse 68   Ht 6\' 1"  (1.854 m)   Wt 189 lb (85.7 kg)   SpO2 96%   BMI 24.94 kg/m     Wt Readings from Last 3 Encounters:  09/16/21 189 lb (85.7 kg)  09/10/21 191 lb (86.6 kg)  09/08/21 191 lb (86.6 kg)     GEN:  Well nourished, well developed in no acute distress HEENT: Normal NECK: No JVD; No carotid bruits LYMPHATICS: No lymphadenopathy CARDIAC: Irregularly irregular, no murmurs, rubs, gallops RESPIRATORY:  Clear to auscultation without rales, wheezing or rhonchi  ABDOMEN: Soft, non-tender, non-distended MUSCULOSKELETAL: Trivial to 1+ pitting bilateral lower extremity edema; No deformity  SKIN: Warm and dry NEUROLOGIC:  Alert  and oriented x 3 PSYCHIATRIC:  Normal affect   ASSESSMENT:    1. Persistent atrial fibrillation ([place])   2. Chronic combined systolic and diastolic heart failure ([place])   3. Coronary artery disease involving coronary bypass graft of native heart without angina pectoris    PLAN:    In order of problems listed above:   1. Persistent atrial fibrillation (HCC) Symptomatic.  Rhythm control strategy is indicated given the presence of left ventricular dysfunction.  I discussed the options including amiodarone, dofetilide and ablation.  He would like to avoid an additional drug if possible especially given the possible side effects of the Tikosyn and amnio.  I discussed the ablation procedure in detail with the patient and his wife including the risk, efficacy and recovery.  I discussed the possibility of it not being successful in the need for antiarrhythmic drugs in the future.  They would like to proceed with scheduling.  He will need a CT scan prior to the procedure.  Risk, benefits, and alternatives to EP study and radiofrequency ablation for afib were also discussed in detail today. These risks include but are not limited to stroke, bleeding, vascular damage, tamponade, perforation, damage to the esophagus, lungs, and other structures, pulmonary vein stenosis, worsening renal function, and death. The patient understands these risk and wishes to proceed.  We will therefore proceed with catheter ablation at the next available time.  Carto, ICE, anesthesia are requested for the procedure.  Will also obtain CT PV protocol prior to the procedure to exclude LAA thrombus and further evaluate atrial anatomy.   2. Chronic combined systolic and diastolic heart failure (HCC) NYHA class II today.  Warm and very slightly volume overloaded on exam.  He will continue Coreg, torsemide, Imdur.  I have asked him to increase his torsemide to twice daily 2 times per week and monitor his weights at home.  3. Coronary  artery disease involving coronary bypass graft of native heart without angina pectoris No ischemic symptoms today.  Continue aspirin and Eliquis.     Total time spent with patient today 65 minutes. This includes reviewing records, evaluating the patient and coordinating care.  Medication Adjustments/Labs and Tests Ordered: Current medicines are reviewed at length with the patient today.  Concerns regarding medicines are outlined above.  Orders Placed This Encounter  Procedures   EKG 12-Lead   No orders of the defined types were  placed in this encounter.    Signed, Hilton Cork. Quentin Ore, MD, [facility], [facility] 09/16/2021 8:47 AM    Electrophysiology [facility]

## 2021-09-16 ENCOUNTER — Other Ambulatory Visit: Payer: Self-pay

## 2021-09-16 ENCOUNTER — Encounter: Payer: Self-pay | Admitting: Cardiology

## 2021-09-16 ENCOUNTER — Ambulatory Visit: Payer: Medicare Other | Admitting: Cardiology

## 2021-09-16 VITALS — BP 120/72 | HR 68 | Ht 73.0 in | Wt 189.0 lb

## 2021-09-16 DIAGNOSIS — I25810 Atherosclerosis of coronary artery bypass graft(s) without angina pectoris: Secondary | ICD-10-CM | POA: Diagnosis not present

## 2021-09-16 DIAGNOSIS — I5042 Chronic combined systolic (congestive) and diastolic (congestive) heart failure: Secondary | ICD-10-CM

## 2021-09-16 DIAGNOSIS — I4819 Other persistent atrial fibrillation: Secondary | ICD-10-CM | POA: Diagnosis not present

## 2021-09-16 NOTE — Patient Instructions (Addendum)
Medication Instructions:  Your physician recommends that you continue on your current medications as directed. Please refer to the Current Medication list given to you today. *If you need a refill on your cardiac medications before your next appointment, please call your pharmacy*  Lab Work: None ordered. If you have labs (blood work) drawn today and your tests are completely normal, you will receive your results only by: [place] (if you have MyChart) OR A paper copy in the mail If you have any lab test that is abnormal or we need to change your treatment, we will call you to review the results.  Testing/Procedures: None ordered.  Follow-Up: At [facility], you and your health needs are our priority.  As part of our continuing mission to provide you with exceptional heart care, we have created designated Provider Care Teams.  These Care Teams include your primary Cardiologist (physician) and Advanced Practice Providers (APPs -  Physician Assistants and Nurse Practitioners) who all work together to provide you with the care you need, when you need it.  Your next appointment:   Your physician wants you to follow-up post ablation November 06, 2021: 4 weeks with afib clinic 3 months with Dr. Quentin Ore  Cardiac Ablation Cardiac ablation is a procedure to destroy, or ablate, a small amount of heart tissue in very specific places. The heart has many electrical connections. Sometimes these connections are abnormal and can cause the heart to beat very fast or irregularly. Ablating some of the areas that cause problems can improve the heart's rhythm or return it to normal. Ablation may be done for people who: Have Wolff-Parkinson-White syndrome. Have fast heart rhythms (tachycardia). Have taken medicines for an abnormal heart rhythm (arrhythmia) that were not effective or caused side effects. Have a high-risk heartbeat that may be life-threatening. During the procedure, a small incision is  made in the neck or the groin, and a long, thin tube (catheter) is inserted into the incision and moved to the heart. Small devices (electrodes) on the tip of the catheter will send out electrical currents. A type of X-ray (fluoroscopy) will be used to help guide the catheter and to provide images of the heart. Tell a health care provider about: Any allergies you have. All medicines you are taking, including vitamins, herbs, eye drops, creams, and over-the-counter medicines. Any problems you or family members have had with anesthetic medicines. Any blood disorders you have. Any surgeries you have had. Any medical conditions you have, such as kidney failure. Whether you are pregnant or may be pregnant. What are the risks? Generally, this is a safe procedure. However, problems may occur, including: Infection. Bruising and bleeding at the catheter insertion site. Bleeding into the chest, especially into the sac that surrounds the heart. This is a serious complication. Stroke or blood clots. Damage to nearby structures or organs. Allergic reaction to medicines or dyes. Need for a permanent pacemaker if the normal electrical system is damaged. A pacemaker is a small computer that sends electrical signals to the heart and helps your heart beat normally. The procedure not being fully effective. This may not be recognized until months later. Repeat ablation procedures are sometimes done. What happens before the procedure? Medicines Ask your health care provider about: Changing or stopping your regular medicines. This is especially important if you are taking diabetes medicines or blood thinners. Taking medicines such as aspirin and ibuprofen. These medicines can thin your blood. Do not take these medicines unless your health care provider tells  you to take them. Taking over-the-counter medicines, vitamins, herbs, and supplements. General instructions Follow instructions from your health care  provider about eating or drinking restrictions. Plan to have someone take you home from the hospital or clinic. If you will be going home right after the procedure, plan to have someone with you for 24 hours. Ask your health care provider what steps will be taken to prevent infection. What happens during the procedure?  An IV will be inserted into one of your veins. You will be given a medicine to help you relax (sedative). The skin on your neck or groin will be numbed. An incision will be made in your neck or your groin. A needle will be inserted through the incision and into a large vein in your neck or groin. A catheter will be inserted into the needle and moved to your heart. Dye may be injected through the catheter to help your surgeon see the area of the heart that needs treatment. Electrical currents will be sent from the catheter to ablate heart tissue in desired areas. There are three types of energy that may be used to do this: Heat (radiofrequency energy). Laser energy. Extreme cold (cryoablation). When the tissue has been ablated, the catheter will be removed. Pressure will be held on the insertion area to prevent a lot of bleeding. A bandage (dressing) will be placed over the insertion area. The exact procedure may vary among health care providers and hospitals. What happens after the procedure? Your blood pressure, heart rate, breathing rate, and blood oxygen level will be monitored until you leave the hospital or clinic. Your insertion area will be monitored for bleeding. You will need to lie still for a few hours to ensure that you do not bleed from the insertion area. Do not drive for 24 hours or as long as told by your health care provider. Summary Cardiac ablation is a procedure to destroy, or ablate, a small amount of heart tissue using an electrical current. This procedure can improve the heart rhythm or return it to normal. Tell your health care provider about any  medical conditions you may have and all medicines you are taking to treat them. This is a safe procedure, but problems may occur. Problems may include infection, bruising, damage to nearby organs or structures, or allergic reactions to medicines. Follow your health care provider's instructions about eating and drinking before the procedure. You may also be told to change or stop some of your medicines. After the procedure, do not drive for 24 hours or as long as told by your health care provider. This information is not intended to replace advice given to you by your health care provider. Make sure you discuss any questions you have with your health care provider. Document Revised: 10/01/2019 Document Reviewed: 10/01/2019 Elsevier Patient Education  [place].

## 2021-09-25 DIAGNOSIS — H25013 Cortical age-related cataract, bilateral: Secondary | ICD-10-CM | POA: Diagnosis not present

## 2021-09-25 DIAGNOSIS — H35362 Drusen (degenerative) of macula, left eye: Secondary | ICD-10-CM | POA: Diagnosis not present

## 2021-09-25 DIAGNOSIS — H2513 Age-related nuclear cataract, bilateral: Secondary | ICD-10-CM | POA: Diagnosis not present

## 2021-09-25 DIAGNOSIS — H35371 Puckering of macula, right eye: Secondary | ICD-10-CM | POA: Diagnosis not present

## 2021-10-02 DIAGNOSIS — Z23 Encounter for immunization: Secondary | ICD-10-CM | POA: Diagnosis not present

## 2021-10-16 ENCOUNTER — Telehealth: Payer: Self-pay | Admitting: Cardiovascular Disease

## 2021-10-16 ENCOUNTER — Other Ambulatory Visit: Payer: Self-pay

## 2021-10-16 DIAGNOSIS — I4819 Other persistent atrial fibrillation: Secondary | ICD-10-CM

## 2021-10-16 MED ORDER — APIXABAN 5 MG PO TABS: 5.0000 mg | ORAL_TABLET | Freq: Two times a day (BID) | ORAL | 0 refills | Status: DC

## 2021-10-16 NOTE — Telephone Encounter (Signed)
*  STAT* If patient is at the pharmacy, call can be transferred to refill team.   1. Which medications need to be refilled? (please list name of each medication and dose if known) apixaban (ELIQUIS) 5 MG TABS tablet  2. Which pharmacy/location (including street and city if local pharmacy) is medication to be sent to?  [place], [place] Phone:  [phone]  Fax:  [phone]      3. Do they need a 30 day or 90 day supply? 90 ds

## 2021-10-16 NOTE — Telephone Encounter (Signed)
Prescription refill request for Eliquis received. Indication: a fib Last office visit: 09/16/21 Scr: 1.15 Age: 75 Weight: 85kg

## 2021-10-19 ENCOUNTER — Other Ambulatory Visit
Admission: RE | Admit: 2021-10-19 | Discharge: 2021-10-19 | Disposition: A | Discharge status: Home / Self Care | Payer: Medicare Other | Attending: Cardiology | Admitting: Cardiology

## 2021-10-19 DIAGNOSIS — I4819 Other persistent atrial fibrillation: Secondary | ICD-10-CM | POA: Diagnosis not present

## 2021-10-19 DIAGNOSIS — I25810 Atherosclerosis of coronary artery bypass graft(s) without angina pectoris: Secondary | ICD-10-CM

## 2021-10-19 DIAGNOSIS — I5042 Chronic combined systolic (congestive) and diastolic (congestive) heart failure: Secondary | ICD-10-CM

## 2021-10-19 LAB — CBC WITH DIFFERENTIAL/PLATELET
Abs Immature Granulocytes: 0.01 10*3/uL (ref 0.00–0.07)
Basophils Absolute: 0.1 10*3/uL (ref 0.0–0.1)
Basophils Relative: 2 %
Eosinophils Absolute: 0.1 10*3/uL (ref 0.0–0.5)
Eosinophils Relative: 2 %
HCT: 34.2 % — ABNORMAL LOW (ref 39.0–52.0)
Hemoglobin: 10.8 g/dL — ABNORMAL LOW (ref 13.0–17.0)
Immature Granulocytes: 0 %
Lymphocytes Relative: 15 %
Lymphs Abs: 0.9 10*3/uL (ref 0.7–4.0)
MCH: 25.1 pg — ABNORMAL LOW (ref 26.0–34.0)
MCHC: 31.6 g/dL (ref 30.0–36.0)
MCV: 79.4 fL — ABNORMAL LOW (ref 80.0–100.0)
Monocytes Absolute: 0.5 10*3/uL (ref 0.1–1.0)
Monocytes Relative: 9 %
Neutro Abs: 4.2 10*3/uL (ref 1.7–7.7)
Neutrophils Relative %: 72 %
Platelets: 198 10*3/uL (ref 150–400)
RBC: 4.31 MIL/uL (ref 4.22–5.81)
RDW: 17.1 % — ABNORMAL HIGH (ref 11.5–15.5)
WBC: 5.8 10*3/uL (ref 4.0–10.5)
nRBC: 0 % (ref 0.0–0.2)

## 2021-10-19 LAB — BASIC METABOLIC PANEL
Anion gap: 5 (ref 5–15)
BUN: 17 mg/dL (ref 8–23)
CO2: 27 mmol/L (ref 22–32)
Calcium: 8.8 mg/dL — ABNORMAL LOW (ref 8.9–10.3)
Chloride: 103 mmol/L (ref 98–111)
Creatinine, Ser: 1.05 mg/dL (ref 0.61–1.24)
GFR, Estimated: >60 mL/min (ref >60)
Glucose, Bld: 87 mg/dL (ref 70–99)
Potassium: 3.8 mmol/L (ref 3.5–5.1)
Sodium: 135 mmol/L (ref 135–145)

## 2021-10-30 ENCOUNTER — Telehealth (HOSPITAL_COMMUNITY): Payer: Self-pay | Admitting: Emergency Medicine

## 2021-10-30 NOTE — Telephone Encounter (Signed)
Reaching out to patient to offer assistance regarding upcoming cardiac imaging study; pt verbalizes understanding of appt date/time, parking situation and where to check in, pre-test NPO status and medications ordered, and verified current allergies; name and call back number provided for further questions should they arise Marchia Bond RN Navigator Cardiac Imaging [facility] [phone] office [phone] cell  Denies iv issues Daily meds, holding torsemide

## 2021-11-02 ENCOUNTER — Ambulatory Visit
Admission: RE | Admit: 2021-11-02 | Discharge: 2021-11-02 | Disposition: A | Discharge status: Home / Self Care | Payer: Medicare Other | Source: Ambulatory Visit | Attending: Cardiology | Admitting: Cardiology

## 2021-11-02 ENCOUNTER — Other Ambulatory Visit: Payer: Self-pay

## 2021-11-02 DIAGNOSIS — I4819 Other persistent atrial fibrillation: Secondary | ICD-10-CM | POA: Insufficient documentation

## 2021-11-02 MED ORDER — NITROGLYCERIN 0.4 MG SL SUBL: 0.8000 mg | SUBLINGUAL_TABLET | Freq: Once | SUBLINGUAL | Status: DC

## 2021-11-02 MED ORDER — METOPROLOL TARTRATE 5 MG/5ML IV SOLN: 10.0000 mg | Freq: Once | INTRAVENOUS | Status: DC

## 2021-11-02 MED ORDER — IOHEXOL 350 MG/ML SOLN
75.0000 mL | Freq: Once | INTRAVENOUS | Status: AC | PRN
  Administered 2021-11-02: 11:00:00 75 mL via INTRAVENOUS

## 2021-11-02 NOTE — Progress Notes (Signed)
Patient tolerated procedure well. Ambulate w/o difficulty. Denies light headedness or being dizzy. Sitting in chair drinking water provided. Encouraged to drink extra water today and reasoning explained. Verbalized understanding. All questions answered. ABC intact. No further needs. Discharge from procedure area w/o issues.   °

## 2021-11-05 NOTE — Pre-Procedure Instructions (Signed)
Instructed patient on the following items: Arrival time 0830 Nothing to eat or drink after midnight No meds AM of procedure Responsible person to drive you home and stay with you for 24 hrs Wash with special soap night before and morning of procedure If on anti-coagulant drug instructions Eliquis- hasn't missed any doses

## 2021-11-06 ENCOUNTER — Ambulatory Visit (HOSPITAL_COMMUNITY): Payer: Medicare Other | Admitting: Certified Registered Nurse Anesthetist

## 2021-11-06 ENCOUNTER — Encounter (HOSPITAL_COMMUNITY)
Admission: RE | Disposition: A | Discharge status: Home / Self Care | Payer: Self-pay | Source: Home / Self Care | Attending: Cardiology

## 2021-11-06 ENCOUNTER — Encounter (HOSPITAL_COMMUNITY): Payer: Self-pay | Admitting: Cardiology

## 2021-11-06 ENCOUNTER — Ambulatory Visit (HOSPITAL_COMMUNITY)
Admission: RE | Admit: 2021-11-06 | Discharge: 2021-11-06 | Disposition: A | Discharge status: Home / Self Care | Payer: Medicare Other | Attending: Cardiology | Admitting: Cardiology

## 2021-11-06 ENCOUNTER — Other Ambulatory Visit: Payer: Self-pay

## 2021-11-06 DIAGNOSIS — R0989 Other specified symptoms and signs involving the circulatory and respiratory systems: Secondary | ICD-10-CM | POA: Diagnosis not present

## 2021-11-06 DIAGNOSIS — I4891 Unspecified atrial fibrillation: Secondary | ICD-10-CM | POA: Insufficient documentation

## 2021-11-06 DIAGNOSIS — I509 Heart failure, unspecified: Secondary | ICD-10-CM | POA: Insufficient documentation

## 2021-11-06 DIAGNOSIS — R600 Localized edema: Secondary | ICD-10-CM | POA: Insufficient documentation

## 2021-11-06 DIAGNOSIS — Z539 Procedure and treatment not carried out, unspecified reason: Secondary | ICD-10-CM | POA: Diagnosis not present

## 2021-11-06 DIAGNOSIS — R072 Precordial pain: Secondary | ICD-10-CM

## 2021-11-06 SURGERY — ATRIAL FIBRILLATION ABLATION
Anesthesia: General

## 2021-11-06 MED ORDER — POTASSIUM CHLORIDE ER 10 MEQ PO TBCR: 20.0000 meq | EXTENDED_RELEASE_TABLET | Freq: Two times a day (BID) | ORAL | 3 refills | Status: DC

## 2021-11-06 MED ORDER — SODIUM CHLORIDE 0.9 % IV SOLN: INTRAVENOUS | Status: DC

## 2021-11-06 MED ORDER — AMIODARONE HCL 200 MG PO TABS: ORAL_TABLET | ORAL | 6 refills | Status: DC

## 2021-11-06 MED ORDER — TORSEMIDE 20 MG PO TABS: 60.0000 mg | ORAL_TABLET | Freq: Two times a day (BID) | ORAL | 3 refills | Status: DC

## 2021-11-06 MED ORDER — CARVEDILOL 3.125 MG PO TABS: 3.1250 mg | ORAL_TABLET | Freq: Two times a day (BID) | ORAL | 6 refills | Status: DC

## 2021-11-06 NOTE — Anesthesia Procedure Notes (Signed)
Arterial Line Insertion Start/End12/01/2021 9:45 AM, 11/06/2021 9:55 AM Performed by: Kyung Rudd, CRNA  Patient location: Pre-op. Preanesthetic checklist: patient identified, IV checked, site marked, risks and benefits discussed, surgical consent, monitors and equipment checked, pre-op evaluation and timeout performed Lidocaine 1% used for infiltration Left, radial was placed Catheter size: 20 G Hand hygiene performed , maximum sterile barriers used  and Seldinger technique used Allen's test indicative of satisfactory collateral circulation Attempts: 1 Procedure performed without using ultrasound guided technique. Following insertion, Biopatch and dressing applied. Post procedure assessment: normal  Patient tolerated the procedure well with no immediate complications.

## 2021-11-06 NOTE — Anesthesia Preprocedure Evaluation (Addendum)
Anesthesia Evaluation  Patient identified by MRN, date of birth, ID band Patient awake    Airway Mallampati: II  TM Distance: >3 FB Neck ROM: Full    Dental no notable dental hx.    Pulmonary neg pulmonary ROS, Current Smoker,    Pulmonary exam normal        Cardiovascular hypertension, Pt. on medications and Pt. on home beta blockers + Past MI, + CABG (1990) and + Peripheral Vascular Disease  + dysrhythmias Atrial Fibrillation  Rhythm:Irregular Rate:Normal     Neuro/Psych negative neurological ROS  negative psych ROS   GI/Hepatic Neg liver ROS, GERD  Medicated,  Endo/Other  negative endocrine ROS  Renal/GU negative Renal ROS  negative genitourinary   Musculoskeletal negative musculoskeletal ROS (+)   Abdominal Normal abdominal exam  (+)   Peds  Hematology negative hematology ROS (+)   Anesthesia Other Findings   Reproductive/Obstetrics                            Anesthesia Physical Anesthesia Plan  ASA: 3  Anesthesia Plan: General   Post-op Pain Management:    Induction: Intravenous  PONV Risk Score and Plan: 1 and Ondansetron, Dexamethasone and Treatment may vary due to age or medical condition  Airway Management Planned: Mask and Oral ETT  Additional Equipment: Arterial line  Intra-op Plan:   Post-operative Plan: Extubation in OR  Informed Consent: I have reviewed the patients History and Physical, chart, labs and discussed the procedure including the risks, benefits and alternatives for the proposed anesthesia with the patient or authorized representative who has indicated his/her understanding and acceptance.     Dental advisory given  Plan Discussed with: CRNA  Anesthesia Plan Comments: (Lab Results      Component                Value               Date                      WBC                      5.8                 10/19/2021                HGB                       10.8 (L)            10/19/2021                HCT                      34.2 (L)            10/19/2021                MCV                      79.4 (L)            10/19/2021                PLT                      198  10/19/2021           Lab Results      Component                Value               Date                      NA                       135                 10/19/2021                K                        3.8                 10/19/2021                CO2                      27                  10/19/2021                GLUCOSE                  87                  10/19/2021                BUN                      17                  10/19/2021                CREATININE               1.05                10/19/2021                CALCIUM                  8.8 (L)             10/19/2021                EGFR                     66                  09/08/2021                GFRNONAA                 >60                 10/19/2021     Stress 10/22: .  Findings are consistent with prior myocardial infarction with peri-infarct ischemia. The study is high risk due to EF 30%. .  No ST deviation was noted. .  Left ventricular function is abnormal. Global function is severely reduced. Nuclear stress EF: 30 %. The left ventricular ejection fraction is severely decreased (<30%). End diastolic cavity size is severely enlarged. Marland Kitchen  Prior study not available for comparison.         ECHO 09/22: 1. Left ventricular ejection fraction, by estimation, is 20 to 25%. The  left ventricle has severely decreased function. The left ventricle  demonstrates global hypokinesis. Left ventricular diastolic function could  not be evaluated.  2. Right ventricular systolic function is moderately reduced. The right  ventricular size is normal.  3. Left atrial size was moderately dilated. No left atrial/left atrial  appendage thrombus was detected.  4. The mitral valve is normal in  structure. Mild mitral valve  regurgitation. No evidence of mitral stenosis.  5. The aortic valve is tricuspid. There is mild calcification of the  aortic valve. There is mild thickening of the aortic valve. Aortic valve  regurgitation is mild. Mild aortic valve sclerosis is present, with no  evidence of aortic valve stenosis.  6. There is mild (Grade II) atheroma plaque involving the descending  aorta.  7. The inferior vena cava is normal in size with greater than 50%  respiratory variability, suggesting right atrial pressure of 3 mmHg. )      Anesthesia Quick Evaluation

## 2021-11-06 NOTE — Progress Notes (Signed)
EP Brief Note  Gregory Shepherd presented for his A. fib ablation today.  Physical exam significant for 2+ pitting bilateral lower extremity edema to the knees.  He also had audible abnormal lung sounds consistent with pulmonary edema.  He told me he knows that he is holding onto fluid in his lungs and legs.  We discussed the ablation procedure safety and my concerns about his active heart failure decompensation.  I think would be best at this time to cancel today's procedure and drug load with amiodarone.  I did discuss Tikosyn but he would like to avoid inpatient hospitalization for drug loading.  I think this is reasonable.  We will plan for amiodarone 400 mg by mouth twice daily for 5 days followed by 400 mg by mouth once daily for 5 days followed by 200 mg by mouth daily thereafter.  He will see Gregory Shepherd in clinic next week for follow-up of both his heart failure and amiodarone medication.  We will also increase his torsemide to 60 mg by mouth twice daily and increase his potassium supplement to twice daily.  He will need blood work next week.  If he has not converted to sinus rhythm in the next 3 to 4 weeks, plan for cardioversion.  I will plan to see him several weeks after his cardioversion.  Plan discussed with his wife.  Gregory Galas T. Quentin Ore, MD, [facility], [facility] Cardiac Electrophysiology

## 2021-11-10 ENCOUNTER — Encounter: Payer: Self-pay | Admitting: Cardiology

## 2021-11-13 ENCOUNTER — Other Ambulatory Visit: Payer: Self-pay

## 2021-11-13 ENCOUNTER — Encounter: Payer: Self-pay | Admitting: Student

## 2021-11-13 ENCOUNTER — Ambulatory Visit: Payer: Medicare Other | Admitting: Student

## 2021-11-13 VITALS — BP 110/70 | HR 70 | Ht 73.0 in | Wt 193.0 lb

## 2021-11-13 DIAGNOSIS — I5021 Acute systolic (congestive) heart failure: Secondary | ICD-10-CM

## 2021-11-13 DIAGNOSIS — I4819 Other persistent atrial fibrillation: Secondary | ICD-10-CM | POA: Diagnosis not present

## 2021-11-13 DIAGNOSIS — I25810 Atherosclerosis of coronary artery bypass graft(s) without angina pectoris: Secondary | ICD-10-CM | POA: Diagnosis not present

## 2021-11-13 DIAGNOSIS — I10 Essential (primary) hypertension: Secondary | ICD-10-CM | POA: Diagnosis not present

## 2021-11-13 LAB — BASIC METABOLIC PANEL
BUN/Creatinine Ratio: 19 (ref 10–24)
BUN: 33 mg/dL — ABNORMAL HIGH (ref 8–27)
CO2: 27 mmol/L (ref 20–29)
Calcium: 9.3 mg/dL (ref 8.6–10.2)
Chloride: 102 mmol/L (ref 96–106)
Creatinine, Ser: 1.78 mg/dL — ABNORMAL HIGH (ref 0.76–1.27)
Glucose: 84 mg/dL (ref 70–99)
Potassium: 4.7 mmol/L (ref 3.5–5.2)
Sodium: 135 mmol/L (ref 134–144)
eGFR: 39 mL/min/{1.73_m2} — ABNORMAL LOW (ref >59)

## 2021-11-13 LAB — CBC
Hematocrit: 29.7 % — ABNORMAL LOW (ref 37.5–51.0)
Hemoglobin: 9.6 g/dL — ABNORMAL LOW (ref 13.0–17.7)
MCH: 24 pg — ABNORMAL LOW (ref 26.6–33.0)
MCHC: 32.3 g/dL (ref 31.5–35.7)
MCV: 74 fL — ABNORMAL LOW (ref 79–97)
Platelets: 192 10*3/uL (ref 150–450)
RBC: 4 x10E6/uL — ABNORMAL LOW (ref 4.14–5.80)
RDW: 17.5 % — ABNORMAL HIGH (ref 11.6–15.4)
WBC: 6.1 10*3/uL (ref 3.4–10.8)

## 2021-11-13 LAB — MAGNESIUM: Magnesium: 2.4 mg/dL — ABNORMAL HIGH (ref 1.6–2.3)

## 2021-11-13 NOTE — Patient Instructions (Addendum)
Your physician has recommended you make the following change in your medication:   INCREASE: Torsemide to 80mg  twice daily for 2 days then take 60mg  twice daily.   You are scheduled for a TEE Cardioversion on 11/23/2021 with Dr. Acie Fredrickson.  Please arrive at the [facility] (Main Entrance A) at [facility]: [address] [place], [place] [address] at 6:30 am.  DIET: Nothing to eat or drink after midnight except a sip of water with medications (see medication instructions below)  Medication Instructions: Hold Torsemide the morning of your procedure  Continue your anticoagulant: Eliquis You will need to continue your anticoagulant after your procedure until you  are told by your  Provider that it is safe to stop   Labs: TODAY: BMET, CBC, Mg  You must have a responsible person to drive you home and stay in the waiting area during your procedure. Failure to do so could result in cancellation.  Bring your insurance cards.  *Special Note: Every effort is made to have your procedure done on time. Occasionally there are emergencies that occur at the hospital that may cause delays. Please be patient if a delay does occur.

## 2021-11-13 NOTE — H&P (View-Only) (Signed)
PCP:  Kirk Ruths, MD Primary Cardiologist: Quay Burow, MD Electrophysiologist: Vickie Epley, MD   Gregory Shepherd is a 74 y.o. male seen today for Vickie Epley, MD for acute visit due to acute on chronic systolic CHF .  Pt presented 11/06/2021 for AF ablation and was noted to be markedly volume overload with 2+ pitting edema to the knees and crackles. Started on amiodarone and torsemide increased with close follow up scheduled.   Today, he feels about the same. Noted significant increase in UOP on increased torsemide, but also increased fluid intake to 4 large (32 oz) glasses of water daily. Taking second torsemide dose after dinner and getting up frequently in the night. Remains fatigued and SOB with more than mild activity. Orthopnea noted, can lift head of bed with improvement.   Past Medical History:  Diagnosis Date   Carotid artery narrowing    History of blood clots    History of colon polyps    Hyperlipidemia    Hypertension    MI (myocardial infarction) [facility])    Past Surgical History:  Procedure Laterality Date   ABDOMINAL AORTOGRAM W/LOWER EXTREMITY N/A 06/04/2019   Procedure: ABDOMINAL AORTOGRAM W/LOWER EXTREMITY;  Surgeon: Lorretta Harp, MD;  Location: [place] CV LAB;  Service: Cardiovascular;  Laterality: N/A;   CARDIAC SURGERY     x2    CARDIOVERSION N/A 08/25/2021   Procedure: CARDIOVERSION;  Surgeon: Skeet Latch, MD;  Location: [facility] ENDOSCOPY;  Service: Cardiovascular;  Laterality: N/A;   COLONOSCOPY     Age 72. [facility]   CORONARY ARTERY BYPASS GRAFT  1990   x 2 at [facility], redo x 3 @ [place] approx [place] Right 06/04/2019   Procedure: PERIPHERAL VASCULAR ATHERECTOMY;  Surgeon: Lorretta Harp, MD;  Location: [place] CV LAB;  Service: Cardiovascular;  Laterality: Right;  common iliac   PERIPHERAL VASCULAR INTERVENTION Right 06/04/2019   Procedure: PERIPHERAL VASCULAR  INTERVENTION;  Surgeon: Lorretta Harp, MD;  Location: [place] CV LAB;  Service: Cardiovascular;  Laterality: Right;  common iliac   TEE WITHOUT CARDIOVERSION N/A 08/25/2021   Procedure: TRANSESOPHAGEAL ECHOCARDIOGRAM (TEE);  Surgeon: Skeet Latch, MD;  Location: [facility] ENDOSCOPY;  Service: Cardiovascular;  Laterality: N/A;    Current Outpatient Medications  Medication Sig Dispense Refill   amiodarone (PACERONE) 200 MG tablet Take 2 tablets (400 mg) twice daily x 5 days, then 2 tablet (400 mg) daily x 5 days, then 1 tablet (200 mg) once daily. 60 tablet 6   apixaban (ELIQUIS) 5 MG TABS tablet Take 1 tablet (5 mg total) by mouth 2 (two) times daily. 180 tablet 0   aspirin 81 MG chewable tablet Chew 81 mg by mouth daily.     atorvastatin (LIPITOR) 80 MG tablet Take 80 mg by mouth daily.     BREO ELLIPTA 100-25 MCG/INH AEPB Inhale 1 puff into the lungs daily as needed (Shortness of breath).     carvedilol (COREG) 3.125 MG tablet Take 1 tablet (3.125 mg total) by mouth 2 (two) times daily. 60 tablet 6   cholecalciferol (VITAMIN D3) 25 MCG (1000 UT) tablet Take 1,000 Units by mouth daily.     finasteride (PROSCAR) 5 MG tablet Take 1 tablet (5 mg total) by mouth daily. 90 tablet 0   gabapentin (NEURONTIN) 300 MG capsule Take 300 mg by mouth in the morning, at noon, in the evening, and at bedtime.     isosorbide mononitrate (  IMDUR) 60 MG 24 hr tablet Take 60 mg by mouth daily.     lansoprazole (PREVACID) 30 MG capsule Take 1 capsule (30 mg total) by mouth 2 (two) times daily before a meal. 60 capsule 3   Multiple Vitamin (MULTIVITAMIN) tablet Take 1 tablet by mouth daily.     nitroGLYCERIN (NITROSTAT) 0.4 MG SL tablet Place 1 tablet (0.4 mg total) under the tongue as needed for chest pain. 25 tablet 3   potassium chloride (KLOR-CON) 10 MEQ tablet Take 2 tablets (20 mEq total) by mouth 2 (two) times daily. 180 tablet 3   torsemide (DEMADEX) 20 MG tablet Take 3 tablets (60 mg total) by mouth 2  (two) times daily. 180 tablet 3   traZODone (DESYREL) 50 MG tablet Take 0.5 tablets (25 mg total) by mouth 2 (two) times daily. Take 25 tablets by mouth 2 (two) times daily.     vitamin B-12 (CYANOCOBALAMIN) 1000 MCG tablet Take 1,000 mcg by mouth daily.     vitamin C (ASCORBIC ACID) 500 MG tablet Take 500 mg by mouth daily.     No current facility-administered medications for this visit.    Allergies  Allergen Reactions   Omeprazole     doesn't work well with plavix    Other Palpitations    Certain steroids give patient heart palpitations  Allergy to green peppers    Social History   Socioeconomic History   Marital status: Married    Spouse name: Not on file   Number of children: Not on file   Years of education: Not on file   Highest education level: Not on file  Occupational History   Not on file  Tobacco Use   Smoking status: Every Day    Types: Cigarettes   Smokeless tobacco: Never   Tobacco comments:    8-9 cigarettes per day  Vaping Use   Vaping Use: Never used  Substance and Sexual Activity   Alcohol use: No   Drug use: No   Sexual activity: Not on file  Other Topics Concern   Not on file  Social History Narrative   Not on file   Social Determinants of Health   Financial Resource Strain: Not on file  Food Insecurity: Not on file  Transportation Needs: Not on file  Physical Activity: Not on file  Stress: Not on file  Social Connections: Not on file  Intimate Partner Violence: Not on file     Review of Systems: All other systems reviewed and are otherwise negative except as noted above.  Physical Exam: Vitals:   11/13/21 0915  BP: 110/70  Pulse: 70  SpO2: 95%  Weight: 193 lb (87.5 kg)  Height: 6\' 1"  (1.854 m)   Wt Readings from Last 3 Encounters:  11/13/21 193 lb (87.5 kg)  11/06/21 195 lb (88.5 kg)  09/16/21 189 lb (85.7 kg)    GEN- The patient is well appearing, alert and oriented x 3 today.   HEENT: normocephalic, atraumatic; sclera  clear, conjunctiva pink; hearing intact; oropharynx clear; neck supple, JVD nearly to jaw with + HJR. Lymph- no cervical lymphadenopathy Lungs- Clear to ausculation bilaterally, normal work of breathing.  No wheezes, rales, rhonchi Heart- Irregularly irergular rate and rhythm, no murmurs, rubs or gallops, PMI not laterally displaced GI- soft, non-tender, non-distended, bowel sounds present, no hepatosplenomegaly Extremities- no clubbing, cyanosis, or edema; DP/PT/radial pulses 2+ bilaterally MS- no significant deformity or atrophy Skin- warm and dry, no rash or lesion Psych- euthymic mood, full affect  Neuro- strength and sensation are intact  EKG is ordered. Personal review shows AF at 70 bpm  Additional studies reviewed include: Previous EP notes.   Assessment and Plan:  1. Acute on chronic systolic CHF Remains volume overloaded on exam with JVD nearly to jaw. Increase torsemide to 80 mg BID x 2 days, then back to 60 mg BID.  Educated on fluid and sodium restriction.  Stat labs today.   2. Persistent atrial fibrillation Remains out of rhythm and quite symptomatic I think we may need NSR to adequately diurese.  Adjust diuretic as along with fluid/sodium restriction.  A dose of Eliquis was held in preparation of ablation.  Will plan TEE/DCC week of 12/19 and close follow up after to adjust diuresis if needed.  Will keep appt in January with Dr. Quentin Ore to plan eventual repeat ablation attempt  Follow up with EP APP post TEE/DCC  Shirley Friar, PA-C  11/13/21 9:33 AM

## 2021-11-13 NOTE — Progress Notes (Signed)
PCP:  Kirk Ruths, MD Primary Cardiologist: Quay Burow, MD Electrophysiologist: Vickie Epley, MD   Gregory Shepherd is a 75 y.o. male seen today for Vickie Epley, MD for acute visit due to acute on chronic systolic CHF .  Pt presented 11/06/2021 for AF ablation and was noted to be markedly volume overload with 2+ pitting edema to the knees and crackles. Started on amiodarone and torsemide increased with close follow up scheduled.   Today, he feels about the same. Noted significant increase in UOP on increased torsemide, but also increased fluid intake to 4 large (32 oz) glasses of water daily. Taking second torsemide dose after dinner and getting up frequently in the night. Remains fatigued and SOB with more than mild activity. Orthopnea noted, can lift head of bed with improvement.   Past Medical History:  Diagnosis Date   Carotid artery narrowing    History of blood clots    History of colon polyps    Hyperlipidemia    Hypertension    MI (myocardial infarction) [facility])    Past Surgical History:  Procedure Laterality Date   ABDOMINAL AORTOGRAM W/LOWER EXTREMITY N/A 06/04/2019   Procedure: ABDOMINAL AORTOGRAM W/LOWER EXTREMITY;  Surgeon: Lorretta Harp, MD;  Location: [place] CV LAB;  Service: Cardiovascular;  Laterality: N/A;   CARDIAC SURGERY     x2    CARDIOVERSION N/A 08/25/2021   Procedure: CARDIOVERSION;  Surgeon: Skeet Latch, MD;  Location: [facility] ENDOSCOPY;  Service: Cardiovascular;  Laterality: N/A;   COLONOSCOPY     Age 55. [facility]   CORONARY ARTERY BYPASS GRAFT  1990   x 2 at [facility], redo x 3 @ [place] approx [place] Right 06/04/2019   Procedure: PERIPHERAL VASCULAR ATHERECTOMY;  Surgeon: Lorretta Harp, MD;  Location: [place] CV LAB;  Service: Cardiovascular;  Laterality: Right;  common iliac   PERIPHERAL VASCULAR INTERVENTION Right 06/04/2019   Procedure: PERIPHERAL VASCULAR  INTERVENTION;  Surgeon: Lorretta Harp, MD;  Location: [place] CV LAB;  Service: Cardiovascular;  Laterality: Right;  common iliac   TEE WITHOUT CARDIOVERSION N/A 08/25/2021   Procedure: TRANSESOPHAGEAL ECHOCARDIOGRAM (TEE);  Surgeon: Skeet Latch, MD;  Location: [facility] ENDOSCOPY;  Service: Cardiovascular;  Laterality: N/A;    Current Outpatient Medications  Medication Sig Dispense Refill   amiodarone (PACERONE) 200 MG tablet Take 2 tablets (400 mg) twice daily x 5 days, then 2 tablet (400 mg) daily x 5 days, then 1 tablet (200 mg) once daily. 60 tablet 6   apixaban (ELIQUIS) 5 MG TABS tablet Take 1 tablet (5 mg total) by mouth 2 (two) times daily. 180 tablet 0   aspirin 81 MG chewable tablet Chew 81 mg by mouth daily.     atorvastatin (LIPITOR) 80 MG tablet Take 80 mg by mouth daily.     BREO ELLIPTA 100-25 MCG/INH AEPB Inhale 1 puff into the lungs daily as needed (Shortness of breath).     carvedilol (COREG) 3.125 MG tablet Take 1 tablet (3.125 mg total) by mouth 2 (two) times daily. 60 tablet 6   cholecalciferol (VITAMIN D3) 25 MCG (1000 UT) tablet Take 1,000 Units by mouth daily.     finasteride (PROSCAR) 5 MG tablet Take 1 tablet (5 mg total) by mouth daily. 90 tablet 0   gabapentin (NEURONTIN) 300 MG capsule Take 300 mg by mouth in the morning, at noon, in the evening, and at bedtime.     isosorbide mononitrate (  IMDUR) 60 MG 24 hr tablet Take 60 mg by mouth daily.     lansoprazole (PREVACID) 30 MG capsule Take 1 capsule (30 mg total) by mouth 2 (two) times daily before a meal. 60 capsule 3   Multiple Vitamin (MULTIVITAMIN) tablet Take 1 tablet by mouth daily.     nitroGLYCERIN (NITROSTAT) 0.4 MG SL tablet Place 1 tablet (0.4 mg total) under the tongue as needed for chest pain. 25 tablet 3   potassium chloride (KLOR-CON) 10 MEQ tablet Take 2 tablets (20 mEq total) by mouth 2 (two) times daily. 180 tablet 3   torsemide (DEMADEX) 20 MG tablet Take 3 tablets (60 mg total) by mouth 2  (two) times daily. 180 tablet 3   traZODone (DESYREL) 50 MG tablet Take 0.5 tablets (25 mg total) by mouth 2 (two) times daily. Take 25 tablets by mouth 2 (two) times daily.     vitamin B-12 (CYANOCOBALAMIN) 1000 MCG tablet Take 1,000 mcg by mouth daily.     vitamin C (ASCORBIC ACID) 500 MG tablet Take 500 mg by mouth daily.     No current facility-administered medications for this visit.    Allergies  Allergen Reactions   Omeprazole     doesn't work well with plavix    Other Palpitations    Certain steroids give patient heart palpitations  Allergy to green peppers    Social History   Socioeconomic History   Marital status: Married    Spouse name: Not on file   Number of children: Not on file   Years of education: Not on file   Highest education level: Not on file  Occupational History   Not on file  Tobacco Use   Smoking status: Every Day    Types: Cigarettes   Smokeless tobacco: Never   Tobacco comments:    8-9 cigarettes per day  Vaping Use   Vaping Use: Never used  Substance and Sexual Activity   Alcohol use: No   Drug use: No   Sexual activity: Not on file  Other Topics Concern   Not on file  Social History Narrative   Not on file   Social Determinants of Health   Financial Resource Strain: Not on file  Food Insecurity: Not on file  Transportation Needs: Not on file  Physical Activity: Not on file  Stress: Not on file  Social Connections: Not on file  Intimate Partner Violence: Not on file     Review of Systems: All other systems reviewed and are otherwise negative except as noted above.  Physical Exam: Vitals:   11/13/21 0915  BP: 110/70  Pulse: 70  SpO2: 95%  Weight: 193 lb (87.5 kg)  Height: 6\' 1"  (1.854 m)   Wt Readings from Last 3 Encounters:  11/13/21 193 lb (87.5 kg)  11/06/21 195 lb (88.5 kg)  09/16/21 189 lb (85.7 kg)    GEN- The patient is well appearing, alert and oriented x 3 today.   HEENT: normocephalic, atraumatic; sclera  clear, conjunctiva pink; hearing intact; oropharynx clear; neck supple, JVD nearly to jaw with + HJR. Lymph- no cervical lymphadenopathy Lungs- Clear to ausculation bilaterally, normal work of breathing.  No wheezes, rales, rhonchi Heart- Irregularly irergular rate and rhythm, no murmurs, rubs or gallops, PMI not laterally displaced GI- soft, non-tender, non-distended, bowel sounds present, no hepatosplenomegaly Extremities- no clubbing, cyanosis, or edema; DP/PT/radial pulses 2+ bilaterally MS- no significant deformity or atrophy Skin- warm and dry, no rash or lesion Psych- euthymic mood, full affect  Neuro- strength and sensation are intact  EKG is ordered. Personal review shows AF at 70 bpm  Additional studies reviewed include: Previous EP notes.   Assessment and Plan:  1. Acute on chronic systolic CHF Remains volume overloaded on exam with JVD nearly to jaw. Increase torsemide to 80 mg BID x 2 days, then back to 60 mg BID.  Educated on fluid and sodium restriction.  Stat labs today.   2. Persistent atrial fibrillation Remains out of rhythm and quite symptomatic I think we may need NSR to adequately diurese.  Adjust diuretic as along with fluid/sodium restriction.  A dose of Eliquis was held in preparation of ablation.  Will plan TEE/DCC week of 12/19 and close follow up after to adjust diuresis if needed.  Will keep appt in January with Dr. Quentin Ore to plan eventual repeat ablation attempt  Follow up with EP APP post TEE/DCC  Shirley Friar, PA-C  11/13/21 9:33 AM

## 2021-11-16 ENCOUNTER — Encounter (HOSPITAL_COMMUNITY): Payer: Self-pay | Admitting: Cardiovascular Disease

## 2021-11-17 ENCOUNTER — Other Ambulatory Visit: Payer: Medicare Other | Admitting: *Deleted

## 2021-11-17 ENCOUNTER — Other Ambulatory Visit: Payer: Self-pay

## 2021-11-17 DIAGNOSIS — I5021 Acute systolic (congestive) heart failure: Secondary | ICD-10-CM

## 2021-11-17 DIAGNOSIS — I4819 Other persistent atrial fibrillation: Secondary | ICD-10-CM

## 2021-11-17 LAB — BASIC METABOLIC PANEL
BUN/Creatinine Ratio: 13 (ref 10–24)
BUN: 20 mg/dL (ref 8–27)
CO2: 24 mmol/L (ref 20–29)
Calcium: 9.1 mg/dL (ref 8.6–10.2)
Chloride: 101 mmol/L (ref 96–106)
Creatinine, Ser: 1.53 mg/dL — ABNORMAL HIGH (ref 0.76–1.27)
Glucose: 122 mg/dL — ABNORMAL HIGH (ref 70–99)
Potassium: 4.3 mmol/L (ref 3.5–5.2)
Sodium: 137 mmol/L (ref 134–144)
eGFR: 47 mL/min/{1.73_m2} — ABNORMAL LOW (ref >59)

## 2021-11-23 ENCOUNTER — Ambulatory Visit (HOSPITAL_COMMUNITY): Payer: Medicare Other | Admitting: Anesthesiology

## 2021-11-23 ENCOUNTER — Other Ambulatory Visit: Payer: Self-pay

## 2021-11-23 ENCOUNTER — Ambulatory Visit (HOSPITAL_BASED_OUTPATIENT_CLINIC_OR_DEPARTMENT_OTHER)
Admission: RE | Admit: 2021-11-23 | Discharge: 2021-11-23 | Disposition: A | Discharge status: Home / Self Care | Payer: Medicare Other | Source: Home / Self Care | Attending: Student | Admitting: Student

## 2021-11-23 ENCOUNTER — Ambulatory Visit (HOSPITAL_COMMUNITY)
Admission: RE | Admit: 2021-11-23 | Discharge: 2021-11-23 | Disposition: A | Discharge status: Home / Self Care | Payer: Medicare Other | Attending: Cardiovascular Disease | Admitting: Cardiovascular Disease

## 2021-11-23 ENCOUNTER — Encounter (HOSPITAL_COMMUNITY)
Admission: RE | Disposition: A | Discharge status: Home / Self Care | Payer: Self-pay | Source: Home / Self Care | Attending: Cardiovascular Disease

## 2021-11-23 DIAGNOSIS — I4819 Other persistent atrial fibrillation: Secondary | ICD-10-CM | POA: Insufficient documentation

## 2021-11-23 DIAGNOSIS — I351 Nonrheumatic aortic (valve) insufficiency: Secondary | ICD-10-CM | POA: Diagnosis not present

## 2021-11-23 DIAGNOSIS — I5023 Acute on chronic systolic (congestive) heart failure: Secondary | ICD-10-CM | POA: Diagnosis not present

## 2021-11-23 DIAGNOSIS — I340 Nonrheumatic mitral (valve) insufficiency: Secondary | ICD-10-CM | POA: Diagnosis not present

## 2021-11-23 DIAGNOSIS — M48062 Spinal stenosis, lumbar region with neurogenic claudication: Secondary | ICD-10-CM | POA: Diagnosis not present

## 2021-11-23 DIAGNOSIS — Z79899 Other long term (current) drug therapy: Secondary | ICD-10-CM | POA: Diagnosis not present

## 2021-11-23 DIAGNOSIS — I700 Atherosclerosis of aorta: Secondary | ICD-10-CM | POA: Diagnosis not present

## 2021-11-23 DIAGNOSIS — I080 Rheumatic disorders of both mitral and aortic valves: Secondary | ICD-10-CM | POA: Diagnosis not present

## 2021-11-23 DIAGNOSIS — I110 Hypertensive heart disease with heart failure: Secondary | ICD-10-CM | POA: Insufficient documentation

## 2021-11-23 DIAGNOSIS — Z7901 Long term (current) use of anticoagulants: Secondary | ICD-10-CM | POA: Insufficient documentation

## 2021-11-23 DIAGNOSIS — I4891 Unspecified atrial fibrillation: Secondary | ICD-10-CM

## 2021-11-23 DIAGNOSIS — E1151 Type 2 diabetes mellitus with diabetic peripheral angiopathy without gangrene: Secondary | ICD-10-CM | POA: Diagnosis not present

## 2021-11-23 DIAGNOSIS — I252 Old myocardial infarction: Secondary | ICD-10-CM | POA: Diagnosis not present

## 2021-11-23 HISTORY — PX: CARDIOVERSION: SHX1299

## 2021-11-23 HISTORY — PX: TEE WITHOUT CARDIOVERSION: SHX5443

## 2021-11-23 SURGERY — ECHOCARDIOGRAM, TRANSESOPHAGEAL
Anesthesia: General

## 2021-11-23 MED ORDER — PROPOFOL 500 MG/50ML IV EMUL
INTRAVENOUS | Status: DC | PRN
  Administered 2021-11-23: 100 ug/kg/min via INTRAVENOUS

## 2021-11-23 MED ORDER — ONDANSETRON HCL 4 MG/2ML IJ SOLN: 4.0000 mg | Freq: Four times a day (QID) | INTRAMUSCULAR | Status: DC | PRN

## 2021-11-23 MED ORDER — PROPOFOL 10 MG/ML IV BOLUS
INTRAVENOUS | Status: DC | PRN
  Administered 2021-11-23: 20 mg via INTRAVENOUS

## 2021-11-23 MED ORDER — SODIUM CHLORIDE 0.9 % IV SOLN: INTRAVENOUS | Status: DC

## 2021-11-23 MED ORDER — PHENYLEPHRINE 40 MCG/ML (10ML) SYRINGE FOR IV PUSH (FOR BLOOD PRESSURE SUPPORT)
PREFILLED_SYRINGE | INTRAVENOUS | Status: DC | PRN
  Administered 2021-11-23: 20 ug via INTRAVENOUS
  Administered 2021-11-23 (×3): 120 ug via INTRAVENOUS

## 2021-11-23 MED ORDER — LIDOCAINE 2% (20 MG/ML) 5 ML SYRINGE
INTRAMUSCULAR | Status: DC | PRN
  Administered 2021-11-23: 60 mg via INTRAVENOUS

## 2021-11-23 NOTE — Progress Notes (Signed)
*  PRELIMINARY RESULTS* Echocardiogram Echocardiogram Transesophageal has been performed.  Gregory Shepherd 11/23/2021, 8:29 AM

## 2021-11-23 NOTE — Anesthesia Preprocedure Evaluation (Signed)
Anesthesia Evaluation  Patient identified by MRN, date of birth, ID band Patient awake    Reviewed: Allergy & Precautions, H&P , NPO status , Patient's Chart, lab work & pertinent test results  Airway Mallampati: II   Neck ROM: full    Dental   Pulmonary Current Smoker,    breath sounds clear to auscultation       Cardiovascular hypertension, + Past MI, + CABG and + Peripheral Vascular Disease  + dysrhythmias Atrial Fibrillation  Rhythm:irregular Rate:Normal  Stress test (09/2021): EF 30%, no reversible ischemia.   Neuro/Psych    GI/Hepatic GERD  ,  Endo/Other  diabetes  Renal/GU Renal InsufficiencyRenal disease     Musculoskeletal   Abdominal   Peds  Hematology   Anesthesia Other Findings   Reproductive/Obstetrics                             Anesthesia Physical Anesthesia Plan  ASA: 3  Anesthesia Plan: MAC   Post-op Pain Management:    Induction: Intravenous  PONV Risk Score and Plan: 0 and Propofol infusion and Treatment may vary due to age or medical condition  Airway Management Planned: Nasal Cannula  Additional Equipment:   Intra-op Plan:   Post-operative Plan:   Informed Consent: I have reviewed the patients History and Physical, chart, labs and discussed the procedure including the risks, benefits and alternatives for the proposed anesthesia with the patient or authorized representative who has indicated his/her understanding and acceptance.     Dental advisory given  Plan Discussed with: CRNA, Anesthesiologist and Surgeon  Anesthesia Plan Comments:         Anesthesia Quick Evaluation

## 2021-11-23 NOTE — Anesthesia Postprocedure Evaluation (Signed)
Anesthesia Post Note  Patient: TORSTEN WENIGER  Procedure(s) Performed: TRANSESOPHAGEAL ECHOCARDIOGRAM (TEE) CARDIOVERSION     Patient location during evaluation: PACU Anesthesia Type: General Level of consciousness: awake and alert Pain management: pain level controlled Vital Signs Assessment: post-procedure vital signs reviewed and stable Respiratory status: spontaneous breathing, nonlabored ventilation, respiratory function stable and patient connected to nasal cannula oxygen Cardiovascular status: stable and blood pressure returned to baseline Postop Assessment: no apparent nausea or vomiting Anesthetic complications: no   No notable events documented.  Last Vitals:  Vitals:   11/23/21 0833 11/23/21 0845  BP: (!) 113/43 (!) 111/47  Pulse: (!) 45 (!) 48  Resp: (!) 22 20  Temp:    SpO2: 92% 93%    Last Pain:  Vitals:   11/23/21 0826  TempSrc: Axillary  PainSc: 0-No pain                 Mariel Gaudin S

## 2021-11-23 NOTE — Anesthesia Procedure Notes (Addendum)
Procedure Name: General with mask airway Date/Time: 11/23/2021 8:04 AM Performed by: Rande Brunt, CRNA Pre-anesthesia Checklist: Patient identified, Emergency Drugs available, Suction available, Patient being monitored and Timeout performed Patient Re-evaluated:Patient Re-evaluated prior to induction Oxygen Delivery Method: Nasal cannula Preoxygenation: Pre-oxygenation with 100% oxygen Induction Type: IV induction Airway Equipment and Method: Bite block Placement Confirmation: positive ETCO2 and CO2 detector Dental Injury: Teeth and Oropharynx as per pre-operative assessment

## 2021-11-23 NOTE — Interval H&P Note (Signed)
History and Physical Interval Note:  11/23/2021 6:29 AM  Gregory Shepherd  has presented today for surgery, with the diagnosis of persistent afib.  The various methods of treatment have been discussed with the patient and family. After consideration of risks, benefits and other options for treatment, the patient has consented to  Procedure(s): TRANSESOPHAGEAL ECHOCARDIOGRAM (TEE) (N/A) CARDIOVERSION (N/A) as a surgical intervention.  The patient's history has been reviewed, patient examined, no change in status, stable for surgery.  I have reviewed the patient's chart and labs.  Questions were answered to the patient's satisfaction.     Mertie Moores

## 2021-11-23 NOTE — CV Procedure (Addendum)
° ° °  Transesophageal Echocardiogram  / Cardioversion Note   Gregory Shepherd [id number] [birth year]  Procedure: Transesophageal Echocardiogram Indications: Atrial fib  Procedure Details Consent: Obtained Time Out: Verified patient identification, verified procedure, site/side was marked, verified correct patient position, special equipment/implants available, Radiology Safety Procedures followed,  medications/allergies/relevent history reviewed, required imaging and test results available.  Performed  Medications:  During this procedure the patient is administered a propofol drip by Junie Bame, CRNA for sedation.  A total of 102 mg propofol was given.  Lidocaine 60 mg IV ,  neosynephrine. The patient's heart rate, blood pressure, and oxygen saturation are monitored continuously during the procedure. The period of conscious sedation is 45 minutes, of which I was present face-to-face 100% of this time.  Left Ventrical:  severely depressed LV function ,  EF ~ 25%  Mitral Valve: moderate MR  Aortic Valve: 3 leaflet valve,   mild AI   Tricuspid Valve: normal valve.  Trace TR   Pulmonic Valve: not well visualized.  Trace PI  Left Atrium/ Left atrial appendage:  very small, no thrombi   Atrial septum: no evidence of PFO or ASD by color doppler   Aorta: mild - moderate calcified plaque    Complications: No apparent complications Patient did tolerate procedure well.      Cardioversion Note  Gregory Shepherd [id number] [birth year]  Procedure: DC Cardioversion Indications: atrial fib   Procedure Details Consent: Obtained Time Out: Verified patient identification, verified procedure, site/side was marked, verified correct patient position, special equipment/implants available, Radiology Safety Procedures followed,  medications/allergies/relevent history reviewed, required imaging and test results available.  Performed  The patient has been on adequate anticoagulation.  The  patient received IV propofol drip ( see above)  for sedation.  Synchronous cardioversion was performed at 200  joules.  The cardioversion was successful.    Complications: No apparent complications Patient did tolerate procedure well.   Thayer Headings, Brooke Bonito., MD, [facility] 11/23/2021, 8:23 AM

## 2021-11-23 NOTE — Transfer of Care (Signed)
Immediate Anesthesia Transfer of Care Note  Patient: Gregory Shepherd  Procedure(s) Performed: TRANSESOPHAGEAL ECHOCARDIOGRAM (TEE) CARDIOVERSION  Patient Location: Endoscopy Unit  Anesthesia Type:General  Level of Consciousness: drowsy, patient cooperative and responds to stimulation  Airway & Oxygen Therapy: Patient Spontanous Breathing and Patient connected to face mask oxygen  Post-op Assessment: Report given to RN, Post -op Vital signs reviewed and stable and Patient moving all extremities X 4  Post vital signs: Reviewed and stable  Last Vitals:  Vitals Value Taken Time  BP 88/42 11/23/21 0823  Temp    Pulse 45 11/23/21 0824  Resp 16 11/23/21 0824  SpO2 94 % 11/23/21 0824  Vitals shown include unvalidated device data.  Last Pain:  Vitals:   11/23/21 0654  TempSrc: Oral  PainSc: 0-No pain         Complications: No notable events documented.

## 2021-11-25 ENCOUNTER — Ambulatory Visit: Payer: Medicare Other | Admitting: Student

## 2021-11-25 ENCOUNTER — Encounter (HOSPITAL_COMMUNITY): Payer: Self-pay | Admitting: Cardiovascular Disease

## 2021-11-26 NOTE — Progress Notes (Signed)
PCP:  Kirk Ruths, MD Primary Cardiologist: Quay Burow, MD Electrophysiologist: Vickie Epley, MD   Gregory Shepherd is a 75 y.o. male seen today for Vickie Epley, MD for acute visit due to acute on chronic systolic CHF .  Pt presented 11/06/2021 for AF ablation and was noted to be markedly volume overload with 2+ pitting edema to the knees and crackles. Started on amiodarone and torsemide increased with close follow up scheduled.   Underwent [facility] 12/19 to NSR, already feeling significantly better.   Today, feeling much better as above. No further chest discomfort in NSR. Moved pm dose of torsemide to afternoon and has been sleeping much better as well. Still has edema up to mid calf and mostly affecting his feet. Wearing compression hose daily. Keeps feet up when he can. Orthopnea also much improved.   Past Medical History:  Diagnosis Date   Carotid artery narrowing    History of blood clots    History of colon polyps    Hyperlipidemia    Hypertension    MI (myocardial infarction) [facility])    Past Surgical History:  Procedure Laterality Date   ABDOMINAL AORTOGRAM W/LOWER EXTREMITY N/A 06/04/2019   Procedure: ABDOMINAL AORTOGRAM W/LOWER EXTREMITY;  Surgeon: Lorretta Harp, MD;  Location: [place] CV LAB;  Service: Cardiovascular;  Laterality: N/A;   CARDIAC SURGERY     x2    CARDIOVERSION N/A 08/25/2021   Procedure: CARDIOVERSION;  Surgeon: Skeet Latch, MD;  Location: [place];  Service: Cardiovascular;  Laterality: N/A;   CARDIOVERSION N/A 11/23/2021   Procedure: CARDIOVERSION;  Surgeon: Thayer Headings, MD;  Location: [facility] ENDOSCOPY;  Service: Cardiovascular;  Laterality: N/A;   COLONOSCOPY     Age 40. [facility]   CORONARY ARTERY BYPASS GRAFT  1990   x 2 at [facility], redo x 3 @ [place] approx [place] Right 06/04/2019   Procedure: PERIPHERAL VASCULAR ATHERECTOMY;  Surgeon: Lorretta Harp, MD;   Location: [place] CV LAB;  Service: Cardiovascular;  Laterality: Right;  common iliac   PERIPHERAL VASCULAR INTERVENTION Right 06/04/2019   Procedure: PERIPHERAL VASCULAR INTERVENTION;  Surgeon: Lorretta Harp, MD;  Location: [place] CV LAB;  Service: Cardiovascular;  Laterality: Right;  common iliac   TEE WITHOUT CARDIOVERSION N/A 08/25/2021   Procedure: TRANSESOPHAGEAL ECHOCARDIOGRAM (TEE);  Surgeon: Skeet Latch, MD;  Location: [place];  Service: Cardiovascular;  Laterality: N/A;   TEE WITHOUT CARDIOVERSION N/A 11/23/2021   Procedure: TRANSESOPHAGEAL ECHOCARDIOGRAM (TEE);  Surgeon: Thayer Headings, MD;  Location: [facility] ENDOSCOPY;  Service: Cardiovascular;  Laterality: N/A;    Current Outpatient Medications  Medication Sig Dispense Refill   amiodarone (PACERONE) 200 MG tablet Take 2 tablets (400 mg) twice daily x 5 days, then 2 tablet (400 mg) daily x 5 days, then 1 tablet (200 mg) once daily. 60 tablet 6   apixaban (ELIQUIS) 5 MG TABS tablet Take 1 tablet (5 mg total) by mouth 2 (two) times daily. 180 tablet 0   aspirin 81 MG chewable tablet Chew 81 mg by mouth daily.     atorvastatin (LIPITOR) 80 MG tablet Take 80 mg by mouth daily.     BREO ELLIPTA 100-25 MCG/INH AEPB Inhale 1 puff into the lungs daily as needed (Shortness of breath).     carvedilol (COREG) 3.125 MG tablet Take 1 tablet (3.125 mg total) by mouth 2 (two) times daily. 60 tablet 6   cholecalciferol (VITAMIN D3) 25 MCG (  1000 UT) tablet Take 2,000 Units by mouth daily. Per patient taking 2,000 units daily     finasteride (PROSCAR) 5 MG tablet Take 1 tablet (5 mg total) by mouth daily. 90 tablet 0   gabapentin (NEURONTIN) 300 MG capsule Take 300 mg by mouth in the morning, at noon, in the evening, and at bedtime.     isosorbide mononitrate (IMDUR) 60 MG 24 hr tablet Take 60 mg by mouth daily.     lansoprazole (PREVACID) 30 MG capsule Take 1 capsule (30 mg total) by mouth 2 (two) times daily before a meal. 60  capsule 3   Melatonin 10 MG TABS Take by mouth at bedtime.     Multiple Vitamin (MULTIVITAMIN) tablet Take 1 tablet by mouth daily.     nitroGLYCERIN (NITROSTAT) 0.4 MG SL tablet Place 1 tablet (0.4 mg total) under the tongue as needed for chest pain. 25 tablet 3   potassium chloride (KLOR-CON) 10 MEQ tablet Take 2 tablets (20 mEq total) by mouth 2 (two) times daily. 180 tablet 3   torsemide (DEMADEX) 20 MG tablet Take 3 tablets (60 mg total) by mouth 2 (two) times daily. 180 tablet 3   traZODone (DESYREL) 50 MG tablet Take 0.5 tablets (25 mg total) by mouth 2 (two) times daily. Take 25 tablets by mouth 2 (two) times daily.     vitamin B-12 (CYANOCOBALAMIN) 1000 MCG tablet Take 1,000 mcg by mouth daily.     vitamin C (ASCORBIC ACID) 500 MG tablet Take 500 mg by mouth daily.     No current facility-administered medications for this visit.    Allergies  Allergen Reactions   Omeprazole     doesn't work well with plavix    Other Palpitations    Certain steroids give patient heart palpitations  Allergy to green peppers    Social History   Socioeconomic History   Marital status: Married    Spouse name: Not on file   Number of children: Not on file   Years of education: Not on file   Highest education level: Not on file  Occupational History   Not on file  Tobacco Use   Smoking status: Every Day    Types: Cigarettes   Smokeless tobacco: Never   Tobacco comments:    8-9 cigarettes per day  Vaping Use   Vaping Use: Never used  Substance and Sexual Activity   Alcohol use: No   Drug use: No   Sexual activity: Not on file  Other Topics Concern   Not on file  Social History Narrative   Not on file   Social Determinants of Health   Financial Resource Strain: Not on file  Food Insecurity: Not on file  Transportation Needs: Not on file  Physical Activity: Not on file  Stress: Not on file  Social Connections: Not on file  Intimate Partner Violence: Not on file     Review  of Systems: All other systems reviewed and are otherwise negative except as noted above.  Physical Exam: Vitals:   11/27/21 1011  BP: 108/62  Pulse: (!) 55  SpO2: 95%  Weight: 189 lb 6.4 oz (85.9 kg)  Height: 6' 1.5" (1.867 m)    Wt Readings from Last 3 Encounters:  11/27/21 189 lb 6.4 oz (85.9 kg)  11/23/21 192 lb 14.4 oz (87.5 kg)  11/13/21 193 lb (87.5 kg)    GEN- The patient is well appearing, alert and oriented x 3 today.   HEENT: normocephalic, atraumatic; sclera clear,  conjunctiva pink; hearing intact; oropharynx clear; neck supple, JVD nearly to jaw with + HJR. Lymph- no cervical lymphadenopathy Lungs- Clear to ausculation bilaterally, normal work of breathing.  No wheezes, rales, rhonchi Heart- Irregularly irergular rate and rhythm, no murmurs, rubs or gallops, PMI not laterally displaced GI- soft, non-tender, non-distended, bowel sounds present, no hepatosplenomegaly Extremities- no clubbing, cyanosis, or edema; DP/PT/radial pulses 2+ bilaterally MS- no significant deformity or atrophy Skin- warm and dry, no rash or lesion Psych- euthymic mood, full affect Neuro- strength and sensation are intact  EKG is ordered. Personal review shows AF at 70 bpm  Additional studies reviewed include: Previous EP notes.   Assessment and Plan:  1. Acute on chronic systolic CHF Volume status improving.  He has been taking torsemide 60 mg q am and 40 mg q pm. With CKD/AKI and now back in NSR, will decrease to 40 mg BID.  Previously educated on fluid and sodium restriction.  BMET today.  Suspect EF may improve now in NSR Need to consider low dose ACE/ARB pending labwork.  2. Persistent atrial fibrillation EKG today shows sinus bradycardia s/p [facility] 11/23/21 Continue Eliquis, encouraged no misses dose. A dose of Eliquis was held in preparation of ablation.  Will keep appt in January with Dr. Quentin Ore to discuss if we think re-attempting ablation is not risk prohibitive.  3. CAD  s/p CABG Chest discomfort resolved now in NSR Myoview 09/30/2021 with old scar and no obvious "reversible blockage" per Dr. Gwenlyn Found.   Follow up with EP APP post TEE/DCC  Shirley Friar, PA-C  11/27/21 10:15 AM

## 2021-11-27 ENCOUNTER — Encounter: Payer: Self-pay | Admitting: Student

## 2021-11-27 ENCOUNTER — Other Ambulatory Visit: Payer: Self-pay

## 2021-11-27 ENCOUNTER — Ambulatory Visit: Payer: Medicare Other | Admitting: Student

## 2021-11-27 VITALS — BP 108/62 | HR 55 | Ht 73.5 in | Wt 189.4 lb

## 2021-11-27 DIAGNOSIS — R072 Precordial pain: Secondary | ICD-10-CM

## 2021-11-27 DIAGNOSIS — I5042 Chronic combined systolic (congestive) and diastolic (congestive) heart failure: Secondary | ICD-10-CM | POA: Diagnosis not present

## 2021-11-27 DIAGNOSIS — I4819 Other persistent atrial fibrillation: Secondary | ICD-10-CM

## 2021-11-27 LAB — BASIC METABOLIC PANEL
BUN/Creatinine Ratio: 19 (ref 10–24)
BUN: 26 mg/dL (ref 8–27)
CO2: 27 mmol/L (ref 20–29)
Calcium: 8.6 mg/dL (ref 8.6–10.2)
Chloride: 103 mmol/L (ref 96–106)
Creatinine, Ser: 1.34 mg/dL — ABNORMAL HIGH (ref 0.76–1.27)
Glucose: 78 mg/dL (ref 70–99)
Potassium: 4 mmol/L (ref 3.5–5.2)
Sodium: 138 mmol/L (ref 134–144)
eGFR: 55 mL/min/{1.73_m2} — ABNORMAL LOW (ref >59)

## 2021-11-27 MED ORDER — TORSEMIDE 20 MG PO TABS: 40.0000 mg | ORAL_TABLET | Freq: Every day | ORAL | 3 refills | Status: DC

## 2021-11-27 MED ORDER — TORSEMIDE 40 MG PO TABS: 40.0000 mg | ORAL_TABLET | Freq: Every day | ORAL | 3 refills | Status: DC

## 2021-11-27 NOTE — Addendum Note (Signed)
Addended by: Carylon Perches on: 11/27/2021 10:39 AM   Modules accepted: Orders

## 2021-11-27 NOTE — Addendum Note (Signed)
Addended by: Ulice Brilliant T on: 11/27/2021 04:09 PM   Modules accepted: Orders

## 2021-11-27 NOTE — Patient Instructions (Signed)
Medication Instructions:  Your physician has recommended you make the following change in your medication:   DECREASE: Torsemide to 40mg  daily  *If you need a refill on your cardiac medications before your next appointment, please call your pharmacy*   Lab Work: TODAY: BMET - STAT  If you have labs (blood work) drawn today and your tests are completely normal, you will receive your results only by: [place] (if you have MyChart) OR A paper copy in the mail If you have any lab test that is abnormal or we need to change your treatment, we will call you to review the results.   Follow-Up: At [facility], you and your health needs are our priority.  As part of our continuing mission to provide you with exceptional heart care, we have created designated Provider Care Teams.  These Care Teams include your primary Cardiologist (physician) and Advanced Practice Providers (APPs -  Physician Assistants and Nurse Practitioners) who all work together to provide you with the care you need, when you need it.   Your next appointment:   As scheduled

## 2021-12-01 ENCOUNTER — Other Ambulatory Visit: Payer: Self-pay

## 2021-12-01 MED ORDER — TORSEMIDE 20 MG PO TABS: 40.0000 mg | ORAL_TABLET | Freq: Two times a day (BID) | ORAL | 3 refills | Status: DC

## 2021-12-03 ENCOUNTER — Ambulatory Visit (HOSPITAL_COMMUNITY): Payer: Medicare Other | Admitting: Nurse Practitioner

## 2021-12-04 ENCOUNTER — Other Ambulatory Visit: Payer: Self-pay

## 2021-12-04 DIAGNOSIS — I4819 Other persistent atrial fibrillation: Secondary | ICD-10-CM

## 2021-12-04 DIAGNOSIS — I5042 Chronic combined systolic (congestive) and diastolic (congestive) heart failure: Secondary | ICD-10-CM

## 2021-12-04 NOTE — Telephone Encounter (Signed)
I spoke with pt, he understands to take Torsemide 60mg  BID through the weekend. He is coming Tuesday morning for STAT labs and I told him we would call him with recommendations as soon as we get those results.  He agreed to go to the ED if his SOB or other sx worsens.  He will call his PCP to be seen ASAP for his rash.

## 2021-12-08 ENCOUNTER — Other Ambulatory Visit: Payer: Self-pay

## 2021-12-08 ENCOUNTER — Other Ambulatory Visit: Payer: Medicare Other | Admitting: *Deleted

## 2021-12-08 DIAGNOSIS — I4819 Other persistent atrial fibrillation: Secondary | ICD-10-CM

## 2021-12-08 DIAGNOSIS — D649 Anemia, unspecified: Secondary | ICD-10-CM

## 2021-12-08 DIAGNOSIS — R29898 Other symptoms and signs involving the musculoskeletal system: Secondary | ICD-10-CM | POA: Diagnosis not present

## 2021-12-08 DIAGNOSIS — I5042 Chronic combined systolic (congestive) and diastolic (congestive) heart failure: Secondary | ICD-10-CM

## 2021-12-08 DIAGNOSIS — I480 Paroxysmal atrial fibrillation: Secondary | ICD-10-CM | POA: Diagnosis not present

## 2021-12-08 DIAGNOSIS — R600 Localized edema: Secondary | ICD-10-CM | POA: Diagnosis not present

## 2021-12-08 LAB — BASIC METABOLIC PANEL
BUN/Creatinine Ratio: 13 (ref 10–24)
BUN: 18 mg/dL (ref 8–27)
CO2: 24 mmol/L (ref 20–29)
Calcium: 8.2 mg/dL — ABNORMAL LOW (ref 8.6–10.2)
Chloride: 102 mmol/L (ref 96–106)
Creatinine, Ser: 1.41 mg/dL — ABNORMAL HIGH (ref 0.76–1.27)
Glucose: 112 mg/dL — ABNORMAL HIGH (ref 70–99)
Potassium: 4.4 mmol/L (ref 3.5–5.2)
Sodium: 137 mmol/L (ref 134–144)
eGFR: 52 mL/min/{1.73_m2} — ABNORMAL LOW (ref >59)

## 2021-12-08 LAB — CBC
Hematocrit: 27.4 % — ABNORMAL LOW (ref 37.5–51.0)
Hemoglobin: 8.8 g/dL — ABNORMAL LOW (ref 13.0–17.7)
MCH: 23.5 pg — ABNORMAL LOW (ref 26.6–33.0)
MCHC: 32.1 g/dL (ref 31.5–35.7)
MCV: 73 fL — ABNORMAL LOW (ref 79–97)
Platelets: 223 10*3/uL (ref 150–450)
RBC: 3.74 x10E6/uL — ABNORMAL LOW (ref 4.14–5.80)
RDW: 18 % — ABNORMAL HIGH (ref 11.6–15.4)
WBC: 5.7 10*3/uL (ref 3.4–10.8)

## 2021-12-09 ENCOUNTER — Other Ambulatory Visit: Payer: Self-pay

## 2021-12-09 ENCOUNTER — Ambulatory Visit: Payer: Medicare Other | Admitting: Nurse Practitioner

## 2021-12-09 ENCOUNTER — Other Ambulatory Visit: Payer: Medicare Other

## 2021-12-09 ENCOUNTER — Other Ambulatory Visit (INDEPENDENT_AMBULATORY_CARE_PROVIDER_SITE_OTHER): Payer: Medicare Other

## 2021-12-09 ENCOUNTER — Telehealth: Payer: Self-pay

## 2021-12-09 ENCOUNTER — Encounter: Payer: Self-pay | Admitting: Nurse Practitioner

## 2021-12-09 VITALS — BP 112/50 | HR 60 | Ht 71.0 in | Wt 193.5 lb

## 2021-12-09 DIAGNOSIS — R5383 Other fatigue: Secondary | ICD-10-CM

## 2021-12-09 DIAGNOSIS — D649 Anemia, unspecified: Secondary | ICD-10-CM

## 2021-12-09 DIAGNOSIS — K921 Melena: Secondary | ICD-10-CM

## 2021-12-09 DIAGNOSIS — R42 Dizziness and giddiness: Secondary | ICD-10-CM

## 2021-12-09 DIAGNOSIS — I4819 Other persistent atrial fibrillation: Secondary | ICD-10-CM

## 2021-12-09 DIAGNOSIS — D509 Iron deficiency anemia, unspecified: Secondary | ICD-10-CM

## 2021-12-09 LAB — CBC
HCT: 27.7 % — ABNORMAL LOW (ref 39.0–52.0)
Hemoglobin: 8.7 g/dL — ABNORMAL LOW (ref 13.0–17.0)
MCHC: 31.4 g/dL (ref 30.0–36.0)
MCV: 74.5 fl — ABNORMAL LOW (ref 78.0–100.0)
Platelets: 197 10*3/uL (ref 150.0–400.0)
RBC: 3.72 Mil/uL — ABNORMAL LOW (ref 4.22–5.81)
RDW: 18.2 % — ABNORMAL HIGH (ref 11.5–15.5)
WBC: 5.4 10*3/uL (ref 4.0–10.5)

## 2021-12-09 MED ORDER — LANSOPRAZOLE 30 MG PO CPDR: 30.0000 mg | DELAYED_RELEASE_CAPSULE | Freq: Two times a day (BID) | ORAL | 3 refills | Status: DC

## 2021-12-09 NOTE — Patient Instructions (Addendum)
OVER THE COUNTER MEDICATION Please purchase the following medications over the counter and take as directed:  Ferrous Sulfate 325 MG tablet.   PROCEDURES: You have been scheduled for a EGD. Please follow the written instructions given to you at your visit today. If you use inhalers (even only as needed), please bring them with you on the day of your procedure.  You will be contacted by our office prior to your procedure for directions on holding your Eliquis.  If you do not hear from our office 1 week prior to your scheduled procedure, please call [phone] to discuss.   We will also obtain cardiac clearance from your cardiologist.  Go to the Emergency room if you develop chest pain, shortness of breath, dizziness or frequent loose black stool.  LABS:  Lab work has been ordered for you today. Our lab is located in the basement. Press "B" on the elevator. The lab is located at the first door on the left as you exit the elevator.  HEALTHCARE LAWS AND MY CHART RESULTS: Due to recent changes in healthcare laws, you may see the results of your imaging and laboratory studies on MyChart before your provider has had a chance to review them.   We understand that in some cases there may be results that are confusing or concerning to you. Not all laboratory results come back in the same time frame and the provider may be waiting for multiple results in order to interpret others.  Please give [place] 48 hours in order for your provider to thoroughly review all the results before contacting the office for clarification of your results.   It was great seeing you today! Thank you for entrusting me with your care and choosing [facility].  Noralyn Pick, CRNP

## 2021-12-09 NOTE — Telephone Encounter (Signed)
-----   Message from Thornton Park, MD sent at 12/09/2021  8:45 AM EST ----- Arleth Mccullar, Given the records that are available, it is unclear if he is having any GI bleeding. Please confirm to allow for appropriate triage. Please schedule next available with me or any APP. Thanks.  ----- Message ----- From: Carylon Perches, CMA Sent: 12/09/2021   7:51 AM EST To: Thornton Park, MD  Please call patient to schedule appointment. Hbg is trending down. Patient is aware that you will be contacting him.

## 2021-12-09 NOTE — Telephone Encounter (Signed)
Called pt to triage symptoms for possible GI bleed. LVM requesting returned call.

## 2021-12-09 NOTE — Telephone Encounter (Signed)
Orders placed per Colleen's request. Called pt and advised to have labs completed today approx 90 minutes BEFORE his appt to ensure we have results. Verbalized acceptance and understanding.

## 2021-12-09 NOTE — Telephone Encounter (Signed)
Patient will be seeing you on 12/18/21.

## 2021-12-09 NOTE — Progress Notes (Addendum)
12/09/2021 Gregory Shepherd [id number] 29-May-1946   Chief Complaint: Melena, anemia   History of Present Illness: Gregory Shepherd is a 76 year old male with a past medical history of hypertension, coronary artery disease s/p MI age 77, s/p 2 vessel CABG 1990 and redo 3 vessel CABG at [facility] 1998, s/p failed cardioversion 08/25/2021, s/p successful cardioversion 12/219/2022 on Eliquis, hyperlipidemia, claudication/peripheral vascular disease s/p atherectomy/PTA and covered stenting of the right common iliac artery 05/2019, cholelithiasis, BPH, DM II, H pylori blood antigen IgG elevated at 1.12, treated with antibiotics 2020 and colon polyps.  He was initially seen in our office by Dr. Tarri Glenn on 12/17/2020 for further evaluation regarding heartburn and epigastric pain unresponsive to PPI, Pepcid and Tums and to schedule a colon polyp surveillance colonoscopy. Records reviewed at that time showed he had an EGD and colonoscopy done with Dr. Gustavo Lah 10/19/2011.  Colonoscopy revealed 17 polyps out of which one was a tubular adenoma.  The EGD revealed reflux esophagitis, gastritis, duodenitis, and a sliding hiatal hernia.  He had moderate active esophagitis. He had a positive fit test in 2019.  He was seen by NP Jacqulyn Liner in the [facility] GI office but he declined any further evaluation due to prioritizing ongoing cardiac care. He attributed the positive test to blood from known hemorrhoids. Dr. Tarri Glenn recommended an EGD and colonoscopy after cardiac clearance received. These procedures were scheduled on 02/06/2021 but were not done, further details are unclear at this time.  He presents to our office today as referred by Dr. Ouida Sills for further evaluation regarding anemia and black colored stools. His Hg level has consistently dropped over the past 4 months. Hg 11.7 on 10.8 -> 9.6 on 11/13/2021 and  -> Hg 8.8 on 12/08/2020. He reports passing a solid stool with the first portion of the stool is black in  color and the remaining stool is darker brown which occurs intermittently over the past 6 months. He last took Eliquis at 8:15am today. On ASA 81mg  QD. No other NSAID use. No Pepto bismol or iron use. He occasionally sees a small amount of bright red blood on the toilet tissue which occurs once every 2 weeks if he strains to pass a BM.  He reports having history of hemorrhoids.  He denies having any dysphagia or heartburn.  No upper or lower abdominal pain.  He has intermittent mild dizziness.  No fever, sweats or chills.  No weight loss.  No chest pain or palpitations.  He has brief shortness of breath when he lays supine.  His lower extremity edema has worsened over the past few weeks.  He saw his PCP 1 or 2 days ago who applied a Ace wrap to his lower legs. He is on Torsemide 20mg  QD.   CBC Latest Ref Rng & Units 12/09/2021 12/08/2021 11/13/2021  WBC 4.0 - 10.5 K/uL 5.4 5.7 6.1  Hemoglobin 13.0 - 17.0 g/dL 8.7 Repeated and verified X2.(L) 8.8(L) 9.6(L)  Hematocrit 39.0 - 52.0 % 27.7(L) 27.4(L) 29.7(L)  Platelets 150.0 - 400.0 K/uL 197.0 223 192    CMP Latest Ref Rng & Units 12/08/2021 11/27/2021 11/17/2021  Glucose 70 - 99 mg/dL 112(H) 78 122(H)  BUN 8 - 27 mg/dL 18 26 20   Creatinine 0.76 - 1.27 mg/dL 1.41(H) 1.34(H) 1.53(H)  Sodium 134 - 144 mmol/L 137 138 137  Potassium 3.5 - 5.2 mmol/L 4.4 4.0 4.3  Chloride 96 - 106 mmol/L 102 103 101  CO2 20 - 29 mmol/L  24 27 24   Calcium 8.6 - 10.2 mg/dL 8.2(L) 8.6 9.1  Total Protein 6.5 - 8.1 g/dL - - -  Total Bilirubin 0.3 - 1.2 mg/dL - - -  Alkaline Phos 38 - 126 U/L - - -  AST 15 - 41 U/L - - -  ALT 0 - 44 U/L - - -     Past Medical History:  Diagnosis Date   Carotid artery narrowing    History of blood clots    History of colon polyps    Hyperlipidemia    Hypertension    MI (myocardial infarction) [facility])    Past Surgical History:  Procedure Laterality Date   ABDOMINAL AORTOGRAM W/LOWER EXTREMITY N/A 06/04/2019   Procedure: ABDOMINAL AORTOGRAM  W/LOWER EXTREMITY;  Surgeon: Lorretta Harp, MD;  Location: [place] [facility];  Service: Cardiovascular;  Laterality: N/A;   CARDIAC SURGERY     x2    CARDIOVERSION N/A 08/25/2021   Procedure: CARDIOVERSION;  Surgeon: Skeet Latch, MD;  Location: [place];  Service: Cardiovascular;  Laterality: N/A;   CARDIOVERSION N/A 11/23/2021   Procedure: CARDIOVERSION;  Surgeon: Thayer Headings, MD;  Location: [facility] ENDOSCOPY;  Service: Cardiovascular;  Laterality: N/A;   COLONOSCOPY     Age 63. [facility]   CORONARY ARTERY BYPASS GRAFT  1990   x 2 at [facility], redo x 3 @ [place] approx [place] Right 06/04/2019   Procedure: PERIPHERAL VASCULAR ATHERECTOMY;  Surgeon: Lorretta Harp, MD;  Location: [place] [facility];  Service: Cardiovascular;  Laterality: Right;  common iliac   PERIPHERAL VASCULAR INTERVENTION Right 06/04/2019   Procedure: PERIPHERAL VASCULAR INTERVENTION;  Surgeon: Lorretta Harp, MD;  Location: [place] [facility];  Service: Cardiovascular;  Laterality: Right;  common iliac   TEE WITHOUT CARDIOVERSION N/A 08/25/2021   Procedure: TRANSESOPHAGEAL ECHOCARDIOGRAM (TEE);  Surgeon: Skeet Latch, MD;  Location: [place];  Service: Cardiovascular;  Laterality: N/A;   TEE WITHOUT CARDIOVERSION N/A 11/23/2021   Procedure: TRANSESOPHAGEAL ECHOCARDIOGRAM (TEE);  Surgeon: Thayer Headings, MD;  Location: [facility] ENDOSCOPY;  Service: Cardiovascular;  Laterality: N/A;   Current Outpatient Medications on File Prior to Visit  Medication Sig Dispense Refill   amiodarone (PACERONE) 200 MG tablet Take 2 tablets (400 mg) twice daily x 5 days, then 2 tablet (400 mg) daily x 5 days, then 1 tablet (200 mg) once daily. 60 tablet 6   apixaban (ELIQUIS) 5 MG TABS tablet Take 1 tablet (5 mg total) by mouth 2 (two) times daily. 180 tablet 0   aspirin 81 MG chewable tablet Chew 81 mg by mouth daily.     atorvastatin (LIPITOR) 80 MG tablet Take 80 mg by  mouth daily.     BREO ELLIPTA 100-25 MCG/INH AEPB Inhale 1 puff into the lungs daily as needed (Shortness of breath).     carvedilol (COREG) 3.125 MG tablet Take 1 tablet (3.125 mg total) by mouth 2 (two) times daily. 60 tablet 6   cholecalciferol (VITAMIN D3) 25 MCG (1000 UT) tablet Take 2,000 Units by mouth daily. Per patient taking 2,000 units daily     finasteride (PROSCAR) 5 MG tablet Take 1 tablet (5 mg total) by mouth daily. 90 tablet 0   gabapentin (NEURONTIN) 300 MG capsule Take 300 mg by mouth in the morning, at noon, in the evening, and at bedtime.     isosorbide mononitrate (IMDUR) 60 MG 24 hr tablet Take 60 mg by mouth daily.  Melatonin 10 MG TABS Take by mouth at bedtime.     Multiple Vitamin (MULTIVITAMIN) tablet Take 1 tablet by mouth daily.     nitroGLYCERIN (NITROSTAT) 0.4 MG SL tablet Place 1 tablet (0.4 mg total) under the tongue as needed for chest pain. 25 tablet 3   potassium chloride (KLOR-CON) 10 MEQ tablet Take 2 tablets (20 mEq total) by mouth 2 (two) times daily. 180 tablet 3   torsemide (DEMADEX) 20 MG tablet Take 2 tablets (40 mg total) by mouth 2 (two) times daily. (Patient taking differently: Take 60 mg by mouth 2 (two) times daily.) 360 tablet 3   traZODone (DESYREL) 50 MG tablet Take 0.5 tablets (25 mg total) by mouth 2 (two) times daily. Take 25 tablets by mouth 2 (two) times daily.     vitamin B-12 (CYANOCOBALAMIN) 1000 MCG tablet Take 1,000 mcg by mouth daily.     vitamin C (ASCORBIC ACID) 500 MG tablet Take 500 mg by mouth daily.     No current facility-administered medications on file prior to visit.   Allergies  Allergen Reactions   Omeprazole     doesn't work well with plavix    Other Palpitations    Certain steroids give patient heart palpitations  Allergy to green peppers   Current Medications, Allergies, Past Medical History, Past Surgical History, Family History and Social History were reviewed in [employer]  record.  Review of Systems:   Constitutional: Negative for fever, sweats, chills or weight loss.  Respiratory: + SOB when laying supine.   Cardiovascular: + right and left leg swelling. No CP since cardioversion 11/23/2021. Gastrointestinal: See HPI.  Musculoskeletal: Negative for back pain or muscle aches.  Neurological: + Dizziness.  Physical Exam: BP (!) 112/50 (BP Location: Left Arm, Patient Position: Sitting, Cuff Size: Normal)    Pulse 60    Ht 5\' 11"  (1.803 m) Comment: height measured without shoes   Wt 193 lb 8 oz (87.8 kg)    BMI 26.99 kg/m  Wt Readings from Last 3 Encounters:  12/09/21 193 lb 8 oz (87.8 kg)  11/27/21 189 lb 6.4 oz (85.9 kg)  11/23/21 192 lb 14.4 oz (87.5 kg)    General: 76 year old male who appears frail in no acute distress. Head: Normocephalic and atraumatic. Eyes: No scleral icterus. Conjunctiva pink . Ears: Normal auditory acuity. Mouth: Dentition intact. No ulcers or lesions.  Lungs: Clear throughout to auscultation. Heart: Regular rate and rhythm, no murmur. Abdomen: Soft, nontender and nondistended. No masses or hepatomegaly. Normal bowel sounds x 4 quadrants.  Rectal: Noninflamed small external hemorrhoids.  Anal hemorrhoids without prolapse or active bleeding.  No blood or melena in the rectal vault.  Enlarged prostate when assessed on the left lateral position.  Sophia CMA present during exam. Musculoskeletal: Symmetrical with no gross deformities. Extremities: Lower extremities with 2+ pitting edema, bilateral Ace wraps in place Neurological: Alert oriented x 4. No focal deficits.  Psychological: Alert and cooperative. Normal mood and affect  Assessment and Recommendations:  69) 76 year old male with microcytic anemia, Hg level decreasing with intermittent partially black solid stools x 6 months on ASA and Eliquis. No GERD symptoms on Lansoprazole 30mg  QD. No abdominal pain. Hg 11.7 (08/19/2021) ->  Hg 10.8 -> Hg 9.6 (on 11/13/2021) -> Hg 8.8 (on  12/08/2020). Today Hg 8.7. MCV 74.5. No blood or melena in the rectal vault on exam today.  -Iron, iron saturation, TIBC and ferritin level -Start ferrous sulfate 325 mg 1 p.o. daily, patient  aware ferrous sulfate may turn the stool dark green or black -Increase Lansoprazole 30 mg p.o. twice daily, prescription renewed -E[facility] at [facility] benefits and risks discussed including risk with sedation, risk of bleeding, perforation and infection.  Cardiac clearance required prior to patient proceeding with EGD.  Cardiac clearance to also include Eliquis instructions prior to EGD.  EGD at [facility] to be scheduled with the first available physician once cardiac clearance received as discussed with Dr. Tarri Glenn -Colonoscopy deferred for now as he would unlikely tolerate the bowel prep at this juncture. See addendum below. -Repeat CBC in 1 week  -Patient informed to go to the ED if he develops chest pain, shortness of breath, dizziness or frequent loose black stools  2) History of H. Pylori IgG treated with antibiotics in 2020 -See plan in #1  3)  Colonoscopy revealed 17 polyps out of which one was a tubular adenoma.   -Colonoscopy deferred for now as noted above. See addendum below.  4) Coronary artery disease s/p MI age 75, s/p 2 vessel CABG 1990 and redo 3 vessel CABG at 1998  5) Chronic systolic and diastolic CHF. LV EF 25 - 30% on Torsemide.  Worsening lower extremity edema.  6) Atrial fibrillation s/p successful cardioversion 12/219/2022. Remains on Eliquis  7) PVD  8) DM II  ADDENDUM: Cardiac clearance received per Coletta Memos cardiology NP 12/18/2021: Chart reviewed as part of pre-operative protocol coverage. Given past medical history and time since last visit, based on ACC/AHA guidelines, Gregory Shepherd would be at acceptable risk for the planned procedure without further cardiovascular testing  Eliquis clearance received 12/18/2021: Per office protocol, patient can  hold Eliquis for 2 days prior to procedure.   Patient will not need bridging with Lovenox (enoxaparin) around procedure.  ADDENDUM: Patient elected to undergo a colonoscopy at the time of his EGD at [facility].

## 2021-12-09 NOTE — Telephone Encounter (Signed)
Pt returned call. Denies having any abd pain or cramping. Has noticed black tarry stools. Denies taking any iron supplements. Has also noticed increasing fatigue and dizziness. Denies any hematemesis and dyspnea. Given Hgb downward trend, pt has been scheduled for Urgent appt with Carl Best, NP today @ 3pm, arrive at 245pm. Routing this message to all parties to make them aware of this conversation and added appt.

## 2021-12-09 NOTE — Telephone Encounter (Signed)
[name] Pre-operative Risk Assessment     Gregory Shepherd 27-Apr-1946 [id number]  Procedure: EGD Anesthesia type:  MAC Procedure Date: 12/29/21 Provider: Dr. Ardis Hughs  Type of Clearance needed: Pharmacy/Cardiac/Medical We need full cardiac clearance prior to proceeding with procedure as well as instructions for holding Eliquis.  Medication(s) needing held: Eliquis   Length of time for medication to be held: 1-2 day  Please review request and advise by either responding to this message or by sending your response to the fax # provided below.  Thank you,  [facility]  Phone: [phone] Fax: [phone] ATTENTION: Airon Sahni,Certified Medical Assistant.

## 2021-12-10 LAB — IRON,TIBC AND FERRITIN PANEL
%SAT: 4 % (calc) — ABNORMAL LOW (ref 20–48)
Ferritin: 20 ng/mL — ABNORMAL LOW (ref 24–380)
Iron: 17 ug/dL — ABNORMAL LOW (ref 50–180)
TIBC: 395 mcg/dL (calc) (ref 250–425)

## 2021-12-15 ENCOUNTER — Encounter: Payer: Self-pay | Admitting: Cardiology

## 2021-12-15 ENCOUNTER — Other Ambulatory Visit: Payer: Self-pay

## 2021-12-15 ENCOUNTER — Ambulatory Visit: Payer: Medicare Other | Admitting: Cardiology

## 2021-12-15 VITALS — BP 98/64 | HR 53 | Ht 71.0 in | Wt 191.6 lb

## 2021-12-15 DIAGNOSIS — I4819 Other persistent atrial fibrillation: Secondary | ICD-10-CM | POA: Diagnosis not present

## 2021-12-15 DIAGNOSIS — I25708 Atherosclerosis of coronary artery bypass graft(s), unspecified, with other forms of angina pectoris: Secondary | ICD-10-CM

## 2021-12-15 DIAGNOSIS — I10 Essential (primary) hypertension: Secondary | ICD-10-CM

## 2021-12-15 DIAGNOSIS — I5042 Chronic combined systolic (congestive) and diastolic (congestive) heart failure: Secondary | ICD-10-CM | POA: Diagnosis not present

## 2021-12-15 DIAGNOSIS — Z951 Presence of aortocoronary bypass graft: Secondary | ICD-10-CM

## 2021-12-15 MED ORDER — IRON 325 (65 FE) MG PO TABS: 325.0000 mg | ORAL_TABLET | Freq: Two times a day (BID) | ORAL | 3 refills | Status: DC

## 2021-12-15 NOTE — Patient Instructions (Addendum)
Medication Instructions:  Increase Iron to 325 mg two times a day Your physician recommends that you continue on your current medications as directed. Please refer to the Current Medication list given to you today. *If you need a refill on your cardiac medications before your next appointment, please call your pharmacy*  Lab Work: None. If you have labs (blood work) drawn today and your tests are completely normal, you will receive your results only by: [place] (if you have MyChart) OR A paper copy in the mail If you have any lab test that is abnormal or we need to change your treatment, we will call you to review the results.  Testing/Procedures: None.  Follow-Up: At [facility], you and your health needs are our priority.  As part of our continuing mission to provide you with exceptional heart care, we have created designated Provider Care Teams.  These Care Teams include your primary Cardiologist (physician) and Advanced Practice Providers (APPs -  Physician Assistants and Nurse Practitioners) who all work together to provide you with the care you need, when you need it.  Your physician wants you to follow-up in: 6-8 weeks with  one of the following Advanced Practice Providers on your designated Care Team:    Legrand Como "Jonni Sanger" [place], [place] .  We recommend signing up for the patient portal called "MyChart".  Sign up information is provided on this After Visit Summary.  MyChart is used to connect with patients for Virtual Visits (Telemedicine).  Patients are able to view lab/test results, encounter notes, upcoming appointments, etc.  Non-urgent messages can be sent to your provider as well.   To learn more about what you can do with MyChart, go to [email or web address].    Any Other Special Instructions Will Be Listed Below (If Applicable).

## 2021-12-15 NOTE — Progress Notes (Signed)
Electrophysiology Office Follow up Visit Note:    Date:  12/15/2021   ID:  Gregory Shepherd, Gregory Shepherd 03/22/1946, MRN [id number]  PCP:  Kirk Ruths, MD  [place] Cardiologist:  Quay Burow, MD  [facility] Electrophysiologist:  Vickie Epley, MD    Interval History:    Gregory Shepherd is a 76 y.o. male who presents for a follow up visit. They were last seen in clinic October 12 by myself.  The patient then saw Oda Kilts on November 27, 2021 in follow-up.  I initially met with the patient to discuss atrial fibrillation ablation.  At her first appointment rhythm control was thought to be indicated because of his history of systolic heart failure.  When he presented for his ablation, he was in acutely decompensated chronic systolic heart failure and the procedure was canceled.  He continues to struggle with his volume status.  He is also being worked up for symptomatic anemia.  He has a planned endoscopy coming up soon.  He is with his wife today in clinic.       Past Medical History:  Diagnosis Date   Carotid artery narrowing    History of blood clots    History of colon polyps    Hyperlipidemia    Hypertension    MI (myocardial infarction) [facility])     Past Surgical History:  Procedure Laterality Date   ABDOMINAL AORTOGRAM W/LOWER EXTREMITY N/A 06/04/2019   Procedure: ABDOMINAL AORTOGRAM W/LOWER EXTREMITY;  Surgeon: Lorretta Harp, MD;  Location: [place] CV LAB;  Service: Cardiovascular;  Laterality: N/A;   CARDIAC SURGERY     x2    CARDIOVERSION N/A 08/25/2021   Procedure: CARDIOVERSION;  Surgeon: Skeet Latch, MD;  Location: [place];  Service: Cardiovascular;  Laterality: N/A;   CARDIOVERSION N/A 11/23/2021   Procedure: CARDIOVERSION;  Surgeon: Thayer Headings, MD;  Location: [facility] ENDOSCOPY;  Service: Cardiovascular;  Laterality: N/A;   COLONOSCOPY     Age 34. [facility]   CORONARY ARTERY BYPASS GRAFT  1990   x 2 at  [facility], redo x 3 @ [place] approx [place] Right 06/04/2019   Procedure: PERIPHERAL VASCULAR ATHERECTOMY;  Surgeon: Lorretta Harp, MD;  Location: [place] CV LAB;  Service: Cardiovascular;  Laterality: Right;  common iliac   PERIPHERAL VASCULAR INTERVENTION Right 06/04/2019   Procedure: PERIPHERAL VASCULAR INTERVENTION;  Surgeon: Lorretta Harp, MD;  Location: [place] CV LAB;  Service: Cardiovascular;  Laterality: Right;  common iliac   TEE WITHOUT CARDIOVERSION N/A 08/25/2021   Procedure: TRANSESOPHAGEAL ECHOCARDIOGRAM (TEE);  Surgeon: Skeet Latch, MD;  Location: [place];  Service: Cardiovascular;  Laterality: N/A;   TEE WITHOUT CARDIOVERSION N/A 11/23/2021   Procedure: TRANSESOPHAGEAL ECHOCARDIOGRAM (TEE);  Surgeon: Thayer Headings, MD;  Location: [facility] ENDOSCOPY;  Service: Cardiovascular;  Laterality: N/A;    Current Medications: Current Meds  Medication Sig   amiodarone (PACERONE) 200 MG tablet Take 2 tablets (400 mg) twice daily x 5 days, then 2 tablet (400 mg) daily x 5 days, then 1 tablet (200 mg) once daily.   apixaban (ELIQUIS) 5 MG TABS tablet Take 1 tablet (5 mg total) by mouth 2 (two) times daily.   aspirin 81 MG chewable tablet Chew 81 mg by mouth daily.   atorvastatin (LIPITOR) 80 MG tablet Take 80 mg by mouth daily.   BREO ELLIPTA 100-25 MCG/INH AEPB Inhale 1 puff into the lungs daily as needed (Shortness of breath).  carvedilol (COREG) 3.125 MG tablet Take 1 tablet (3.125 mg total) by mouth 2 (two) times daily.   cholecalciferol (VITAMIN D3) 25 MCG (1000 UT) tablet Take 2,000 Units by mouth daily. Per patient taking 2,000 units daily   finasteride (PROSCAR) 5 MG tablet Take 1 tablet (5 mg total) by mouth daily.   gabapentin (NEURONTIN) 300 MG capsule Take 300 mg by mouth in the morning, at noon, in the evening, and at bedtime.   isosorbide mononitrate (IMDUR) 60 MG 24 hr tablet Take 60 mg by mouth daily.   lansoprazole (PREVACID)  30 MG capsule Take 1 capsule (30 mg total) by mouth 2 (two) times daily before a meal.   Melatonin 10 MG TABS Take by mouth at bedtime.   Multiple Vitamin (MULTIVITAMIN) tablet Take 1 tablet by mouth daily.   nitroGLYCERIN (NITROSTAT) 0.4 MG SL tablet Place 1 tablet (0.4 mg total) under the tongue as needed for chest pain.   potassium chloride (KLOR-CON) 10 MEQ tablet Take 2 tablets (20 mEq total) by mouth 2 (two) times daily.   torsemide (DEMADEX) 20 MG tablet Take 60 mg by mouth 3 (three) times daily.   traZODone (DESYREL) 50 MG tablet Take 0.5 tablets (25 mg total) by mouth 2 (two) times daily. Take 25 tablets by mouth 2 (two) times daily.   vitamin B-12 (CYANOCOBALAMIN) 1000 MCG tablet Take 1,000 mcg by mouth daily.   vitamin C (ASCORBIC ACID) 500 MG tablet Take 500 mg by mouth daily.   [DISCONTINUED] Ferrous Sulfate (IRON) 325 (65 Fe) MG TABS Take 65 mg by mouth daily.     Allergies:   Omeprazole and Other   Social History   Socioeconomic History   Marital status: Married    Spouse name: Not on file   Number of children: Not on file   Years of education: Not on file   Highest education level: Not on file  Occupational History   Not on file  Tobacco Use   Smoking status: Every Day    Types: Cigarettes   Smokeless tobacco: Never   Tobacco comments:    8-9 cigarettes per day  Vaping Use   Vaping Use: Never used  Substance and Sexual Activity   Alcohol use: No   Drug use: No   Sexual activity: Not on file  Other Topics Concern   Not on file  Social History Narrative   Not on file   Social Determinants of Health   Financial Resource Strain: Not on file  Food Insecurity: Not on file  Transportation Needs: Not on file  Physical Activity: Not on file  Stress: Not on file  Social Connections: Not on file     Family History: The patient's family history includes Diabetes in his sister; Heart disease in his father and mother. There is no history of Prostate cancer,  Bladder Cancer, Kidney cancer, Colon cancer, or Esophageal cancer.  ROS:   Please see the history of present illness.    All other systems reviewed and are negative.  EKGs/Labs/Other Studies Reviewed:    The following studies were reviewed today:   EKG:  The ekg ordered today demonstrates sinus rhythm, suspected limb lead reversal  Recent Labs: 06/19/2021: B Natriuretic Peptide 430.2; TSH 2.699 11/13/2021: Magnesium 2.4 12/08/2021: BUN 18; Creatinine, Ser 1.41; Potassium 4.4; Sodium 137 12/09/2021: Hemoglobin 8.7 Repeated and verified X2.; Platelets 197.0  Recent Lipid Panel No results found for: CHOL, TRIG, HDL, CHOLHDL, VLDL, LDLCALC, LDLDIRECT  Physical Exam:    VS:  BP 98/64    Pulse (!) 53    Ht '5\' 11"'  (1.803 m)    Wt 191 lb 9.6 oz (86.9 kg)    SpO2 95%    BMI 26.72 kg/m     Wt Readings from Last 3 Encounters:  12/15/21 191 lb 9.6 oz (86.9 kg)  12/09/21 193 lb 8 oz (87.8 kg)  11/27/21 189 lb 6.4 oz (85.9 kg)     GEN:  no acute distress.  Chronically ill-appearing, elderly HEENT: Normal NECK: No JVD; No carotid bruits LYMPHATICS: No lymphadenopathy CARDIAC: RRR, no murmurs, rubs, gallops.  2+ pitting edema in bilateral lower extremities to the knees.  Tepid extremities. RESPIRATORY:  Clear to auscultation without rales, wheezing or rhonchi  ABDOMEN: Soft, non-tender, non-distended MUSCULOSKELETAL:  No deformity  SKIN: Warm and dry NEUROLOGIC:  Alert and oriented x 3 PSYCHIATRIC:  Normal affect        ASSESSMENT:    1. Chronic combined systolic and diastolic heart failure (HCC)   2. Persistent atrial fibrillation ([place])   3. Essential hypertension   4. 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 Hx of CABG    PLAN:    In order of problems listed above:  #Acute on chronic combined systolic and diastolic heart failure NYHA class III today.  Warm/tepid and volume overloaded on exam.  His  volume status continues to be a significant problem.  He is taking 60 mg of torsemide twice daily along with potassium.  He is having a significant intake of p.o. liquids which we discussed during today's visit.  He will continue on the current diuretic regimen and work on his volume intake.  He will continue his other medications.  His primary issues seem to be his chronic heart failure and symptomatic anemia at this point.    I would like to get him plugged into our advanced heart failure clinic to discuss additional management options for his chronic heart failure.  This is discussed at length with the patient during today's visit.  #Persistent atrial fibrillation Maintaining normal rhythm on amiodarone.  Is on 200 mg by mouth once daily.  Also takes Eliquis for stroke prophylaxis.  He is not an ablation candidate.  Would continue with amiodarone for now.  Can cardiovert in the future if he has return to atrial fibrillation.  #Hypertension Not an issue at this point.  Borderline hypotensive on his heart failure medical therapy.  #Coronary artery disease post CABG No ischemic symptoms.  #Symptomatic anemia Component of iron deficiency.  I have asked him to increase his iron supplementation to twice daily along with his vitamin C twice daily.  As we transition him for ongoing care in the heart failure clinic, I will put a follow-up in place with one of our APP's in about 6 to 8 weeks just to make sure that he has close follow-up during the transition.  At that appointment I would like to repeat a CMP, TSH and free T4 given his use of amiodarone.      Follow-up with EP on an as-needed basis.   Medication Adjustments/Labs and Tests Ordered: Current medicines are reviewed at length with the patient today.  Concerns regarding medicines are outlined above.  Orders Placed This Encounter  Procedures   AMB referral to CHF clinic   EKG 12-Lead   Meds ordered this encounter  Medications    Ferrous Sulfate (IRON) 325 (65 Fe) MG TABS  Sig: Take 1 tablet (325 mg total) by mouth in the morning and at bedtime.    Dispense:  180 tablet    Refill:  3     Signed, Lars Mage, MD, [facility], [facility] 12/15/2021 9:11 PM    Electrophysiology [facility]

## 2021-12-16 ENCOUNTER — Encounter: Payer: Self-pay | Admitting: Gastroenterology

## 2021-12-17 NOTE — Telephone Encounter (Signed)
Gregory Shepherd, Patient is scheduled now to see cardio in March. Do we need to cancel until he is seen and cleared?

## 2021-12-17 NOTE — Telephone Encounter (Signed)
Appt appears to have been canceled with Dr. Gwenlyn Found. Routing this message to [facility], CMA to follow up on status of clearance.

## 2021-12-18 ENCOUNTER — Other Ambulatory Visit: Payer: Self-pay

## 2021-12-18 ENCOUNTER — Telehealth: Payer: Self-pay | Admitting: Nurse Practitioner

## 2021-12-18 ENCOUNTER — Telehealth: Payer: Self-pay

## 2021-12-18 ENCOUNTER — Ambulatory Visit: Payer: Medicare Other | Admitting: Cardiovascular Disease

## 2021-12-18 ENCOUNTER — Encounter (HOSPITAL_COMMUNITY): Payer: Self-pay | Admitting: Gastroenterology

## 2021-12-18 ENCOUNTER — Encounter: Payer: Self-pay | Admitting: Cardiology

## 2021-12-18 DIAGNOSIS — Z86010 Personal history of colonic polyps: Secondary | ICD-10-CM

## 2021-12-18 DIAGNOSIS — D509 Iron deficiency anemia, unspecified: Secondary | ICD-10-CM

## 2021-12-18 DIAGNOSIS — K921 Melena: Secondary | ICD-10-CM

## 2021-12-18 NOTE — Telephone Encounter (Signed)
Gregory Shepherd, refer to  Iron/tibc/ferritin lab notes 12/10/2020.  Patient was due for repeat H&H on 12/16/2021 which was not done.  Please contact him and send him to our lab today to have a follow-up H&H done.

## 2021-12-18 NOTE — Telephone Encounter (Signed)
Clinical pharmacist to review Eliquis 

## 2021-12-18 NOTE — Telephone Encounter (Signed)
Gregory Shepherd, patient was seen by his cardiology EP specialist Dr. Quentin Ore 12/15/2021 with plans to send pt to the advanced heart failure clinic.   I'm not sure why he canceled his earlier appt with his general cardiologist Dr. Gwenlyn Found.  His EGD at  [facility] 12/29/2021 with Dr. Ardis Hughs (he had earlier availability for hospital procedure than Dr. Tarri Glenn at that time) was cancelled.  Dr. Tarri Glenn, EGD was a long hospital was canceled pending cardiac evaluation.  Patient was started on ferrous sulfate 325mg  QD and his EP cardiologist increased the ferrous sulfate to twice daily.  He was due for repeat H&H on 1/11 which was not done.  I sent Beth a separate message to have an H&H checked to make sure his anemia is not worsening.    Did you want to tentatively hold an EGD spot at [facility] March 2023 as we wait for cardiac clearance?

## 2021-12-18 NOTE — Telephone Encounter (Addendum)
[facility] Pre-operative Risk Assessment     Gregory Shepherd Aug 30, 1946 [id number]  Procedure: EGD/Colonoscopy Anesthesia type:  MAC Procedure Date: 02/08/22  Provider: Dr. Tarri Glenn  Please advise if this patient is cleared from a cardiac standpoint to have these procedures and if he can hold his Eliquis 2 days before.  Type of Clearance needed: Pharmacy/Cardiac/Medical  Medication(s) needing held: Eliquis   Length of time for medication to be held: 2  Please review request and advise by either responding to this message or by sending your response to the fax # provided below.  Thank you,  [facility]  Phone: [phone] Fax: [phone] ATTENTION: Gregory EVERSOLE, LPN

## 2021-12-18 NOTE — Telephone Encounter (Signed)
I have canceled the January procedure at [facility] and patient has been notified via MyChart we can not do any procedures until after March 13 Cardiology evaluation. Will have to find someone who is willing to do the double after March 13, it does not look like the schedule is open after the 13th yet.  Thornton Park, MD to Noralyn Pick, NP      8:44 AM  Yes. I agree with scheduling EGD in March while awaiting cardiac clearance.  I received a message about this patient yesterday when he requested doing a colonoscopy at the same time.  I am okay with that as long as he is able to hold any anticoagulation or antiplatelet therapy.  I also recommended that he continue the iron daily.  In my experience twice daily iron leads to significant side effects.  In addition, there is no data that suggest increased iron absorption.    Noralyn Pick, NP to Erenest Rasher, MD       8:18 AM Note Lenna Sciara, patient was seen by his cardiology EP specialist Dr. Quentin Ore 12/15/2021 with plans to send pt to the advanced heart failure clinic.    I'm not sure why he canceled his earlier appt with his general cardiologist Dr. Gwenlyn Found.  His EGD at  [facility] 12/29/2021 with Dr. Ardis Hughs (he had earlier availability for hospital procedure than Dr. Tarri Glenn at that time) was cancelled.   Dr. Tarri Glenn, EGD was a long hospital was canceled pending cardiac evaluation.  Patient was started on ferrous sulfate 325mg  QD and his EP cardiologist increased the ferrous sulfate to twice daily.   He was due for repeat H&H on 1/11 which was not done.  I sent Beth a separate message to have an H&H checked to make sure his anemia is not worsening.     Did you want to tentatively hold an EGD spot at [facility] March 2023 as we wait for cardiac clearance?

## 2021-12-18 NOTE — Telephone Encounter (Signed)
Spoke with the patient.  He said he forgot. It appears I did not put the order in either. Patient would like to know about the plan with getting the colonoscopy. He does not know if he is cleared by Cardiology or not. Will Melissa be sending a formal clearance request or has already?

## 2021-12-19 NOTE — Telephone Encounter (Signed)
Patient with diagnosis of atrial fibrillation on Eliquis for anticoagulation.    Procedure: EGD/colonoscopy Date of procedure: 02/08/22   CHA2DS2-VASc Score = 5   This indicates a 7.2% annual risk of stroke. The patient's score is based upon: CHF History: 1 HTN History: 1 Diabetes History: 0 Stroke History: 0 Vascular Disease History: 1 Age Score: 2 Gender Score: 0   CrCl 50  Platelet count 197  Per office protocol, patient can hold Eliquis for 2 days prior to procedure.   Patient will not need bridging with Lovenox (enoxaparin) around procedure.

## 2021-12-21 ENCOUNTER — Telehealth: Payer: Self-pay | Admitting: Gastroenterology

## 2021-12-21 ENCOUNTER — Other Ambulatory Visit (INDEPENDENT_AMBULATORY_CARE_PROVIDER_SITE_OTHER): Payer: Medicare Other

## 2021-12-21 DIAGNOSIS — D509 Iron deficiency anemia, unspecified: Secondary | ICD-10-CM | POA: Diagnosis not present

## 2021-12-21 LAB — HEMOGLOBIN AND HEMATOCRIT, BLOOD
HCT: 30.5 % — ABNORMAL LOW (ref 39.0–52.0)
Hemoglobin: 9.5 g/dL — ABNORMAL LOW (ref 13.0–17.0)

## 2021-12-21 NOTE — Telephone Encounter (Signed)
° °  Primary Cardiologist: Quay Burow, MD  Chart reviewed as part of pre-operative protocol coverage. Given past medical history and time since last visit, based on ACC/AHA guidelines, Gregory Shepherd would be at acceptable risk for the planned procedure without further cardiovascular testing.   Patient with diagnosis of atrial fibrillation on Eliquis for anticoagulation.     Procedure: EGD/colonoscopy Date of procedure: 02/08/22     CHA2DS2-VASc Score = 5   This indicates a 7.2% annual risk of stroke. The patient's score is based upon: CHF History: 1 HTN History: 1 Diabetes History: 0 Stroke History: 0 Vascular Disease History: 1 Age Score: 2 Gender Score: 0     CrCl 50             Platelet count 197   Per office protocol, patient can hold Eliquis for 2 days prior to procedure.   Patient will not need bridging with Lovenox (enoxaparin) around procedure.  I will route this recommendation to the requesting party via Epic fax function and remove from pre-op pool.  Please call with questions.    Jossie Ng. Wynter Grave NP-C    12/21/2021, 10:32 AM [address] [address] Office [phone] Fax [phone]

## 2021-12-22 NOTE — Telephone Encounter (Signed)
Melissa, thank you for keeping track of all this. Just to clarify, the patient did see his electrophysiologist cardiologist Dr. Quentin Ore on 12/15/2021 and his office note indicated he was sending the patient to the heart failure clinic. His primary cardiologist is Dr. Quay Burow. So the patient essentially has 3 cardiac teams. Melissa, I believe you sent cardiac clearance request to both Dr. Quentin Ore and Dr. Gwenlyn Found?  Let us know when cardiac clearance received. Thx

## 2021-12-22 NOTE — Telephone Encounter (Signed)
See other phone/Mychart note

## 2021-12-23 ENCOUNTER — Other Ambulatory Visit: Payer: Self-pay

## 2021-12-23 ENCOUNTER — Ambulatory Visit: Payer: Medicare Other | Admitting: Cardiology

## 2021-12-23 DIAGNOSIS — D649 Anemia, unspecified: Secondary | ICD-10-CM

## 2021-12-28 ENCOUNTER — Other Ambulatory Visit: Payer: Self-pay

## 2021-12-28 DIAGNOSIS — R600 Localized edema: Secondary | ICD-10-CM

## 2021-12-28 DIAGNOSIS — Z79899 Other long term (current) drug therapy: Secondary | ICD-10-CM

## 2021-12-28 DIAGNOSIS — I4819 Other persistent atrial fibrillation: Secondary | ICD-10-CM

## 2021-12-28 DIAGNOSIS — I5042 Chronic combined systolic (congestive) and diastolic (congestive) heart failure: Secondary | ICD-10-CM

## 2021-12-29 ENCOUNTER — Encounter (HOSPITAL_COMMUNITY): Payer: Self-pay

## 2021-12-29 ENCOUNTER — Ambulatory Visit (HOSPITAL_COMMUNITY): Admit: 2021-12-29 | Payer: Medicare Other | Admitting: Gastroenterology

## 2021-12-29 SURGERY — ESOPHAGOGASTRODUODENOSCOPY (EGD) WITH PROPOFOL
Anesthesia: Monitor Anesthesia Care

## 2021-12-30 ENCOUNTER — Other Ambulatory Visit: Payer: Self-pay

## 2021-12-30 ENCOUNTER — Other Ambulatory Visit: Payer: Medicare Other | Admitting: *Deleted

## 2021-12-30 DIAGNOSIS — I5042 Chronic combined systolic (congestive) and diastolic (congestive) heart failure: Secondary | ICD-10-CM

## 2021-12-30 DIAGNOSIS — I4819 Other persistent atrial fibrillation: Secondary | ICD-10-CM

## 2021-12-31 LAB — BASIC METABOLIC PANEL
BUN/Creatinine Ratio: 10 (ref 10–24)
BUN: 14 mg/dL (ref 8–27)
CO2: 25 mmol/L (ref 20–29)
Calcium: 9.1 mg/dL (ref 8.6–10.2)
Chloride: 101 mmol/L (ref 96–106)
Creatinine, Ser: 1.42 mg/dL — ABNORMAL HIGH (ref 0.76–1.27)
Glucose: 104 mg/dL — ABNORMAL HIGH (ref 70–99)
Potassium: 4 mmol/L (ref 3.5–5.2)
Sodium: 138 mmol/L (ref 134–144)
eGFR: 51 mL/min/{1.73_m2} — ABNORMAL LOW (ref >59)

## 2021-12-31 LAB — PRO B NATRIURETIC PEPTIDE: NT-Pro BNP: 3211 pg/mL — ABNORMAL HIGH (ref 0–486)

## 2022-01-06 ENCOUNTER — Other Ambulatory Visit (INDEPENDENT_AMBULATORY_CARE_PROVIDER_SITE_OTHER): Payer: Medicare Other

## 2022-01-06 DIAGNOSIS — D649 Anemia, unspecified: Secondary | ICD-10-CM | POA: Diagnosis not present

## 2022-01-06 LAB — CBC WITH DIFFERENTIAL/PLATELET
Basophils Absolute: 0.1 10*3/uL (ref 0.0–0.1)
Basophils Relative: 2.5 % (ref 0.0–3.0)
Eosinophils Absolute: 0.2 10*3/uL (ref 0.0–0.7)
Eosinophils Relative: 4.2 % (ref 0.0–5.0)
HCT: 37.2 % — ABNORMAL LOW (ref 39.0–52.0)
Hemoglobin: 11.6 g/dL — ABNORMAL LOW (ref 13.0–17.0)
Lymphocytes Relative: 12.8 % (ref 12.0–46.0)
Lymphs Abs: 0.7 10*3/uL (ref 0.7–4.0)
MCHC: 31.1 g/dL (ref 30.0–36.0)
MCV: 79.1 fl (ref 78.0–100.0)
Monocytes Absolute: 0.5 10*3/uL (ref 0.1–1.0)
Monocytes Relative: 9.5 % (ref 3.0–12.0)
Neutro Abs: 3.9 10*3/uL (ref 1.4–7.7)
Neutrophils Relative %: 71 % (ref 43.0–77.0)
Platelets: 222 10*3/uL (ref 150.0–400.0)
RBC: 4.7 Mil/uL (ref 4.22–5.81)
RDW: 24.8 % — ABNORMAL HIGH (ref 11.5–15.5)
WBC: 5.4 10*3/uL (ref 4.0–10.5)

## 2022-01-06 LAB — IRON,TIBC AND FERRITIN PANEL
%SAT: 9 % (calc) — ABNORMAL LOW (ref 20–48)
Ferritin: 52 ng/mL (ref 24–380)
Iron: 33 ug/dL — ABNORMAL LOW (ref 50–180)
TIBC: 357 mcg/dL (calc) (ref 250–425)

## 2022-01-07 ENCOUNTER — Other Ambulatory Visit: Payer: Self-pay

## 2022-01-07 DIAGNOSIS — D649 Anemia, unspecified: Secondary | ICD-10-CM

## 2022-01-07 DIAGNOSIS — K921 Melena: Secondary | ICD-10-CM

## 2022-01-11 ENCOUNTER — Other Ambulatory Visit: Payer: Self-pay | Admitting: Cardiovascular Disease

## 2022-01-11 DIAGNOSIS — I4819 Other persistent atrial fibrillation: Secondary | ICD-10-CM

## 2022-01-11 MED ORDER — TORSEMIDE 20 MG PO TABS: 40.0000 mg | ORAL_TABLET | Freq: Two times a day (BID) | ORAL | 3 refills | Status: DC

## 2022-01-11 NOTE — Telephone Encounter (Signed)
Shirley Friar, PA-C  [place]ina Winningham, Albina Billet, April, [place] The labwork 1/25 indicated that his kidney function was stable and his body still thinks he is in heart failure.   However, if he has continued to lose weight since then, I would cut back torsemide to 40 mg BID, and have him let us know if it starts to creep back up. Would continue potassium 20 meq BID for now, but will need repeat labwork in 7 days.   Can we also please move his appointment up from 3/13 to the week of 3/3?       Thank you   Let patient know to decrease his torsemide and continue potassium as is. Pt understands and agrees to plan. Pt scheduled to return for repeat labs 2/15 and follow-up appt has been moved per [place]'s request, to week of 3/3 (only available appt) and scheduled for 3/3 @12 :20.

## 2022-01-12 NOTE — Telephone Encounter (Addendum)
Prescription refill request for Eliquis received. Indication: afib  Last office visit: lambert, 12/15/2021 Scr: 1.42, 12/30/2021 Age: 76 yo  Weight: 86.9 kg   Pt is on the correct dose of Eliquis. Refill sent.

## 2022-01-14 ENCOUNTER — Ambulatory Visit (HOSPITAL_BASED_OUTPATIENT_CLINIC_OR_DEPARTMENT_OTHER)
Admission: RE | Admit: 2022-01-14 | Discharge: 2022-01-14 | Disposition: A | Discharge status: Home / Self Care | Payer: Medicare Other | Source: Ambulatory Visit | Attending: Cardiovascular Disease | Admitting: Cardiovascular Disease

## 2022-01-14 ENCOUNTER — Other Ambulatory Visit: Payer: Self-pay

## 2022-01-14 ENCOUNTER — Ambulatory Visit (HOSPITAL_COMMUNITY)
Admission: RE | Admit: 2022-01-14 | Discharge: 2022-01-14 | Disposition: A | Discharge status: Home / Self Care | Payer: Medicare Other | Source: Ambulatory Visit | Attending: Cardiovascular Disease | Admitting: Cardiovascular Disease

## 2022-01-14 DIAGNOSIS — Z95820 Peripheral vascular angioplasty status with implants and grafts: Secondary | ICD-10-CM | POA: Diagnosis not present

## 2022-01-14 DIAGNOSIS — I739 Peripheral vascular disease, unspecified: Secondary | ICD-10-CM

## 2022-01-20 ENCOUNTER — Other Ambulatory Visit: Payer: Medicare Other | Admitting: *Deleted

## 2022-01-20 ENCOUNTER — Other Ambulatory Visit: Payer: Self-pay

## 2022-01-20 DIAGNOSIS — I5042 Chronic combined systolic (congestive) and diastolic (congestive) heart failure: Secondary | ICD-10-CM | POA: Diagnosis not present

## 2022-01-20 DIAGNOSIS — I4819 Other persistent atrial fibrillation: Secondary | ICD-10-CM | POA: Diagnosis not present

## 2022-01-20 LAB — MAGNESIUM: Magnesium: 2.3 mg/dL (ref 1.6–2.3)

## 2022-01-20 LAB — BASIC METABOLIC PANEL
BUN/Creatinine Ratio: 12 (ref 10–24)
BUN: 16 mg/dL (ref 8–27)
CO2: 29 mmol/L (ref 20–29)
Calcium: 9.5 mg/dL (ref 8.6–10.2)
Chloride: 101 mmol/L (ref 96–106)
Creatinine, Ser: 1.38 mg/dL — ABNORMAL HIGH (ref 0.76–1.27)
Glucose: 103 mg/dL — ABNORMAL HIGH (ref 70–99)
Potassium: 4.3 mmol/L (ref 3.5–5.2)
Sodium: 137 mmol/L (ref 134–144)
eGFR: 53 mL/min/{1.73_m2} — ABNORMAL LOW (ref >59)

## 2022-01-29 ENCOUNTER — Encounter (HOSPITAL_COMMUNITY): Payer: Self-pay | Admitting: Gastroenterology

## 2022-01-29 ENCOUNTER — Telehealth: Payer: Self-pay

## 2022-01-29 NOTE — Telephone Encounter (Signed)
Procedure instructions sent via MyChart per patient request.

## 2022-02-03 ENCOUNTER — Other Ambulatory Visit (INDEPENDENT_AMBULATORY_CARE_PROVIDER_SITE_OTHER): Payer: Medicare Other

## 2022-02-03 DIAGNOSIS — K921 Melena: Secondary | ICD-10-CM

## 2022-02-03 DIAGNOSIS — D649 Anemia, unspecified: Secondary | ICD-10-CM

## 2022-02-03 LAB — CBC WITH DIFFERENTIAL/PLATELET
Basophils Absolute: 0.1 K/uL (ref 0.0–0.1)
Basophils Relative: 1.2 % (ref 0.0–3.0)
Eosinophils Absolute: 0.2 K/uL (ref 0.0–0.7)
Eosinophils Relative: 2.7 % (ref 0.0–5.0)
HCT: 40.5 % (ref 39.0–52.0)
Hemoglobin: 13 g/dL (ref 13.0–17.0)
Lymphocytes Relative: 11.5 % — ABNORMAL LOW (ref 12.0–46.0)
Lymphs Abs: 0.7 K/uL (ref 0.7–4.0)
MCHC: 32 g/dL (ref 30.0–36.0)
MCV: 81.3 fl (ref 78.0–100.0)
Monocytes Absolute: 0.6 K/uL (ref 0.1–1.0)
Monocytes Relative: 9.1 % (ref 3.0–12.0)
Neutro Abs: 4.8 K/uL (ref 1.4–7.7)
Neutrophils Relative %: 75.5 % (ref 43.0–77.0)
Platelets: 204 K/uL (ref 150.0–400.0)
RBC: 4.99 Mil/uL (ref 4.22–5.81)
RDW: 24.9 % — ABNORMAL HIGH (ref 11.5–15.5)
WBC: 6.3 K/uL (ref 4.0–10.5)

## 2022-02-03 LAB — IRON,TIBC AND FERRITIN PANEL
%SAT: 17 % (calc) — ABNORMAL LOW (ref 20–48)
Ferritin: 36 ng/mL (ref 24–380)
Iron: 51 ug/dL (ref 50–180)
TIBC: 299 mcg/dL (calc) (ref 250–425)

## 2022-02-05 ENCOUNTER — Ambulatory Visit: Payer: Medicare Other | Admitting: Student

## 2022-02-05 ENCOUNTER — Other Ambulatory Visit: Payer: Self-pay

## 2022-02-05 ENCOUNTER — Encounter: Payer: Self-pay | Admitting: Student

## 2022-02-05 VITALS — BP 106/50 | HR 50 | Ht 73.0 in | Wt 180.0 lb

## 2022-02-05 DIAGNOSIS — Z79899 Other long term (current) drug therapy: Secondary | ICD-10-CM | POA: Diagnosis not present

## 2022-02-05 DIAGNOSIS — I25708 Atherosclerosis of coronary artery bypass graft(s), unspecified, with other forms of angina pectoris: Secondary | ICD-10-CM

## 2022-02-05 DIAGNOSIS — I10 Essential (primary) hypertension: Secondary | ICD-10-CM | POA: Diagnosis not present

## 2022-02-05 DIAGNOSIS — I5042 Chronic combined systolic (congestive) and diastolic (congestive) heart failure: Secondary | ICD-10-CM | POA: Diagnosis not present

## 2022-02-05 DIAGNOSIS — I4819 Other persistent atrial fibrillation: Secondary | ICD-10-CM

## 2022-02-05 NOTE — Patient Instructions (Signed)

## 2022-02-05 NOTE — Progress Notes (Signed)
? ?PCP:  Kirk Ruths, MD ?Primary Cardiologist: Quay Burow, MD ?Electrophysiologist: Vickie Epley, MD  ? ?Gregory Shepherd is a 76 y.o. male seen today for Vickie Epley, MD for routine electrophysiology followup.  Since last being seen in our clinic the patient reports doing very well since being on iron. Has EGD/Colon next week.  he denies chest pain, palpitations, dyspnea, PND, orthopnea, nausea, vomiting, dizziness, syncope, edema, weight gain, or early satiety. ? ?Past Medical History:  ?Diagnosis Date  ? Carotid artery narrowing   ? History of blood clots   ? History of colon polyps   ? Hyperlipidemia   ? Hypertension   ? MI (myocardial infarction) ([place])   ? ?Past Surgical History:  ?Procedure Laterality Date  ? ABDOMINAL AORTOGRAM W/LOWER EXTREMITY N/A 06/04/2019  ? Procedure: ABDOMINAL AORTOGRAM W/LOWER EXTREMITY;  Surgeon: Lorretta Harp, MD;  Location: [place] [facility];  Service: Cardiovascular;  Laterality: N/A;  ? CARDIAC SURGERY    ? x2   ? CARDIOVERSION N/A 08/25/2021  ? Procedure: CARDIOVERSION;  Surgeon: Skeet Latch, MD;  Location: [place];  Service: Cardiovascular;  Laterality: N/A;  ? CARDIOVERSION N/A 11/23/2021  ? Procedure: CARDIOVERSION;  Surgeon: Thayer Headings, MD;  Location: [facility] ENDOSCOPY;  Service: Cardiovascular;  Laterality: N/A;  ? COLONOSCOPY    ? Age 82. [facility]  ? CORONARY ARTERY BYPASS GRAFT  1990  ? x 2 at [facility], redo x 3 @ [place] approx 1998  ? PERIPHERAL VASCULAR ATHERECTOMY Right 06/04/2019  ? Procedure: PERIPHERAL VASCULAR ATHERECTOMY;  Surgeon: Lorretta Harp, MD;  Location: [place] [facility];  Service: Cardiovascular;  Laterality: Right;  common iliac  ? PERIPHERAL VASCULAR INTERVENTION Right 06/04/2019  ? Procedure: PERIPHERAL VASCULAR INTERVENTION;  Surgeon: Lorretta Harp, MD;  Location: [place] [facility];  Service: Cardiovascular;  Laterality: Right;  common iliac  ? TEE WITHOUT CARDIOVERSION N/A 08/25/2021   ? Procedure: TRANSESOPHAGEAL ECHOCARDIOGRAM (TEE);  Surgeon: Skeet Latch, MD;  Location: [place];  Service: Cardiovascular;  Laterality: N/A;  ? TEE WITHOUT CARDIOVERSION N/A 11/23/2021  ? Procedure: TRANSESOPHAGEAL ECHOCARDIOGRAM (TEE);  Surgeon: Acie Fredrickson Wonda Cheng, MD;  Location: [place];  Service: Cardiovascular;  Laterality: N/A;  ? ? ?Current Outpatient Medications  ?Medication Sig Dispense Refill  ? amiodarone (PACERONE) 200 MG tablet Take 200 mg by mouth daily.    ? apixaban (ELIQUIS) 5 MG TABS tablet TAKE 1 TABLET BY MOUTH TWICE A DAY 180 tablet 1  ? aspirin 81 MG chewable tablet Chew 81 mg by mouth daily.    ? atorvastatin (LIPITOR) 80 MG tablet Take 80 mg by mouth daily.    ? BREO ELLIPTA 100-25 MCG/INH AEPB Inhale 1 puff into the lungs daily as needed (Shortness of breath).    ? carvedilol (COREG) 3.125 MG tablet Take 1 tablet (3.125 mg total) by mouth 2 (two) times daily. 60 tablet 6  ? Cholecalciferol (VITAMIN D) 50 MCG (2000 UT) CAPS Take 4,000 Units by mouth daily.    ? ferrous sulfate 325 (65 FE) MG tablet Take 325 mg by mouth every other day.    ? finasteride (PROSCAR) 5 MG tablet Take 1 tablet (5 mg total) by mouth daily. 90 tablet 0  ? gabapentin (NEURONTIN) 300 MG capsule Take 300 mg by mouth in the morning, at noon, in the evening, and at bedtime.    ? isosorbide mononitrate (IMDUR) 60 MG 24 hr tablet Take 60 mg by mouth daily.    ?  lansoprazole (PREVACID) 30 MG capsule Take 1 capsule (30 mg total) by mouth 2 (two) times daily before a meal. 60 capsule 3  ? Melatonin 10 MG TABS Take 10 mg by mouth at bedtime.    ? Multiple Vitamin (MULTIVITAMIN) tablet Take 1 tablet by mouth daily.    ? nitroGLYCERIN (NITROSTAT) 0.4 MG SL tablet Place 1 tablet (0.4 mg total) under the tongue as needed for chest pain. 25 tablet 3  ? potassium chloride (KLOR-CON) 10 MEQ tablet Take 2 tablets (20 mEq total) by mouth 2 (two) times daily. 180 tablet 3  ? torsemide (DEMADEX) 20 MG tablet Take 2 tablets  (40 mg total) by mouth 2 (two) times daily. 360 tablet 3  ? traZODone (DESYREL) 50 MG tablet Take 0.5 tablets (25 mg total) by mouth 2 (two) times daily. Take 25 tablets by mouth 2 (two) times daily. (Patient taking differently: Take 25 mg by mouth at bedtime.)    ? vitamin B-12 (CYANOCOBALAMIN) 1000 MCG tablet Take 1,000 mcg by mouth daily.    ? vitamin C (ASCORBIC ACID) 500 MG tablet Take 500 mg by mouth daily.    ? ?No current facility-administered medications for this visit.  ? ? ?Allergies  ?Allergen Reactions  ? Omeprazole   ?  doesn't work well with plavix   ? Other Palpitations  ?  Certain steroids give patient heart palpitations  ?Allergy to green peppers  ? ? ?Social History  ? ?Socioeconomic History  ? Marital status: Married  ?  Spouse name: Not on file  ? Number of children: Not on file  ? Years of education: Not on file  ? Highest education level: Not on file  ?Occupational History  ? Not on file  ?Tobacco Use  ? Smoking status: Every Day  ?  Types: Cigarettes  ? Smokeless tobacco: Never  ? Tobacco comments:  ?  8-9 cigarettes per day  ?Vaping Use  ? Vaping Use: Never used  ?Substance and Sexual Activity  ? Alcohol use: No  ? Drug use: No  ? Sexual activity: Not on file  ?Other Topics Concern  ? Not on file  ?Social History Narrative  ? Not on file  ? ?Social Determinants of Health  ? ?Financial Resource Strain: Not on file  ?Food Insecurity: Not on file  ?Transportation Needs: Not on file  ?Physical Activity: Not on file  ?Stress: Not on file  ?Social Connections: Not on file  ?Intimate Partner Violence: Not on file  ? ? ? ?Review of Systems: ?All other systems reviewed and are otherwise negative except as noted above. ? ?Physical Exam: ?Vitals:  ? 02/05/22 1222  ?BP: (!) 106/50  ?Pulse: (!) 50  ?SpO2: 96%  ?Weight: 180 lb (81.6 kg)  ?Height: 6\' 1"  (1.854 m)  ? ?Wt Readings from Last 3 Encounters:  ?02/05/22 180 lb (81.6 kg)  ?12/15/21 191 lb 9.6 oz (86.9 kg)  ?12/09/21 193 lb 8 oz (87.8 kg)   ? ? ? ?GEN- The patient is well appearing, alert and oriented x 3 today.   ?HEENT: normocephalic, atraumatic; sclera clear, conjunctiva pink; hearing intact; oropharynx clear; neck supple, no JVP ?Lymph- no cervical lymphadenopathy ?Lungs- Clear to ausculation bilaterally, normal work of breathing.  No wheezes, rales, rhonchi ?Heart- Regular rate and rhythm, no murmurs, rubs or gallops, PMI not laterally displaced ?GI- soft, non-tender, non-distended, bowel sounds present, no hepatosplenomegaly ?Extremities- no clubbing, cyanosis, or edema; DP/PT/radial pulses 2+ bilaterally ?MS- no significant deformity or atrophy ?Skin- warm and  dry, no rash or lesion ?Psych- euthymic mood, full affect ?Neuro- strength and sensation are intact ? ?EKG is not ordered. ? ?Additional studies reviewed include: ?Previous EP office notes.  ? ?Assessment and Plan: ? ?1. Chronic systolic CHF ?Echo 11/23/2021 LVEF 25-30% ?Volume status stable today.  ?Continue torsemide 40 mg BID.  ?Re-visited educated on fluid and sodium restriction.  ?Suspect EF may improve now in NSR ?Need to consider low dose ACE/ARB based on labwork.  ?Have referred to HF clinic.  ?  ?2. Persistent atrial fibrillation ?EKG 12/15/2021 showed bradycardia sinus bradycardia s/p [facility] 11/23/21 ?Continue Eliquis, encouraged no misses dose. A dose of Eliquis was held in preparation of ablation.  ?Will keep appt in January with Dr. Quentin Ore to discuss if we think re-attempting ablation is not risk prohibitive. ?  ?3. CAD s/p CABG ?Chest discomfort resolved now in NSR ?Myoview 09/30/2021 with old scar and no obvious "reversible blockage" per Dr. Gwenlyn Found. ? ?Follow up with EP APP in 6 months  ? ?Shirley Friar, PA-C  ?02/05/22 ?12:26 PM ?

## 2022-02-08 ENCOUNTER — Encounter (HOSPITAL_COMMUNITY)
Admission: RE | Disposition: A | Discharge status: Home / Self Care | Payer: Self-pay | Source: Home / Self Care | Attending: Gastroenterology

## 2022-02-08 ENCOUNTER — Ambulatory Visit (HOSPITAL_COMMUNITY)
Admission: RE | Admit: 2022-02-08 | Discharge: 2022-02-08 | Disposition: A | Discharge status: Home / Self Care | Payer: Medicare Other | Attending: Gastroenterology | Admitting: Gastroenterology

## 2022-02-08 ENCOUNTER — Encounter (HOSPITAL_COMMUNITY): Payer: Self-pay | Admitting: Gastroenterology

## 2022-02-08 ENCOUNTER — Ambulatory Visit (HOSPITAL_BASED_OUTPATIENT_CLINIC_OR_DEPARTMENT_OTHER): Payer: Medicare Other | Admitting: Certified Registered"

## 2022-02-08 ENCOUNTER — Other Ambulatory Visit: Payer: Self-pay

## 2022-02-08 ENCOUNTER — Ambulatory Visit (HOSPITAL_COMMUNITY): Payer: Medicare Other | Admitting: Certified Registered"

## 2022-02-08 DIAGNOSIS — D122 Benign neoplasm of ascending colon: Secondary | ICD-10-CM | POA: Insufficient documentation

## 2022-02-08 DIAGNOSIS — I10 Essential (primary) hypertension: Secondary | ICD-10-CM | POA: Diagnosis not present

## 2022-02-08 DIAGNOSIS — K3189 Other diseases of stomach and duodenum: Secondary | ICD-10-CM

## 2022-02-08 DIAGNOSIS — Z7901 Long term (current) use of anticoagulants: Secondary | ICD-10-CM | POA: Diagnosis not present

## 2022-02-08 DIAGNOSIS — D509 Iron deficiency anemia, unspecified: Secondary | ICD-10-CM

## 2022-02-08 DIAGNOSIS — D123 Benign neoplasm of transverse colon: Secondary | ICD-10-CM | POA: Insufficient documentation

## 2022-02-08 DIAGNOSIS — K648 Other hemorrhoids: Secondary | ICD-10-CM | POA: Diagnosis not present

## 2022-02-08 DIAGNOSIS — I252 Old myocardial infarction: Secondary | ICD-10-CM | POA: Diagnosis not present

## 2022-02-08 DIAGNOSIS — Z86010 Personal history of colonic polyps: Secondary | ICD-10-CM | POA: Insufficient documentation

## 2022-02-08 DIAGNOSIS — K2100 Gastro-esophageal reflux disease with esophagitis, without bleeding: Secondary | ICD-10-CM | POA: Diagnosis not present

## 2022-02-08 DIAGNOSIS — K635 Polyp of colon: Secondary | ICD-10-CM | POA: Diagnosis not present

## 2022-02-08 DIAGNOSIS — F17210 Nicotine dependence, cigarettes, uncomplicated: Secondary | ICD-10-CM | POA: Insufficient documentation

## 2022-02-08 DIAGNOSIS — K5730 Diverticulosis of large intestine without perforation or abscess without bleeding: Secondary | ICD-10-CM

## 2022-02-08 DIAGNOSIS — K449 Diaphragmatic hernia without obstruction or gangrene: Secondary | ICD-10-CM | POA: Insufficient documentation

## 2022-02-08 DIAGNOSIS — K2970 Gastritis, unspecified, without bleeding: Secondary | ICD-10-CM | POA: Diagnosis not present

## 2022-02-08 DIAGNOSIS — K2980 Duodenitis without bleeding: Secondary | ICD-10-CM | POA: Diagnosis not present

## 2022-02-08 DIAGNOSIS — Z951 Presence of aortocoronary bypass graft: Secondary | ICD-10-CM | POA: Insufficient documentation

## 2022-02-08 DIAGNOSIS — D500 Iron deficiency anemia secondary to blood loss (chronic): Secondary | ICD-10-CM

## 2022-02-08 DIAGNOSIS — K2950 Unspecified chronic gastritis without bleeding: Secondary | ICD-10-CM | POA: Insufficient documentation

## 2022-02-08 DIAGNOSIS — K921 Melena: Secondary | ICD-10-CM

## 2022-02-08 DIAGNOSIS — I2510 Atherosclerotic heart disease of native coronary artery without angina pectoris: Secondary | ICD-10-CM | POA: Insufficient documentation

## 2022-02-08 DIAGNOSIS — I4819 Other persistent atrial fibrillation: Secondary | ICD-10-CM

## 2022-02-08 HISTORY — PX: BIOPSY: SHX5522

## 2022-02-08 HISTORY — PX: ESOPHAGOGASTRODUODENOSCOPY (EGD) WITH PROPOFOL: SHX5813

## 2022-02-08 HISTORY — PX: COLONOSCOPY WITH PROPOFOL: SHX5780

## 2022-02-08 HISTORY — PX: POLYPECTOMY: SHX5525

## 2022-02-08 SURGERY — COLONOSCOPY WITH PROPOFOL
Anesthesia: Monitor Anesthesia Care

## 2022-02-08 MED ORDER — SODIUM CHLORIDE 0.9 % IV SOLN: INTRAVENOUS | Status: DC

## 2022-02-08 MED ORDER — PROPOFOL 500 MG/50ML IV EMUL
INTRAVENOUS | Status: DC | PRN
  Administered 2022-02-08: 125 ug/kg/min via INTRAVENOUS

## 2022-02-08 MED ORDER — PROPOFOL 500 MG/50ML IV EMUL
INTRAVENOUS | Status: AC
  Filled 2022-02-08: qty 50

## 2022-02-08 MED ORDER — PROPOFOL 1000 MG/100ML IV EMUL
INTRAVENOUS | Status: AC
  Filled 2022-02-08: qty 100

## 2022-02-08 MED ORDER — PROPOFOL 10 MG/ML IV BOLUS
INTRAVENOUS | Status: DC | PRN
Start: 2022-02-08 — End: 2022-02-08
  Administered 2022-02-08: 20 mg via INTRAVENOUS

## 2022-02-08 MED ORDER — LACTATED RINGERS IV SOLN: INTRAVENOUS | Status: DC

## 2022-02-08 MED ORDER — EPHEDRINE SULFATE-NACL 50-0.9 MG/10ML-% IV SOSY
PREFILLED_SYRINGE | INTRAVENOUS | Status: DC | PRN
Start: 2022-02-08 — End: 2022-02-08
  Administered 2022-02-08 (×3): 5 mg via INTRAVENOUS

## 2022-02-08 SURGICAL SUPPLY — 25 items

## 2022-02-08 NOTE — H&P (Addendum)
? ?Referring Provider: No ref. provider found ?Primary Care Physician:  Kirk Ruths, MD ? ?Reason for Procedures:  Anemia with suspected GI blood loss ? ? ?IMPRESSION:  ?Anemia with suspected GI blood loss ? ?PLAN: ?Colonoscopy and EGD ? ? ?HPI: Gregory Shepherd is a 76 y.o. male presents for endoscopic evaluation of microcytic anemia, Hg level decreasing with intermittent partially black solid stools x 6 months on ASA and Eliquis. No GERD symptoms on Lansoprazole '30mg'$  QD. No abdominal pain. Hg 11.7 (08/19/2021) ->  Hg 10.8 -> Hg 9.6 (on 11/13/2021) -> Hg 8.8 (on 12/08/2020). Today Hg 8.7. MCV 74.5.  ? ?He had an EGD and colonoscopy done with Dr. Gustavo Lah 10/19/2011.  Colonoscopy revealed 17 polyps out of which one was a tubular adenoma.  The EGD revealed reflux esophagitis, gastritis, duodenitis, and a sliding hiatal hernia.  He had moderate active esophagitis. He had a positive fit test in 2019.  He was seen by NP Jacqulyn Liner in the [facility] GI office but he declined any further evaluation due to prioritizing ongoing cardiac care. He attributed the positive test to blood from known hemorrhoids. I saw him in the office 12/2020 and recommended an EGD and colonoscopy after cardiac clearance received. These procedures were scheduled on 02/06/2021 but were not done. ? ?He was most recently seen in the office by Carl Best 12/09/21. Please see her note for additional details.  ? ? ?Past Medical History:  ?Diagnosis Date  ? Carotid artery narrowing   ? History of blood clots   ? History of colon polyps   ? Hyperlipidemia   ? Hypertension   ? MI (myocardial infarction) ([place])   ? ? ?Past Surgical History:  ?Procedure Laterality Date  ? ABDOMINAL AORTOGRAM W/LOWER EXTREMITY N/A 06/04/2019  ? Procedure: ABDOMINAL AORTOGRAM W/LOWER EXTREMITY;  Surgeon: Lorretta Harp, MD;  Location: [place] [facility];  Service: Cardiovascular;  Laterality: N/A;  ? CARDIAC SURGERY    ? x2   ? CARDIOVERSION N/A 08/25/2021  ?  Procedure: CARDIOVERSION;  Surgeon: Skeet Latch, MD;  Location: [place];  Service: Cardiovascular;  Laterality: N/A;  ? CARDIOVERSION N/A 11/23/2021  ? Procedure: CARDIOVERSION;  Surgeon: Thayer Headings, MD;  Location: [facility] ENDOSCOPY;  Service: Cardiovascular;  Laterality: N/A;  ? COLONOSCOPY    ? Age 42. [facility]  ? CORONARY ARTERY BYPASS GRAFT  1990  ? x 2 at [facility], redo x 3 @ [place] approx 1998  ? PERIPHERAL VASCULAR ATHERECTOMY Right 06/04/2019  ? Procedure: PERIPHERAL VASCULAR ATHERECTOMY;  Surgeon: Lorretta Harp, MD;  Location: [place] [facility];  Service: Cardiovascular;  Laterality: Right;  common iliac  ? PERIPHERAL VASCULAR INTERVENTION Right 06/04/2019  ? Procedure: PERIPHERAL VASCULAR INTERVENTION;  Surgeon: Lorretta Harp, MD;  Location: [place] [facility];  Service: Cardiovascular;  Laterality: Right;  common iliac  ? TEE WITHOUT CARDIOVERSION N/A 08/25/2021  ? Procedure: TRANSESOPHAGEAL ECHOCARDIOGRAM (TEE);  Surgeon: Skeet Latch, MD;  Location: [place];  Service: Cardiovascular;  Laterality: N/A;  ? TEE WITHOUT CARDIOVERSION N/A 11/23/2021  ? Procedure: TRANSESOPHAGEAL ECHOCARDIOGRAM (TEE);  Surgeon: Acie Fredrickson Wonda Cheng, MD;  Location: [place];  Service: Cardiovascular;  Laterality: N/A;  ? ? ?Current Facility-Administered Medications  ?Medication Dose Route Frequency Provider Last Rate Last Admin  ? 0.9 %  sodium chloride infusion   Intravenous Continuous Thornton Park, MD      ? lactated ringers infusion   Intravenous Continuous Thornton Park, MD      ? ? ?  Allergies as of 12/18/2021 - Review Complete 12/18/2021  ?Allergen Reaction Noted  ? Omeprazole  12/17/2020  ? Other Palpitations 05/28/2019  ? ? ?Family History  ?Problem Relation Age of Onset  ? Heart disease Mother   ? Heart disease Father   ? Diabetes Sister   ? Prostate cancer Neg Hx   ? Bladder Cancer Neg Hx   ? Kidney cancer Neg Hx   ? Colon cancer Neg Hx   ? Esophageal cancer Neg  Hx   ? ? ? ?Physical Exam: ?General:   Alert,  well-nourished, pleasant and cooperative in NAD ?Head:  Normocephalic and atraumatic. ?Eyes:  Sclera clear, no icterus.   Conjunctiva pink. ?Mouth:  No deformity or lesions.   ?Neck:  Supple; no masses or thyromegaly. ?Lungs:  Clear throughout to auscultation.   No wheezes. ?Heart:  Regular rate and rhythm; no murmurs. ?Abdomen:  Soft, non-tender, nondistended, normal bowel sounds, no rebound or guarding.  ?Msk:  Symmetrical. No boney deformities ?LAD: No inguinal or umbilical LAD ?Extremities:  No clubbing or edema. ?Neurologic:  Alert and  oriented x4;  grossly nonfocal ?Skin:  No obvious rash or bruise. ?Psych:  Alert and cooperative. Normal mood and affect. ? ? ? ? ?Studies/Results: ?No results found. ? ? ? ?Nahshon Reich L. Tarri Glenn, MD, MPH ?02/08/2022, 8:56 AM ? ? ? ?  ?

## 2022-02-08 NOTE — Anesthesia Preprocedure Evaluation (Addendum)
Anesthesia Evaluation  ?Patient identified by MRN, date of birth, ID band ?Patient awake ? ? ? ?Reviewed: ?Allergy & Precautions, NPO status , Patient's Chart, lab work & pertinent test results ? ?History of Anesthesia Complications ?Negative for: history of anesthetic complications ? ?Airway ?Mallampati: II ? ?TM Distance: >3 FB ?Neck ROM: Full ? ? ? Dental ?no notable dental hx. ? ?  ?Pulmonary ?Current Smoker and Patient abstained from smoking.,  ?  ?Pulmonary exam normal ? ? ? ? ? ? ? Cardiovascular ?hypertension, Pt. on medications ?+ CAD, + Past MI, + CABG (1990) and + Peripheral Vascular Disease  ?Normal cardiovascular exam+ dysrhythmias Atrial Fibrillation  ? ?TTE 11/2021: EF 25-30%, global hypokinesis, mild LVE, mildly reduced RV function, moderate MR, mild to moderate AR  ?  ?Neuro/Psych ?negative neurological ROS ? negative psych ROS  ? GI/Hepatic ?Neg liver ROS, GERD  Controlled and Medicated,  ?Endo/Other  ?negative endocrine ROS ? Renal/GU ?negative Renal ROS  ?negative genitourinary ?  ?Musculoskeletal ?negative musculoskeletal ROS ?(+)  ? Abdominal ?  ?Peds ? Hematology ?negative hematology ROS ?(+)   ?Anesthesia Other Findings ?Day of surgery medications reviewed with patient. ? Reproductive/Obstetrics ?negative OB ROS ? ?  ? ? ? ? ? ? ? ? ? ? ? ? ? ?  ?  ? ? ? ? ? ? ? ?Anesthesia Physical ?Anesthesia Plan ? ?ASA: 4 ? ?Anesthesia Plan: MAC  ? ?Post-op Pain Management: Minimal or no pain anticipated  ? ?Induction:  ? ?PONV Risk Score and Plan: Treatment may vary due to age or medical condition and Propofol infusion ? ?Airway Management Planned: Natural Airway and Nasal Cannula ? ?Additional Equipment: None ? ?Intra-op Plan:  ? ?Post-operative Plan:  ? ?Informed Consent: I have reviewed the patients History and Physical, chart, labs and discussed the procedure including the risks, benefits and alternatives for the proposed anesthesia with the patient or authorized  representative who has indicated his/her understanding and acceptance.  ? ? ? ? ? ?Plan Discussed with: CRNA ? ?Anesthesia Plan Comments:   ? ? ? ? ? ?Anesthesia Quick Evaluation ? ?

## 2022-02-08 NOTE — Anesthesia Procedure Notes (Signed)
Procedure Name: [place] ?Date/Time: 02/08/2022 9:41 AM ?Performed by: Niel Hummer, CRNA ?Pre-anesthesia Checklist: Patient identified, Emergency Drugs available, Suction available and Patient being monitored ?Oxygen Delivery Method: Simple face mask ? ? ? ? ?

## 2022-02-08 NOTE — Op Note (Addendum)
[facility] ?Patient Name: Gregory Shepherd ?Procedure Date: 02/08/2022 ?MRN: [id number] ?Attending MD: Thornton Park MD, MD ?Date of Birth: [birth year] ?CSN: [id number] ?Age: 76 ?Admit Type: Outpatient ?Procedure:                Upper GI endoscopy ?Indications:              Iron deficiency anemia secondary to chronic blood  ?                          loss, microcytic anemia, dark stools ?Providers:                Thornton Park MD, MD, Allayne Gitelman, RN, Elberta Fortis  ?                          Johnella Moloney, Technician ?Referring MD:              ?Medicines:                Monitored Anesthesia Care ?Complications:            No immediate complications. Estimated blood loss:  ?                          Minimal. ?Estimated Blood Loss:     Estimated blood loss was minimal. ?Procedure:                Pre-Anesthesia Assessment: ?                          - Prior to the procedure, a History and Physical  ?                          was performed, and patient medications and  ?                          allergies were reviewed. The patient's tolerance of  ?                          previous anesthesia was also reviewed. The risks  ?                          and benefits of the procedure and the sedation  ?                          options and risks were discussed with the patient.  ?                          All questions were answered, and informed consent  ?                          was obtained. Prior Anticoagulants: The patient has  ?                          taken Eliquis (apixaban), last dose was 2 days  ?  prior to procedure. ASA Grade Assessment: III - A  ?                          patient with severe systemic disease. After  ?                          reviewing the risks and benefits, the patient was  ?                          deemed in satisfactory condition to undergo the  ?                          procedure. ?                          After obtaining informed consent, the endoscope  was  ?                          passed under direct vision. Throughout the  ?                          procedure, the patient's blood pressure, pulse, and  ?                          oxygen saturations were monitored continuously. The  ?                          GIF-H190 ([id number]) Olympus endoscope was introduced  ?                          through the mouth, and advanced to the third part  ?                          of duodenum. The upper GI endoscopy was  ?                          accomplished without difficulty. The patient  ?                          tolerated the procedure well. ?Scope In: ?Scope Out: ?Findings: ?     The examined esophagus was normal. The z-line is located 36 cm from the  ?     incisors. ?     Diffuse nodular mucosa was found in the gastric fundus and in the  ?     gastric body. There are erythematous changes in the gastric body and  ?     antrum. Biopsies were taken from the antrum, body, and fundus with a  ?     cold forceps for histology. Estimated blood loss was minimal. ?     The examined duodenum was normal. Biopsies were taken with a cold  ?     forceps for histology. Estimated blood loss was minimal. ?     Small hiatal hernia. No Cameron's elsions. The cardia and gastric fundus  ?     were normal on retroflexion. ?     The exam was  otherwise without abnormality. ?Impression:               - Normal esophagus. ?                          - Nodular mucosa in the gastric fundus and in the  ?                          gastric body. Biopsied. ?                          - Small hiatal hernia. ?                          - Normal examined duodenum. Biopsied. ?                          - The examination was otherwise normal. ?Moderate Sedation: ?     Not Applicable - Patient had care per Anesthesia. ?Recommendation:           - Patient has a contact number available for  ?                          emergencies. The signs and symptoms of potential  ?                          delayed complications  were discussed with the  ?                          patient. Return to normal activities tomorrow.  ?                          Written discharge instructions were provided to the  ?                          patient. ?                          - Resume previous diet. ?                          - Continue present medications. ?                          - Await pathology results. ?                          - Proceed with colonoscopy as previously planned. ?Procedure Code(s):        --- Professional --- ?                          [phone], Esophagogastroduodenoscopy, flexible,  ?                          transoral; with biopsy, single or multiple ?Diagnosis Code(s):        --- Professional --- ?  K31.89, Other diseases of stomach and duodenum ?                          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 All rights reserved. ?The codes documented in this report are preliminary and upon coder review may  ?be revised to meet current compliance requirements. ?Thornton Park MD, MD ?02/08/2022 10:39:10 AM ?This report has been signed electronically. ?Number of Addenda: 0 ?

## 2022-02-08 NOTE — Discharge Instructions (Signed)
YOU HAD AN ENDOSCOPIC PROCEDURE TODAY: Refer to the procedure report and other information in the discharge instructions given to you for any specific questions about what was found during the examination. If this information does not answer your questions, please call [place] office at [phone] to clarify.  ? ?YOU SHOULD EXPECT: Some feelings of bloating in the abdomen. Passage of more gas than usual. Walking can help get rid of the air that was put into your GI tract during the procedure and reduce the bloating. ? ?DIET: Your first meal following the procedure should be a light meal and then it is ok to progress to your normal diet. A half-sandwich or bowl of soup is an example of a good first meal. Heavy or fried foods are harder to digest and may make you feel nauseous or bloated. Drink plenty of fluids but you should avoid alcoholic beverages for 24 hours. ? ?ACTIVITY: Your care partner should take you home directly after the procedure. You should plan to take it easy, moving slowly for the rest of the day. You can resume normal activity the day after the procedure however YOU SHOULD NOT DRIVE, use power tools, machinery or perform tasks that involve climbing or major physical exertion for 24 hours (because of the sedation medicines used during the test).  ? ?SYMPTOMS TO REPORT IMMEDIATELY: ?A gastroenterologist can be reached at any hour. Please call [phone]  for any of the following symptoms:  ?Following lower endoscopy (colonoscopy, flexible sigmoidoscopy) ?Excessive amounts of blood in the stool  ?Significant tenderness, worsening of abdominal pains  ?Swelling of the abdomen that is new, acute  ?Fever of 100? or higher  ?Following upper endoscopy (EGD, EUS, ERCP, esophageal dilation) ?Vomiting of blood or coffee ground material  ?New, significant abdominal pain  ?New, significant chest pain or pain under the shoulder blades  ?Painful or persistently difficult swallowing  ?New shortness of breath   ?Black, tarry-looking or red, bloody stools ? ?FOLLOW UP:  ?If any biopsies were taken you will be contacted by phone or by letter within the next 1-3 weeks. Call [phone]  if you have not heard about the biopsies in 3 weeks.  ?Please also call with any specific questions about appointments or follow up tests.  ?

## 2022-02-08 NOTE — Transfer of Care (Signed)
Immediate Anesthesia Transfer of Care Note ? ?Patient: Gregory Shepherd ? ?Procedure(s) Performed: COLONOSCOPY WITH PROPOFOL ?ESOPHAGOGASTRODUODENOSCOPY (EGD) WITH PROPOFOL ?BIOPSY ?POLYPECTOMY ? ?Patient Location: PACU ? ?Anesthesia Type:MAC ? ?Level of Consciousness: drowsy ? ?Airway & Oxygen Therapy: Patient Spontanous Breathing and Patient connected to face mask oxygen ? ?Post-op Assessment: Report given to RN, Post -op Vital signs reviewed and stable and Patient moving all extremities X 4 ? ?Post vital signs: Reviewed and stable ? ?Last Vitals:  ?Vitals Value Taken Time  ?BP 115/39   ?Temp    ?Pulse 41   ?Resp    ?SpO2 100   ? ? ?Last Pain:  ?Vitals:  ? 02/08/22 0913  ?TempSrc: Oral  ?PainSc: 0-No pain  ?   ? ?  ? ?Complications: No notable events documented. ?

## 2022-02-08 NOTE — Anesthesia Postprocedure Evaluation (Signed)
Anesthesia Post Note ? ?Patient: Gregory Shepherd ? ?Procedure(s) Performed: COLONOSCOPY WITH PROPOFOL ?ESOPHAGOGASTRODUODENOSCOPY (EGD) WITH PROPOFOL ?BIOPSY ?POLYPECTOMY ? ?  ? ?Patient location during evaluation: PACU ?Anesthesia Type: MAC ?Level of consciousness: awake and alert ?Pain management: pain level controlled ?Vital Signs Assessment: post-procedure vital signs reviewed and stable ?Respiratory status: spontaneous breathing, nonlabored ventilation and respiratory function stable ?Cardiovascular status: blood pressure returned to baseline ?Postop Assessment: no apparent nausea or vomiting ?Anesthetic complications: no ? ? ?No notable events documented. ? ?Last Vitals:  ?Vitals:  ? 02/08/22 1039 02/08/22 1049  ?BP: (!) 140/45 (!) 165/49  ?Pulse: (!) 43 (!) 47  ?Resp: 15 (!) 21  ?Temp:    ?SpO2: 98% 95%  ?  ?Last Pain:  ?Vitals:  ? 02/08/22 1049  ?TempSrc:   ?PainSc: 0-No pain  ? ? ?  ?  ?  ?  ?  ?  ? ?Marthenia Rolling ? ? ? ? ?

## 2022-02-08 NOTE — Op Note (Addendum)
[facility] ?Patient Name: Gregory Shepherd ?Procedure Date: 02/08/2022 ?MRN: [id number] ?Attending MD: Thornton Park MD, MD ?Date of Birth: May 08, 1946 ?CSN: [id number] ?Age: 76 ?Admit Type: Inpatient ?Procedure:                Colonoscopy ?Indications:              Iron deficiency anemia secondary to chronic blood  ?                          loss ?                          Colonoscopy with Dr. Gustavo Lah 10/19/11: 17 polyps,  ?                          one of which was an adenoma ?Providers:                Thornton Park MD, MD, Allayne Gitelman, RN, Elberta Fortis  ?                          Johnella Moloney, Technician ?Referring MD:              ?Medicines:                Monitored Anesthesia Care ?Complications:            No immediate complications. Estimated blood loss:  ?                          Minimal. ?Estimated Blood Loss:     Estimated blood loss was minimal. ?Procedure:                Pre-Anesthesia Assessment: ?                          - Prior to the procedure, a History and Physical  ?                          was performed, and patient medications and  ?                          allergies were reviewed. The patient's tolerance of  ?                          previous anesthesia was also reviewed. The risks  ?                          and benefits of the procedure and the sedation  ?                          options and risks were discussed with the patient.  ?                          All questions were answered, and informed consent  ?                          was obtained. Prior Anticoagulants: The patient has  ?  taken Eliquis (apixaban), last dose was 2 days  ?                          prior to procedure. ASA Grade Assessment: III - A  ?                          patient with severe systemic disease. After  ?                          reviewing the risks and benefits, the patient was  ?                          deemed in satisfactory condition to undergo the  ?                           procedure. ?                          After obtaining informed consent, the colonoscope  ?                          was passed under direct vision. Throughout the  ?                          procedure, the patient's blood pressure, pulse, and  ?                          oxygen saturations were monitored continuously. The  ?                          CF-HQ190L ([id number]) Olympus colonoscope was  ?                          introduced through the anus and advanced to the the  ?                          cecum, identified by appendiceal orifice and  ?                          ileocecal valve. A second forward view of the right  ?                          colon was performed. The colonoscopy was performed  ?                          without difficulty. The patient tolerated the  ?                          procedure well. The quality of the bowel  ?                          preparation was good. The ileocecal valve,  ?  appendiceal orifice, and rectum were photographed. ?Scope In: 10:09:30 AM ?Scope Out: 10:25:25 AM ?Scope Withdrawal Time: 0 hours 12 minutes 56 seconds  ?Total Procedure Duration: 0 hours 15 minutes 55 seconds  ?Findings: ?     Non-bleeding external and internal hemorrhoids were found. ?     Multiple small and large-mouthed diverticula were found in the sigmoid  ?     colon and descending colon. ?     A 3 mm polyp was found in the transverse colon. The polyp was sessile.  ?     The polyp was removed with a cold snare. Resection and retrieval were  ?     complete. Estimated blood loss was minimal. ?     A 5 mm polyp was found in the ascending colon. The polyp was sessile.  ?     The polyp was removed with a piecemeal technique using a cold snare.  ?     Resection and retrieval were complete. Estimated blood loss was minimal. ?     Multiple, small hyperplastic polyps were present in the rectum. These  ?     were not removed. The exam was otherwise without abnormality on direct  ?      and retroflexion views. ?Impression:               - Non-bleeding external and internal hemorrhoids. ?                          - Diverticulosis in the sigmoid colon and in the  ?                          descending colon. ?                          - One 3 mm polyp in the transverse colon, removed  ?                          with a cold snare. Resected and retrieved. ?                          - One 5 mm polyp in the ascending colon, removed  ?                          piecemeal using a cold snare. Resected and  ?                          retrieved. ?                          - The examination was otherwise normal on direct  ?                          and retroflexion views. ?Moderate Sedation: ?     Not Applicable - Patient had care per Anesthesia. ?Recommendation:           - Patient has a contact number available for  ?                          emergencies. The signs and symptoms of  potential  ?                          delayed complications were discussed with the  ?                          patient. Return to normal activities tomorrow.  ?                          Written discharge instructions were provided to the  ?                          patient. ?                          - Continue present medications. ?                          - Resume Eliquis 02/09/22. ?                          - Await pathology results. ?                          - Follow a high fiber diet. Drink at least 64  ?                          ounces of water daily. Add a daily stool bulking  ?                          agent such as psyllium (an exampled would be  ?                          Metamucil). ?                          - Consider small bowel evaluation if anemia  ?                          persists and gastric biopsies are non-diagnostic. ?                          - No surveillance colonoscopy recommended given age  ?                          >9. ?Procedure Code(s):        --- Professional --- ?                          [phone],  Colonoscopy, flexible; with removal of  ?                          tumor(s), polyp(s), or other lesion(s) by snare  ?                          technique ?Diagnosis Code(s):        --- Professional --- ?  K64.8, Other hemorrhoids ?                          K63.5, Polyp of colon ?                          D50.0, Iron deficiency anemia secondary to blood  ?                          loss (chronic) ?                          K57.30, Diverticulosis of large intestine without  ?                          perforation or abscess without bleeding ?CPT copyright 2019 American Medical Association. All rights reserved. ?The codes documented in this report are preliminary and upon coder review may  ?be revised to meet current compliance requirements. ?Thornton Park MD, MD ?02/08/2022 10:46:52 AM ?This report has been signed electronically. ?Number of Addenda: 0 ?

## 2022-02-09 ENCOUNTER — Other Ambulatory Visit: Payer: Self-pay

## 2022-02-09 ENCOUNTER — Encounter (HOSPITAL_COMMUNITY): Payer: Self-pay | Admitting: Gastroenterology

## 2022-02-09 DIAGNOSIS — D509 Iron deficiency anemia, unspecified: Secondary | ICD-10-CM

## 2022-02-09 DIAGNOSIS — D649 Anemia, unspecified: Secondary | ICD-10-CM

## 2022-02-09 DIAGNOSIS — K921 Melena: Secondary | ICD-10-CM

## 2022-02-09 DIAGNOSIS — R1013 Epigastric pain: Secondary | ICD-10-CM

## 2022-02-09 DIAGNOSIS — R5383 Other fatigue: Secondary | ICD-10-CM

## 2022-02-09 DIAGNOSIS — R42 Dizziness and giddiness: Secondary | ICD-10-CM

## 2022-02-11 LAB — SURGICAL PATHOLOGY

## 2022-02-11 NOTE — Progress Notes (Signed)
[facility] [place] Phone: [phone] Subjective:   Gregory Shepherd, am serving as a scribe for Dr. Hulan Shepherd.  This visit occurred during the SARS-CoV-2 public health emergency.  Safety protocols were in place, including screening questions prior to the visit, additional usage of staff PPE, and extensive cleaning of exam room while observing appropriate contact time as indicated for disinfecting solutions.    I'm seeing this patient by the request  of:  Gregory Ruths, MD  CC: Back pain leg numbness and arm numbness  AST:[id number]  Last seen 2020   Gregory Shepherd is a 76 y.o. male coming in with complaint of R leg and left hand numbness. Unable to hold items or button shirts due to numbness in L arm and hand. Cardiologist did do [place] of LUE and they do not feel that there is any occulusion.   Pain in the lumbar spine with numbness in R leg to foot. Unable to stand and cook for along period of time.  Patient went into Afib 9 months ago. Patient has also had severe anemia since last visit. Using compression stockings and taking dieretic.   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  Patient responded to 1 injection that did completely resolve the pain he was having on the left side.     Past Medical History:  Diagnosis Date   Carotid artery narrowing    History of blood clots    History of colon polyps    Hyperlipidemia    Hypertension    MI (myocardial infarction) [facility])    Past Surgical History:  Procedure Laterality Date   ABDOMINAL AORTOGRAM W/LOWER EXTREMITY N/A 06/04/2019   Procedure: ABDOMINAL AORTOGRAM W/LOWER EXTREMITY;  Surgeon: Lorretta Harp, MD;  Location: [place] [facility];  Service: Cardiovascular;  Laterality: N/A;   BIOPSY  02/08/2022   Procedure: BIOPSY;  Surgeon: Thornton Park, MD;  Location: [facility];  Service: Gastroenterology;;   CARDIAC SURGERY     x2    CARDIOVERSION N/A 08/25/2021   Procedure: CARDIOVERSION;  Surgeon: Skeet Latch, MD;  Location: [place];  Service: Cardiovascular;  Laterality: N/A;   CARDIOVERSION N/A 11/23/2021   Procedure: CARDIOVERSION;  Surgeon: Thayer Headings, MD;  Location: [facility] END[facility]COPY;  Service: Cardiovascular;  Laterality: N/A;   COLONOSCOPY     Age 62. [facility]   COLONOSCOPY WITH PROPOFOL N/A 02/08/2022   Procedure: COLONOSCOPY WITH PROPOFOL;  Surgeon: Thornton Park, MD;  Location: [facility];  Service: Gastroenterology;  Laterality: N/A;   CORONARY ARTERY BYPASS GRAFT  1990   x 2 at [facility], redo x 3 @ [place] approx 1998   ESOPHAGOGASTRODUODENOSCOPY (EGD) WITH PROPOFOL N/A 02/08/2022   Procedure: ESOPHAGOGASTRODUODENOSCOPY (EGD) WITH PROPOFOL;  Surgeon: Thornton Park, MD;  Location: [facility];  Service: Gastroenterology;  Laterality: N/A;   PERIPHERAL VASCULAR ATHERECTOMY Right 06/04/2019   Procedure: PERIPHERAL VASCULAR ATHERECTOMY;  Surgeon: Lorretta Harp, MD;  Location: [place] [facility];  Service: Cardiovascular;  Laterality: Right;  common iliac   PERIPHERAL VASCULAR INTERVENTION Right 06/04/2019   Procedure: PERIPHERAL VASCULAR INTERVENTION;  Surgeon: Lorretta Harp, MD;  Location: [place] [facility];  Service: Cardiovascular;  Laterality: Right;  common iliac  POLYPECTOMY  02/08/2022   Procedure: POLYPECTOMY;  Surgeon: Thornton Park, MD;  Location: [facility];  Service: Gastroenterology;;   TEE WITHOUT CARDIOVERSION N/A 08/25/2021   Procedure: TRANSESOPHAGEAL ECHOCARDIOGRAM (TEE);  Surgeon: Skeet Latch, MD;  Location: [place];  Service: Cardiovascular;  Laterality: N/A;   TEE WITHOUT CARDIOVERSION N/A 11/23/2021   Procedure:  TRANSESOPHAGEAL ECHOCARDIOGRAM (TEE);  Surgeon: Acie Fredrickson, Wonda Cheng, MD;  Location: [facility] ENDOSCOPY;  Service: Cardiovascular;  Laterality: N/A;   Social History   Socioeconomic History   Marital status: Married    Spouse name: Not on file   Number of children: Not on file   Years of education: Not on file   Highest education level: Not on file  Occupational History   Not on file  Tobacco Use   Smoking status: Every Day    Types: Cigarettes   Smokeless tobacco: Never   Tobacco comments:    8-9 cigarettes per day  Vaping Use   Vaping Use: Never used  Substance and Sexual Activity   Alcohol use: Shepherd   Drug use: Shepherd   Sexual activity: Not on file  Other Topics Concern   Not on file  Social History Narrative   Not on file   Social Determinants of Health   Financial Resource Strain: Not on file  Food Insecurity: Not on file  Transportation Needs: Not on file  Physical Activity: Not on file  Stress: Not on file  Social Connections: Not on file   Allergies  Allergen Reactions   Omeprazole     doesn't work well with plavix    Other Palpitations    Certain steroids give patient heart palpitations  Allergy to green peppers   Family History  Problem Relation Age of Onset   Heart disease Mother    Heart disease Father    Diabetes Sister    Prostate cancer Neg Hx    Bladder Cancer Neg Hx    Kidney cancer Neg Hx    Colon cancer Neg Hx    Esophageal cancer Neg Hx      Current Outpatient Medications (Cardiovascular):    amiodarone (PACERONE) 200 MG tablet, Take 200 mg by mouth daily.   atorvastatin (LIPITOR) 80 MG tablet, Take 80 mg by mouth daily.   carvedilol (COREG) 3.125 MG tablet, Take 1 tablet (3.125 mg total) by mouth 2 (two) times daily.   isosorbide mononitrate (IMDUR) 60 MG 24 hr tablet, Take 60 mg by mouth daily.   nitroGLYCERIN (NITROSTAT) 0.4 MG SL tablet, Place 1 tablet (0.4 mg total) under the tongue as needed for chest pain.   torsemide (DEMADEX) 20 MG tablet,  Take 2 tablets (40 mg total) by mouth 2 (two) times daily.  Current Outpatient Medications (Respiratory):    BREO ELLIPTA 100-25 MCG/INH AEPB, Inhale 1 puff into the lungs daily as needed (Shortness of breath).  Current Outpatient Medications (Analgesics):    aspirin 81 MG chewable tablet, Chew 81 mg by mouth daily.  Current Outpatient Medications (Hematological):    apixaban (ELIQUIS) 5 MG TABS tablet, TAKE 1 TABLET BY MOUTH TWICE A DAY   ferrous sulfate 325 (65 FE) MG tablet, Take 325 mg by mouth every other day.   vitamin B-12 (CYANOCOBALAMIN) 1000 MCG tablet, Take 1,000 mcg by mouth daily.  Current Outpatient Medications (Other):    Cholecalciferol (VITAMIN D) 50 MCG (2000 UT) CAPS, Take 4,000 Units by mouth daily.   finasteride (PROSCAR) 5 MG tablet, Take 1 tablet (5 mg total) by mouth daily.  gabapentin (NEURONTIN) 300 MG capsule, Take 300 mg by mouth in the morning, at noon, in the evening, and at bedtime.   lansoprazole (PREVACID) 30 MG capsule, Take 1 capsule (30 mg total) by mouth 2 (two) times daily before a meal.   Melatonin 10 MG TABS, Take 10 mg by mouth at bedtime.   Multiple Vitamin (MULTIVITAMIN) tablet, Take 1 tablet by mouth daily.   potassium chloride (KLOR-CON) 10 MEQ tablet, Take 2 tablets (20 mEq total) by mouth 2 (two) times daily.   traZODone (DESYREL) 50 MG tablet, Take 0.5 tablets (25 mg total) by mouth 2 (two) times daily. Take 25 tablets by mouth 2 (two) times daily. (Patient taking differently: Take 25 mg by mouth at bedtime.)   vitamin C (ASCORBIC ACID) 500 MG tablet, Take 500 mg by mouth daily.   Reviewed prior external information including notes and imaging from  primary care provider As well as notes that were available from care everywhere and other healthcare systems.  Past medical history, social, surgical and family history all reviewed in electronic medical record.  Shepherd pertanent information unless stated regarding to the chief complaint.    Review of Systems:  Shepherd headache, visual changes, nausea, vomiting, diarrhea, constipation, dizziness, abdominal pain, skin rash, fevers, chills, night sweats, weight loss, swollen lymph nodes, body aches, joint swelling, chest pain, shortness of breath, mood changes. POSITIVE muscle aches  Objective  Blood pressure (!) 110/54, pulse (!) 51, height '6\' 1"'$  (1.854 m), weight 179 lb (81.2 kg), SpO2 98 %.   General: Shepherd apparent distress alert and oriented x3 mood and affect normal, dressed appropriately.  HEENT: Pupils equal, extraocular movements intact  Respiratory: Patient's speak in full sentences and does not appear short of breath  Cardiovascular: Shepherd lower extremity edema, non tender, Shepherd erythema  Patient does have a fairly significant antalgic gait Upper extremity shows the patient's left hand does have fairly significant weakness but patient's rest of his arm seems to do relatively well.  Deep tendon reflexes do appear to be intact.  Patient has difficulty with intrinsic muscles for adduction of the fingers but does have relatively good strength of the AB duction. Grip strength is little weak on the left compared to the contralateral side.  Leg exam shows the patient does have some mild weakness noted of the right leg mostly with knee flexion.  Patient deep tendon reflexes 1+ at the patella but this is symmetric to the contralateral side.  Peripheral pulses are 1+ but symmetric.  Seems to be neurovascular intact distally.  Tenderness to palpation in the paraspinal musculature of the lumbar spine right greater than left.    Impression and Recommendations:     The above documentation has been reviewed and is accurate and complete Lyndal Pulley, DO

## 2022-02-12 ENCOUNTER — Other Ambulatory Visit: Payer: Self-pay

## 2022-02-12 ENCOUNTER — Ambulatory Visit (INDEPENDENT_AMBULATORY_CARE_PROVIDER_SITE_OTHER): Payer: Medicare Other

## 2022-02-12 ENCOUNTER — Ambulatory Visit: Payer: Medicare Other | Admitting: Family Medicine

## 2022-02-12 ENCOUNTER — Encounter: Payer: Self-pay | Admitting: Neurology

## 2022-02-12 VITALS — BP 110/54 | HR 51 | Ht 73.0 in | Wt 179.0 lb

## 2022-02-12 DIAGNOSIS — M545 Low back pain, unspecified: Secondary | ICD-10-CM

## 2022-02-12 DIAGNOSIS — M542 Cervicalgia: Secondary | ICD-10-CM | POA: Diagnosis not present

## 2022-02-12 DIAGNOSIS — R200 Anesthesia of skin: Secondary | ICD-10-CM | POA: Diagnosis not present

## 2022-02-12 DIAGNOSIS — M5416 Radiculopathy, lumbar region: Secondary | ICD-10-CM | POA: Diagnosis not present

## 2022-02-12 DIAGNOSIS — M47812 Spondylosis without myelopathy or radiculopathy, cervical region: Secondary | ICD-10-CM | POA: Diagnosis not present

## 2022-02-12 DIAGNOSIS — R29898 Other symptoms and signs involving the musculoskeletal system: Secondary | ICD-10-CM

## 2022-02-12 DIAGNOSIS — M47816 Spondylosis without myelopathy or radiculopathy, lumbar region: Secondary | ICD-10-CM | POA: Diagnosis not present

## 2022-02-12 DIAGNOSIS — M48062 Spinal stenosis, lumbar region with neurogenic claudication: Secondary | ICD-10-CM

## 2022-02-12 DIAGNOSIS — R202 Paresthesia of skin: Secondary | ICD-10-CM

## 2022-02-12 NOTE — Assessment & Plan Note (Signed)
Patient does have some left hand weakness noted.  Negative Tinel's sign noted.  Has had significant claudication of the peripheral vascular disease previously but with patient being on blood thinners highly unlikely at this moment.  Patient is already on gabapentin does not have any significant improvement at this time.  I would like to get a nerve conduction study to further evaluate if this is more carpal tunnel, cervical radiculopathy or something more central.  Patient was diagnosed with more of a atrial fibrillation and could consider the possibility of the CT head but with patient already being on a blood thinner, statin and being treated for his other comorbidities I do not think it would change medical management significantly.  Patient will follow-up with me again in 6 weeks otherwise. ?

## 2022-02-12 NOTE — Assessment & Plan Note (Signed)
Chronic problem but I do think patient is having exacerbation.  Still seems to be more on the right now than the left.  Consistent with the L4-L5 impingement that was noted on patient's MRI less than 3 years ago.  We will get further x-rays to rule out any bony abnormalities but I do think that this is likely going to help with another epidural.  Patient had that left side previously and would like one on the right side at this time.  Patient will likely have to hold his blood thinner which he has done for his colonoscopy.  Patient will follow-up with me again 6 weeks ?

## 2022-02-12 NOTE — Patient Instructions (Addendum)
Xray lumbar and cervical ?Epidural L4/L5 [phone] ?[facility] will call you ?See me in 6 weeks ?

## 2022-02-13 ENCOUNTER — Encounter: Payer: Self-pay | Admitting: Family Medicine

## 2022-02-15 ENCOUNTER — Ambulatory Visit: Payer: Medicare Other | Admitting: Student

## 2022-02-15 ENCOUNTER — Other Ambulatory Visit: Payer: Self-pay | Admitting: Cardiovascular Disease

## 2022-02-22 ENCOUNTER — Other Ambulatory Visit: Payer: Self-pay | Admitting: Cardiovascular Disease

## 2022-02-25 ENCOUNTER — Other Ambulatory Visit (HOSPITAL_COMMUNITY): Payer: Self-pay | Admitting: Cardiovascular Disease

## 2022-02-25 DIAGNOSIS — Z95828 Presence of other vascular implants and grafts: Secondary | ICD-10-CM

## 2022-03-02 ENCOUNTER — Ambulatory Visit
Admission: RE | Admit: 2022-03-02 | Discharge: 2022-03-02 | Disposition: A | Discharge status: Home / Self Care | Payer: Medicare Other | Source: Ambulatory Visit | Attending: Family Medicine | Admitting: Family Medicine

## 2022-03-02 ENCOUNTER — Other Ambulatory Visit: Payer: Self-pay

## 2022-03-02 DIAGNOSIS — M5116 Intervertebral disc disorders with radiculopathy, lumbar region: Secondary | ICD-10-CM | POA: Diagnosis not present

## 2022-03-02 DIAGNOSIS — M5416 Radiculopathy, lumbar region: Secondary | ICD-10-CM

## 2022-03-02 MED ORDER — METHYLPREDNISOLONE ACETATE 40 MG/ML INJ SUSP (RADIOLOG
80.0000 mg | Freq: Once | INTRAMUSCULAR | Status: AC
  Administered 2022-03-02: 80 mg via EPIDURAL

## 2022-03-02 MED ORDER — IOPAMIDOL (ISOVUE-M 200) INJECTION 41%
1.0000 mL | Freq: Once | INTRAMUSCULAR | Status: AC
Start: 2022-03-02 — End: 2022-03-02
  Administered 2022-03-02: 1 mL via EPIDURAL

## 2022-03-02 NOTE — Discharge Instructions (Addendum)
Post Procedure Spinal Discharge Instruction Sheet  You may resume a regular diet and any medications that you routinely take (including pain medications) unless otherwise noted by MD.  No driving day of procedure.  Light activity throughout the rest of the day.  Do not do any strenuous work, exercise, bending or lifting.  The day following the procedure, you can resume normal physical activity but you should refrain from exercising or physical therapy for at least three days thereafter.  You may apply ice to the injection site, 20 minutes on, 20 minutes off, as needed. Do not apply ice directly to skin.    Common Side Effects:  Headaches- take your usual medications as directed by your physician.  Increase your fluid intake.  Caffeinated beverages may be helpful.  Lie flat in bed until your headache resolves.  Restlessness or inability to sleep- you may have trouble sleeping for the next few days.  Ask your referring physician if you need any medication for sleep.  Facial flushing or redness- should subside within a few days.  Increased pain- a temporary increase in pain a day or two following your procedure is not unusual.  Take your pain medication as prescribed by your referring physician.  Leg cramps  Please contact our office at [phone] for the following symptoms: Fever greater than 100 degrees. Headaches unresolved with medication after 2-3 days. Increased swelling, pain, or redness at injection site.   Thank you for visiting [facility] today.   YOU MAY RESUME YOUR ASPIRIN ANYTIME AFTER PROCEDURE TODAY   YOU MAY RESUME YOUR ELIQUIS 24 HOURS AFTER PROCEDURE  

## 2022-03-03 ENCOUNTER — Ambulatory Visit: Payer: Medicare Other | Admitting: Nurse Practitioner

## 2022-03-03 ENCOUNTER — Encounter: Payer: Self-pay | Admitting: Nurse Practitioner

## 2022-03-03 VITALS — BP 120/60 | HR 49 | Ht 73.0 in | Wt 180.1 lb

## 2022-03-03 DIAGNOSIS — D509 Iron deficiency anemia, unspecified: Secondary | ICD-10-CM

## 2022-03-03 NOTE — Patient Instructions (Signed)
It has been recommended to you by your physician that you have a(n) small bowel capsule endoscopy completed. Per your request, we did not schedule the procedure(s) today. Please contact our office at [phone] should you decide to have the procedure completed. You will be scheduled for a pre-visit and procedure at that time. ? ?Repeat labs second week of April as previously ordered. You do not need an appointment for the lab. ? ?Reduce Lansoprazole to 30 MG once a day. ? ?Follow up with your primary care provider to consider a chest CT for lung cancer screening. ? ?Thank you for trusting me with your gastrointestinal care!   ? ?Noralyn Pick, CRNP ? ? ? ?BMI: ? ?If you are age 20 or older, your body mass index should be between 23-30. Your Body mass index is 23.76 kg/m?Marland Kitchen If this is out of the aforementioned range listed, please consider follow up with your Primary Care Provider. ? ?If you are age 74 or younger, your body mass index should be between 19-25. Your Body mass index is 23.76 kg/m?Marland Kitchen If this is out of the aformentioned range listed, please consider follow up with your Primary Care Provider.  ? ?MY CHART: ? ?The [place] [facility] providers would like to encourage you to use [facility] to communicate with providers for non-urgent requests or questions.  Due to long hold times on the telephone, sending your provider a message by [facility] may be a faster and more efficient way to get a response.  Please allow 48 business hours for a response.  Please remember that this is for non-urgent requests.  ? ? ?

## 2022-03-03 NOTE — Progress Notes (Signed)
? ? ? ?03/03/2022 ?Gregory Shepherd ?[id number] ?June 08, 1946 ? ? ?Chief Complaint: Anemia follow up ? ?History of Present Illness:  Gregory Shepherd is a 76 year old male with a past medical history of hypertension, coronary artery disease s/p MI age 1, s/p 2 vessel CABG 1990 and redo 3 vessel CABG at [facility], atrial fibrillation s/p failed cardioversion 08/25/2021, s/p successful cardioversion 12/219/2022 remains on Eliquis, hyperlipidemia, claudication/peripheral vascular disease s/p atherectomy/PTA and covered stenting of the right common iliac artery 05/2019, cholelithiasis, BPH, DM II, H pylori blood antigen IgG elevated at 1.12 treated in 2020 and colon polyps. ? ?I last saw Mr. Supinski in office on 12/09/2021 for further evaluation regarding IDA, intermittent semi  black stools and infrequent bright red rectal bleeding. Refer to this consult note for comprehensive history review.  Hg 11.7 on 10.8 -> 9.6 on 11/13/2021 and  -> Hg 8.8 on 12/08/2020. On Eliquis  He underwent an EGD and colonoscopy with Dr. Tarri Glenn on 02/08/2022. The EGD showed a small hiatal hernia and nodular mucosa in the gastric fundus and gastric body otherwise was normal. Gastric biopsies showed mild chronic gastritis and reactive gastropathy. Duodenal biopsies were negative for celiac disease. The colonoscopy identified two 3 and 65m tubular adenomatous polyps removed from the ascending and transverse colon, left sided diverticulosis and internal/external hemorrhoids. Dr. BTarri Glenndiscussed small bowel evaluation if his anemia persisted and if gastric biopsies were non diagnostic. No surveillance colonoscopy recommended given age >>59 ? ?He presents today for further anemia follow up. He is accompanied by his wife. He denies having any GERD symptoms. No further black colored stools. He continues to see a small amount of bright red blood on the toilet tissue once or twice weekly. No other complaints at this time. Hg level 13, iron 51 and iron  saturation 17%  on 02/03/2022. He remains on Ferrous Sulfate '325mg'$  every other day.  ? ?He recently developed bilateral LE edema which resolved. On Torsemide bid.  ? ?He continues to smoke cigarettes 1/2 ppd, started smoking at the age of 89  ? ? ?  Latest Ref Rng & Units 02/03/2022  ? 12:21 PM 01/06/2022  ? 11:07 AM 12/21/2021  ? 11:51 AM  ?CBC  ?WBC 4.0 - 10.5 K/uL 6.3   5.4     ?Hemoglobin 13.0 - 17.0 g/dL 13.0   11.6   9.5    ?Hematocrit 39.0 - 52.0 % 40.5   37.2   30.5    ?Platelets 150.0 - 400.0 K/uL 204.0   222.0     ?  ? ?  Latest Ref Rng & Units 01/20/2022  ?  1:47 PM 12/30/2021  ? 11:46 AM 12/08/2021  ? 12:44 PM  ?CMP  ?Glucose 70 - 99 mg/dL 103   104   112    ?BUN 8 - 27 mg/dL '16   14   18    '$ ?Creatinine 0.76 - 1.27 mg/dL 1.38   1.42   1.41    ?Sodium 134 - 144 mmol/L 137   138   137    ?Potassium 3.5 - 5.2 mmol/L 4.3   4.0   4.4    ?Chloride 96 - 106 mmol/L 101   101   102    ?CO2 20 - 29 mmol/L '29   25   24    '$ ?Calcium 8.6 - 10.2 mg/dL 9.5   9.1   8.2    ?  ? ?EGD 02/08/2022: ?- Normal esophagus. ?- Nodular mucosa in  the gastric fundus and in the gastric body. Biopsied. ?- Small hiatal hernia. ?- Normal examined duodenum. Biopsied. ?- The examination was otherwise normal. ? ?Colonoscopy 02/08/2022: ?- Non-bleeding external and internal hemorrhoids. ?- Diverticulosis in the sigmoid colon and in the descending colon. ?- One 3 mm polyp in the transverse colon, removed with a cold snare. Resected and ?retrieved. ?- One 5 mm polyp in the ascending colon, removed piecemeal using a cold snare. Resected ?and retrieved. ?- The examination was otherwise normal on direct and retroflexion views ? ?FINAL MICROSCOPIC DIAGNOSIS:  ? ?A. DUODENUM, BIOPSY:  ?- Duodenal mucosa with intact villous architecture.  ?- No villous atrophy or increased intraepithelial lymphocytes.  ? ?B. STOMACH, ANTRUM, BODY, FUNDUS, BIOPSY:  ?- Antral and oxyntic mucosa with mild chronic gastritis and changes  ?consistent with reactive gastropathy.  ?-  Immunohistochemistry for Helicobacter pylori negative.  ? ?C. COLON, TRANSVERSE, ASCENDING, BIOPSY:  ?- Tubular adenoma (s) without high grade dysplasia.  ? ?Current Outpatient Medications on File Prior to Visit  ?Medication Sig Dispense Refill  ? amiodarone (PACERONE) 200 MG tablet Take 200 mg by mouth daily.    ? apixaban (ELIQUIS) 5 MG TABS tablet TAKE 1 TABLET BY MOUTH TWICE A DAY 180 tablet 1  ? aspirin 81 MG chewable tablet Chew 81 mg by mouth daily.    ? atorvastatin (LIPITOR) 80 MG tablet Take 80 mg by mouth daily.    ? BREO ELLIPTA 100-25 MCG/INH AEPB Inhale 1 puff into the lungs daily as needed (Shortness of breath).    ? carvedilol (COREG) 3.125 MG tablet Take 1 tablet (3.125 mg total) by mouth 2 (two) times daily. 60 tablet 6  ? Cholecalciferol (VITAMIN D) 50 MCG (2000 UT) CAPS Take 4,000 Units by mouth daily.    ? ferrous sulfate 325 (65 FE) MG tablet Take 325 mg by mouth every other day.    ? finasteride (PROSCAR) 5 MG tablet Take 1 tablet (5 mg total) by mouth daily. 90 tablet 0  ? gabapentin (NEURONTIN) 300 MG capsule Take 300 mg by mouth in the morning, at noon, in the evening, and at bedtime.    ? isosorbide mononitrate (IMDUR) 60 MG 24 hr tablet Take 60 mg by mouth daily.    ? lansoprazole (PREVACID) 30 MG capsule Take 1 capsule (30 mg total) by mouth 2 (two) times daily before a meal. 60 capsule 3  ? Melatonin 10 MG TABS Take 10 mg by mouth at bedtime.    ? Multiple Vitamin (MULTIVITAMIN) tablet Take 1 tablet by mouth daily.    ? nitroGLYCERIN (NITROSTAT) 0.4 MG SL tablet Place 1 tablet (0.4 mg total) under the tongue as needed for chest pain. 25 tablet 3  ? potassium chloride (KLOR-CON) 10 MEQ tablet Take 2 tablets (20 mEq total) by mouth 2 (two) times daily. 180 tablet 3  ? torsemide (DEMADEX) 20 MG tablet Take 2 tablets (40 mg total) by mouth 2 (two) times daily. 360 tablet 3  ? traZODone (DESYREL) 50 MG tablet Take 0.5 tablets (25 mg total) by mouth 2 (two) times daily. Take 25 tablets by  mouth 2 (two) times daily. (Patient taking differently: Take 25 mg by mouth at bedtime.)    ? vitamin B-12 (CYANOCOBALAMIN) 1000 MCG tablet Take 1,000 mcg by mouth daily.    ? vitamin C (ASCORBIC ACID) 500 MG tablet Take 500 mg by mouth daily.    ? ?No current facility-administered medications on file prior to visit.  ? ?Allergies  ?Allergen  Reactions  ? Omeprazole   ?  doesn't work well with plavix   ? Other Palpitations  ?  Certain steroids give patient heart palpitations  ?Allergy to green peppers  ? ? ?Current Medications, Allergies, Past Medical History, Past Surgical History, Family History and Social History were reviewed in [employer] record. ? ? ?Review of Systems:   ?Constitutional: Negative for fever, sweats, chills or weight loss.  ?Respiratory: Negative for shortness of breath.   ?Cardiovascular: Negative for chest pain, palpitations and leg swelling.  ?Gastrointestinal: See HPI.  ?Musculoskeletal: Negative for back pain or muscle aches.  ?Neurological: Negative for dizziness, headaches or paresthesias.  ? ? ?Physical Exam: ?BP 120/60   Pulse (!) 49   Ht '6\' 1"'$  (1.854 m)   Wt 180 lb 2 oz (81.7 kg)   SpO2 98%   BMI 23.76 kg/m?  ? ?Wt Readings from Last 3 Encounters:  ?03/03/22 180 lb 2 oz (81.7 kg)  ?02/12/22 179 lb (81.2 kg)  ?02/08/22 176 lb (79.8 kg)  ?  ?General: 76 year old male in NAD.  ?Head: Normocephalic and atraumatic. ?Eyes: No scleral icterus. Conjunctiva pink . ?Ears: Normal auditory acuity. ?Mouth: Dentition intact. No ulcers or lesions.  ?Lungs: Clear throughout to auscultation. ?Heart: Regular rate and rhythm, no murmur. ?Abdomen: Soft, nontender and nondistended. No masses or hepatomegaly. Normal bowel sounds x 4 quadrants.  ?Rectal: Deferred  ?Musculoskeletal: Symmetrical with no gross deformities. ?Extremities: No edema. ?Neurological: Alert oriented x 4. No focal deficits.  ?Psychological: Alert and cooperative. Normal mood and affect ? ?Assessment and  Recommendations: ? ?46) 76 year old male on Eliquis for atrial fibrillation with IDA. S/P EGD and colonoscopy 02/08/2022. EGD showed a small hiatal hernia and nodular mucosa in the gastric fundus and gastric body otherwise w

## 2022-03-04 ENCOUNTER — Encounter: Payer: Self-pay | Admitting: Nurse Practitioner

## 2022-03-04 NOTE — Progress Notes (Signed)
Reviewed and agree with management plans. ? ?Jenia Klepper L. Lean Jaeger, MD, MPH  ?

## 2022-03-05 DIAGNOSIS — I10 Essential (primary) hypertension: Secondary | ICD-10-CM | POA: Diagnosis not present

## 2022-03-05 DIAGNOSIS — I25119 Atherosclerotic heart disease of native coronary artery with unspecified angina pectoris: Secondary | ICD-10-CM | POA: Diagnosis not present

## 2022-03-05 DIAGNOSIS — R7309 Other abnormal glucose: Secondary | ICD-10-CM | POA: Diagnosis not present

## 2022-03-07 ENCOUNTER — Encounter: Payer: Self-pay | Admitting: Cardiovascular Disease

## 2022-03-08 NOTE — Telephone Encounter (Signed)
I will defer to Dr. Gwenlyn Found.  Patient on carvedilol 3.125 mg and do not want to d/c without MD approval.   ?

## 2022-03-11 ENCOUNTER — Ambulatory Visit: Payer: Medicare Other | Admitting: Neurology

## 2022-03-11 DIAGNOSIS — R202 Paresthesia of skin: Secondary | ICD-10-CM

## 2022-03-11 DIAGNOSIS — R053 Chronic cough: Secondary | ICD-10-CM | POA: Diagnosis not present

## 2022-03-11 DIAGNOSIS — I25119 Atherosclerotic heart disease of native coronary artery with unspecified angina pectoris: Secondary | ICD-10-CM | POA: Diagnosis not present

## 2022-03-11 DIAGNOSIS — Z951 Presence of aortocoronary bypass graft: Secondary | ICD-10-CM | POA: Diagnosis not present

## 2022-03-11 DIAGNOSIS — Z0000 Encounter for general adult medical examination without abnormal findings: Secondary | ICD-10-CM | POA: Diagnosis not present

## 2022-03-11 DIAGNOSIS — G5622 Lesion of ulnar nerve, left upper limb: Secondary | ICD-10-CM

## 2022-03-11 DIAGNOSIS — R059 Cough, unspecified: Secondary | ICD-10-CM | POA: Diagnosis not present

## 2022-03-11 DIAGNOSIS — Z0001 Encounter for general adult medical examination with abnormal findings: Secondary | ICD-10-CM | POA: Diagnosis not present

## 2022-03-11 NOTE — Procedures (Signed)
[facility]  ?[address], [address] ? [place], [place] [address] ?Tel: [phone] ?Fax:  [phone] ?Test Date:  03/11/2022 ? ?Patient: Gregory Shepherd DOB: [birth year] Physician: Narda Amber, DO  ?Sex: Male Height: '6\' 1"'$  Ref Phys: Hulan Saas, DO  ?ID#: [id number]   Technician:   ? ?Patient Complaints: ?This is a 76 year old man referred for evaluation of left hand weakness. ? ?NCV & EMG Findings: ?Extensive electrodiagnostic testing of the left upper extremity shows:  ?Left median and mixed palmar sensory responses are within normal limits.  Left ulnar sensory response shows prolonged latency (3.8 ms).   ?Left median motor responses within normal limits.  Left ulnar motor response shows slowed conduction velocity across the elbow (A Elbow-B Elbow, 37 m/s).   ?Chronic motor axonal loss changes are seen affecting the left ulnar innervated muscles, without accompanied active denervation.  ? ?Impression: ?Left ulnar neuropathy with slowing across the elbow, purely demyelinating, mild-to-moderate. ?There is no evidence of carpal tunnel syndrome or cervical radiculopathy affecting the left upper extremity. ? ? ?___________________________ ?Narda Amber, DO ? ? ? ?Nerve Conduction Studies ?Anti Sensory Summary Table ? ? Stim Site NR Peak (ms) Norm Peak (ms) P-T Amp (?V) Norm P-T Amp  ?Left Median Anti Sensory (2nd Digit)  32?C  ?Wrist    3.4 <3.8 19.1 >10  ?Left Ulnar Anti Sensory (5th Digit)  32?C  ?Wrist    3.8 <3.2 10.2 >5  ? ?Motor Summary Table ? ? Stim Site NR Onset (ms) Norm Onset (ms) O-P Amp (mV) Norm O-P Amp Site1 Site2 Delta-0 (ms) Dist (cm) Vel (m/s) Norm Vel (m/s)  ?Left Median Motor (Abd Poll Brev)  32?C  ?Wrist    3.7 <4.0 5.3 >5 Elbow Wrist 6.0 31.0 52 >50  ?Elbow    9.7  4.7         ?Left Ulnar Motor (Abd Dig Minimi)  32?C  ?Wrist    2.8 <3.1 9.4 >7 B Elbow Wrist 4.3 24.0 56 >50  ?B Elbow    7.1  8.8  A Elbow B Elbow 2.7 10.0 37 >50  ?A Elbow    9.8  8.8         ? ?Comparison Summary  Table ? ? Stim Site NR Peak (ms) Norm Peak (ms) P-T Amp (?V) Site1 Site2 Delta-P (ms) Norm Delta (ms)  ?Left Median/Ulnar Palm Comparison (Wrist - 8cm)  32?C  ?Median Palm    2.1 <2.2 52.8 Median Palm Ulnar Palm 0.3   ?Ulnar Palm    1.8 <2.2 16.2      ? ?EMG ? ? Side Muscle Ins Act Fibs Psw Fasc Number Recrt Dur Dur. Amp Amp. Poly Poly. Comment  ?Left 1stDorInt Nml Nml Nml Nml 1- Rapid Some 1+ Some 1+ Some 1+ N/A  ?Left ABD Dig Min Nml Nml Nml Nml 1- Rapid Some 1+ Some 1+ Some 1+ N/A  ?Left Biceps Nml Nml Nml Nml Nml Nml Nml Nml Nml Nml Nml Nml N/A  ?Left Triceps Nml Nml Nml Nml Nml Nml Nml Nml Nml Nml Nml Nml N/A  ?Left Deltoid Nml Nml Nml Nml Nml Nml Nml Nml Nml Nml Nml Nml N/A  ? ? ? ? ?Waveforms: ?    ? ?   ? ? ?

## 2022-03-15 ENCOUNTER — Other Ambulatory Visit: Payer: Self-pay | Admitting: Internal Medicine

## 2022-03-17 ENCOUNTER — Other Ambulatory Visit: Payer: Self-pay | Admitting: Internal Medicine

## 2022-03-17 DIAGNOSIS — F17210 Nicotine dependence, cigarettes, uncomplicated: Secondary | ICD-10-CM

## 2022-03-23 ENCOUNTER — Ambulatory Visit: Payer: Medicare Other | Admitting: Cardiovascular Disease

## 2022-03-23 ENCOUNTER — Other Ambulatory Visit (INDEPENDENT_AMBULATORY_CARE_PROVIDER_SITE_OTHER): Payer: Medicare Other

## 2022-03-23 ENCOUNTER — Ambulatory Visit (INDEPENDENT_AMBULATORY_CARE_PROVIDER_SITE_OTHER): Payer: Medicare Other

## 2022-03-23 ENCOUNTER — Encounter: Payer: Self-pay | Admitting: Cardiovascular Disease

## 2022-03-23 VITALS — BP 124/52 | HR 52 | Ht 73.05 in | Wt 178.0 lb

## 2022-03-23 DIAGNOSIS — E782 Mixed hyperlipidemia: Secondary | ICD-10-CM

## 2022-03-23 DIAGNOSIS — D509 Iron deficiency anemia, unspecified: Secondary | ICD-10-CM

## 2022-03-23 DIAGNOSIS — K921 Melena: Secondary | ICD-10-CM | POA: Diagnosis not present

## 2022-03-23 DIAGNOSIS — R1013 Epigastric pain: Secondary | ICD-10-CM

## 2022-03-23 DIAGNOSIS — I4819 Other persistent atrial fibrillation: Secondary | ICD-10-CM

## 2022-03-23 DIAGNOSIS — D649 Anemia, unspecified: Secondary | ICD-10-CM | POA: Diagnosis not present

## 2022-03-23 DIAGNOSIS — R5383 Other fatigue: Secondary | ICD-10-CM | POA: Diagnosis not present

## 2022-03-23 DIAGNOSIS — Z951 Presence of aortocoronary bypass graft: Secondary | ICD-10-CM | POA: Diagnosis not present

## 2022-03-23 DIAGNOSIS — R42 Dizziness and giddiness: Secondary | ICD-10-CM

## 2022-03-23 DIAGNOSIS — I10 Essential (primary) hypertension: Secondary | ICD-10-CM

## 2022-03-23 DIAGNOSIS — I739 Peripheral vascular disease, unspecified: Secondary | ICD-10-CM

## 2022-03-23 LAB — CBC
HCT: 40.2 % (ref 39.0–52.0)
Hemoglobin: 13.4 g/dL (ref 13.0–17.0)
MCHC: 33.3 g/dL (ref 30.0–36.0)
MCV: 86.4 fl (ref 78.0–100.0)
Platelets: 164 10*3/uL (ref 150.0–400.0)
RBC: 4.66 Mil/uL (ref 4.22–5.81)
RDW: 21.2 % — ABNORMAL HIGH (ref 11.5–15.5)
WBC: 6.2 10*3/uL (ref 4.0–10.5)

## 2022-03-23 LAB — IRON: Iron: 298 ug/dL — ABNORMAL HIGH (ref 42–165)

## 2022-03-23 LAB — IBC + FERRITIN
Ferritin: 32.3 ng/mL (ref 22.0–322.0)
Iron: 298 ug/dL — ABNORMAL HIGH (ref 42–165)
Saturation Ratios: 95.5 % — ABNORMAL HIGH (ref 20.0–50.0)
TIBC: 312.2 ug/dL (ref 250.0–450.0)
Transferrin: 223 mg/dL (ref 212.0–360.0)

## 2022-03-23 NOTE — Progress Notes (Signed)
? ? ? ?03/23/2022 ?[place]   ?September 28, 1946  ?[id number] ? ?Primary Physician Kirk Ruths, MD ?Primary Cardiologist: Lorretta Harp MD Lupe Carney, [place] ? ?HPI:  Gregory Shepherd is a 76 y.o.  mild to moderately overweight married Caucasian male father of 2, grandfather of 3 grandchildren who I last saw in the office 04/15/2021.  He is accompanied by his wife [name] today.  He was referred by Dr. Gardenia Phlegm for peripheral vascular valuation because of claudication.  He is retired from being in [employer]  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  He has treated hypertension hyperlipidemia.  His father did have a myocardial infarction.  He had CABG x2 at [facility] in [place] by Dr. Eliberto Ivory.  He did have a remote MI at age 40 as well.  He had redo bypass surgery x3 at [facility] 8 years later.    His major issue is with left lower extremity numbness and claudication.  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  He has high-frequency signals in both iliac arteries and monophasic waveforms below that on the right. ?  ?We proceeded with lower extremity angiography and intervention in 06/04/2019 revealing a 99% calcified ostial/proximal right common iliac artery stenosis and a 60 to 70% left common iliac artery stenosis.  Performed diamondback orbital rotational atherectomy, PTA and stenting with an excellent result.  His Dopplers normalized and his claudication resolved. ?  ?Since I saw him in the office a year ago he continues to do well.  He no longer has lower extremity edema on diuretics and compression stockings.  His follow-up aortoiliac Dopplers revealed continued patency of his iliac stent although he does have moderately elevated velocities bilaterally.  He denies claudication.  He did undergo DC cardioversion at  the end of December 2022 after being put on amiodarone.  He has maintained sinus rhythm/sinus bradycardia on amiodarone, low-dose carvedilol and Eliquis.  Does complain of some morning dizziness which may be related to rhythm/rate.  He denies chest pain or shortness of breath. ? ? ?Current Meds  ?Medication Sig  ? amiodarone (PACERONE) 200 MG tablet Take 200 mg by mouth daily.  ? apixaban (ELIQUIS) 5 MG TABS tablet TAKE 1 TABLET BY MOUTH TWICE A DAY  ? aspirin 81 MG chewable tablet Chew 81 mg by mouth daily.  ? atorvastatin (LIPITOR) 80 MG tablet Take 80 mg by mouth daily.  ? BREO ELLIPTA 100-25 MCG/INH AEPB Inhale 1 puff into the lungs daily as needed (Shortness of breath). As needed.  ? carvedilol (COREG) 3.125 MG tablet Take 1 tablet (3.125 mg total) by mouth 2 (two) times daily.  ? Cholecalciferol (VITAMIN D) 50 MCG (2000 UT) CAPS Take 4,000 Units by mouth daily.  ? ferrous sulfate 325 (65 FE) MG tablet Take 325 mg by mouth every other day.  ? finasteride (PROSCAR) 5 MG tablet Take 1 tablet (5 mg total) by mouth daily.  ? gabapentin (NEURONTIN) 300 MG capsule Take 300 mg by mouth in the morning, at noon, in the evening, and at bedtime.  ? isosorbide mononitrate (IMDUR) 60 MG 24 hr tablet Take 60 mg by mouth daily.  ? lansoprazole (PREVACID) 30 MG capsule Take 1 capsule (30 mg total) by mouth 2 (two) times daily before a meal.  ? Melatonin 10  MG TABS Take 10 mg by mouth at bedtime.  ? Multiple Vitamin (MULTIVITAMIN) tablet Take 1 tablet by mouth daily.  ? nitroGLYCERIN (NITROSTAT) 0.4 MG SL tablet Place 1 tablet (0.4 mg total) under the tongue as needed for chest pain.  ? potassium chloride (KLOR-CON) 10 MEQ tablet Take 2 tablets (20 mEq total) by mouth 2 (two) times daily.  ? torsemide (DEMADEX) 20 MG tablet Take 2 tablets (40 mg total) by mouth 2 (two) times daily.  ? traZODone (DESYREL) 50 MG tablet Take 0.5 tablets (25 mg total) by mouth 2 (two) times daily. Take 25 tablets by mouth 2 (two) times daily.  (Patient taking differently: Take 25 mg by mouth at bedtime.)  ? vitamin B-12 (CYANOCOBALAMIN) 1000 MCG tablet Take 1,000 mcg by mouth daily.  ? vitamin C (ASCORBIC ACID) 500 MG tablet Take 500 mg by mouth daily.  ?  ? ?Allergies  ?Allergen Reactions  ? Omeprazole   ?  doesn't work well with plavix   ? Other Palpitations  ?  Certain steroids give patient heart palpitations  ?Allergy to green peppers  ? ? ?Social History  ? ?Socioeconomic History  ? Marital status: Married  ?  Spouse name: Not on file  ? Number of children: Not on file  ? Years of education: Not on file  ? Highest education level: Not on file  ?Occupational History  ? Not on file  ?Tobacco Use  ? Smoking status: Every Day  ?  Types: Cigarettes  ? Smokeless tobacco: Never  ? Tobacco comments:  ?  8-9 cigarettes per day  ?Vaping Use  ? Vaping Use: Never used  ?Substance and Sexual Activity  ? Alcohol use: No  ? Drug use: No  ? Sexual activity: Not on file  ?Other Topics Concern  ? Not on file  ?Social History Narrative  ? Not on file  ? ?Social Determinants of Health  ? ?Financial Resource Strain: Not on file  ?Food Insecurity: Not on file  ?Transportation Needs: Not on file  ?Physical Activity: Not on file  ?Stress: Not on file  ?Social Connections: Not on file  ?Intimate Partner Violence: Not on file  ?  ? ?Review of Systems: ?General: negative for chills, fever, night sweats or weight changes.  ?Cardiovascular: negative for chest pain, dyspnea on exertion, edema, orthopnea, palpitations, paroxysmal nocturnal dyspnea or shortness of breath ?Dermatological: negative for rash ?Respiratory: negative for cough or wheezing ?Urologic: negative for hematuria ?Abdominal: negative for nausea, vomiting, diarrhea, bright red blood per rectum, melena, or hematemesis ?Neurologic: negative for visual changes, syncope, or dizziness ?All other systems reviewed and are otherwise negative except as noted above. ? ? ? ?Blood pressure (!) 124/52, pulse (!) 52, height 6'  1.05" (1.855 m), weight 178 lb (80.7 kg), SpO2 96 %.  ?General appearance: alert and no distress ?Neck: no adenopathy, no carotid bruit, no JVD, supple, symmetrical, trachea midline, and thyroid not enlarged, symmetric, no tenderness/mass/nodules ?Lungs: clear to auscultation bilaterally ?Heart: regular rate and rhythm, S1, S2 normal, no murmur, click, rub or gallop ?Extremities: 1+ lower extremity edema ?Pulses: 2+ and symmetric ?Skin: Skin color, texture, turgor normal. No rashes or lesions ?Neurologic: Grossly normal ? ?EKG sinus bradycardia at 52 with right bundle branch block and left axis deviation.  I personally reviewed this EKG. ? ?ASSESSMENT AND PLAN:  ? ?Hyperlipemia ?History of hyperlipidemia on statin therapy with lipid profile performed 03/05/2022 revealing a total cholesterol 142, LDL of 66 and HDL of 35. ? ?Hx  of CABG ?History of CAD status post CABG x3 at [facility] in 1998 with redo x3 at [facility] in 1998.  His last Myoview performed 09/10/2021 was low risk and nonischemic.  He denies chest pain or shortness of breath. ? ?Essential (primary) hypertension ?History of essential hypertension a blood pressure measured at 124/52.  He is on low-dose carvedilol. ? ?Peripheral arterial disease ([place]) ?History of peripheral arterial disease status post right common iliac artery artery orbital atherectomy, PTA and stenting by myself 06/04/2019.  He did have 60 to 70% calcified ostial left common iliac artery stenosis but denies left lower extremity claudication.  His most recent Doppler studies show stable iliac velocities compared to last year. ? ?Persistent atrial fibrillation ([place]) ?Patient maintaining sinus rhythm status post DC cardioversion in December 2022 on amiodarone, low-dose carvedilol and Eliquis.  He does complain of some dizziness in the mornings which resolves later in the day.  He is somewhat bradycardic with a heart rate in the low 50s.  Is possible that this may be rate related.  I am going to get a  2-week Zio patch to further evaluate.  He is also question whether or not he is a candidate for a Watchman device which I think would be an excellent question to address with Dr. Quentin Ore. ? ? ? ? ?Pearletha Forge.

## 2022-03-23 NOTE — Assessment & Plan Note (Signed)
History of hyperlipidemia on statin therapy with lipid profile performed 03/05/2022 revealing a total cholesterol 142, LDL of 66 and HDL of 35. ?

## 2022-03-23 NOTE — Progress Notes (Unsigned)
Enrolled patient for a 14 day Zio XT  monitor to be mailed to patients home  °

## 2022-03-23 NOTE — Assessment & Plan Note (Signed)
History of essential hypertension a blood pressure measured at 124/52.  He is on low-dose carvedilol. ?

## 2022-03-23 NOTE — Assessment & Plan Note (Signed)
History of peripheral arterial disease status post right common iliac artery artery orbital atherectomy, PTA and stenting by myself 06/04/2019.  He did have 60 to 70% calcified ostial left common iliac artery stenosis but denies left lower extremity claudication.  His most recent Doppler studies show stable iliac velocities compared to last year. ?

## 2022-03-23 NOTE — Assessment & Plan Note (Signed)
Patient maintaining sinus rhythm status post DC cardioversion in December 2022 on amiodarone, low-dose carvedilol and Eliquis.  He does complain of some dizziness in the mornings which resolves later in the day.  He is somewhat bradycardic with a heart rate in the low 50s.  Is possible that this may be rate related.  I am going to get a 2-week Zio patch to further evaluate.  He is also question whether or not he is a candidate for a Watchman device which I think would be an excellent question to address with Dr. Quentin Ore. ?

## 2022-03-23 NOTE — Patient Instructions (Signed)
Medication Instructions:  ?Your physician recommends that you continue on your current medications as directed. Please refer to the Current Medication list given to you today. ? ?*If you need a refill on your cardiac medications before your next appointment, please call your pharmacy* ? ? ?Testing/Procedures: ?Your physician has requested that you have an echocardiogram. Echocardiography is a painless test that uses sound waves to create images of your heart. It provides your doctor with information about the size and shape of your heart and how well your heart?s chambers and valves are working. This procedure takes approximately one hour. There are no restrictions for this procedure. This procedure will be done at [address] ? ?ZIO XT- Long Term Monitor Instructions ? ?Your physician has requested you wear a ZIO patch monitor for 14 days.  ?This is a single patch monitor. [employer] supplies one patch monitor per enrollment. Additional ?stickers are not available. Please do not apply patch if you will be having a Nuclear Stress Test,  ?Echocardiogram, Cardiac CT, MRI, or Chest Xray during the period you would be wearing the  ?monitor. The patch cannot be worn during these tests. You cannot remove and re-apply the  ?ZIO XT patch monitor.  ?Your ZIO patch monitor will be mailed 3 day USPS to your address on file. It may take 3-5 days  ?to receive your monitor after you have been enrolled.  ?Once you have received your monitor, please review the enclosed instructions. Your monitor  ?has already been registered assigning a specific monitor serial # to you. ? ?Billing and Patient Assistance Program Information ? ?We have supplied [employer] with any of your insurance information on file for billing purposes. ?[employer] offers a sliding scale Patient Assistance Program for patients that do not have  ?insurance, or whose insurance does not completely cover the cost of the ZIO monitor.  ?You must apply for the  Patient Assistance Program to qualify for this discounted rate.  ?To apply, please call [employer] at [phone], select option 4, select option 2, ask to apply for  ?Patient Assistance Program. Theodore Demark will ask your household income, and how many people  ?are in your household. They will quote your out-of-pocket cost based on that information.  ?[employer] will also be able to set up a 92-month interest-free payment plan if needed. ? ?Applying the monitor ?  ?Shave hair from upper left chest.  ?Hold abrader disc by orange tab. Rub abrader in 40 strokes over the upper left chest as  ?indicated in your monitor instructions.  ?Clean area with 4 enclosed alcohol pads. Let dry.  ?Apply patch as indicated in monitor instructions. Patch will be placed under collarbone on left  ?side of chest with arrow pointing upward.  ?Rub patch adhesive wings for 2 minutes. Remove white label marked "1". Remove the white  ?label marked "2". Rub patch adhesive wings for 2 additional minutes.  ?While looking in a mirror, press and release button in center of patch. A small green light will  ?flash 3-4 times. This will be your only indicator that the monitor has been turned on.  ?Do not shower for the first 24 hours. You may shower after the first 24 hours.  ?Press the button if you feel a symptom. You will hear a small click. Record Date, Time and  ?Symptom in the Patient Logbook.  ?When you are ready to remove the patch, follow instructions on the last 2 pages of Patient  ?Logbook. Stick patch monitor onto  the last page of Patient Logbook.  ?Place Patient Logbook in the blue and white box. Use locking tab on box and tape box closed  ?securely. The blue and white box has prepaid postage on it. Please place it in the mailbox as  ?soon as possible. Your physician should have your test results approximately 7 days after the  ?monitor has been mailed back to [facility].  ?Call [facility] at [phone] if you have  questions regarding  ?your ZIO XT patch monitor. Call them immediately if you see an orange light blinking on your  ?monitor.  ?If your monitor falls off in less than 4 days, contact our Monitor department at [phone].  ?If your monitor becomes loose or falls off after 4 days call [employer] at [phone] for  ?suggestions on securing your monitor ? ? ? ?Follow-Up: ?At [facility], you and your health needs are our priority.  As part of our continuing mission to provide you with exceptional heart care, we have created designated Provider Care Teams.  These Care Teams include your primary Cardiologist (physician) and Advanced Practice Providers (APPs -  Physician Assistants and Nurse Practitioners) who all work together to provide you with the care you need, when you need it. ? ?We recommend signing up for the patient portal called "MyChart".  Sign up information is provided on this After Visit Summary.  MyChart is used to connect with patients for Virtual Visits (Telemedicine).  Patients are able to view lab/test results, encounter notes, upcoming appointments, etc.  Non-urgent messages can be sent to your provider as well.   ?To learn more about what you can do with MyChart, go to [email or web address].   ? ?Your next appointment:   ?6 month(s) ? ?The format for your next appointment:   ?In Person ? ?Provider:   ?Quay Burow, MD  ?

## 2022-03-23 NOTE — Assessment & Plan Note (Signed)
History of CAD status post CABG x3 at [facility] in 1998 with redo x3 at [facility] in 1998.  His last Myoview performed 09/10/2021 was low risk and nonischemic.  He denies chest pain or shortness of breath. ?

## 2022-03-24 ENCOUNTER — Other Ambulatory Visit: Payer: Self-pay

## 2022-03-24 DIAGNOSIS — D2262 Melanocytic nevi of left upper limb, including shoulder: Secondary | ICD-10-CM | POA: Diagnosis not present

## 2022-03-24 DIAGNOSIS — D2261 Melanocytic nevi of right upper limb, including shoulder: Secondary | ICD-10-CM | POA: Diagnosis not present

## 2022-03-24 DIAGNOSIS — D225 Melanocytic nevi of trunk: Secondary | ICD-10-CM | POA: Diagnosis not present

## 2022-03-24 DIAGNOSIS — D2271 Melanocytic nevi of right lower limb, including hip: Secondary | ICD-10-CM | POA: Diagnosis not present

## 2022-03-24 DIAGNOSIS — D509 Iron deficiency anemia, unspecified: Secondary | ICD-10-CM

## 2022-03-26 ENCOUNTER — Ambulatory Visit: Payer: Medicare Other | Admitting: Family Medicine

## 2022-03-26 VITALS — BP 112/56 | HR 49 | Ht 73.0 in | Wt 178.0 lb

## 2022-03-26 DIAGNOSIS — M48062 Spinal stenosis, lumbar region with neurogenic claudication: Secondary | ICD-10-CM | POA: Diagnosis not present

## 2022-03-26 DIAGNOSIS — M545 Low back pain, unspecified: Secondary | ICD-10-CM | POA: Diagnosis not present

## 2022-03-26 NOTE — Progress Notes (Signed)
?Gregory Shepherd D.O. ?[facility] ?[place] ?Phone: [phone] ?Subjective:   ?I, Gregory Shepherd, am serving as a scribe for Dr. Hulan Saas. ? ?This visit occurred during the SARS-CoV-2 public health emergency.  Safety protocols were in place, including screening questions prior to the visit, additional usage of staff PPE, and extensive cleaning of exam room while observing appropriate contact time as indicated for disinfecting solutions.  ? ? ?I'm seeing this patient by the request  of:  Kirk Ruths, MD ? ?CC:  ? ?[id number]  ?02/12/2022 ?Chronic problem but I do think patient is having exacerbation.  Still seems to be more on the right now than the left.  Consistent with the L4-L5 impingement that was noted on patient's MRI less than 3 years ago.  We will get further x-rays to rule out any bony abnormalities but I do think that this is likely going to help with another epidural.  Patient had that left side previously and would like one on the right side at this time.  Patient will likely have to hold his blood thinner which he has done for his colonoscopy.  Patient will follow-up with me again 6 weeks ? ?Patient does have some left hand weakness noted.  Negative Tinel's sign noted.  Has had significant claudication of the peripheral vascular disease previously but with patient being on blood thinners highly unlikely at this moment.  Patient is already on gabapentin does not have any significant improvement at this time.  I would like to get a nerve conduction study to further evaluate if this is more carpal tunnel, cervical radiculopathy or something more central.  Patient was diagnosed with more of a atrial fibrillation and could consider the possibility of the CT head but with patient already being on a blood thinner, statin and being treated for his other comorbidities I do not think it would change medical management significantly.  Patient will follow-up  with me again in 6 weeks otherwise. ? ?Update 03/26/2022 ?Gregory Shepherd is a 76 y.o. male coming in with complaint of lumbar and cervical spine pain. Epidural on 03/02/2022. Patient states that he did not have any relief from epidural in lumbar spine. Continues to have constant numbness in R leg and intermittent back pain especially with standing in one place.  ? ?Xray lumbar 02/12/2022 ?IMPRESSION: ?1. No acute displaced fracture or traumatic listhesis of the lumbar ?spine in a patient with the low curvature lumbar spine associated ?degenerative changes. 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 ?2. Aortic Atherosclerosis (ICD10-I70.0) -severe with a common iliac ?stents on the right. ? ?Xray cervical 02/12/2022 ? IMPRESSION: ?No acute displaced fracture or traumatic listhesis of the cervical ?spine. Limited evaluation due to overlapping osseous structures and ?overlying soft tissues. ? ?Past Medical History:  ?Diagnosis Date  ? Carotid artery narrowing   ? History of blood clots   ? History of colon polyps   ? Hyperlipidemia   ? Hypertension   ? MI (myocardial infarction) ([facility])   ? ?Past Surgical History:  ?Procedure Laterality Date  ? ABDOMINAL AORTOGRAM W/LOWER EXTREMITY N/A 06/04/2019  ? Procedure: ABDOMINAL AORTOGRAM W/LOWER EXTREMITY;  Surgeon: Lorretta Harp, MD;  Location: [place] [facility];  Service: Cardiovascular;  Laterality: N/A;  ? BIOPSY  02/08/2022  ? Procedure: BIOPSY;  Surgeon: Thornton Park, MD;  Location: [facility];  Service: Gastroenterology;;  ? CARDIAC SURGERY    ? x2   ? CARDIOVERSION  N/A 08/25/2021  ? Procedure: CARDIOVERSION;  Surgeon: Skeet Latch, MD;  Location: [place];  Service: Cardiovascular;  Laterality: N/A;  ? CARDIOVERSION N/A 11/23/2021  ? Procedure: CARDIOVERSION;  Surgeon: [place] Headings, MD;  Location: [facility];  Service: Cardiovascular;  Laterality: N/A;  ? COLONOSCOPY    ? Age 58. [facility]  ? COLONOSCOPY WITH PROPOFOL N/A 02/08/2022  ? Procedure: COLONOSCOPY WITH PROPOFOL;  Surgeon: Thornton Park, MD;  Location: [facility];  Service: Gastroenterology;  Laterality: N/A;  ? CORONARY ARTERY BYPASS GRAFT  1990  ? x 2 at [facility], redo x 3 @ [facility] approx 1998  ? ESOPHAGOGASTRODUODENOSCOPY (EGD) WITH PROPOFOL N/A 02/08/2022  ? Procedure: ESOPHAGOGASTRODUODENOSCOPY (EGD) WITH PROPOFOL;  Surgeon: Thornton Park, MD;  Location: [facility];  Service: Gastroenterology;  Laterality: N/A;  ? PERIPHERAL VASCULAR ATHERECTOMY Right 06/04/2019  ? Procedure: PERIPHERAL VASCULAR ATHERECTOMY;  Surgeon: Lorretta Harp, MD;  Location: [place] [facility];  Service: Cardiovascular;  Laterality: Right;  common iliac  ? PERIPHERAL VASCULAR INTERVENTION Right 06/04/2019  ? Procedure: PERIPHERAL VASCULAR INTERVENTION;  Surgeon: Lorretta Harp, MD;  Location: [place] [facility];  Service: Cardiovascular;  Laterality: Right;  common iliac  ? POLYPECTOMY  02/08/2022  ? Procedure: POLYPECTOMY;  Surgeon: Thornton Park, MD;  Location: [facility];  Service: Gastroenterology;;  ? TEE WITHOUT CARDIOVERSION N/A 08/25/2021  ? Procedure: TRANSESOPHAGEAL ECHOCARDIOGRAM (TEE);  Surgeon: Skeet Latch, MD;  Location: [facility];  Service: Cardiovascular;  Laterality: N/A;  ? TEE WITHOUT CARDIOVERSION N/A 11/23/2021  ? Procedure: TRANSESOPHAGEAL ECHOCARDIOGRAM (TEE);  Surgeon: Acie Fredrickson Wonda Cheng, MD;  Location: [place];  Service: Cardiovascular;  Laterality: N/A;  ? ?Social History  ? ?Socioeconomic History  ? Marital status: Married  ?  Spouse name: Not on file  ? Number of children: Not on file  ? Years of education: Not on file  ? Highest education level: Not on file  ?Occupational History  ? Not on file  ?Tobacco Use  ? Smoking status: Every Day  ?  Types: Cigarettes  ? Smokeless tobacco: Never  ? Tobacco comments:  ?  8-9 cigarettes per day  ?Vaping Use  ? Vaping Use: Never used  ?Substance and  Sexual Activity  ? Alcohol use: No  ? Drug use: No  ? Sexual activity: Not on file  ?Other Topics Concern  ? Not on file  ?Social History Narrative  ? Not on file  ? ?Social Determinants of Health  ? ?Financial Resource Strain: Not on file  ?Food Insecurity: Not on file  ?Transportation Needs: Not on file  ?Physical Activity: Not on file  ?Stress: Not on file  ?Social Connections: Not on file  ? ?Allergies  ?Allergen Reactions  ? Omeprazole   ?  doesn't work well with plavix   ? Other Palpitations  ?  Certain steroids give patient heart palpitations  ?Allergy to green peppers  ? ?Family History  ?Problem Relation Age of Onset  ? Heart disease Mother   ? Heart disease Father   ? Diabetes Sister   ? Prostate cancer Neg Hx   ? Bladder Cancer Neg Hx   ? Kidney cancer Neg Hx   ? Colon cancer Neg Hx   ? Esophageal cancer Neg Hx   ? ? ? ?Current Outpatient Medications (Cardiovascular):  ?  amiodarone (PACERONE) 200 MG tablet, Take 200 mg by mouth daily. ?  atorvastatin (LIPITOR) 80 MG tablet, Take 80 mg by mouth daily. ?  carvedilol (COREG) 3.125  MG tablet, Take 1 tablet (3.125 mg total) by mouth 2 (two) times daily. ?  isosorbide mononitrate (IMDUR) 60 MG 24 hr tablet, Take 60 mg by mouth daily. ?  nitroGLYCERIN (NITROSTAT) 0.4 MG SL tablet, Place 1 tablet (0.4 mg total) under the tongue as needed for chest pain. ?  torsemide (DEMADEX) 20 MG tablet, Take 2 tablets (40 mg total) by mouth 2 (two) times daily. ? ?Current Outpatient Medications (Respiratory):  ?  BREO ELLIPTA 100-25 MCG/INH AEPB, Inhale 1 puff into the lungs daily as needed (Shortness of breath). As needed. ? ?Current Outpatient Medications (Analgesics):  ?  aspirin 81 MG chewable tablet, Chew 81 mg by mouth daily. ? ?Current Outpatient Medications (Hematological):  ?  apixaban (ELIQUIS) 5 MG TABS tablet, TAKE 1 TABLET BY MOUTH TWICE A DAY ?  ferrous sulfate 325 (65 FE) MG tablet, Take 325 mg by mouth every other day. ?  vitamin B-12 (CYANOCOBALAMIN) 1000 MCG  tablet, Take 1,000 mcg by mouth daily. ? ?Current Outpatient Medications (Other):  ?  Cholecalciferol (VITAMIN D) 50 MCG (2000 UT) CAPS, Take 4,000 Units by mouth daily. ?  finasteride (PROSCAR) 5 MG tablet

## 2022-03-26 NOTE — Patient Instructions (Signed)
[place] [phone] ?Call Today ? ?When we receive your results we will contact you. ? ?Elbow compression sleeve and something soft to put your elbow on ? ?

## 2022-03-27 NOTE — Assessment & Plan Note (Signed)
Worsening pain and symptoms with no benefit from the epidural. Last MRI was 76 years old and will repeat to see if any other progression could be contributing to symptoms and if this would change management,  F/u after imaging  ?

## 2022-03-31 ENCOUNTER — Ambulatory Visit
Admission: RE | Admit: 2022-03-31 | Discharge: 2022-03-31 | Disposition: A | Discharge status: Home / Self Care | Payer: Medicare Other | Source: Ambulatory Visit | Attending: Internal Medicine | Admitting: Internal Medicine

## 2022-03-31 DIAGNOSIS — I700 Atherosclerosis of aorta: Secondary | ICD-10-CM | POA: Diagnosis not present

## 2022-03-31 DIAGNOSIS — I7789 Other specified disorders of arteries and arterioles: Secondary | ICD-10-CM | POA: Diagnosis not present

## 2022-03-31 DIAGNOSIS — F17210 Nicotine dependence, cigarettes, uncomplicated: Secondary | ICD-10-CM

## 2022-03-31 DIAGNOSIS — J432 Centrilobular emphysema: Secondary | ICD-10-CM | POA: Diagnosis not present

## 2022-04-01 ENCOUNTER — Ambulatory Visit
Admission: RE | Admit: 2022-04-01 | Discharge: 2022-04-01 | Disposition: A | Discharge status: Home / Self Care | Payer: Medicare Other | Source: Ambulatory Visit | Attending: Family Medicine | Admitting: Family Medicine

## 2022-04-01 ENCOUNTER — Other Ambulatory Visit: Payer: Self-pay

## 2022-04-01 ENCOUNTER — Encounter: Payer: Self-pay | Admitting: Family Medicine

## 2022-04-01 DIAGNOSIS — M48061 Spinal stenosis, lumbar region without neurogenic claudication: Secondary | ICD-10-CM | POA: Diagnosis not present

## 2022-04-01 DIAGNOSIS — M545 Low back pain, unspecified: Secondary | ICD-10-CM | POA: Diagnosis not present

## 2022-04-01 DIAGNOSIS — M4316 Spondylolisthesis, lumbar region: Secondary | ICD-10-CM | POA: Diagnosis not present

## 2022-04-01 DIAGNOSIS — M79604 Pain in right leg: Secondary | ICD-10-CM | POA: Diagnosis not present

## 2022-04-01 DIAGNOSIS — M5416 Radiculopathy, lumbar region: Secondary | ICD-10-CM

## 2022-04-01 MED ORDER — LANSOPRAZOLE 30 MG PO CPDR: 30.0000 mg | DELAYED_RELEASE_CAPSULE | Freq: Every day | ORAL | 1 refills | Status: DC

## 2022-04-05 MED ORDER — AMIODARONE HCL 200 MG PO TABS
100.0000 mg | ORAL_TABLET | Freq: Every day | ORAL | 3 refills | Status: DC
Start: 2022-04-05 — End: 2023-04-12

## 2022-04-07 ENCOUNTER — Other Ambulatory Visit (INDEPENDENT_AMBULATORY_CARE_PROVIDER_SITE_OTHER): Payer: Medicare Other

## 2022-04-07 DIAGNOSIS — D509 Iron deficiency anemia, unspecified: Secondary | ICD-10-CM | POA: Diagnosis not present

## 2022-04-07 LAB — IBC + FERRITIN
Ferritin: 43.2 ng/mL (ref 22.0–322.0)
Iron: 68 ug/dL (ref 42–165)
Saturation Ratios: 21.9 % (ref 20.0–50.0)
TIBC: 310.8 ug/dL (ref 250.0–450.0)
Transferrin: 222 mg/dL (ref 212.0–360.0)

## 2022-04-12 ENCOUNTER — Other Ambulatory Visit: Payer: Self-pay

## 2022-04-12 DIAGNOSIS — D509 Iron deficiency anemia, unspecified: Secondary | ICD-10-CM

## 2022-04-13 ENCOUNTER — Ambulatory Visit (HOSPITAL_COMMUNITY): Payer: Medicare Other | Attending: Cardiology

## 2022-04-13 DIAGNOSIS — I4819 Other persistent atrial fibrillation: Secondary | ICD-10-CM | POA: Diagnosis not present

## 2022-04-13 DIAGNOSIS — E782 Mixed hyperlipidemia: Secondary | ICD-10-CM | POA: Diagnosis not present

## 2022-04-13 DIAGNOSIS — R42 Dizziness and giddiness: Secondary | ICD-10-CM | POA: Diagnosis not present

## 2022-04-13 LAB — ECHOCARDIOGRAM COMPLETE
Area-P 1/2: 2.02 cm2
P 1/2 time: 723 msec
S' Lateral: 4.3 cm

## 2022-04-14 ENCOUNTER — Ambulatory Visit
Admission: RE | Admit: 2022-04-14 | Discharge: 2022-04-14 | Disposition: A | Discharge status: Home / Self Care | Payer: Medicare Other | Source: Ambulatory Visit | Attending: Family Medicine | Admitting: Family Medicine

## 2022-04-14 DIAGNOSIS — M47817 Spondylosis without myelopathy or radiculopathy, lumbosacral region: Secondary | ICD-10-CM | POA: Diagnosis not present

## 2022-04-14 DIAGNOSIS — M5416 Radiculopathy, lumbar region: Secondary | ICD-10-CM

## 2022-04-14 MED ORDER — METHYLPREDNISOLONE ACETATE 40 MG/ML INJ SUSP (RADIOLOG
80.0000 mg | Freq: Once | INTRAMUSCULAR | Status: AC
  Administered 2022-04-14: 80 mg via EPIDURAL

## 2022-04-14 MED ORDER — IOPAMIDOL (ISOVUE-M 200) INJECTION 41%
1.0000 mL | Freq: Once | INTRAMUSCULAR | Status: AC
  Administered 2022-04-14: 1 mL via EPIDURAL

## 2022-04-14 NOTE — Discharge Instructions (Signed)
Post Procedure Spinal Discharge Instruction Sheet ? ?You may resume a regular diet and any medications that you routinely take (including pain medications) unless otherwise noted by MD. ? ?No driving day of procedure. ? ?Light activity throughout the rest of the day.  Do not do any strenuous work, exercise, bending or lifting.  The day following the procedure, you can resume normal physical activity but you should refrain from exercising or physical therapy for at least three days thereafter. ? ?You may apply ice to the injection site, 20 minutes on, 20 minutes off, as needed. Do not apply ice directly to skin.  ? ? ?Common Side Effects: ? ?Headaches- take your usual medications as directed by your physician.  Increase your fluid intake.  Caffeinated beverages may be helpful.  Lie flat in bed until your headache resolves. ? ?Restlessness or inability to sleep- you may have trouble sleeping for the next few days.  Ask your referring physician if you need any medication for sleep. ? ?Facial flushing or redness- should subside within a few days. ? ?Increased pain- a temporary increase in pain a day or two following your procedure is not unusual.  Take your pain medication as prescribed by your referring physician. ? ?Leg cramps ? ?Please contact our office at [phone] for the following symptoms: ?Fever greater than 100 degrees. ?Headaches unresolved with medication after 2-3 days. ?Increased swelling, pain, or redness at injection site. ? ? ?Thank you for visiting [facility] today.   ? ?YOU MAY RESUME YOUR ASPIRIN ANYTIME AFTER PROCEDURE TODAY ? ?YOU MAY RESUME YOUR ELIQUIS 24 HOURS AFTER PROCURE.  ?

## 2022-04-17 DIAGNOSIS — R42 Dizziness and giddiness: Secondary | ICD-10-CM | POA: Diagnosis not present

## 2022-04-17 DIAGNOSIS — I4819 Other persistent atrial fibrillation: Secondary | ICD-10-CM

## 2022-04-29 DIAGNOSIS — R3914 Feeling of incomplete bladder emptying: Secondary | ICD-10-CM | POA: Diagnosis not present

## 2022-04-29 DIAGNOSIS — N401 Enlarged prostate with lower urinary tract symptoms: Secondary | ICD-10-CM | POA: Diagnosis not present

## 2022-04-29 DIAGNOSIS — R351 Nocturia: Secondary | ICD-10-CM | POA: Diagnosis not present

## 2022-05-10 NOTE — Progress Notes (Unsigned)
[place] Phone: [phone] Subjective:   Gregory Shepherd, am serving as a scribe for Dr. Hulan Saas.   I'm seeing this patient by the request  of:  Gregory Ruths, MD  CC: low back pain   [id number]  03/26/2022 Worsening pain and symptoms with Shepherd benefit from the epidural. Last MRI was 76 years old and will repeat to see if any other progression could be contributing to symptoms and if this would change management,  F/u after imaging   Update 05/11/2022 Gregory Shepherd is a 76 y.o. male coming in with complaint of lumbar spine pain. Epidural at L3-L4 after MRI in April of this year showed worsening arthritic changes and nerve impingement on 04/14/2022. Back pain is better but 2 weeks later his symptoms in leg returned. Patient states that he is better but if he stands up for a prolonged period his leg will go numb. Also c/o heel pain on R foot.   L hand continues to be weak and numb. Unable to perform ADLs. Shepherd change in symptoms. Is wearing compression sleeve but unsure if it is the correct one.   Is going to try to get SAS shoes after this visit.        Past Medical History:  Diagnosis Date   Carotid artery narrowing    History of blood clots    History of colon polyps    Hyperlipidemia    Hypertension    MI (myocardial infarction) [facility])    Past Surgical History:  Procedure Laterality Date   ABDOMINAL AORTOGRAM W/LOWER EXTREMITY N/A 06/04/2019   Procedure: ABDOMINAL AORTOGRAM W/LOWER EXTREMITY;  Surgeon: Lorretta Harp, MD;  Location: [place] [facility];  Service: Cardiovascular;  Laterality: N/A;   BIOPSY  02/08/2022   Procedure: BIOPSY;  Surgeon: Thornton Park, MD;  [facility]: [facility];  Service: Gastroenterology;;   CARDIAC SURGERY     x2    CARDIOVERSION N/A 08/25/2021   Procedure: CARDIOVERSION;  Surgeon: Skeet Latch, MD;  Location: [place];  Service: Cardiovascular;   Laterality: N/A;   CARDIOVERSION N/A 11/23/2021   Procedure: CARDIOVERSION;  Surgeon: Thayer Headings, MD;  Location: [facility] ENDOSCOPY;  Service: Cardiovascular;  Laterality: N/A;   COLONOSCOPY     Age 89. [facility]   COLONOSCOPY WITH PROPOFOL N/A 02/08/2022   Procedure: COLONOSCOPY WITH PROPOFOL;  Surgeon: Thornton Park, MD;  [facility];  Service: Gastroenterology;  Laterality: N/A;   CORONARY ARTERY BYPASS GRAFT  1990   x 2 at [facility], redo x 3 @ [place] approx 1998   ESOPHAGOGASTRODUODENOSCOPY (EGD) WITH PROPOFOL N/A 02/08/2022   Procedure: ESOPHAGOGASTRODUODENOSCOPY (EGD) WITH PROPOFOL;  Surgeon: Thornton Park, MD;  [facility];  Service: Gastroenterology;  Laterality: N/A;   PERIPHERAL VASCULAR ATHERECTOMY Right 06/04/2019   Procedure: PERIPHERAL VASCULAR ATHERECTOMY;  Surgeon: Lorretta Harp, MD;  Location: [place] [facility];  Service: Cardiovascular;  Laterality: Right;  common iliac   PERIPHERAL VASCULAR INTERVENTION Right 06/04/2019   Procedure: PERIPHERAL VASCULAR INTERVENTION;  Surgeon: Lorretta Harp, MD;  Location: [place] [facility];  Service: Cardiovascular;  Laterality: Right;  common iliac   POLYPECTOMY  02/08/2022   Procedure: POLYPECTOMY;  Surgeon: Thornton Park, MD;  [facility];  Service: Gastroenterology;;   TEE WITHOUT CARDIOVERSION N/A 08/25/2021   Procedure: TRANSESOPHAGEAL ECHOCARDIOGRAM (TEE);  Surgeon: Skeet Latch, MD;  Location: [place];  Service: Cardiovascular;  Laterality: N/A;  TEE WITHOUT CARDIOVERSION N/A 11/23/2021   Procedure: TRANSESOPHAGEAL ECHOCARDIOGRAM (TEE);  Surgeon: Acie Fredrickson, Wonda Cheng, MD;  Location: [facility] ENDOSCOPY;  Service: Cardiovascular;  Laterality: N/A;   Social History   Socioeconomic History   Marital status: Married    Spouse name: Not on file   Number of children: Not on file   Years of education: Not on file   Highest education level: Not on file  Occupational  History   Not on file  Tobacco Use   Smoking status: Every Day    Types: Cigarettes   Smokeless tobacco: Never   Tobacco comments:    8-9 cigarettes per day  Vaping Use   Vaping Use: Never used  Substance and Sexual Activity   Alcohol use: Shepherd   Drug use: Shepherd   Sexual activity: Not on file  Other Topics Concern   Not on file  Social History Narrative   Not on file   Social Determinants of Health   Financial Resource Strain: Not on file  Food Insecurity: Not on file  Transportation Needs: Not on file  Physical Activity: Not on file  Stress: Not on file  Social Connections: Not on file   Allergies  Allergen Reactions   Omeprazole     doesn't work well with plavix    Other Palpitations    Certain steroids give patient heart palpitations  Allergy to green peppers   Family History  Problem Relation Age of Onset   Heart disease Mother    Heart disease Father    Diabetes Sister    Prostate cancer Neg Hx    Bladder Cancer Neg Hx    Kidney cancer Neg Hx    Colon cancer Neg Hx    Esophageal cancer Neg Hx      Current Outpatient Medications (Cardiovascular):    amiodarone (PACERONE) 200 MG tablet, Take 0.5 tablets (100 mg total) by mouth daily.   atorvastatin (LIPITOR) 80 MG tablet, Take 80 mg by mouth daily.   carvedilol (COREG) 3.125 MG tablet, Take 1 tablet (3.125 mg total) by mouth 2 (two) times daily.   isosorbide mononitrate (IMDUR) 60 MG 24 hr tablet, Take 60 mg by mouth daily.   nitroGLYCERIN (NITROSTAT) 0.4 MG SL tablet, Place 1 tablet (0.4 mg total) under the tongue as needed for chest pain.   torsemide (DEMADEX) 20 MG tablet, Take 2 tablets (40 mg total) by mouth 2 (two) times daily.  Current Outpatient Medications (Respiratory):    BREO ELLIPTA 100-25 MCG/INH AEPB, Inhale 1 puff into the lungs daily as needed (Shortness of breath). As needed.  Current Outpatient Medications (Analgesics):    aspirin 81 MG chewable tablet, Chew 81 mg by mouth  daily.  Current Outpatient Medications (Hematological):    apixaban (ELIQUIS) 5 MG TABS tablet, TAKE 1 TABLET BY MOUTH TWICE A DAY   ferrous sulfate 325 (65 FE) MG tablet, Take 325 mg by mouth every other day.   vitamin B-12 (CYANOCOBALAMIN) 1000 MCG tablet, Take 1,000 mcg by mouth daily.  Current Outpatient Medications (Other):    AMBULATORY NON FORMULARY MEDICATION, 1 Units by Other route once for 1 dose.   Cholecalciferol (VITAMIN D) 50 MCG (2000 UT) CAPS, Take 4,000 Units by mouth daily.   finasteride (PROSCAR) 5 MG tablet, Take 1 tablet (5 mg total) by mouth daily.   gabapentin (NEURONTIN) 300 MG capsule, Take 300 mg by mouth in the morning, at noon, in the evening, and at bedtime.   lansoprazole (PREVACID) 30 MG capsule, Take  1 capsule (30 mg total) by mouth daily.   Melatonin 10 MG TABS, Take 10 mg by mouth at bedtime.   Multiple Vitamin (MULTIVITAMIN) tablet, Take 1 tablet by mouth daily.   potassium chloride (KLOR-CON) 10 MEQ tablet, Take 2 tablets (20 mEq total) by mouth 2 (two) times daily.   traZODone (DESYREL) 50 MG tablet, Take 0.5 tablets (25 mg total) by mouth 2 (two) times daily. Take 25 tablets by mouth 2 (two) times daily. (Patient taking differently: Take 25 mg by mouth at bedtime.)   vitamin C (ASCORBIC ACID) 500 MG tablet, Take 500 mg by mouth daily.   Reviewed prior external information including notes and imaging from  primary care provider As well as notes that were available from care everywhere and other healthcare systems.  Past medical history, social, surgical and family history all reviewed in electronic medical record.  Shepherd pertanent information unless stated regarding to the chief complaint.   Review of Systems:  Shepherd headache, visual changes, nausea, vomiting, diarrhea, constipation, dizziness, abdominal pain, skin rash, fevers, chills, night sweats, weight loss, swollen lymph nodes, body aches, joint swelling, chest pain, shortness of breath, mood changes.  POSITIVE muscle aches  Objective  Blood pressure 120/64, pulse (!) 56, height '6\' 1"'$  (1.854 m), weight 181 lb (82.1 kg), SpO2 98 %.   General: Shepherd apparent distress alert and oriented x3 mood and affect normal, dressed appropriately.  HEENT: Pupils equal, extraocular movements intact  Respiratory: Patient's speak in full sentences and does not appear short of breath  Cardiovascular: Shepherd lower extremity edema, non tender, Shepherd erythema  Gait normal with good balance and coordination.  MSK: Back exam does have some loss of lordosis.  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  Patient does have tightness noted with straight leg test.  Patient does have breakdown of the transverse arch and does have overpronation of the hindfoot noted.  Left hand does have considerable weakness compared to the contralateral side.  This seems to be somewhat of the intrinsic muscles but patient does have some mild thenar and hypothenar eminence wasting noted.    Impression and Recommendations:     The above documentation has been reviewed and is accurate and complete Lyndal Pulley, DO

## 2022-05-11 ENCOUNTER — Ambulatory Visit: Payer: Medicare Other | Admitting: Family Medicine

## 2022-05-11 VITALS — BP 120/64 | HR 56 | Ht 73.0 in | Wt 181.0 lb

## 2022-05-11 DIAGNOSIS — I739 Peripheral vascular disease, unspecified: Secondary | ICD-10-CM

## 2022-05-11 DIAGNOSIS — M216X9 Other acquired deformities of unspecified foot: Secondary | ICD-10-CM

## 2022-05-11 DIAGNOSIS — R29898 Other symptoms and signs involving the musculoskeletal system: Secondary | ICD-10-CM | POA: Diagnosis not present

## 2022-05-11 DIAGNOSIS — M5416 Radiculopathy, lumbar region: Secondary | ICD-10-CM | POA: Diagnosis not present

## 2022-05-11 DIAGNOSIS — M48062 Spinal stenosis, lumbar region with neurogenic claudication: Secondary | ICD-10-CM

## 2022-05-11 DIAGNOSIS — D509 Iron deficiency anemia, unspecified: Secondary | ICD-10-CM

## 2022-05-11 MED ORDER — AMBULATORY NON FORMULARY MEDICATION: 1.0000 [IU] | Freq: Once | 0 refills | Status: AC

## 2022-05-11 NOTE — Assessment & Plan Note (Signed)
On iron supplementation and is feeling somewhat better.  Iron levels have improved.

## 2022-05-11 NOTE — Assessment & Plan Note (Signed)
Patient states that his back pain is better but unfortunately the leg pain only stayed away for approximately 1 week.  Did feel though that it did make an improvement.  Would like to repeat the epidural at L3-L4.  Patient will see how he responds again.  Will need to likely hold his blood thinner again for 5 days which she has done previously.  Follow-up again with me in 6 weeks later.  Total time reviewing other work-up and decisions including medications 33 minutes

## 2022-05-11 NOTE — Assessment & Plan Note (Signed)
Left hand weakness noted.  The patient has good capillary refill so I do not think it is secondary to this.  We discussed with patient about the neuropathy this could be more peripheral or the ulnar neuropathy that was noted in the nerve conduction study.  Patient does have internal weakness.  Patient denies any headaches, dizziness, lightheadedness.  Discussed the possibility of that if this continues we may need to consider MRI of the brain.  Follow-up with me again after doing the home exercises which patient declined occupational therapy in 6 weeks to see how patient is doing otherwise consider the possibility of a ulnar nerve injection.

## 2022-05-11 NOTE — Patient Instructions (Addendum)
Long shoe horn Rx for shoes for peripheral neuropathy Same epidural [phone] Squeeze ball, pull on rubber band See me 6 weeks after epidural

## 2022-05-11 NOTE — Assessment & Plan Note (Signed)
Still be possible consideration to some of the leg discomfort as well that we will need to continue to monitor the

## 2022-05-11 NOTE — Assessment & Plan Note (Signed)
Some breakdown is likely genetic but also potentially from patient's spinal stenosis and peripheral neuropathy.  We will get custom orthotics made for individual to help with some stability as well and we discussed potential shoe choices.  Prescription given today

## 2022-05-14 DIAGNOSIS — R072 Precordial pain: Secondary | ICD-10-CM

## 2022-05-17 MED ORDER — NITROGLYCERIN 0.4 MG SL SUBL: 0.4000 mg | SUBLINGUAL_TABLET | SUBLINGUAL | 3 refills | Status: DC | PRN

## 2022-05-18 ENCOUNTER — Ambulatory Visit
Admission: RE | Admit: 2022-05-18 | Discharge: 2022-05-18 | Disposition: A | Discharge status: Home / Self Care | Payer: Medicare Other | Source: Ambulatory Visit | Attending: Family Medicine | Admitting: Family Medicine

## 2022-05-18 DIAGNOSIS — M47817 Spondylosis without myelopathy or radiculopathy, lumbosacral region: Secondary | ICD-10-CM | POA: Diagnosis not present

## 2022-05-18 DIAGNOSIS — M5416 Radiculopathy, lumbar region: Secondary | ICD-10-CM

## 2022-05-18 MED ORDER — METHYLPREDNISOLONE ACETATE 40 MG/ML INJ SUSP (RADIOLOG: 80.0000 mg | Freq: Once | INTRAMUSCULAR | Status: DC

## 2022-05-18 MED ORDER — IOPAMIDOL (ISOVUE-M 200) INJECTION 41%
18.0000 mL | Freq: Once | INTRAMUSCULAR | Status: DC
Start: 2022-05-18 — End: 2022-05-19

## 2022-05-18 NOTE — Discharge Instructions (Signed)
Post Procedure Spinal Discharge Instruction Sheet  You may resume a regular diet and any medications that you routinely take (including pain medications) unless otherwise noted by MD.  No driving day of procedure.  Light activity throughout the rest of the day.  Do not do any strenuous work, exercise, bending or lifting.  The day following the procedure, you can resume normal physical activity but you should refrain from exercising or physical therapy for at least three days thereafter.  You may apply ice to the injection site, 20 minutes on, 20 minutes off, as needed. Do not apply ice directly to skin.    Common Side Effects:  Headaches- take your usual medications as directed by your physician.  Increase your fluid intake.  Caffeinated beverages may be helpful.  Lie flat in bed until your headache resolves.  Restlessness or inability to sleep- you may have trouble sleeping for the next few days.  Ask your referring physician if you need any medication for sleep.  Facial flushing or redness- should subside within a few days.  Increased pain- a temporary increase in pain a day or two following your procedure is not unusual.  Take your pain medication as prescribed by your referring physician.  Leg cramps  Please contact our office at [phone] for the following symptoms: Fever greater than 100 degrees. Headaches unresolved with medication after 2-3 days. Increased swelling, pain, or redness at injection site.   YOU MAY RESUME YOUR ASPIRIN ANYTIME AFTER PROCEDURE TODAY   YOU MAY RESUME YOUR ELIQUIS 24 HOURS AFTER PROCEDURE   Thank you for visiting [facility] today.

## 2022-05-25 ENCOUNTER — Other Ambulatory Visit: Payer: Self-pay | Admitting: Student

## 2022-07-14 ENCOUNTER — Ambulatory Visit: Payer: Medicare Other | Admitting: Family Medicine

## 2022-07-14 ENCOUNTER — Ambulatory Visit: Payer: Self-pay

## 2022-07-14 VITALS — BP 118/66 | HR 55 | Ht 73.0 in | Wt 189.0 lb

## 2022-07-14 DIAGNOSIS — M2550 Pain in unspecified joint: Secondary | ICD-10-CM

## 2022-07-14 DIAGNOSIS — G5602 Carpal tunnel syndrome, left upper limb: Secondary | ICD-10-CM | POA: Diagnosis not present

## 2022-07-14 DIAGNOSIS — G5622 Lesion of ulnar nerve, left upper limb: Secondary | ICD-10-CM | POA: Insufficient documentation

## 2022-07-14 DIAGNOSIS — M25522 Pain in left elbow: Secondary | ICD-10-CM | POA: Diagnosis not present

## 2022-07-14 DIAGNOSIS — E538 Deficiency of other specified B group vitamins: Secondary | ICD-10-CM

## 2022-07-14 LAB — IBC PANEL
Iron: 66 ug/dL (ref 42–165)
Saturation Ratios: 20.4 % (ref 20.0–50.0)
TIBC: 323.4 ug/dL (ref 250.0–450.0)
Transferrin: 231 mg/dL (ref 212.0–360.0)

## 2022-07-14 LAB — FERRITIN: Ferritin: 39.2 ng/mL (ref 22.0–322.0)

## 2022-07-14 MED ORDER — CYANOCOBALAMIN 1000 MCG/ML IJ SOLN
1000.0000 ug | Freq: Once | INTRAMUSCULAR | Status: AC
  Administered 2022-07-14: 1000 ug via INTRAMUSCULAR

## 2022-07-14 NOTE — Patient Instructions (Signed)
Injection in wrist and elbow today

## 2022-07-14 NOTE — Progress Notes (Signed)
[place] Phone: [phone] Subjective:   [name], am serving as a scribe for Dr. Hulan Saas.  I'm seeing this patient by the request  of:  Kirk Ruths, MD  CC: low back and leg pain, elbow pain follow up   [id number]  Gregory Shepherd is a 76 y.o. male coming in with complaint of low back and leg pain.  Does have severe arthritic changes with nerve impingement of the lower back.  Patient did have a another epidural injection this year. Back pain has improved. Numbness in hip flexor and in R heel.   Patient continues to have left elbow pain.  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  Patient states his entire L hand is numb.          Past Medical History:  Diagnosis Date   Carotid artery narrowing    History of blood clots    History of colon polyps    Hyperlipidemia    Hypertension    MI (myocardial infarction) [facility])    Past Surgical History:  Procedure Laterality Date   ABDOMINAL AORTOGRAM W/LOWER EXTREMITY N/A 06/04/2019   Procedure: ABDOMINAL AORTOGRAM W/LOWER EXTREMITY;  Surgeon: Lorretta Harp, MD;  Location: [place] [facility];  Service: Cardiovascular;  Laterality: N/A;   BIOPSY  02/08/2022   Procedure: BIOPSY;  Surgeon: Thornton Park, MD;  [facility];  Service: Gastroenterology;;   CARDIAC SURGERY     x2    CARDIOVERSION N/A 08/25/2021   Procedure: CARDIOVERSION;  Surgeon: Skeet Latch, MD;  Location: [place];  Service: Cardiovascular;  Laterality: N/A;   CARDIOVERSION N/A 11/23/2021   Procedure: CARDIOVERSION;  Surgeon: Thayer Headings, MD;  Location: [facility] ENDOSCOPY;  Service: Cardiovascular;  Laterality: N/A;   COLONOSCOPY     Age 8. [facility]   COLONOSCOPY WITH PROPOFOL N/A 02/08/2022   Procedure: COLONOSCOPY WITH PROPOFOL;  Surgeon: Thornton Park, MD;  [facility];   Service: Gastroenterology;  Laterality: N/A;   CORONARY ARTERY BYPASS GRAFT  1990   x 2 at [facility], redo x 3 @ [place] approx 1998   ESOPHAGOGASTRODUODENOSCOPY (EGD) WITH PROPOFOL N/A 02/08/2022   Procedure: ESOPHAGOGASTRODUODENOSCOPY (EGD) WITH PROPOFOL;  Surgeon: Thornton Park, MD;  [facility];  Service: Gastroenterology;  Laterality: N/A;   PERIPHERAL VASCULAR ATHERECTOMY Right 06/04/2019   Procedure: PERIPHERAL VASCULAR ATHERECTOMY;  Surgeon: Lorretta Harp, MD;  Location: [place] [facility];  Service: Cardiovascular;  Laterality: Right;  common iliac   PERIPHERAL VASCULAR INTERVENTION Right 06/04/2019   Procedure: PERIPHERAL VASCULAR INTERVENTION;  Surgeon: Lorretta Harp, MD;  Location: [place] [facility];  Service: Cardiovascular;  Laterality: Right;  common iliac   POLYPECTOMY  02/08/2022   Procedure: POLYPECTOMY;  Surgeon: Thornton Park, MD;  [facility];  Service: Gastroenterology;;   TEE WITHOUT CARDIOVERSION N/A 08/25/2021   Procedure: TRANSESOPHAGEAL ECHOCARDIOGRAM (TEE);  Surgeon: Skeet Latch, MD;  Location: [place];  Service: Cardiovascular;  Laterality: N/A;   TEE WITHOUT CARDIOVERSION N/A 11/23/2021   Procedure: TRANSESOPHAGEAL ECHOCARDIOGRAM (TEE);  Surgeon: Acie Fredrickson Wonda Cheng, MD;  Location: [facility] ENDOSCOPY;  Service: Cardiovascular;  Laterality: N/A;   Social History   Socioeconomic History   Marital status: Married    Spouse name: Not on file   Number of children: Not on file   Years of education: Not on file   Highest education level: Not on file  Occupational History   Not on file  Tobacco Use   Smoking status: Every Day    Types: Cigarettes   Smokeless tobacco: Never   Tobacco comments:    8-9 cigarettes per day  Vaping Use   Vaping Use: Never used  Substance and Sexual Activity   Alcohol use: No   Drug use: No   Sexual activity: Not on file  Other Topics Concern   Not on file  Social History Narrative   Not on file    Social Determinants of Health   Financial Resource Strain: Not on file  Food Insecurity: Not on file  Transportation Needs: Not on file  Physical Activity: Not on file  Stress: Not on file  Social Connections: Not on file   Allergies  Allergen Reactions   Omeprazole     doesn't work well with plavix    Other Palpitations    Certain steroids give patient heart palpitations  Allergy to green peppers   Family History  Problem Relation Age of Onset   Heart disease Mother    Heart disease Father    Diabetes Sister    Prostate cancer Neg Hx    Bladder Cancer Neg Hx    Kidney cancer Neg Hx    Colon cancer Neg Hx    Esophageal cancer Neg Hx      Current Outpatient Medications (Cardiovascular):    amiodarone (PACERONE) 200 MG tablet, Take 0.5 tablets (100 mg total) by mouth daily.   atorvastatin (LIPITOR) 80 MG tablet, Take 80 mg by mouth daily.   carvedilol (COREG) 3.125 MG tablet, TAKE 1 TABLET BY MOUTH TWICE A DAY   isosorbide mononitrate (IMDUR) 60 MG 24 hr tablet, Take 60 mg by mouth daily.   nitroGLYCERIN (NITROSTAT) 0.4 MG SL tablet, Place 1 tablet (0.4 mg total) under the tongue as needed for chest pain.   torsemide (DEMADEX) 20 MG tablet, Take 2 tablets (40 mg total) by mouth 2 (two) times daily.  Current Outpatient Medications (Respiratory):    BREO ELLIPTA 100-25 MCG/INH AEPB, Inhale 1 puff into the lungs daily as needed (Shortness of breath). As needed.  Current Outpatient Medications (Analgesics):    aspirin 81 MG chewable tablet, Chew 81 mg by mouth daily.  Current Outpatient Medications (Hematological):    apixaban (ELIQUIS) 5 MG TABS tablet, TAKE 1 TABLET BY MOUTH TWICE A DAY   ferrous sulfate 325 (65 FE) MG tablet, Take 325 mg by mouth every other day.   vitamin B-12 (CYANOCOBALAMIN) 1000 MCG tablet, Take 1,000 mcg by mouth daily.  Current Outpatient Medications (Other):    Cholecalciferol (VITAMIN D) 50 MCG (2000 UT) CAPS, Take 4,000 Units by mouth  daily.   finasteride (PROSCAR) 5 MG tablet, Take 1 tablet (5 mg total) by mouth daily.   gabapentin (NEURONTIN) 300 MG capsule, Take 300 mg by mouth in the morning, at noon, in the evening, and at bedtime.   lansoprazole (PREVACID) 30 MG capsule, Take 1 capsule (30 mg total) by mouth daily.   Melatonin 10 MG TABS, Take 10 mg by mouth at bedtime.   Multiple Vitamin (MULTIVITAMIN) tablet, Take 1 tablet by mouth daily.   potassium chloride (KLOR-CON) 10 MEQ tablet, Take 2 tablets (20 mEq total) by mouth 2 (two) times daily.   traZODone (DESYREL) 50 MG tablet, Take 0.5 tablets (25 mg total) by mouth 2 (two) times daily. Take 25 tablets by mouth 2 (two) times daily. (Patient taking differently: Take 25 mg by mouth at bedtime.)  vitamin C (ASCORBIC ACID) 500 MG tablet, Take 500 mg by mouth daily.   Reviewed prior external information including notes and imaging from  primary care provider As well as notes that were available from care everywhere and other healthcare systems.  Past medical history, social, surgical and family history all reviewed in electronic medical record.  No pertanent information unless stated regarding to the chief complaint.   Review of Systems:  No headache, visual changes, nausea, vomiting, diarrhea, constipation, dizziness, abdominal pain, skin rash, fevers, chills, night sweats, weight loss, swollen lymph nodes, body aches, joint swelling, chest pain, shortness of breath, mood changes. POSITIVE muscle aches  Objective  There were no vitals taken for this visit.   General: No apparent distress alert and oriented x3 mood and affect normal, dressed appropriately.  HEENT: Pupils equal, extraocular movements intact  Respiratory: Patient's speak in full sentences and does not appear short of breath  Cardiovascular: No lower extremity edema, non tender, no erythema  Regarding patient's left hand does have some thenar eminence wasting noted.  Weakness with grip strength  noted.  Positive Tinel's over the ulnar nerve at the elbow as well is over the carpal tunnel.  Procedure: Real-time Ultrasound Guided Injection of left carpal tunnel Device: GE Logiq Q7 Ultrasound guided injection is preferred based studies that show increased duration, increased effect, greater accuracy, decreased procedural pain, increased response rate with ultrasound guided versus blind injection.  Verbal informed consent obtained.  Time-out conducted.  Noted no overlying erythema, induration, or other signs of local infection.  Skin prepped in a sterile fashion.  Local anesthesia: Topical Ethyl chloride.  With sterile technique and under real time ultrasound guidance:  median nerve visualized.  23g 5/8 inch needle inserted distal to proximal approach into nerve sheath. Pictures taken nfor needle placement. Patient did have injection of 0.5 cc of 0.5% Marcaine, and 0.5 cc of Kenalog 40 mg/dL. Completed without difficulty .  Advised to call if fevers/chills, erythema, induration, drainage, or persistent bleeding.   Impression: Technically successful ultrasound guided injection.  Procedure: Real-time Ultrasound Guided Injection of ulnar nerve sheath Device: GE Logiq Q7 Ultrasound guided injection is preferred based studies that show increased duration, increased effect, greater accuracy, decreased procedural pain, increased response rate, and decreased cost with ultrasound guided versus blind injection.  Verbal informed consent obtained.  Time-out conducted.  Noted no overlying erythema, induration, or other signs of local infection.  Skin prepped in a sterile fashion.  Local anesthesia: Topical Ethyl chloride.  With sterile technique and under real time ultrasound guidance: With a 25-gauge half inch needle injected into the nerve sheath at the elbow with a total of 0.5 cc of 0.5% Marcaine and 0.5 cc of Kenalog 40 mg/mL. Completed without difficulty  Pain immediately improved in the hand  suggesting accurate placement of the medication.  Advised to call if fevers/chills, erythema, induration, drainage, or persistent bleeding.  Impression: Technically successful ultrasound guided injection.    Impression and Recommendations:    The above documentation has been reviewed and is accurate and complete Lyndal Pulley, DO

## 2022-07-14 NOTE — Assessment & Plan Note (Signed)
Even though patient is nerve conduction study did not show findings patient does have weakness noted of the left hand.  Discussed with patient at great length different treatment options and elected to try the injection.  Patient hopefully will make some improvement.  Continue bracing at night.  Follow-up again in 6 weeks otherwise.

## 2022-07-14 NOTE — Assessment & Plan Note (Signed)
Patient tolerated the procedure extremely well.  Patient also had conduction test that shows some ulnar neuropathy noted.  Hopefully this will make some improvement with cellulitis patient has as well as the weakness in the hand.  Can consider the possibility of decompression surgery but to be honest the patient does have have significant other medical problems that could be contributing as well.

## 2022-08-05 NOTE — Telephone Encounter (Signed)
Called and spoke with patient who states that the last few days he has been having elevated blood pressures. Patient states that today his BP was 190/90 and has been as high as 208 systolic over the last few days (around 4 days). Patient states that he has not done anything differently and reports that he has been taking all of his medications as prescribed. Patient denies any chest pain, shortness of breath, or lightheadedness or any stroke like symptoms. Patient states that he has had a little dizziness at times but nothing out of the ordinary for him and reports at current he feels fine. Patient denies any increase in stress, sodium intake or any pain. Patient states that he just wanted to know what he should do about his Blood pressure and the sudden increase over the last several days. Scheduled patient an appointment tomorrow with DOD (Dr. Gardiner Rhyme) to be evaluated and advised patient to bring BP cuff with him. Educated patient on symptoms to watch for and ER precautions and patient verbalized understanding of all instructions. Will forward to MD to make aware.

## 2022-08-06 ENCOUNTER — Encounter: Payer: Self-pay | Admitting: Cardiology

## 2022-08-06 ENCOUNTER — Ambulatory Visit: Payer: Medicare Other | Attending: Cardiology | Admitting: Cardiology

## 2022-08-06 VITALS — BP 142/58 | HR 54 | Ht 73.0 in | Wt 189.8 lb

## 2022-08-06 DIAGNOSIS — I10 Essential (primary) hypertension: Secondary | ICD-10-CM | POA: Diagnosis not present

## 2022-08-06 DIAGNOSIS — I2510 Atherosclerotic heart disease of native coronary artery without angina pectoris: Secondary | ICD-10-CM

## 2022-08-06 DIAGNOSIS — I4819 Other persistent atrial fibrillation: Secondary | ICD-10-CM | POA: Diagnosis not present

## 2022-08-06 DIAGNOSIS — I5042 Chronic combined systolic (congestive) and diastolic (congestive) heart failure: Secondary | ICD-10-CM | POA: Diagnosis not present

## 2022-08-06 MED ORDER — LOSARTAN POTASSIUM 25 MG PO TABS: 25.0000 mg | ORAL_TABLET | Freq: Every day | ORAL | 3 refills | Status: DC

## 2022-08-06 NOTE — Progress Notes (Signed)
Cardiology Office Note:    Date:  08/06/2022   ID:  Gregory Shepherd, Gregory Shepherd 09/12/1946, MRN [id number]  PCP:  Kirk Ruths, MD  Cardiologist:  Quay Burow, MD  Electrophysiologist:  Vickie Epley, MD   Referring MD: Kirk Ruths, MD   Chief Complaint  Patient presents with   Hypertension    History of Present Illness:    Gregory Shepherd is a 76 y.o. male with a hx of CAD status post CABG at [facility] in 1990 and redo CABG in 1998, chronic combined heart failure, PAD, atrial fibrillation, hypertension, hyperlipidemia who presents for acute visit for hypertension.  He follows with Dr. Gwenlyn Found, last seen 03/23/2022.  He called the office on 08/05/2022 and reported was having SBP up to 200, so was scheduled for appointment today.  He reported having headaches but otherwise asymptomatic.  Echocardiogram 04/13/2022 showed EF 45 to 92%, grade 1 diastolic dysfunction, normal RV function, mild MR, strain pattern suggestive of amyloid.    BP Readings from Last 3 Encounters:  08/06/22 (!) 142/58  07/14/22 118/66  05/18/22 (!) 143/55    Wt Readings from Last 3 Encounters:  08/06/22 189 lb 12.8 oz (86.1 kg)  07/14/22 189 lb (85.7 kg)  05/11/22 181 lb (82.1 kg)     Past Medical History:  Diagnosis Date   Carotid artery narrowing    History of blood clots    History of colon polyps    Hyperlipidemia    Hypertension    MI (myocardial infarction) [facility]     Past Surgical History:  Procedure Laterality Date   ABDOMINAL AORTOGRAM W/LOWER EXTREMITY N/A 06/04/2019   Procedure: ABDOMINAL AORTOGRAM W/LOWER EXTREMITY;  Surgeon: Lorretta Harp, MD;  Location: [place] [facility];  Service: Cardiovascular;  Laterality: N/A;   BIOPSY  02/08/2022   Procedure: BIOPSY;  Surgeon: Thornton Park, MD;  Location: [facility];  Service: Gastroenterology;;   CARDIAC SURGERY     x2    CARDIOVERSION N/A 08/25/2021   Procedure: CARDIOVERSION;  Surgeon: Skeet Latch, MD;  Location:  [place];  Service: Cardiovascular;  Laterality: N/A;   CARDIOVERSION N/A 11/23/2021   Procedure: CARDIOVERSION;  Surgeon: Thayer Headings, MD;  Location: [facility] ENDOSCOPY;  Service: Cardiovascular;  Laterality: N/A;   COLONOSCOPY     Age 73. [facility]   COLONOSCOPY WITH PROPOFOL N/A 02/08/2022   Procedure: COLONOSCOPY WITH PROPOFOL;  Surgeon: Thornton Park, MD;  Location: [facility];  Service: Gastroenterology;  Laterality: N/A;   CORONARY ARTERY BYPASS GRAFT  1990   x 2 at [facility], redo x 3 @ [place] approx 1998   ESOPHAGOGASTRODUODENOSCOPY (EGD) WITH PROPOFOL N/A 02/08/2022   Procedure: ESOPHAGOGASTRODUODENOSCOPY (EGD) WITH PROPOFOL;  Surgeon: Thornton Park, MD;  Location: [facility];  Service: Gastroenterology;  Laterality: N/A;   PERIPHERAL VASCULAR ATHERECTOMY Right 06/04/2019   Procedure: PERIPHERAL VASCULAR ATHERECTOMY;  Surgeon: Lorretta Harp, MD;  Location: [place] [facility];  Service: Cardiovascular;  Laterality: Right;  common iliac   PERIPHERAL VASCULAR INTERVENTION Right 06/04/2019   Procedure: PERIPHERAL VASCULAR INTERVENTION;  Surgeon: Lorretta Harp, MD;  Location: [place] [facility];  Service: Cardiovascular;  Laterality: Right;  common iliac   POLYPECTOMY  02/08/2022   Procedure: POLYPECTOMY;  Surgeon: Thornton Park, MD;  Location: [facility];  Service: Gastroenterology;;   TEE WITHOUT CARDIOVERSION N/A 08/25/2021   Procedure: TRANSESOPHAGEAL ECHOCARDIOGRAM (TEE);  Surgeon: Skeet Latch, MD;  Location: [place];  Service: Cardiovascular;  Laterality: N/A;   TEE  WITHOUT CARDIOVERSION N/A 11/23/2021   Procedure: TRANSESOPHAGEAL ECHOCARDIOGRAM (TEE);  Surgeon: Thayer Headings, MD;  Location: [facility] ENDOSCOPY;  Service: Cardiovascular;  Laterality: N/A;    Current Medications: Current Meds  Medication Sig   amiodarone (PACERONE) 200 MG tablet Take 0.5 tablets (100 mg total) by mouth daily.   apixaban (ELIQUIS) 5 MG TABS tablet TAKE 1  TABLET BY MOUTH TWICE A DAY   atorvastatin (LIPITOR) 80 MG tablet Take 80 mg by mouth daily.   carvedilol (COREG) 3.125 MG tablet TAKE 1 TABLET BY MOUTH TWICE A DAY   Cholecalciferol (VITAMIN D) 50 MCG (2000 UT) CAPS Take 4,000 Units by mouth daily.   ferrous sulfate 325 (65 FE) MG tablet Take 325 mg by mouth every other day.   finasteride (PROSCAR) 5 MG tablet Take 1 tablet (5 mg total) by mouth daily.   gabapentin (NEURONTIN) 300 MG capsule Take 300 mg by mouth in the morning, at noon, in the evening, and at bedtime.   isosorbide mononitrate (IMDUR) 60 MG 24 hr tablet Take 60 mg by mouth daily.   lansoprazole (PREVACID) 30 MG capsule Take 1 capsule (30 mg total) by mouth daily.   losartan (COZAAR) 25 MG tablet Take 1 tablet (25 mg total) by mouth daily.   Melatonin 10 MG TABS Take 10 mg by mouth at bedtime.   Multiple Vitamin (MULTIVITAMIN) tablet Take 1 tablet by mouth daily.   potassium chloride (KLOR-CON) 10 MEQ tablet Take 2 tablets (20 mEq total) by mouth 2 (two) times daily.   torsemide (DEMADEX) 20 MG tablet Take 2 tablets (40 mg total) by mouth 2 (two) times daily.   traZODone (DESYREL) 50 MG tablet Take 1 tablet by mouth at bedtime.   vitamin B-12 (CYANOCOBALAMIN) 1000 MCG tablet Take 1,000 mcg by mouth daily.   vitamin C (ASCORBIC ACID) 500 MG tablet Take 500 mg by mouth daily.     Allergies:   Omeprazole and Other   Social History   Socioeconomic History   Marital status: Married    Spouse name: Not on file   Number of children: Not on file   Years of education: Not on file   Highest education level: Not on file  Occupational History   Not on file  Tobacco Use   Smoking status: Every Day    Types: Cigarettes   Smokeless tobacco: Never   Tobacco comments:    8-9 cigarettes per day  Vaping Use   Vaping Use: Never used  Substance and Sexual Activity   Alcohol use: No   Drug use: No   Sexual activity: Not on file  Other Topics Concern   Not on file  Social  History Narrative   Not on file   Social Determinants of Health   Financial Resource Strain: Not on file  Food Insecurity: Not on file  Transportation Needs: Not on file  Physical Activity: Not on file  Stress: Not on file  Social Connections: Not on file     Family History: The patient's family history includes Diabetes in his sister; Heart disease in his father and mother. There is no history of Prostate cancer, Bladder Cancer, Kidney cancer, Colon cancer, or Esophageal cancer.  ROS:   Please see the history of present illness.     All other systems reviewed and are negative.  EKGs/Labs/Other Studies Reviewed:    The following studies were reviewed today:   EKG:   08/06/2022: Sinus bradycardia, first-degree AV block, right bundle branch block, left anterior  fascicular block, Q waves in leads III, aVF  Recent Labs: 12/30/2021: NT-Pro BNP 3,211 01/20/2022: BUN 16; Creatinine, Ser 1.38; Magnesium 2.3; Potassium 4.3; Sodium 137 03/23/2022: Hemoglobin 13.4; Platelets 164.0  Recent Lipid Panel No results found for: "CHOL", "TRIG", "HDL", "CHOLHDL", "VLDL", "LDLCALC", "LDLDIRECT"  Physical Exam:    VS:  BP (!) 142/58 (BP Location: Left Arm, Patient Position: Sitting, Cuff Size: Normal)   Pulse (!) 54   Ht '6\' 1"'$  (1.854 m)   Wt 189 lb 12.8 oz (86.1 kg)   SpO2 96%   BMI 25.04 kg/m     Wt Readings from Last 3 Encounters:  08/06/22 189 lb 12.8 oz (86.1 kg)  07/14/22 189 lb (85.7 kg)  05/11/22 181 lb (82.1 kg)     GEN:  Well nourished, well developed in no acute distress HEENT: Normal NECK: No JVD; No carotid bruits CARDIAC: RRR, no murmurs, rubs, gallops RESPIRATORY:  Clear to auscultation without rales, wheezing or rhonchi  ABDOMEN: Soft, non-tender, non-distended MUSCULOSKELETAL:  No edema; No deformity  SKIN: Warm and dry NEUROLOGIC:  Alert and oriented x 3 PSYCHIATRIC:  Normal affect   ASSESSMENT:    1. Chronic combined systolic and diastolic heart failure ([place])    2. Essential hypertension   3. Persistent atrial fibrillation ([place])   4. Coronary artery disease involving native coronary artery of native heart without angina pectoris    PLAN:     Hypertension: On carvedilol 3.125 mg twice daily, Imdur 60 mg daily.  He reports that his SBP was up to 200 when checked at home.  He uses a home wrist monitor, was significantly higher than our reading (home monitor SBP 170s, our BP cuff SBP 140s).  Recommended not using wrist monitor but instead obtaining Omron upper arm monitor.  BP is mildly elevated and with EF reduced, recommend adding losartan 25 mg daily.  Check BMET next week.  Recommend checking BP daily with new monitor for next 2 weeks and calling with results.  Chronic combined heart failure: EF 25 to 30% on TEE 11/2021.  Suspect due to Afib, EF improved with restoring sinus rhythm.  Echocardiogram 04/13/2022 showed EF 45 to 16%, grade 1 diastolic dysfunction, normal RV function, mild MR, strain pattern suggestive of amyloid. -Continue carvedilol, Imdur.  Add losartan as above -Continue torsemide.  Appears euvolemic -Recommend cardiac MRI to evaluate for amyloid  Persistent atrial fibrillation: Maintaining normal sinus rhythm on amiodarone.  Continue Eliquis  CAD: Status post CABG x2.  No anginal symptoms. -Continue Eliquis, statin     Medication Adjustments/Labs and Tests Ordered: Current medicines are reviewed at length with the patient today.  Concerns regarding medicines are outlined above.  Orders Placed This Encounter  Procedures   MR CARDIAC MORPHOLOGY W WO CONTRAST   Basic metabolic panel   Magnesium   EKG 12-Lead   Meds ordered this encounter  Medications   losartan (COZAAR) 25 MG tablet    Sig: Take 1 tablet (25 mg total) by mouth daily.    Dispense:  90 tablet    Refill:  3    Patient Instructions  Medication Instructions:  START Losartan 25 mg daily  Please check your blood pressure at home twice daily, write it down.   Call the office or send message via Mychart with the readings in 2 weeks for Dr. Gardiner Rhyme to review.   *If you need a refill on your cardiac medications before your next appointment, please call your pharmacy*   Lab Work: Please return for  labs next week (BMET, Mag)  Our in office lab hours are Monday-Friday 8:00-4:00, closed for lunch 12:45-1:45 pm.  No appointment needed.  [facility] locations:   [address] [address] (Dr. Kennon Holter office) - [place] ([facility]) - [address] [place] [address] [place] - [address] Oncologist)  Testing/Procedures: Your physician has requested that you have a cardiac MRI. Cardiac MRI uses a computer to create images of your heart as its beating, producing both still and moving pictures of your heart and major blood vessels. For further information please visit [email or web address] Please follow the instruction sheet given to you today for more information.  Follow-Up: At [facility], you and your health needs are our priority.  As part of our continuing mission to provide you with exceptional heart care, we have created designated Provider Care Teams.  These Care Teams include your primary Cardiologist (physician) and Advanced Practice Providers (APPs -  Physician Assistants and Nurse Practitioners) who all work together to provide you with the care you need, when you need it.  We recommend signing up for the patient portal called "MyChart".  Sign up information is provided on this After Visit Summary.  MyChart is used to connect with patients for Virtual Visits (Telemedicine).  Patients are able to view lab/test results, encounter notes, upcoming appointments, etc.   Non-urgent messages can be sent to your provider as well.   To learn more about what you can do with MyChart, go to [email or web address].    Your next appointment:   2 month(s)  The format for your next appointment:   In Person  Provider:   Quay Burow, MD or Almyra Deforest PA  Other Instructions Recommend Omron upper arm blood pressure cuff           Signed, Donato Heinz, MD  08/06/2022 5:17 PM    [place]

## 2022-08-06 NOTE — Patient Instructions (Signed)
Medication Instructions:  START Losartan 25 mg daily  Please check your blood pressure at home twice daily, write it down.  Call the office or send message via Mychart with the readings in 2 weeks for Dr. Gardiner Rhyme to review.   *If you need a refill on your cardiac medications before your next appointment, please call your pharmacy*   Lab Work: Please return for labs next week (BMET, Mag)  Our in office lab hours are Monday-Friday 8:00-4:00, closed for lunch 12:45-1:45 pm.  No appointment needed.  [facility] locations:   [place] [address] (Dr. Kennon Holter office) - [place] ([facility]) - [address] [place] [address] - [address] [place] - [address] Oncologist)  Testing/Procedures: Your physician has requested that you have a cardiac MRI. Cardiac MRI uses a computer to create images of your heart as its beating, producing both still and moving pictures of your heart and major blood vessels. For further information please visit [email or web address] Please follow the instruction sheet given to you today for more information.  Follow-Up: At [facility], you and your health needs are our priority.  As part of our continuing mission to provide you with exceptional heart care, we have created designated Provider Care Teams.  These Care Teams include your primary Cardiologist (physician) and Advanced Practice Providers (APPs -  Physician Assistants and Nurse Practitioners) who all work together to provide you with the care you need, when you need it.  We recommend signing up for the patient portal called "MyChart".  Sign up information is provided on this After Visit Summary.  MyChart is used to connect with patients  for Virtual Visits (Telemedicine).  Patients are able to view lab/test results, encounter notes, upcoming appointments, etc.  Non-urgent messages can be sent to your provider as well.   To learn more about what you can do with MyChart, go to [email or web address].    Your next appointment:   2 month(s)  The format for your next appointment:   In Person  Provider:   Quay Burow, MD or Almyra Deforest PA  Other Instructions Recommend Omron upper arm blood pressure cuff

## 2022-08-07 ENCOUNTER — Other Ambulatory Visit: Payer: Self-pay | Admitting: Cardiovascular Disease

## 2022-08-07 DIAGNOSIS — I4819 Other persistent atrial fibrillation: Secondary | ICD-10-CM

## 2022-08-10 DIAGNOSIS — I5042 Chronic combined systolic (congestive) and diastolic (congestive) heart failure: Secondary | ICD-10-CM | POA: Diagnosis not present

## 2022-08-10 DIAGNOSIS — I10 Essential (primary) hypertension: Secondary | ICD-10-CM | POA: Diagnosis not present

## 2022-08-10 NOTE — Telephone Encounter (Signed)
Prescription refill request for Eliquis received. Indication:Afib Last office visit:9/23 Scr:1.2 Age: 76 Weight:86.1 kg  Prescription refilled

## 2022-08-11 ENCOUNTER — Other Ambulatory Visit: Payer: Self-pay | Admitting: Student

## 2022-08-11 DIAGNOSIS — R072 Precordial pain: Secondary | ICD-10-CM

## 2022-08-11 LAB — MAGNESIUM: Magnesium: 2.2 mg/dL (ref 1.6–2.3)

## 2022-08-11 LAB — BASIC METABOLIC PANEL
BUN/Creatinine Ratio: 10 (ref 10–24)
BUN: 12 mg/dL (ref 8–27)
CO2: 20 mmol/L (ref 20–29)
Calcium: 9.3 mg/dL (ref 8.6–10.2)
Chloride: 104 mmol/L (ref 96–106)
Creatinine, Ser: 1.19 mg/dL (ref 0.76–1.27)
Glucose: 92 mg/dL (ref 70–99)
Potassium: 4.7 mmol/L (ref 3.5–5.2)
Sodium: 142 mmol/L (ref 134–144)
eGFR: 63 mL/min/{1.73_m2} (ref >59)

## 2022-08-19 NOTE — Progress Notes (Unsigned)
[place] Phone: [phone] Subjective:   Gregory Shepherd, am serving as a scribe for Dr. Hulan Saas. I'm seeing this patient by the request  of:  Kirk Ruths, MD  CC: Worsening left arm pain  CBS:[id number]  07/14/2022 Patient tolerated the procedure extremely well.  Patient also had conduction test that shows some ulnar neuropathy noted.  Hopefully this will make some improvement with cellulitis patient has as well as the weakness in the hand.  Can consider the possibility of decompression surgery but to be honest the patient does have have significant other medical problems that could be contributing as well.  Even though patient is nerve conduction study did not show findings patient does have weakness noted of the left hand.  Discussed with patient at great length different treatment options and elected to try the injection.  Patient hopefully will make some improvement.  Continue bracing at night.  Follow-up again in 6 weeks otherwise.  Updated 08/24/2022 Gregory Shepherd is a 76 y.o. male coming in with complaint of left elbow and wrist. Feels that his wrist is getting worse. Had relief from injection for one day. Pain is now traveling up his arm and into the L pec.   Patient also complaining of R foot numbness when he stands. If he walks or sits he will not have these symptoms. Last epidural 05/18/2022.       Past Medical History:  Diagnosis Date   Carotid artery narrowing    History of blood clots    History of colon polyps    Hyperlipidemia    Hypertension    MI (myocardial infarction) [facility])    Past Surgical History:  Procedure Laterality Date   ABDOMINAL AORTOGRAM W/LOWER EXTREMITY N/A 06/04/2019   Procedure: ABDOMINAL AORTOGRAM W/LOWER EXTREMITY;  Surgeon: Lorretta Harp, MD;  Location: [place] [facility];  Service: Cardiovascular;  Laterality: N/A;   BIOPSY  02/08/2022   Procedure:  BIOPSY;  Surgeon: Thornton Park, MD;  Location: [facility];  Service: Gastroenterology;;   CARDIAC SURGERY     x2    CARDIOVERSION N/A 08/25/2021   Procedure: CARDIOVERSION;  Surgeon: Skeet Latch, MD;  Location: [place];  Service: Cardiovascular;  Laterality: N/A;   CARDIOVERSION N/A 11/23/2021   Procedure: CARDIOVERSION;  Surgeon: Thayer Headings, MD;  Location: [facility] ENDOSCOPY;  Service: Cardiovascular;  Laterality: N/A;   COLONOSCOPY     Age 50. [facility]   COLONOSCOPY WITH PROPOFOL N/A 02/08/2022   Procedure: COLONOSCOPY WITH PROPOFOL;  Surgeon: Thornton Park, MD;  Location: [facility];  Service: Gastroenterology;  Laterality: N/A;   CORONARY ARTERY BYPASS GRAFT  1990   x 2 at [facility], redo x 3 @ [place] approx 1998   ESOPHAGOGASTRODUODENOSCOPY (EGD) WITH PROPOFOL N/A 02/08/2022   Procedure: ESOPHAGOGASTRODUODENOSCOPY (EGD) WITH PROPOFOL;  Surgeon: Thornton Park, MD;  Location: [facility];  Service: Gastroenterology;  Laterality: N/A;   PERIPHERAL VASCULAR ATHERECTOMY Right 06/04/2019   Procedure: PERIPHERAL VASCULAR ATHERECTOMY;  Surgeon: Lorretta Harp, MD;  Location: [place] [facility];  Service: Cardiovascular;  Laterality: Right;  common iliac   PERIPHERAL VASCULAR INTERVENTION Right 06/04/2019   Procedure: PERIPHERAL VASCULAR INTERVENTION;  Surgeon: Lorretta Harp, MD;  Location: [place] [facility];  Service: Cardiovascular;  Laterality: Right;  common iliac   POLYPECTOMY  02/08/2022   Procedure: POLYPECTOMY;  Surgeon: Thornton Park, MD;  Location: [facility];  Service: Gastroenterology;;   TEE  WITHOUT CARDIOVERSION N/A 08/25/2021   Procedure: TRANSESOPHAGEAL ECHOCARDIOGRAM (TEE);  Surgeon: Skeet Latch, MD;  Location: [place];  Service: Cardiovascular;  Laterality: N/A;   TEE WITHOUT CARDIOVERSION N/A 11/23/2021   Procedure: TRANSESOPHAGEAL ECHOCARDIOGRAM (TEE);  Surgeon: Acie Fredrickson, Wonda Cheng, MD;  Location: [facility] ENDOSCOPY;   Service: Cardiovascular;  Laterality: N/A;   Social History   Socioeconomic History   Marital status: Married    Spouse name: Not on file   Number of children: Not on file   Years of education: Not on file   Highest education level: Not on file  Occupational History   Not on file  Tobacco Use   Smoking status: Every Day    Types: Cigarettes   Smokeless tobacco: Never   Tobacco comments:    8-9 cigarettes per day  Vaping Use   Vaping Use: Never used  Substance and Sexual Activity   Alcohol use: Shepherd   Drug use: Shepherd   Sexual activity: Not on file  Other Topics Concern   Not on file  Social History Narrative   Not on file   Social Determinants of Health   Financial Resource Strain: Not on file  Food Insecurity: Not on file  Transportation Needs: Not on file  Physical Activity: Not on file  Stress: Not on file  Social Connections: Not on file   Allergies  Allergen Reactions   Omeprazole     doesn't work well with plavix    Other Palpitations    Certain steroids give patient heart palpitations  Allergy to green peppers   Family History  Problem Relation Age of Onset   Heart disease Mother    Heart disease Father    Diabetes Sister    Prostate cancer Neg Hx    Bladder Cancer Neg Hx    Kidney cancer Neg Hx    Colon cancer Neg Hx    Esophageal cancer Neg Hx      Current Outpatient Medications (Cardiovascular):    amiodarone (PACERONE) 200 MG tablet, Take 0.5 tablets (100 mg total) by mouth daily.   atorvastatin (LIPITOR) 80 MG tablet, Take 80 mg by mouth daily.   carvedilol (COREG) 3.125 MG tablet, TAKE 1 TABLET BY MOUTH TWICE A DAY   isosorbide mononitrate (IMDUR) 60 MG 24 hr tablet, Take 60 mg by mouth daily.   losartan (COZAAR) 25 MG tablet, Take 1 tablet (25 mg total) by mouth daily.   nitroGLYCERIN (NITROSTAT) 0.4 MG SL tablet, Place 1 tablet (0.4 mg total) under the tongue as needed for chest pain. (Patient not taking: Reported on 08/06/2022)   torsemide  (DEMADEX) 20 MG tablet, Take 2 tablets (40 mg total) by mouth 2 (two) times daily.  Current Outpatient Medications (Respiratory):    BREO ELLIPTA 100-25 MCG/INH AEPB, Inhale 1 puff into the lungs daily as needed (Shortness of breath). As needed. (Patient not taking: Reported on 08/06/2022)  Current Outpatient Medications (Analgesics):    aspirin 81 MG chewable tablet, Chew 81 mg by mouth daily. (Patient not taking: Reported on 08/06/2022)  Current Outpatient Medications (Hematological):    ELIQUIS 5 MG TABS tablet, TAKE 1 TABLET BY MOUTH TWICE A DAY   ferrous sulfate 325 (65 FE) MG tablet, Take 325 mg by mouth every other day.   vitamin B-12 (CYANOCOBALAMIN) 1000 MCG tablet, Take 1,000 mcg by mouth daily.  Current Outpatient Medications (Other):    Cholecalciferol (VITAMIN D) 50 MCG (2000 UT) CAPS, Take 4,000 Units by mouth daily.   finasteride (PROSCAR) 5 MG  tablet, Take 1 tablet (5 mg total) by mouth daily.   gabapentin (NEURONTIN) 300 MG capsule, Take 300 mg by mouth in the morning, at noon, in the evening, and at bedtime.   lansoprazole (PREVACID) 30 MG capsule, Take 1 capsule (30 mg total) by mouth daily.   Melatonin 10 MG TABS, Take 10 mg by mouth at bedtime.   Multiple Vitamin (MULTIVITAMIN) tablet, Take 1 tablet by mouth daily.   potassium chloride (KLOR-CON) 10 MEQ tablet, TAKE 2 TABLETS BY MOUTH TWICE A DAY   traZODone (DESYREL) 50 MG tablet, Take 0.5 tablets (25 mg total) by mouth 2 (two) times daily. Take 25 tablets by mouth 2 (two) times daily. (Patient not taking: Reported on 08/06/2022)   traZODone (DESYREL) 50 MG tablet, Take 1 tablet by mouth at bedtime.   vitamin C (ASCORBIC ACID) 500 MG tablet, Take 500 mg by mouth daily.   Reviewed prior external information including notes and imaging from  primary care provider As well as notes that were available from care everywhere and other healthcare systems.  Past medical history, social, surgical and family history all reviewed in  electronic medical record.  Shepherd pertanent information unless stated regarding to the chief complaint.   Review of Systems:  Shepherd headache, visual changes, nausea, vomiting, diarrhea, constipation, dizziness, abdominal pain, skin rash, fevers, chills, night sweats, weight loss, swollen lymph nodes, body aches, joint swelling, chest pain, shortness of breath, mood changes. POSITIVE muscle aches  Objective  Blood pressure (!) 112/52, pulse (!) 59, height '6\' 1"'$  (1.854 m), SpO2 96 %.   General: Shepherd apparent distress alert and oriented x3 mood and affect normal, dressed appropriately.  HEENT: Pupils equal, extraocular movements intact  Respiratory: Patient's speak in full sentences and does not appear short of breath  Cardiovascular: Shepherd lower extremity edema, non tender, Shepherd erythema  Patient does have some peripheral up 3 disease still noted.  Seems to have radicular symptoms up to the right thigh as well.  Worsening though with straight leg test noted.  Some loss of lordosis.  Neck exam can only get to 0 degrees of extension at this moment.  Does have limited sidebending bilaterally.  Weakness noted on the left hand with flexion.  Shepherd masses palpated in the anterior chest.    Impression and Recommendations:     The above documentation has been reviewed and is accurate and complete Lyndal Pulley, DO

## 2022-08-24 ENCOUNTER — Ambulatory Visit: Payer: Self-pay

## 2022-08-24 ENCOUNTER — Ambulatory Visit: Payer: Medicare Other | Admitting: Family Medicine

## 2022-08-24 VITALS — BP 112/52 | HR 59 | Ht 73.0 in

## 2022-08-24 DIAGNOSIS — M25522 Pain in left elbow: Secondary | ICD-10-CM | POA: Diagnosis not present

## 2022-08-24 DIAGNOSIS — R222 Localized swelling, mass and lump, trunk: Secondary | ICD-10-CM | POA: Diagnosis not present

## 2022-08-24 DIAGNOSIS — G5602 Carpal tunnel syndrome, left upper limb: Secondary | ICD-10-CM

## 2022-08-24 DIAGNOSIS — R079 Chest pain, unspecified: Secondary | ICD-10-CM | POA: Diagnosis not present

## 2022-08-24 DIAGNOSIS — M542 Cervicalgia: Secondary | ICD-10-CM | POA: Diagnosis not present

## 2022-08-24 DIAGNOSIS — M48062 Spinal stenosis, lumbar region with neurogenic claudication: Secondary | ICD-10-CM

## 2022-08-24 DIAGNOSIS — I739 Peripheral vascular disease, unspecified: Secondary | ICD-10-CM

## 2022-08-24 DIAGNOSIS — M79604 Pain in right leg: Secondary | ICD-10-CM

## 2022-08-24 DIAGNOSIS — F17200 Nicotine dependence, unspecified, uncomplicated: Secondary | ICD-10-CM

## 2022-08-24 DIAGNOSIS — M5416 Radiculopathy, lumbar region: Secondary | ICD-10-CM

## 2022-08-24 NOTE — Assessment & Plan Note (Signed)
Did not have any significant improvement with the injection.  Concern for some more of a cervical radiculopathy.  We will get MRI of the cervical spine to see if he could be a candidate for epidural in the cervical area.

## 2022-08-24 NOTE — Assessment & Plan Note (Signed)
May need to continue to follow-up with vascular.

## 2022-08-24 NOTE — Patient Instructions (Addendum)
MRI cervical and CT chest [phone] See me again in 6-8 weeks

## 2022-08-24 NOTE — Assessment & Plan Note (Signed)
Patient is a smoker and on lung CT scanning patient did have questionable new nodules noted.  Recommendation was follow-up in 6 months.  This will be due in October.  Due to patient having more radicular discomfort as well as pectoral pain I do feel that we could consider repeating the imaging now.  We will order it at this time to for further evaluation.

## 2022-08-24 NOTE — Assessment & Plan Note (Signed)
Patient is having signs and symptoms of either L5 nerve root or unfortunately but this could be the peripheral arterial disease that could be contributing.  Would like to try a nerve root injection to see how patient responds this will be diagnostic as well as therapeutic.  Depending on this could be a candidate for possible radiofrequency ablation but does have moderate to severe spinal stenosis as well.  Patient is back does have arthritic changes noted and we will continue to monitor.  Follow-up again in 6 to 8 weeks

## 2022-09-01 DIAGNOSIS — D485 Neoplasm of uncertain behavior of skin: Secondary | ICD-10-CM | POA: Diagnosis not present

## 2022-09-01 DIAGNOSIS — C44329 Squamous cell carcinoma of skin of other parts of face: Secondary | ICD-10-CM | POA: Diagnosis not present

## 2022-09-03 DIAGNOSIS — R7303 Prediabetes: Secondary | ICD-10-CM | POA: Diagnosis not present

## 2022-09-03 DIAGNOSIS — I25119 Atherosclerotic heart disease of native coronary artery with unspecified angina pectoris: Secondary | ICD-10-CM | POA: Diagnosis not present

## 2022-09-03 DIAGNOSIS — R053 Chronic cough: Secondary | ICD-10-CM | POA: Diagnosis not present

## 2022-09-03 DIAGNOSIS — I480 Paroxysmal atrial fibrillation: Secondary | ICD-10-CM | POA: Diagnosis not present

## 2022-09-10 DIAGNOSIS — I10 Essential (primary) hypertension: Secondary | ICD-10-CM | POA: Diagnosis not present

## 2022-09-10 DIAGNOSIS — I739 Peripheral vascular disease, unspecified: Secondary | ICD-10-CM | POA: Diagnosis not present

## 2022-09-10 DIAGNOSIS — I25119 Atherosclerotic heart disease of native coronary artery with unspecified angina pectoris: Secondary | ICD-10-CM | POA: Diagnosis not present

## 2022-09-10 DIAGNOSIS — Z23 Encounter for immunization: Secondary | ICD-10-CM | POA: Diagnosis not present

## 2022-09-13 ENCOUNTER — Ambulatory Visit
Admission: RE | Admit: 2022-09-13 | Discharge: 2022-09-13 | Disposition: A | Discharge status: Home / Self Care | Payer: Medicare Other | Source: Ambulatory Visit | Attending: Family Medicine | Admitting: Family Medicine

## 2022-09-13 DIAGNOSIS — M542 Cervicalgia: Secondary | ICD-10-CM

## 2022-09-13 DIAGNOSIS — M4802 Spinal stenosis, cervical region: Secondary | ICD-10-CM | POA: Diagnosis not present

## 2022-09-14 ENCOUNTER — Encounter: Payer: Self-pay | Admitting: Family Medicine

## 2022-09-14 ENCOUNTER — Other Ambulatory Visit: Payer: Self-pay

## 2022-09-14 DIAGNOSIS — M5412 Radiculopathy, cervical region: Secondary | ICD-10-CM

## 2022-09-15 ENCOUNTER — Encounter: Payer: Self-pay | Admitting: Cardiovascular Disease

## 2022-09-15 DIAGNOSIS — D0439 Carcinoma in situ of skin of other parts of face: Secondary | ICD-10-CM | POA: Diagnosis not present

## 2022-09-15 DIAGNOSIS — C44329 Squamous cell carcinoma of skin of other parts of face: Secondary | ICD-10-CM | POA: Diagnosis not present

## 2022-09-20 ENCOUNTER — Ambulatory Visit
Admission: RE | Admit: 2022-09-20 | Discharge: 2022-09-20 | Disposition: A | Discharge status: Home / Self Care | Payer: Medicare Other | Source: Ambulatory Visit | Attending: Family Medicine | Admitting: Family Medicine

## 2022-09-20 DIAGNOSIS — J439 Emphysema, unspecified: Secondary | ICD-10-CM | POA: Diagnosis not present

## 2022-09-20 DIAGNOSIS — R918 Other nonspecific abnormal finding of lung field: Secondary | ICD-10-CM | POA: Diagnosis not present

## 2022-09-20 DIAGNOSIS — R911 Solitary pulmonary nodule: Secondary | ICD-10-CM | POA: Diagnosis not present

## 2022-09-20 DIAGNOSIS — I3139 Other pericardial effusion (noninflammatory): Secondary | ICD-10-CM | POA: Diagnosis not present

## 2022-09-20 DIAGNOSIS — R222 Localized swelling, mass and lump, trunk: Secondary | ICD-10-CM

## 2022-09-23 ENCOUNTER — Encounter: Payer: Self-pay | Admitting: Family Medicine

## 2022-09-27 ENCOUNTER — Other Ambulatory Visit: Payer: Self-pay

## 2022-09-27 DIAGNOSIS — M5416 Radiculopathy, lumbar region: Secondary | ICD-10-CM

## 2022-09-30 ENCOUNTER — Other Ambulatory Visit (HOSPITAL_COMMUNITY): Payer: Self-pay

## 2022-09-30 ENCOUNTER — Other Ambulatory Visit: Payer: Self-pay | Admitting: Nurse Practitioner

## 2022-09-30 DIAGNOSIS — H25813 Combined forms of age-related cataract, bilateral: Secondary | ICD-10-CM | POA: Diagnosis not present

## 2022-09-30 DIAGNOSIS — H02831 Dermatochalasis of right upper eyelid: Secondary | ICD-10-CM | POA: Diagnosis not present

## 2022-09-30 DIAGNOSIS — H35031 Hypertensive retinopathy, right eye: Secondary | ICD-10-CM | POA: Diagnosis not present

## 2022-09-30 DIAGNOSIS — H04123 Dry eye syndrome of bilateral lacrimal glands: Secondary | ICD-10-CM | POA: Diagnosis not present

## 2022-10-01 NOTE — Telephone Encounter (Signed)
Pt was contacted in regard to the refill request and stated that he has been taking the Lansoprazole without any adverse reactions and it is helping his stomach a lot: Refill request approved: Pt made aware: Pt verbalized understanding with all questions answered.

## 2022-10-05 ENCOUNTER — Other Ambulatory Visit: Payer: Medicare Other

## 2022-10-06 ENCOUNTER — Encounter: Payer: Self-pay | Admitting: Cardiovascular Disease

## 2022-10-06 ENCOUNTER — Ambulatory Visit: Payer: Medicare Other | Admitting: Cardiovascular Disease

## 2022-10-06 ENCOUNTER — Ambulatory Visit: Payer: Medicare Other | Attending: Cardiovascular Disease | Admitting: Cardiovascular Disease

## 2022-10-06 DIAGNOSIS — R072 Precordial pain: Secondary | ICD-10-CM | POA: Diagnosis not present

## 2022-10-06 DIAGNOSIS — E782 Mixed hyperlipidemia: Secondary | ICD-10-CM | POA: Diagnosis not present

## 2022-10-06 DIAGNOSIS — I739 Peripheral vascular disease, unspecified: Secondary | ICD-10-CM

## 2022-10-06 DIAGNOSIS — E785 Hyperlipidemia, unspecified: Secondary | ICD-10-CM

## 2022-10-06 DIAGNOSIS — Z951 Presence of aortocoronary bypass graft: Secondary | ICD-10-CM | POA: Diagnosis not present

## 2022-10-06 DIAGNOSIS — I4819 Other persistent atrial fibrillation: Secondary | ICD-10-CM

## 2022-10-06 MED ORDER — ISOSORBIDE MONONITRATE ER 60 MG PO TB24: 90.0000 mg | ORAL_TABLET | Freq: Every day | ORAL | 3 refills | Status: DC

## 2022-10-06 MED ORDER — NITROGLYCERIN 0.4 MG SL SUBL: 0.4000 mg | SUBLINGUAL_TABLET | SUBLINGUAL | 3 refills | Status: DC | PRN

## 2022-10-06 NOTE — Assessment & Plan Note (Signed)
History of CAD status post CABG x3 by Dr. Georga Kaufmann at [place] with redo bypass in 1998 at [facility].  He is on long-acting oral nitrates.  When I saw him 6 months ago he was pain-free.  Over the last 2 months he has had nocturnal angina occasionally waking him from sleep relieved with sublingual nitroglycerin.  I am going to increase his Imdur from 60 to 90 mg a day and obtain a Lexiscan Myoview stress test to further evaluate.

## 2022-10-06 NOTE — Progress Notes (Signed)
10/06/2022 [name]   1946/06/20  [id number]  Primary Physician Kirk Ruths, MD Primary Cardiologist: Lorretta Harp MD FACP, [place], [place], [place]  HPI:  Gregory Shepherd is a 76 y.o.   mild to moderately overweight married Caucasian male father of 2, grandfather of 3 grandchildren who I last saw in the office 03/23/2022.  He is accompanied by his wife [name] today.  He was referred by Dr. Gardenia Phlegm for peripheral vascular valuation because of claudication.  He is retired from being in [employer]  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  He has treated hypertension hyperlipidemia.  His father did have a myocardial infarction.  He had CABG x2 at [facility] in [place] by Dr. Eliberto Ivory.  He did have a remote MI at age 51 as well.  He had redo bypass surgery x3 at [place] 8 years later.    His major issue is with left lower extremity numbness and claudication.  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  He has high-frequency signals in both iliac arteries and monophasic waveforms below that on the right.   We proceeded with lower extremity angiography and intervention in 06/04/2019 revealing a 99% calcified ostial/proximal right common iliac artery stenosis and a 60 to 70% left common iliac artery stenosis.  Performed diamondback orbital rotational atherectomy, PTA and stenting with an excellent result.  His Dopplers normalized and his claudication resolved.   He did undergo DC cardioversion at the end of December 2022 after being put on amiodarone.  He has maintained sinus rhythm/sinus bradycardia on amiodarone, low-dose carvedilol and Eliquis.  He was seen in the office by Dr. Gardiner Rhyme 08/06/2022 with hypertension but apparently this was an artifact of his wrist blood pressure monitor since his blood  pressures were normal with a cuff blood pressure monitor.  He was placed on losartan which he stopped because of hypotension.  Since I saw him 6 months ago he has started to complain of nocturnal angina in the late evening hours which has occasionally awaken him from sleep.  This is relieved with 1 sublingual nitroglycerin.  He does not get chest pain while walking or during exertion.  Otherwise he does not have symptoms of heart failure and has maintained sinus rhythm.  He complains of some numbness in his right leg which sounds neurogenic but denies claudication or symptoms similar to he had prior to intervention 3 years ago.  His Dopplers done February of this year showed continued patency of his right iliac stent with normal ABIs.  Current Meds  Medication Sig   amiodarone (PACERONE) 200 MG tablet Take 0.5 tablets (100 mg total) by mouth daily.   atorvastatin (LIPITOR) 80 MG tablet Take 80 mg by mouth daily.   carvedilol (COREG) 3.125 MG tablet TAKE 1 TABLET BY MOUTH TWICE A DAY   Cholecalciferol (VITAMIN D) 50 MCG (2000 UT) CAPS Take 4,000 Units by mouth daily.   ELIQUIS 5 MG TABS tablet TAKE 1 TABLET BY MOUTH TWICE A DAY   finasteride (PROSCAR) 5 MG tablet Take 1 tablet (5 mg total) by mouth daily.   gabapentin (NEURONTIN) 300 MG capsule Take 300 mg by mouth in the morning, at noon, in the evening, and at bedtime.   lansoprazole (PREVACID) 30 MG capsule TAKE 1 CAPSULE BY  MOUTH DAILY   Multiple Vitamin (MULTIVITAMIN) tablet Take 1 tablet by mouth daily.   potassium chloride (KLOR-CON) 10 MEQ tablet TAKE 2 TABLETS BY MOUTH TWICE A DAY   torsemide (DEMADEX) 20 MG tablet Take 2 tablets (40 mg total) by mouth 2 (two) times daily.   traZODone (DESYREL) 50 MG tablet Take 1 tablet by mouth at bedtime.   vitamin B-12 (CYANOCOBALAMIN) 1000 MCG tablet Take 1,000 mcg by mouth daily.   vitamin C (ASCORBIC ACID) 500 MG tablet Take 500 mg by mouth daily.   [DISCONTINUED] isosorbide mononitrate (IMDUR) 60  MG 24 hr tablet Take 60 mg by mouth daily.   [DISCONTINUED] nitroGLYCERIN (NITROSTAT) 0.4 MG SL tablet Place 1 tablet (0.4 mg total) under the tongue as needed for chest pain.     Allergies  Allergen Reactions   Omeprazole     doesn't work well with plavix    Other Palpitations    Certain steroids give patient heart palpitations  Allergy to green peppers    Social History   Socioeconomic History   Marital status: Married    Spouse name: Not on file   Number of children: Not on file   Years of education: Not on file   Highest education level: Not on file  Occupational History   Not on file  Tobacco Use   Smoking status: Every Day    Types: Cigarettes   Smokeless tobacco: Never   Tobacco comments:    8-9 cigarettes per day  Vaping Use   Vaping Use: Never used  Substance and Sexual Activity   Alcohol use: No   Drug use: No   Sexual activity: Not on file  Other Topics Concern   Not on file  Social History Narrative   Not on file   Social Determinants of Health   Financial Resource Strain: Not on file  Food Insecurity: Not on file  Transportation Needs: Not on file  Physical Activity: Not on file  Stress: Not on file  Social Connections: Not on file  Intimate Partner Violence: Not on file     Review of Systems: General: negative for chills, fever, night sweats or weight changes.  Cardiovascular: negative for chest pain, dyspnea on exertion, edema, orthopnea, palpitations, paroxysmal nocturnal dyspnea or shortness of breath Dermatological: negative for rash Respiratory: negative for cough or wheezing Urologic: negative for hematuria Abdominal: negative for nausea, vomiting, diarrhea, bright red blood per rectum, melena, or hematemesis Neurologic: negative for visual changes, syncope, or dizziness All other systems reviewed and are otherwise negative except as noted above.    Blood pressure (!) 120/50, pulse (!) 52, height '6\' 1"'$  (1.854 m), weight 189 lb (85.7  kg), SpO2 96 %.  General appearance: alert and no distress Neck: no adenopathy, no carotid bruit, no JVD, supple, symmetrical, trachea midline, and thyroid not enlarged, symmetric, no tenderness/mass/nodules Lungs: clear to auscultation bilaterally Heart: regular rate and rhythm, S1, S2 normal, no murmur, click, rub or gallop Extremities: extremities normal, atraumatic, no cyanosis or edema Pulses: 2+ and symmetric Skin: Skin color, texture, turgor normal. No rashes or lesions Neurologic: Grossly normal  EKG not performed today  ASSESSMENT AND PLAN:   Hyperlipemia History of hyperlipidemia on high-dose statin therapy followed by his PCP.  Hx of CABG History of CAD status post CABG x3 by Dr. Georga Kaufmann at [place] with redo bypass in 1998 at [facility].  He is on long-acting oral nitrates.  When I saw him 6  months ago he was pain-free.  Over the last 2 months he has had nocturnal angina occasionally waking him from sleep relieved with sublingual nitroglycerin.  I am going to increase his Imdur from 60 to 90 mg a day and obtain a Lexiscan Myoview stress test to further evaluate.  Essential (primary) hypertension History of essential hypertension blood pressure measured today at 120/50.  He is on carvedilol.  He apparently saw Dr. Gardiner Rhyme on 08/06/2022 with hypertension.  He was taking his blood pressure with a wrist blood pressure monitor at.  This was changed to a Omron arm cuff which revealed normal blood pressures.  He was started on losartan which resulted in hypotension and this was discontinued by the patient.  Peripheral arterial disease (HCC) History of PAD status post right common iliac artery orbital atherectomy, PTA and stenting by myself 06/04/2019.  He did have a 60 to 70% calcified ostial left common iliac artery stenosis but denied claudication on that side.  His most recent Doppler studies performed 01/14/2022 revealed a patent right  common iliac artery stent with normal ABIs bilaterally.  We will continue to follow him on an annual basis.  Dyslipidemia, goal LDL below 70 History of dyslipidemia on high-dose atorvastatin with lipid profile followed by his PCP  Persistent atrial fibrillation (HCC) History of persistent atrial fibrillation maintaining sinus rhythm on Eliquis and amiodarone.  He has been followed by Dr. Quentin Ore in the past.     Lorretta Harp MD [facility], [facility] 10/06/2022 12:13 PM

## 2022-10-06 NOTE — Assessment & Plan Note (Signed)
History of essential hypertension blood pressure measured today at 120/50.  He is on carvedilol.  He apparently saw Dr. Gardiner Rhyme on 08/06/2022 with hypertension.  He was taking his blood pressure with a wrist blood pressure monitor at.  This was changed to a Omron arm cuff which revealed normal blood pressures.  He was started on losartan which resulted in hypotension and this was discontinued by the patient.

## 2022-10-06 NOTE — Assessment & Plan Note (Signed)
History of hyperlipidemia on high-dose statin therapy followed by his PCP.

## 2022-10-06 NOTE — Assessment & Plan Note (Signed)
History of dyslipidemia on high-dose atorvastatin with lipid profile followed by his PCP

## 2022-10-06 NOTE — Assessment & Plan Note (Signed)
History of PAD status post right common iliac artery orbital atherectomy, PTA and stenting by myself 06/04/2019.  He did have a 60 to 70% calcified ostial left common iliac artery stenosis but denied claudication on that side.  His most recent Doppler studies performed 01/14/2022 revealed a patent right common iliac artery stent with normal ABIs bilaterally.  We will continue to follow him on an annual basis.

## 2022-10-06 NOTE — Progress Notes (Unsigned)
[facility] [place] Phone: [phone] Subjective:   [name], am serving as a scribe for Dr. Hulan Saas.  I'm seeing this patient by the request  of:  Kirk Ruths, MD  CC: Hip pain and other pain and numbness follow-up  KNL:[id number]  08/24/2022 Patient is a smoker and on lung CT scanning patient did have questionable new nodules noted.  Recommendation was follow-up in 6 months.  This will be due in October.  Due to patient having more radicular discomfort as well as pectoral pain I do feel that we could consider repeating the imaging now.  We will order it at this time to for further evaluation.  Did not have any significant improvement with the injection.  Concern for some more of a cervical radiculopathy.  We will get MRI of the cervical spine to see if he could be a candidate for epidural in the cervical area  May need to continue to follow-up with vascular.  Patient is having signs and symptoms of either L5 nerve root or unfortunately but this could be the peripheral arterial disease that could be contributing.  Would like to try a nerve root injection to see how patient responds this will be diagnostic as well as therapeutic.  Depending on this could be a candidate for possible radiofrequency ablation but does have moderate to severe spinal stenosis as well.  Patient is back does have arthritic changes noted and we will continue to monitor.  Follow-up again in 6 to 8 weeks   Update 10/11/2022 Jahlani Lorentz Alper is a 76 y.o. male coming in with complaint of cervical, lumbar and L elbow pain. Epidural 10/19/2022 for his back pain.. Patient states that R leg pain is constant and intensifying.   Patient also c/o pain in L hand. Unable to feel sensation in the hand. Dropping items at home.   Due to the presence of a lung nodule we did get a new CT scan of the chest that showed there was a benign appearance and  we should consider having a repeat again in October 2023.    Past Medical History:  Diagnosis Date   Carotid artery narrowing    History of blood clots    History of colon polyps    Hyperlipidemia    Hypertension    MI (myocardial infarction) [facility])    Past Surgical History:  Procedure Laterality Date   ABDOMINAL AORTOGRAM W/LOWER EXTREMITY N/A 06/04/2019   Procedure: ABDOMINAL AORTOGRAM W/LOWER EXTREMITY;  Surgeon: Lorretta Harp, MD;  Location: [place] [facility];  Service: Cardiovascular;  Laterality: N/A;   BIOPSY  02/08/2022   Procedure: BIOPSY;  Surgeon: Thornton Park, MD;  Location: [facility];  Service: Gastroenterology;;   CARDIAC SURGERY     x2    CARDIOVERSION N/A 08/25/2021   Procedure: CARDIOVERSION;  Surgeon: Skeet Latch, MD;  Location: [place];  Service: Cardiovascular;  Laterality: N/A;   CARDIOVERSION N/A 11/23/2021   Procedure: CARDIOVERSION;  Surgeon: Thayer Headings, MD;  Location: [facility] ENDOSCOPY;  Service: Cardiovascular;  Laterality: N/A;   COLONOSCOPY     Age 76. [facility]   COLONOSCOPY WITH PROPOFOL N/A 02/08/2022   Procedure: COLONOSCOPY WITH PROPOFOL;  Surgeon: Thornton Park, MD;  Location: [facility];  Service: Gastroenterology;  Laterality: N/A;   CORONARY ARTERY BYPASS GRAFT  1990   x 2 at [facility], redo x 3 @ [place] approx 1998   ESOPHAGOGASTRODUODENOSCOPY (EGD) WITH  PROPOFOL N/A 02/08/2022   Procedure: ESOPHAGOGASTRODUODENOSCOPY (EGD) WITH PROPOFOL;  Surgeon: Thornton Park, MD;  Location: [facility];  Service: Gastroenterology;  Laterality: N/A;   PERIPHERAL VASCULAR ATHERECTOMY Right 06/04/2019   Procedure: PERIPHERAL VASCULAR ATHERECTOMY;  Surgeon: Lorretta Harp, MD;  Location: [place] [facility];  Service: Cardiovascular;  Laterality: Right;  common iliac   PERIPHERAL VASCULAR INTERVENTION Right 06/04/2019   Procedure: PERIPHERAL VASCULAR INTERVENTION;  Surgeon: Lorretta Harp, MD;  Location: [place]  [facility];  Service: Cardiovascular;  Laterality: Right;  common iliac   POLYPECTOMY  02/08/2022   Procedure: POLYPECTOMY;  Surgeon: Thornton Park, MD;  Location: [facility];  Service: Gastroenterology;;   TEE WITHOUT CARDIOVERSION N/A 08/25/2021   Procedure: TRANSESOPHAGEAL ECHOCARDIOGRAM (TEE);  Surgeon: Skeet Latch, MD;  Location: [place];  Service: Cardiovascular;  Laterality: N/A;   TEE WITHOUT CARDIOVERSION N/A 11/23/2021   Procedure: TRANSESOPHAGEAL ECHOCARDIOGRAM (TEE);  Surgeon: Acie Fredrickson, Wonda Cheng, MD;  Location: [facility] ENDOSCOPY;  Service: Cardiovascular;  Laterality: N/A;   Social History   Socioeconomic History   Marital status: Married    Spouse name: Not on file   Number of children: Not on file   Years of education: Not on file   Highest education level: Not on file  Occupational History   Not on file  Tobacco Use   Smoking status: Every Day    Types: Cigarettes   Smokeless tobacco: Never   Tobacco comments:    8-9 cigarettes per day  Vaping Use   Vaping Use: Never used  Substance and Sexual Activity   Alcohol use: No   Drug use: No   Sexual activity: Not on file  Other Topics Concern   Not on file  Social History Narrative   Not on file   Social Determinants of Health   Financial Resource Strain: Not on file  Food Insecurity: Not on file  Transportation Needs: Not on file  Physical Activity: Not on file  Stress: Not on file  Social Connections: Not on file   Allergies  Allergen Reactions   Omeprazole     doesn't work well with plavix    Other Palpitations    Certain steroids give patient heart palpitations  Allergy to green peppers   Family History  Problem Relation Age of Onset   Heart disease Mother    Heart disease Father    Diabetes Sister    Prostate cancer Neg Hx    Bladder Cancer Neg Hx    Kidney cancer Neg Hx    Colon cancer Neg Hx    Esophageal cancer Neg Hx     Current Outpatient Medications (Endocrine & Metabolic):     levothyroxine (SYNTHROID) 50 MCG tablet, Take 50 mcg by mouth daily before breakfast.  Current Outpatient Medications (Cardiovascular):    amiodarone (PACERONE) 200 MG tablet, Take 0.5 tablets (100 mg total) by mouth daily.   atorvastatin (LIPITOR) 80 MG tablet, Take 80 mg by mouth daily.   carvedilol (COREG) 3.125 MG tablet, TAKE 1 TABLET BY MOUTH TWICE A DAY   isosorbide mononitrate (IMDUR) 60 MG 24 hr tablet, Take 1.5 tablets (90 mg total) by mouth daily.   nitroGLYCERIN (NITROSTAT) 0.4 MG SL tablet, Place 1 tablet (0.4 mg total) under the tongue as needed for chest pain.   torsemide (DEMADEX) 20 MG tablet, Take 2 tablets (40 mg total) by mouth 2 (two) times daily.    Current Outpatient Medications (Hematological):    ELIQUIS 5 MG TABS tablet, TAKE 1 TABLET  BY MOUTH TWICE A DAY   vitamin B-12 (CYANOCOBALAMIN) 1000 MCG tablet, Take 1,000 mcg by mouth daily.  Current Outpatient Medications (Other):    Cholecalciferol (VITAMIN D) 50 MCG (2000 UT) CAPS, Take 4,000 Units by mouth daily.   finasteride (PROSCAR) 5 MG tablet, Take 1 tablet (5 mg total) by mouth daily.   gabapentin (NEURONTIN) 300 MG capsule, Take 300 mg by mouth in the morning, at noon, in the evening, and at bedtime.   lansoprazole (PREVACID) 30 MG capsule, TAKE 1 CAPSULE BY MOUTH DAILY   Multiple Vitamin (MULTIVITAMIN) tablet, Take 1 tablet by mouth daily.   potassium chloride (KLOR-CON) 10 MEQ tablet, TAKE 2 TABLETS BY MOUTH TWICE A DAY   traZODone (DESYREL) 50 MG tablet, Take 1 tablet by mouth at bedtime.   vitamin C (ASCORBIC ACID) 500 MG tablet, Take 500 mg by mouth daily.   Reviewed prior external information including notes and imaging from  primary care provider As well as notes that were available from care everywhere and other healthcare systems.  Past medical history, social, surgical and family history all reviewed in electronic medical record.  No pertanent information unless stated regarding to the chief  complaint.   Review of Systems:  No headache, visual changes, nausea, vomiting, diarrhea, constipation, dizziness, abdominal pain, skin rash, fevers, chills, night sweats, weight loss, swollen lymph nodes,  joint swelling, chest pain, shortness of breath, mood changes. POSITIVE muscle aches, body aches, numbness  Objective  Blood pressure 118/60, pulse 62, height '6\' 1"'$  (1.854 m), weight 188 lb (85.3 kg), SpO2 95 %.   General: No apparent distress alert and oriented x3 mood and affect normal, dressed appropriately.  HEENT: Pupils equal, extraocular movements intact  Respiratory: Patient's speak in full sentences and does not appear short of breath  Cardiovascular: No lower extremity edema, non tender, no erythema  Patient does have a mild shuffling gait noted.  Improvement hip.  Patient noted does have significant weakness of the left hand especially in the C7 and C8 distribution.  Negative Tinel's.  Neck exam does have significant loss of lordosis with only 5 degrees of extension.    Impression and Recommendations:     The above documentation has been reviewed and is accurate and complete Lyndal Pulley, DO

## 2022-10-06 NOTE — Assessment & Plan Note (Signed)
History of persistent atrial fibrillation maintaining sinus rhythm on Eliquis and amiodarone.  He has been followed by Dr. Quentin Ore in the past.

## 2022-10-06 NOTE — Patient Instructions (Signed)
Medication Instructions:  Your physician has recommended you make the following change in your medication:   -Increase isosorbide mononitrate (Imdur) to '90mg'$  once daily.    *If you need a refill on your cardiac medications before your next appointment, please call your pharmacy*   Testing/Procedures: Your physician has requested that you have a lexiscan myoview. For further information please visit [email or web address]. Please follow instruction sheet, as given. This will take place at [address], [address]  How to prepare for your Myocardial Perfusion Test: Do not eat or drink 3 hours prior to your test, except you may have water. Do not consume products containing caffeine (regular or decaffeinated) 12 hours prior to your test. (ex: coffee, chocolate, sodas, tea). Do bring a list of your current medications with you.  If not listed below, you may take your medications as normal. Do wear comfortable clothes (no dresses or overalls) and walking shoes, tennis shoes preferred (No heels or open toe shoes are allowed). Do NOT wear cologne, perfume, aftershave, or lotions (deodorant is allowed). The test will take approximately 3 to 4 hours to complete If these instructions are not followed, your test will have to be rescheduled.     Follow-Up: At [facility], you and your health needs are our priority.  As part of our continuing mission to provide you with exceptional heart care, we have created designated Provider Care Teams.  These Care Teams include your primary Cardiologist (physician) and Advanced Practice Providers (APPs -  Physician Assistants and Nurse Practitioners) who all work together to provide you with the care you need, when you need it.  We recommend signing up for the patient portal called "MyChart".  Sign up information is provided on this After Visit Summary.  MyChart is used to connect with patients for Virtual Visits (Telemedicine).  Patients are able to  view lab/test results, encounter notes, upcoming appointments, etc.  Non-urgent messages can be sent to your provider as well.   To learn more about what you can do with MyChart, go to [email or web address].    Your next appointment:   2-3 week(s)  The format for your next appointment:   In Person  Provider:   Quay Burow, MD

## 2022-10-07 NOTE — Telephone Encounter (Signed)
Spoke with patient. Gave instructions for MPI on 10/08/2022.

## 2022-10-08 ENCOUNTER — Encounter: Payer: Self-pay | Admitting: Cardiovascular Disease

## 2022-10-08 ENCOUNTER — Ambulatory Visit: Payer: Medicare Other | Attending: Cardiovascular Disease

## 2022-10-08 ENCOUNTER — Ambulatory Visit: Payer: Medicare Other | Admitting: Cardiovascular Disease

## 2022-10-08 VITALS — BP 148/66 | HR 98 | Ht 73.5 in | Wt 188.0 lb

## 2022-10-08 DIAGNOSIS — I25708 Atherosclerosis of coronary artery bypass graft(s), unspecified, with other forms of angina pectoris: Secondary | ICD-10-CM | POA: Insufficient documentation

## 2022-10-08 DIAGNOSIS — Z951 Presence of aortocoronary bypass graft: Secondary | ICD-10-CM | POA: Diagnosis not present

## 2022-10-08 DIAGNOSIS — R072 Precordial pain: Secondary | ICD-10-CM

## 2022-10-08 DIAGNOSIS — E782 Mixed hyperlipidemia: Secondary | ICD-10-CM

## 2022-10-08 LAB — MYOCARDIAL PERFUSION IMAGING
LV dias vol: 154 mL (ref 62–150)
LV sys vol: 90 mL
Nuc Stress EF: 42 %
Peak HR: 62 {beats}/min
Rest HR: 48 {beats}/min
Rest Nuclear Isotope Dose: 10.3 mCi
SDS: 3
SRS: 0
SSS: 3
ST Depression (mm): 0 mm
Stress Nuclear Isotope Dose: 32.7 mCi
TID: 1.02

## 2022-10-08 MED ORDER — TECHNETIUM TC 99M TETROFOSMIN IV KIT
32.7000 | PACK | Freq: Once | INTRAVENOUS | Status: AC | PRN
  Administered 2022-10-08: 32.7 via INTRAVENOUS

## 2022-10-08 MED ORDER — REGADENOSON 0.4 MG/5ML IV SOLN
0.4000 mg | Freq: Once | INTRAVENOUS | Status: AC
  Administered 2022-10-08: 0.4 mg via INTRAVENOUS

## 2022-10-08 MED ORDER — TECHNETIUM TC 99M TETROFOSMIN IV KIT
10.3000 | PACK | Freq: Once | INTRAVENOUS | Status: AC | PRN
  Administered 2022-10-08: 10.3 via INTRAVENOUS

## 2022-10-08 NOTE — Patient Instructions (Signed)
Medication Instructions:  Your physician recommends that you continue on your current medications as directed. Please refer to the Current Medication list given to you today.  *If you need a refill on your cardiac medications before your next appointment, please call your pharmacy*   Follow-Up: At [facility], you and your health needs are our priority.  As part of our continuing mission to provide you with exceptional heart care, we have created designated Provider Care Teams.  These Care Teams include your primary Cardiologist (physician) and Advanced Practice Providers (APPs -  Physician Assistants and Nurse Practitioners) who all work together to provide you with the care you need, when you need it.  We recommend signing up for the patient portal called "MyChart".  Sign up information is provided on this After Visit Summary.  MyChart is used to connect with patients for Virtual Visits (Telemedicine).  Patients are able to view lab/test results, encounter notes, upcoming appointments, etc.  Non-urgent messages can be sent to your provider as well.   To learn more about what you can do with MyChart, go to [email or web address].    Your next appointment:   3 month(s)  The format for your next appointment:   In Person  Provider:   Quay Burow, MD

## 2022-10-08 NOTE — Progress Notes (Signed)
Gregory Shepherd returns today for follow-up of his Myoview stress test performed this morning because of nocturnal chest pain which is nitrate responsive.  He is 25 years post redo bypass grafting at [facility]  I increase his Imdur from 60 to 90 mg.  He said no recurrent chest pain.  Myoview stress test this morning showed no ischemia and was unchanged from his Myoview performed 1 year ago.  At this point, I feel comfortable continuing with medical therapy and seeing him back in 3 months.  Should he have recurrent or accelerated chest pain we discussed proceeding with diagnostic coronary angiography.  Lorretta Harp, M.D., [place], [facility], [place] [address] [place], [place]  [id number]  [phone] 10/08/2022 2:33 PM

## 2022-10-11 ENCOUNTER — Encounter: Payer: Self-pay | Admitting: Family Medicine

## 2022-10-11 ENCOUNTER — Ambulatory Visit: Payer: Medicare Other | Admitting: Family Medicine

## 2022-10-11 VITALS — BP 118/60 | HR 62 | Ht 73.0 in | Wt 188.0 lb

## 2022-10-11 DIAGNOSIS — M5030 Other cervical disc degeneration, unspecified cervical region: Secondary | ICD-10-CM | POA: Diagnosis not present

## 2022-10-11 DIAGNOSIS — I739 Peripheral vascular disease, unspecified: Secondary | ICD-10-CM

## 2022-10-11 DIAGNOSIS — E538 Deficiency of other specified B group vitamins: Secondary | ICD-10-CM | POA: Diagnosis not present

## 2022-10-11 DIAGNOSIS — G5622 Lesion of ulnar nerve, left upper limb: Secondary | ICD-10-CM | POA: Diagnosis not present

## 2022-10-11 MED ORDER — CYANOCOBALAMIN 1000 MCG/ML IJ SOLN
1000.0000 ug | Freq: Once | INTRAMUSCULAR | Status: AC
  Administered 2022-10-11: 1000 ug via INTRAMUSCULAR

## 2022-10-11 NOTE — Patient Instructions (Addendum)
Get labs on the way out Will give a b12 injection once labs are drawn Let us know how you are doing after the epidural Wait two weeks after the back epidural before getting the neck injection  Follow up in 6-8 weeks

## 2022-10-12 DIAGNOSIS — M5030 Other cervical disc degeneration, unspecified cervical region: Secondary | ICD-10-CM | POA: Insufficient documentation

## 2022-10-12 LAB — B12 AND FOLATE PANEL
Folate: >23.8 ng/mL (ref >5.9)
Vitamin B-12: >1500 pg/mL — ABNORMAL HIGH (ref 211–911)

## 2022-10-12 NOTE — Assessment & Plan Note (Signed)
Attempted injection that did not seem to make significant improvement.  I do feel that the epidural in the neck is the next best step.  Otherwise would need to consider decompression surgery.

## 2022-10-12 NOTE — Assessment & Plan Note (Signed)
Degenerative disc disease of the cervical spine.  Patient was found to have nerve entrapment.  I do think that an epidural would be helpful and patient is scheduled in the near future.  Warned of potential side effects, patient hopefully will do well and follow-up with me again in 6 to 8 weeks.  Hopefully will see some improvement in some of the weakness were noted on the left side as well.  Patient knows of worsening headache, increasing weakness of the upper extremity to seek medical attention immediately.  Total time with patient and significant other 31 minutes

## 2022-10-12 NOTE — Assessment & Plan Note (Signed)
Seen cardiology.  He feels that it is stable and will be following up with them.

## 2022-10-19 ENCOUNTER — Other Ambulatory Visit: Payer: Medicare Other

## 2022-10-25 ENCOUNTER — Other Ambulatory Visit (HOSPITAL_COMMUNITY): Payer: Self-pay | Admitting: *Deleted

## 2022-10-25 ENCOUNTER — Other Ambulatory Visit: Payer: Self-pay | Admitting: Family Medicine

## 2022-10-25 ENCOUNTER — Ambulatory Visit
Admission: RE | Admit: 2022-10-25 | Discharge: 2022-10-25 | Disposition: A | Discharge status: Home / Self Care | Payer: Medicare Other | Source: Ambulatory Visit | Attending: Family Medicine | Admitting: Family Medicine

## 2022-10-25 ENCOUNTER — Telehealth (HOSPITAL_COMMUNITY): Payer: Self-pay | Admitting: *Deleted

## 2022-10-25 DIAGNOSIS — Z01812 Encounter for preprocedural laboratory examination: Secondary | ICD-10-CM

## 2022-10-25 DIAGNOSIS — M5416 Radiculopathy, lumbar region: Secondary | ICD-10-CM

## 2022-10-25 DIAGNOSIS — M4727 Other spondylosis with radiculopathy, lumbosacral region: Secondary | ICD-10-CM | POA: Diagnosis not present

## 2022-10-25 MED ORDER — METHYLPREDNISOLONE ACETATE 40 MG/ML INJ SUSP (RADIOLOG
80.0000 mg | Freq: Once | INTRAMUSCULAR | Status: AC
  Administered 2022-10-25: 80 mg via EPIDURAL

## 2022-10-25 MED ORDER — IOPAMIDOL (ISOVUE-M 200) INJECTION 41%
1.0000 mL | Freq: Once | INTRAMUSCULAR | Status: AC
  Administered 2022-10-25: 1 mL via EPIDURAL

## 2022-10-25 NOTE — Discharge Instructions (Signed)
Post Procedure Spinal Discharge Instruction Sheet  You may resume a regular diet and any medications that you routinely take (including pain medications) unless otherwise noted by MD.  No driving day of procedure.  Light activity throughout the rest of the day.  Do not do any strenuous work, exercise, bending or lifting.  The day following the procedure, you can resume normal physical activity but you should refrain from exercising or physical therapy for at least three days thereafter.  You may apply ice to the injection site, 20 minutes on, 20 minutes off, as needed. Do not apply ice directly to skin.    Common Side Effects:  Headaches- take your usual medications as directed by your physician.  Increase your fluid intake.  Caffeinated beverages may be helpful.  Lie flat in bed until your headache resolves.  Restlessness or inability to sleep- you may have trouble sleeping for the next few days.  Ask your referring physician if you need any medication for sleep.  Facial flushing or redness- should subside within a few days.  Increased pain- a temporary increase in pain a day or two following your procedure is not unusual.  Take your pain medication as prescribed by your referring physician.  Leg cramps  Please contact our office at [phone] for the following symptoms: Fever greater than 100 degrees. Headaches unresolved with medication after 2-3 days. Increased swelling, pain, or redness at injection site.    YOU MAY RESUME YOUR ASPIRIN ANYTIME AFTER PROCEDURE TODAY AND YOUR ELIQUIS 24 HOURS AFTER PROCEDURE  Thank you for visiting [facility] today.

## 2022-10-25 NOTE — Telephone Encounter (Signed)
Reaching out to patient to offer assistance regarding upcoming cardiac imaging study; pt verbalizes understanding of appt date/time, parking situation and where to check in, and verified current allergies; name and call back number provided for further questions should they arise  Gordy Clement RN Navigator Cardiac Imaging [facility] [phone] office [phone] cell  Patient has had an MRI without incident. He is aware to obtains lab prior to his appt.

## 2022-10-26 ENCOUNTER — Other Ambulatory Visit: Payer: Medicare Other

## 2022-10-26 DIAGNOSIS — Z01812 Encounter for preprocedural laboratory examination: Secondary | ICD-10-CM | POA: Diagnosis not present

## 2022-10-27 ENCOUNTER — Ambulatory Visit (HOSPITAL_COMMUNITY)
Admission: RE | Admit: 2022-10-27 | Discharge: 2022-10-27 | Disposition: A | Discharge status: Home / Self Care | Payer: Medicare Other | Source: Ambulatory Visit | Attending: Cardiology | Admitting: Cardiology

## 2022-10-27 ENCOUNTER — Other Ambulatory Visit: Payer: Self-pay | Admitting: Cardiology

## 2022-10-27 DIAGNOSIS — I5042 Chronic combined systolic (congestive) and diastolic (congestive) heart failure: Secondary | ICD-10-CM | POA: Insufficient documentation

## 2022-10-27 LAB — HEMOGLOBIN AND HEMATOCRIT, BLOOD
Hematocrit: 42.1 % (ref 37.5–51.0)
Hemoglobin: 14 g/dL (ref 13.0–17.7)

## 2022-10-27 MED ORDER — GADOBUTROL 1 MMOL/ML IV SOLN
10.0000 mL | Freq: Once | INTRAVENOUS | Status: AC | PRN
  Administered 2022-10-27: 10 mL via INTRAVENOUS

## 2022-11-11 ENCOUNTER — Other Ambulatory Visit: Payer: Self-pay | Admitting: Family Medicine

## 2022-11-11 ENCOUNTER — Ambulatory Visit
Admission: RE | Admit: 2022-11-11 | Discharge: 2022-11-11 | Disposition: A | Discharge status: Home / Self Care | Payer: Medicare Other | Source: Ambulatory Visit | Attending: Family Medicine | Admitting: Family Medicine

## 2022-11-11 DIAGNOSIS — M48062 Spinal stenosis, lumbar region with neurogenic claudication: Secondary | ICD-10-CM

## 2022-11-11 DIAGNOSIS — M542 Cervicalgia: Secondary | ICD-10-CM

## 2022-11-11 DIAGNOSIS — M5416 Radiculopathy, lumbar region: Secondary | ICD-10-CM

## 2022-11-11 DIAGNOSIS — F17200 Nicotine dependence, unspecified, uncomplicated: Secondary | ICD-10-CM

## 2022-11-11 DIAGNOSIS — R079 Chest pain, unspecified: Secondary | ICD-10-CM

## 2022-11-11 DIAGNOSIS — R222 Localized swelling, mass and lump, trunk: Secondary | ICD-10-CM

## 2022-11-11 DIAGNOSIS — G5602 Carpal tunnel syndrome, left upper limb: Secondary | ICD-10-CM

## 2022-11-11 DIAGNOSIS — M25522 Pain in left elbow: Secondary | ICD-10-CM

## 2022-11-11 DIAGNOSIS — M4722 Other spondylosis with radiculopathy, cervical region: Secondary | ICD-10-CM | POA: Diagnosis not present

## 2022-11-11 DIAGNOSIS — I739 Peripheral vascular disease, unspecified: Secondary | ICD-10-CM

## 2022-11-11 DIAGNOSIS — M79604 Pain in right leg: Secondary | ICD-10-CM

## 2022-11-11 MED ORDER — IOPAMIDOL (ISOVUE-M 300) INJECTION 61%
1.0000 mL | Freq: Once | INTRAMUSCULAR | Status: AC
  Administered 2022-11-11: 1 mL via EPIDURAL

## 2022-11-11 MED ORDER — TRIAMCINOLONE ACETONIDE 40 MG/ML IJ SUSP (RADIOLOGY)
60.0000 mg | Freq: Once | INTRAMUSCULAR | Status: AC
  Administered 2022-11-11: 60 mg via EPIDURAL

## 2022-11-11 MED ORDER — IOPAMIDOL (ISOVUE-M 200) INJECTION 41%: 1.0000 mL | Freq: Once | INTRAMUSCULAR | Status: DC

## 2022-11-11 MED ORDER — METHYLPREDNISOLONE ACETATE 40 MG/ML INJ SUSP (RADIOLOG: 80.0000 mg | Freq: Once | INTRAMUSCULAR | Status: DC

## 2022-11-11 NOTE — Discharge Instructions (Signed)
Post Procedure Spinal Discharge Instruction Sheet  You may resume a regular diet and any medications that you routinely take (including pain medications) unless otherwise noted by MD.  No driving day of procedure.  Light activity throughout the rest of the day.  Do not do any strenuous work, exercise, bending or lifting.  The day following the procedure, you can resume normal physical activity but you should refrain from exercising or physical therapy for at least three days thereafter.  You may apply ice to the injection site, 20 minutes on, 20 minutes off, as needed. Do not apply ice directly to skin.    Common Side Effects:  Headaches- take your usual medications as directed by your physician.  Increase your fluid intake.  Caffeinated beverages may be helpful.  Lie flat in bed until your headache resolves.  Restlessness or inability to sleep- you may have trouble sleeping for the next few days.  Ask your referring physician if you need any medication for sleep.  Facial flushing or redness- should subside within a few days.  Increased pain- a temporary increase in pain a day or two following your procedure is not unusual.  Take your pain medication as prescribed by your referring physician.  Leg cramps  Please contact our office at [phone] for the following symptoms: Fever greater than 100 degrees. Headaches unresolved with medication after 2-3 days. Increased swelling, pain, or redness at injection site.   Thank you for visiting [facility] today.   YOU MAY RESUME YOUR ASPIRIN ANYTIME AFTER PROCEDURE TODAY   YOU MAY RESUME YOUR ELIQUIS 24 HOURS AFTER PROCEDURE

## 2022-12-01 ENCOUNTER — Ambulatory Visit: Payer: Medicare Other | Admitting: Family Medicine

## 2022-12-14 DIAGNOSIS — I10 Essential (primary) hypertension: Secondary | ICD-10-CM | POA: Diagnosis not present

## 2022-12-14 DIAGNOSIS — I25119 Atherosclerotic heart disease of native coronary artery with unspecified angina pectoris: Secondary | ICD-10-CM | POA: Diagnosis not present

## 2022-12-14 DIAGNOSIS — R7303 Prediabetes: Secondary | ICD-10-CM | POA: Diagnosis not present

## 2022-12-14 NOTE — Progress Notes (Unsigned)
[place] Phone: [phone] Subjective:   Gregory Shepherd, am serving as a scribe for Dr. Hulan Saas.  +\ I'm seeing this patient by the request  of:  Kirk Ruths, MD  CC: neck and left elbow pain follow up   [id number]  11/6/223 Attempted injection that did not seem to make significant improvement.  I do feel that the epidural in the neck is the next best step.  Otherwise would need to consider decompression surgery.   Seen cardiology.  He feels that it is stable and will be following up with them.      Degenerative disc disease of the cervical spine.  Patient was found to have nerve entrapment.  I do think that an epidural would be helpful and patient is scheduled in the near future.  Warned of potential side effects, patient hopefully will do well and follow-up with me again in 6 to 8 weeks.  Hopefully will see some improvement in some of the weakness were noted on the left side as well.  Patient knows of worsening headache, increasing weakness of the upper extremity to seek medical attention immediately.  Total time with patient and significant other 31 minutes     Update 12/20/2021 Gregory Shepherd is a 77 y.o. male coming in with complaint of neck and L elbow pain. Patient states that the injection for hand did not help. Numbness in entire hand but Shepherd pain.   Pain is less in leg following epidural. Walking helps to improve the numbness that remains. Standing in one spot increases his symptoms.        Past Medical History:  Diagnosis Date   Carotid artery narrowing    History of blood clots    History of colon polyps    Hyperlipidemia    Hypertension    MI (myocardial infarction) [facility])    Past Surgical History:  Procedure Laterality Date   ABDOMINAL AORTOGRAM W/LOWER EXTREMITY N/A 06/04/2019   Procedure: ABDOMINAL AORTOGRAM W/LOWER EXTREMITY;  Surgeon: Lorretta Harp, MD;   Location: [place] [facility];  Service: Cardiovascular;  Laterality: N/A;   BIOPSY  02/08/2022   Procedure: BIOPSY;  Surgeon: Thornton Park, MD;  Location: [facility];  Service: Gastroenterology;;   CARDIAC SURGERY     x2    CARDIOVERSION N/A 08/25/2021   Procedure: CARDIOVERSION;  Surgeon: Skeet Latch, MD;  Location: [place];  Service: Cardiovascular;  Laterality: N/A;   CARDIOVERSION N/A 11/23/2021   Procedure: CARDIOVERSION;  Surgeon: Thayer Headings, MD;  Location: [facility] ENDOSCOPY;  Service: Cardiovascular;  Laterality: N/A;   COLONOSCOPY     Age 22. [facility]   COLONOSCOPY WITH PROPOFOL N/A 02/08/2022   Procedure: COLONOSCOPY WITH PROPOFOL;  Surgeon: Thornton Park, MD;  Location: [facility];  Service: Gastroenterology;  Laterality: N/A;   CORONARY ARTERY BYPASS GRAFT  1990   x 2 at [facility], redo x 3 @ [place] approx 1998   ESOPHAGOGASTRODUODENOSCOPY (EGD) WITH PROPOFOL N/A 02/08/2022   Procedure: ESOPHAGOGASTRODUODENOSCOPY (EGD) WITH PROPOFOL;  Surgeon: Thornton Park, MD;  [facility];  Service: Gastroenterology;  Laterality: N/A;   PERIPHERAL VASCULAR ATHERECTOMY Right 06/04/2019   Procedure: PERIPHERAL VASCULAR ATHERECTOMY;  Surgeon: Lorretta Harp, MD;  Location: [place] [facility];  Service: Cardiovascular;  Laterality: Right;  common iliac   PERIPHERAL VASCULAR INTERVENTION Right 06/04/2019   Procedure: PERIPHERAL VASCULAR INTERVENTION;  Surgeon: Lorretta Harp, MD;  Location[facility] [address]  INVASIVE CV LAB;  Service: Cardiovascular;  Laterality: Right;  common iliac   POLYPECTOMY  02/08/2022   Procedure: POLYPECTOMY;  Surgeon: Thornton Park, MD;  Location: [facility];  Service: Gastroenterology;;   TEE WITHOUT CARDIOVERSION N/A 08/25/2021   Procedure: TRANSESOPHAGEAL ECHOCARDIOGRAM (TEE);  Surgeon: Skeet Latch, MD;  Location: [place];  Service: Cardiovascular;  Laterality: N/A;   TEE WITHOUT CARDIOVERSION N/A 11/23/2021    Procedure: TRANSESOPHAGEAL ECHOCARDIOGRAM (TEE);  Surgeon: Acie Fredrickson, Wonda Cheng, MD;  Location: [facility] ENDOSCOPY;  Service: Cardiovascular;  Laterality: N/A;   Social History   Socioeconomic History   Marital status: Married    Spouse name: Not on file   Number of children: Not on file   Years of education: Not on file   Highest education level: Not on file  Occupational History   Not on file  Tobacco Use   Smoking status: Every Day    Types: Cigarettes   Smokeless tobacco: Never   Tobacco comments:    8-9 cigarettes per day  Vaping Use   Vaping Use: Never used  Substance and Sexual Activity   Alcohol use: Shepherd   Drug use: Shepherd   Sexual activity: Not on file  Other Topics Concern   Not on file  Social History Narrative   Not on file   Social Determinants of Health   Financial Resource Strain: Not on file  Food Insecurity: Not on file  Transportation Needs: Not on file  Physical Activity: Not on file  Stress: Not on file  Social Connections: Not on file   Allergies  Allergen Reactions   Omeprazole     doesn't work well with plavix    Other Palpitations    Certain steroids give patient heart palpitations  Allergy to green peppers   Family History  Problem Relation Age of Onset   Heart disease Mother    Heart disease Father    Diabetes Sister    Prostate cancer Neg Hx    Bladder Cancer Neg Hx    Kidney cancer Neg Hx    Colon cancer Neg Hx    Esophageal cancer Neg Hx     Current Outpatient Medications (Endocrine & Metabolic):    levothyroxine (SYNTHROID) 50 MCG tablet, Take 50 mcg by mouth daily before breakfast.  Current Outpatient Medications (Cardiovascular):    amiodarone (PACERONE) 200 MG tablet, Take 0.5 tablets (100 mg total) by mouth daily.   atorvastatin (LIPITOR) 80 MG tablet, Take 80 mg by mouth daily.   carvedilol (COREG) 3.125 MG tablet, TAKE 1 TABLET BY MOUTH TWICE A DAY   isosorbide mononitrate (IMDUR) 60 MG 24 hr tablet, Take 1.5 tablets (90 mg total)  by mouth daily.   nitroGLYCERIN (NITROSTAT) 0.4 MG SL tablet, Place 1 tablet (0.4 mg total) under the tongue as needed for chest pain.   torsemide (DEMADEX) 20 MG tablet, Take 2 tablets (40 mg total) by mouth 2 (two) times daily.    Current Outpatient Medications (Hematological):    ELIQUIS 5 MG TABS tablet, TAKE 1 TABLET BY MOUTH TWICE A DAY   vitamin B-12 (CYANOCOBALAMIN) 1000 MCG tablet, Take 1,000 mcg by mouth daily.  Current Outpatient Medications (Other):    Cholecalciferol (VITAMIN D) 50 MCG (2000 UT) CAPS, Take 4,000 Units by mouth daily.   finasteride (PROSCAR) 5 MG tablet, Take 1 tablet (5 mg total) by mouth daily.   gabapentin (NEURONTIN) 300 MG capsule, Take 300 mg by mouth in the morning, at noon, in the evening, and at bedtime.  lansoprazole (PREVACID) 30 MG capsule, TAKE 1 CAPSULE BY MOUTH DAILY   Multiple Vitamin (MULTIVITAMIN) tablet, Take 1 tablet by mouth daily.   potassium chloride (KLOR-CON) 10 MEQ tablet, TAKE 2 TABLETS BY MOUTH TWICE A DAY   traZODone (DESYREL) 50 MG tablet, Take 1 tablet by mouth at bedtime.   vitamin C (ASCORBIC ACID) 500 MG tablet, Take 500 mg by mouth daily.   Reviewed prior external information including notes and imaging from  primary care provider As well as notes that were available from care everywhere and other healthcare systems.  Reviewed patient's labs and did show elevation of the thyroid-stimulating hormone of 8.3.  Does show downtrending of the GFR to 52 and creatinine of 1.4, B12 has been supplemented appropriately  Past medical history, social, surgical and family history all reviewed in electronic medical record.  Shepherd pertanent information unless stated regarding to the chief complaint.   Review of Systems:  Shepherd headache, visual changes, nausea, vomiting, diarrhea, constipation, dizziness, abdominal pain, skin rash, fevers, chills, night sweats, weight loss, swollen lymph nodes,  joint swelling, chest pain, shortness of breath,  mood changes. POSITIVE muscle aches, body aches and numbness  Objective  Blood pressure 110/70, pulse (!) 52, height '6\' 1"'$  (1.854 m), weight 185 lb (83.9 kg), SpO2 98 %.   General: Shepherd apparent distress alert and oriented x3 mood and affect normal, dressed appropriately.  HEENT: Pupils equal, extraocular movements intact  Respiratory: Patient's speak in full sentences and does not appear short of breath  Cardiovascular: Shepherd lower extremity edema, non tender, Shepherd erythema  Patient does have numbness noted in the hands and feet minorly.  Seems to be symmetric.  Mild weakness with grip strength noted.  Neck exam does have loss of lordosis.  Some tenderness to palpation noted.  Limited sidebending and extension of the neck noted.    Impression and Recommendations:    The above documentation has been reviewed and is accurate and complete Lyndal Pulley, DO

## 2022-12-20 ENCOUNTER — Ambulatory Visit: Payer: Medicare Other | Admitting: Family Medicine

## 2022-12-20 ENCOUNTER — Ambulatory Visit: Payer: Self-pay

## 2022-12-20 VITALS — BP 110/70 | HR 52 | Ht 73.0 in | Wt 185.0 lb

## 2022-12-20 DIAGNOSIS — M25522 Pain in left elbow: Secondary | ICD-10-CM | POA: Diagnosis not present

## 2022-12-20 DIAGNOSIS — E039 Hypothyroidism, unspecified: Secondary | ICD-10-CM

## 2022-12-20 NOTE — Patient Instructions (Signed)
Thyroid is playing a role in symptoms See if PCP can increase dose and consider name brand Write me if you have questions

## 2022-12-20 NOTE — Assessment & Plan Note (Signed)
Patient does now have hypothyroidism that is fairly severe.  Patient is on levothyroxine and does not seem to be making significant improvement and TSH did get become even more elevated.  Patient is following up with his primary care provider in the near future.  I do believe that this could be contributing with some of the numbness at this point.  We have already fix most of the other things such as the B12 and vitamin D deficiencies.  Underlying degenerative disc disease of the cervical spine and possible epidurals is still a possibility as well that could be helpful.  Patient does have claudication of the peripheral vascular disease and they are continuing to monitor at the point.  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  Follow-up with me again in 6 to 8 weeks otherwise.

## 2022-12-21 DIAGNOSIS — I10 Essential (primary) hypertension: Secondary | ICD-10-CM | POA: Diagnosis not present

## 2022-12-21 DIAGNOSIS — I25119 Atherosclerotic heart disease of native coronary artery with unspecified angina pectoris: Secondary | ICD-10-CM | POA: Diagnosis not present

## 2022-12-21 DIAGNOSIS — R7303 Prediabetes: Secondary | ICD-10-CM | POA: Diagnosis not present

## 2022-12-21 DIAGNOSIS — I4891 Unspecified atrial fibrillation: Secondary | ICD-10-CM | POA: Diagnosis not present

## 2022-12-22 ENCOUNTER — Other Ambulatory Visit: Payer: Self-pay | Admitting: Nurse Practitioner

## 2022-12-22 ENCOUNTER — Ambulatory Visit: Payer: Medicare Other | Admitting: Physician Assistant

## 2022-12-22 NOTE — Telephone Encounter (Signed)
Please advise. Patient was last seen on 03/03/22.

## 2022-12-24 NOTE — Progress Notes (Signed)
   PCP:  Kirk Ruths, MD Primary Cardiologist: Quay Burow, MD Electrophysiologist: Vickie Epley, MD   Gregory Shepherd is a 77 y.o. male seen today for Vickie Epley, MD for routine electrophysiology followup. Since last being seen in our clinic the patient reports doing about the same.   He has had some stable angina in the evenings (specifically in the night).  He wakes up with mild chest discomfort, gets up, goes to the kitchen, takes a nitro, and the pain is gone and stays gone prior to him getting back in bed. Reports prior h/o angina around dinner time, but none currently.   Myoview 10/08/2022 prior infarct, no ischemia  Past Medical History:  Diagnosis Date   Carotid artery narrowing    History of blood clots    History of colon polyps    Hyperlipidemia    Hypertension    MI (myocardial infarction) ([place])     Current Outpatient Medications  Medication Instructions   amiodarone (PACERONE) 100 mg, Oral, Daily   ascorbic acid (VITAMIN C) 500 mg, Oral, Daily   atorvastatin (LIPITOR) 80 mg, Oral, Daily   carvedilol (COREG) 3.125 MG tablet TAKE 1 TABLET BY MOUTH TWICE A DAY   cyanocobalamin (VITAMIN B12) 1,000 mcg, Oral, Daily   ELIQUIS 5 MG TABS tablet TAKE 1 TABLET BY MOUTH TWICE A DAY   finasteride (PROSCAR) 5 mg, Oral, Daily   gabapentin (NEURONTIN) 300 mg, Oral, 4 times daily   isosorbide mononitrate (IMDUR) 90 mg, Oral, Daily   lansoprazole (PREVACID) 30 mg, Oral, Daily   levothyroxine (SYNTHROID) 75 mcg, Oral, Daily before breakfast   Multiple Vitamin (MULTIVITAMIN) tablet 1 tablet, Oral, Daily   nitroGLYCERIN (NITROSTAT) 0.4 mg, Sublingual, As needed   potassium chloride (KLOR-CON) 10 MEQ tablet 20 mEq, Oral, 2 times daily   torsemide (DEMADEX) 40 mg, Oral, 2 times daily   traZODone (DESYREL) 50 MG tablet 1 tablet, Oral, Daily at bedtime   Vitamin D 4,000 Units, Oral, Daily    Physical Exam: Vitals:   12/27/22 1201  BP: (!) 124/58  Pulse: (!)  52  SpO2: 96%  Weight: 186 lb (84.4 kg)  Height: '6\' 1"'$  (1.854 m)    GEN- NAD. A&O x 3. Normal affect. HEENT: normocephalic, atraumatic Lungs- CTAB, Normal effort Heart- Slow but regular rate and rhythm, No M/G/R Extremities- No peripheral edema. no clubbing or cyanosis; Skin- warm and dry, no rash or lesion  EKG is ordered today. Personal review shows sinus bradycardia 52 bpm  Additional studies reviewed include: Previous EP notes.   Assessment and Plan:  1. Chronic systolic CHF EF 16% my cMRI 10/2022. RV insertion site LGE. Moderate LVH.  Volume status stable on exam.  Continue torsemide 40 mg BID.  Encouraged fluid and sodium restriction.  EF has improved now in NSR.    2. Persistent atrial fibrillation EKG 12/15/2021 showed bradycardia sinus bradycardia s/p [facility] 11/23/21 Continue Eliquis. Stressed importance of no missed doses Continue amiodarone 100 mg daily. CMET OK 12/11/2022, TSH elevated Repeat TSH and check free T4 and free T3 as well   3. CAD s/p CABG Currently having stable angina, resolving with single nitro Needs to take his imdur 90 mg daily instead of split Myoview 10/2022 with prior infarct but no ischemia  Reviewed alarm symptoms  Follow up with Dr. Quentin Ore in 6 months, sooner with issues  Shirley Friar, PA-C  12/27/22 12:23 PM

## 2022-12-27 ENCOUNTER — Ambulatory Visit: Payer: Medicare Other | Attending: Physician Assistant | Admitting: Student

## 2022-12-27 ENCOUNTER — Encounter: Payer: Self-pay | Admitting: Student

## 2022-12-27 VITALS — BP 124/58 | HR 52 | Ht 73.0 in | Wt 186.0 lb

## 2022-12-27 DIAGNOSIS — I5042 Chronic combined systolic (congestive) and diastolic (congestive) heart failure: Secondary | ICD-10-CM | POA: Diagnosis not present

## 2022-12-27 DIAGNOSIS — Z79899 Other long term (current) drug therapy: Secondary | ICD-10-CM | POA: Diagnosis not present

## 2022-12-27 DIAGNOSIS — I4819 Other persistent atrial fibrillation: Secondary | ICD-10-CM

## 2022-12-27 DIAGNOSIS — I25708 Atherosclerosis of coronary artery bypass graft(s), unspecified, with other forms of angina pectoris: Secondary | ICD-10-CM

## 2022-12-27 NOTE — Patient Instructions (Addendum)
Medication Instructions:  Your physician recommends that you continue on your current medications as directed. Please refer to the Current Medication list given to you today.  *If you need a refill on your cardiac medications before your next appointment, please call your pharmacy*   Lab Work: TODAY: TSH, FreeT4, FreeT3  If you have labs (blood work) drawn today and your tests are completely normal, you will receive your results only by: [place] (if you have MyChart) OR A paper copy in the mail If you have any lab test that is abnormal or we need to change your treatment, we will call you to review the results.   Follow-Up: At [facility], you and your health needs are our priority.  As part of our continuing mission to provide you with exceptional heart care, we have created designated Provider Care Teams.  These Care Teams include your primary Cardiologist (physician) and Advanced Practice Providers (APPs -  Physician Assistants and Nurse Practitioners) who all work together to provide you with the care you need, when you need it.   Your next appointment:   6 month(s)  Provider:   Lars Mage, MD

## 2022-12-29 LAB — TSH: TSH: 4.47 u[IU]/mL (ref 0.450–4.500)

## 2022-12-29 LAB — T4, FREE: Free T4: 1.53 ng/dL (ref 0.82–1.77)

## 2022-12-29 LAB — T3, FREE: T3, Free: 2.3 pg/mL (ref 2.0–4.4)

## 2023-01-05 ENCOUNTER — Other Ambulatory Visit: Payer: Self-pay | Admitting: Student

## 2023-01-11 ENCOUNTER — Ambulatory Visit: Payer: Medicare Other | Attending: Cardiovascular Disease | Admitting: Cardiovascular Disease

## 2023-01-11 ENCOUNTER — Encounter: Payer: Self-pay | Admitting: Cardiovascular Disease

## 2023-01-11 VITALS — BP 116/50 | HR 52 | Ht 72.0 in | Wt 185.6 lb

## 2023-01-11 DIAGNOSIS — Z951 Presence of aortocoronary bypass graft: Secondary | ICD-10-CM

## 2023-01-11 DIAGNOSIS — E782 Mixed hyperlipidemia: Secondary | ICD-10-CM | POA: Diagnosis not present

## 2023-01-11 DIAGNOSIS — I739 Peripheral vascular disease, unspecified: Secondary | ICD-10-CM

## 2023-01-11 DIAGNOSIS — I4819 Other persistent atrial fibrillation: Secondary | ICD-10-CM

## 2023-01-11 DIAGNOSIS — E785 Hyperlipidemia, unspecified: Secondary | ICD-10-CM

## 2023-01-11 DIAGNOSIS — I10 Essential (primary) hypertension: Secondary | ICD-10-CM | POA: Diagnosis not present

## 2023-01-11 NOTE — Patient Instructions (Signed)
Medication Instructions:  Your physician recommends that you continue on your current medications as directed. Please refer to the Current Medication list given to you today.  *If you need a refill on your cardiac medications before your next appointment, please call your pharmacy*   Follow-Up: At [facility], you and your health needs are our priority.  As part of our continuing mission to provide you with exceptional heart care, we have created designated Provider Care Teams.  These Care Teams include your primary Cardiologist (physician) and Advanced Practice Providers (APPs -  Physician Assistants and Nurse Practitioners) who all work together to provide you with the care you need, when you need it.  We recommend signing up for the patient portal called "MyChart".  Sign up information is provided on this After Visit Summary.  MyChart is used to connect with patients for Virtual Visits (Telemedicine).  Patients are able to view lab/test results, encounter notes, upcoming appointments, etc.  Non-urgent messages can be sent to your provider as well.   To learn more about what you can do with MyChart, go to [email or web address].    Your next appointment:   6 month(s)  Provider:   Almyra Deforest, PA-C      Then, Quay Burow, MD will plan to see you again in 12 month(s).

## 2023-01-11 NOTE — Progress Notes (Signed)
01/11/2023 [name]   1946/11/04  [id number]  Primary Physician Kirk Ruths, MD Primary Cardiologist: Lorretta Harp MD FACP, [place], [place], [place]  HPI:  Gregory Shepherd is a 77 y.o.  mild to moderately overweight married Caucasian male father of 2, grandfather of 3 grandchildren who I last saw in the office 10/06/2022.  He is accompanied by his wife Gregory Shepherd today.  He was referred by Dr. Gardenia Phlegm for peripheral vascular valuation because of claudication.  He is retired from being in [employer]  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  He has treated hypertension hyperlipidemia.  His father did have a myocardial infarction.  He had CABG x2 at [facility] in [place] by Dr. Eliberto Ivory.  He did have a remote MI at age 42 as well.  He had redo bypass surgery x3 at [place] 8 years later.      His major issue is with left lower extremity numbness and claudication.  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  He has high-frequency signals in both iliac arteries and monophasic waveforms below that on the right.   We proceeded with lower extremity angiography and intervention in 06/04/2019 revealing a 99% calcified ostial/proximal right common iliac artery stenosis and a 60 to 70% left common iliac artery stenosis.  Performed diamondback orbital rotational atherectomy, PTA and stenting with an excellent result.  His Dopplers normalized and his claudication resolved.   He did undergo DC cardioversion at the end of December 2022 after being put on amiodarone.  He has maintained sinus rhythm/sinus bradycardia on amiodarone, low-dose carvedilol and Eliquis.  He was seen in the office by Dr. Gardiner Rhyme 08/06/2022 with hypertension but apparently this was an artifact of his wrist blood pressure monitor since his blood  pressures were normal with a cuff blood pressure monitor.  He was placed on losartan which he stopped because of hypotension.   He was complaining of nocturnal angina in the late evening hours which has occasionally awaken him from sleep.  This is relieved with 1 sublingual nitroglycerin.  He does not get chest pain while walking or during exertion.  Otherwise he does not have symptoms of heart failure and has maintained sinus rhythm.  He complains of some numbness in his right leg which sounds neurogenic but denies claudication or symptoms similar to he had prior to intervention 3 years ago.  His Dopplers done February of this year showed continued patency of his right iliac stent with normal ABIs.  Since I saw him 3 months ago he did have a Myoview stress test that was nonischemic.  After adjusting his Imdur from 60 to 90 mg a day his chest pain has resolved.  He does have some numbness in his right foot which sounds radicular versus neurogenic but not vascular.  Current Meds  Medication Sig   amiodarone (PACERONE) 200 MG tablet Take 0.5 tablets (100 mg total) by mouth daily.   atorvastatin (LIPITOR) 80 MG tablet Take 80 mg by mouth daily.   carvedilol (COREG) 3.125 MG tablet TAKE 1 TABLET BY MOUTH TWICE A DAY   Cholecalciferol (VITAMIN D) 50 MCG (2000 UT) CAPS Take 4,000 Units by mouth daily.   ELIQUIS 5 MG TABS tablet TAKE 1 TABLET BY MOUTH TWICE A DAY   finasteride (PROSCAR)  5 MG tablet Take 1 tablet (5 mg total) by mouth daily.   gabapentin (NEURONTIN) 300 MG capsule Take 300 mg by mouth in the morning, at noon, in the evening, and at bedtime.   isosorbide mononitrate (IMDUR) 60 MG 24 hr tablet Take 1.5 tablets (90 mg total) by mouth daily.   lansoprazole (PREVACID) 30 MG capsule TAKE 1 CAPSULE BY MOUTH DAILY   levothyroxine (SYNTHROID) 75 MCG tablet Take 75 mcg by mouth daily before breakfast.   Multiple Vitamin (MULTIVITAMIN) tablet Take 1 tablet by mouth daily.   nitroGLYCERIN (NITROSTAT)  0.4 MG SL tablet Place 1 tablet (0.4 mg total) under the tongue as needed for chest pain.   potassium chloride (KLOR-CON) 10 MEQ tablet TAKE 2 TABLETS BY MOUTH TWICE A DAY   torsemide (DEMADEX) 20 MG tablet Take 1 tablet (20 mg total) by mouth 2 (two) times daily.   traZODone (DESYREL) 50 MG tablet Take 1 tablet by mouth at bedtime.   vitamin B-12 (CYANOCOBALAMIN) 1000 MCG tablet Take 1,000 mcg by mouth daily.   vitamin C (ASCORBIC ACID) 500 MG tablet Take 500 mg by mouth daily.     Allergies  Allergen Reactions   Omeprazole     doesn't work well with plavix    Other Palpitations    Certain steroids give patient heart palpitations  Allergy to green peppers    Social History   Socioeconomic History   Marital status: Married    Spouse name: Not on file   Number of children: Not on file   Years of education: Not on file   Highest education level: Not on file  Occupational History   Not on file  Tobacco Use   Smoking status: Every Day    Types: Cigarettes   Smokeless tobacco: Never   Tobacco comments:    8-9 cigarettes per day  Vaping Use   Vaping Use: Never used  Substance and Sexual Activity   Alcohol use: No   Drug use: No   Sexual activity: Not on file  Other Topics Concern   Not on file  Social History Narrative   Not on file   Social Determinants of Health   Financial Resource Strain: Not on file  Food Insecurity: Not on file  Transportation Needs: Not on file  Physical Activity: Not on file  Stress: Not on file  Social Connections: Not on file  Intimate Partner Violence: Not on file     Review of Systems: General: negative for chills, fever, night sweats or weight changes.  Cardiovascular: negative for chest pain, dyspnea on exertion, edema, orthopnea, palpitations, paroxysmal nocturnal dyspnea or shortness of breath Dermatological: negative for rash Respiratory: negative for cough or wheezing Urologic: negative for hematuria Abdominal: negative for  nausea, vomiting, diarrhea, bright red blood per rectum, melena, or hematemesis Neurologic: negative for visual changes, syncope, or dizziness All other systems reviewed and are otherwise negative except as noted above.    Blood pressure (!) 116/50, Shepherd (!) 52, height 6' (1.829 m), weight 185 lb 9.6 oz (84.2 kg), SpO2 97 %.  General appearance: alert and no distress Neck: no adenopathy, no carotid bruit, no JVD, supple, symmetrical, trachea midline, and thyroid not enlarged, symmetric, no tenderness/mass/nodules Lungs: clear to auscultation bilaterally Heart: regular rate and rhythm, S1, S2 normal, no murmur, click, rub or gallop Extremities: extremities normal, atraumatic, no cyanosis or edema Pulses: 2+ and symmetric Skin: Skin color, texture, turgor normal. No rashes or lesions Neurologic: Grossly normal  EKG not performed  today  ASSESSMENT AND PLAN:   Hyperlipemia History of hyperlipidemia on high-dose statin therapy with lipid profile from followed by his PCP  Hx of CABG History of CAD status post coronary bypass grafting x 2 [facility] in [place] with 3 bypass 8 years later.  Because of chest pain he recently underwent Myoview stress testing 10/08/2022 that did not show ischemic changes.  I did increase his Imdur from 60 to 90 mg a day which resulted in marked improvement in his chest pain symptoms.  Essential (primary) hypertension History of essential hypertension a blood pressure measured today at 116/50.  He is on carvedilol.  Peripheral arterial disease (HCC) History of peripheral arterial disease status post right common iliac artery orbital atherectomy, PTA and covered stenting 06/04/2019 with Dopplers performed recently that showed a widely patent right common iliac artery.  He denies claudication.  Dyslipidemia, goal LDL below 70 History of dyslipidemia on statin therapy with lipid profile performed 1 month ago that revealed total cholesterol 126, LDL  of 47 and HDL 31.  Persistent atrial fibrillation (HCC) History of atrial fibrillation status post DC cardioversion December 2022 after being put on amiodarone.  He is followed by Dr. Lars Mage.  He remains in sinus rhythm on 100 mg of amiodarone and Eliquis oral anticoagulation.     Lorretta Harp MD FACP,FACC,FAHA, [facility] 01/11/2023 1:59 PM

## 2023-01-11 NOTE — Assessment & Plan Note (Signed)
History of dyslipidemia on statin therapy with lipid profile performed 1 month ago that revealed total cholesterol 126, LDL of 47 and HDL 31.

## 2023-01-11 NOTE — Assessment & Plan Note (Signed)
History of CAD status post coronary bypass grafting x 2 [facility] in [place] with 3 bypass 8 years later.  Because of chest pain he recently underwent Myoview stress testing 10/08/2022 that did not show ischemic changes.  I did increase his Imdur from 60 to 90 mg a day which resulted in marked improvement in his chest pain symptoms.

## 2023-01-11 NOTE — Assessment & Plan Note (Signed)
History of atrial fibrillation status post DC cardioversion December 2022 after being put on amiodarone.  He is followed by Dr. Lars Mage.  He remains in sinus rhythm on 100 mg of amiodarone and Eliquis oral anticoagulation.

## 2023-01-11 NOTE — Assessment & Plan Note (Signed)
History of essential hypertension a blood pressure measured today at 116/50.  He is on carvedilol.

## 2023-01-11 NOTE — Assessment & Plan Note (Signed)
History of peripheral arterial disease status post right common iliac artery orbital atherectomy, PTA and covered stenting 06/04/2019 with Dopplers performed recently that showed a widely patent right common iliac artery.  He denies claudication.

## 2023-01-11 NOTE — Assessment & Plan Note (Signed)
History of hyperlipidemia on high-dose statin therapy with lipid profile from followed by his PCP

## 2023-01-12 ENCOUNTER — Other Ambulatory Visit: Payer: Self-pay | Admitting: Student

## 2023-01-13 ENCOUNTER — Encounter (HOSPITAL_COMMUNITY): Payer: Self-pay | Admitting: *Deleted

## 2023-01-25 ENCOUNTER — Other Ambulatory Visit: Payer: Self-pay | Admitting: Cardiovascular Disease

## 2023-01-25 DIAGNOSIS — I4819 Other persistent atrial fibrillation: Secondary | ICD-10-CM

## 2023-01-25 NOTE — Telephone Encounter (Signed)
Prescription refill request for Eliquis received. Indication: Afib  Last office visit: 01/11/23 [name])  Scr: 1.4 (12/14/22)  Age: 77 Weight: 84.2kg  Appropriate dose. Refill sent.

## 2023-02-07 ENCOUNTER — Ambulatory Visit (HOSPITAL_COMMUNITY)
Admission: RE | Admit: 2023-02-07 | Discharge: 2023-02-07 | Disposition: A | Discharge status: Home / Self Care | Payer: Medicare Other | Source: Ambulatory Visit | Attending: Cardiovascular Disease | Admitting: Cardiovascular Disease

## 2023-02-07 ENCOUNTER — Ambulatory Visit (HOSPITAL_BASED_OUTPATIENT_CLINIC_OR_DEPARTMENT_OTHER)
Admission: RE | Admit: 2023-02-07 | Discharge: 2023-02-07 | Disposition: A | Discharge status: Home / Self Care | Payer: Medicare Other | Source: Ambulatory Visit | Attending: Cardiovascular Disease | Admitting: Cardiovascular Disease

## 2023-02-07 DIAGNOSIS — Z95828 Presence of other vascular implants and grafts: Secondary | ICD-10-CM | POA: Insufficient documentation

## 2023-02-08 LAB — VAS US ABI WITH/WO TBI
Left ABI: 0.97
Right ABI: 1

## 2023-02-10 ENCOUNTER — Other Ambulatory Visit: Payer: Self-pay | Admitting: Student

## 2023-03-16 DIAGNOSIS — L570 Actinic keratosis: Secondary | ICD-10-CM | POA: Diagnosis not present

## 2023-03-16 DIAGNOSIS — L821 Other seborrheic keratosis: Secondary | ICD-10-CM | POA: Diagnosis not present

## 2023-03-16 DIAGNOSIS — D2261 Melanocytic nevi of right upper limb, including shoulder: Secondary | ICD-10-CM | POA: Diagnosis not present

## 2023-03-16 DIAGNOSIS — D2262 Melanocytic nevi of left upper limb, including shoulder: Secondary | ICD-10-CM | POA: Diagnosis not present

## 2023-03-16 DIAGNOSIS — D225 Melanocytic nevi of trunk: Secondary | ICD-10-CM | POA: Diagnosis not present

## 2023-03-21 ENCOUNTER — Other Ambulatory Visit: Payer: Self-pay | Admitting: Cardiovascular Disease

## 2023-03-21 DIAGNOSIS — R072 Precordial pain: Secondary | ICD-10-CM

## 2023-03-21 DIAGNOSIS — E039 Hypothyroidism, unspecified: Secondary | ICD-10-CM | POA: Diagnosis not present

## 2023-04-12 ENCOUNTER — Other Ambulatory Visit: Payer: Self-pay | Admitting: Cardiology

## 2023-05-05 DIAGNOSIS — N401 Enlarged prostate with lower urinary tract symptoms: Secondary | ICD-10-CM | POA: Diagnosis not present

## 2023-05-05 DIAGNOSIS — R3914 Feeling of incomplete bladder emptying: Secondary | ICD-10-CM | POA: Diagnosis not present

## 2023-05-05 DIAGNOSIS — R351 Nocturia: Secondary | ICD-10-CM | POA: Diagnosis not present

## 2023-05-18 ENCOUNTER — Encounter: Payer: Self-pay | Admitting: Family Medicine

## 2023-05-19 ENCOUNTER — Other Ambulatory Visit: Payer: Self-pay

## 2023-05-19 DIAGNOSIS — R29898 Other symptoms and signs involving the musculoskeletal system: Secondary | ICD-10-CM

## 2023-05-24 ENCOUNTER — Encounter: Payer: Self-pay | Admitting: Neurology

## 2023-05-24 ENCOUNTER — Other Ambulatory Visit: Payer: Self-pay

## 2023-05-24 DIAGNOSIS — R202 Paresthesia of skin: Secondary | ICD-10-CM

## 2023-06-03 ENCOUNTER — Ambulatory Visit: Payer: Medicare Other | Admitting: Neurology

## 2023-06-03 DIAGNOSIS — R202 Paresthesia of skin: Secondary | ICD-10-CM

## 2023-06-03 DIAGNOSIS — G5602 Carpal tunnel syndrome, left upper limb: Secondary | ICD-10-CM

## 2023-06-03 DIAGNOSIS — G5622 Lesion of ulnar nerve, left upper limb: Secondary | ICD-10-CM

## 2023-06-03 NOTE — Procedures (Signed)
[facility]  [address] [place], [address]  [place], [place] [address] Tel: [phone] Fax: [phone] Test Date:  06/03/2023  Patient: Gregory Shepherd DOB: 05/26/1946 Physician: Nita Sickle, DO  Sex: Male Height: 6\' 0"  Ref Phys: Antoine Primas, DO  ID#: [id number]   Technician:    History: This is a 77 year old man referred for evaluation of left hand weakness and right foot paresthesias.  NCV & EMG Findings: Extensive electrodiagnostic testing of the left upper extremity and right lower extremity shows:  Left median, right sural, and right superficial peroneal sensory responses are within normal limits.  Left mixed palmar sensory response shows prolonged latency.  Left ulnar sensory response shows prolonged latency. Left median motor response shows reduced amplitude (L3.7 mV).  Left ulnar motor response shows slowed conduction velocity across the elbow (A Elbow-B Elbow, 43 m/s).  Right peroneal motor responses reduced at the extensor digitorum brevis, and normal at the tibial tibialis anterior.  Right tibial motor response shows reduced amplitude. In the left upper extremity, chronic motor axon loss changes are seen affecting the first dorsal interosseous and abductor pollicis brevis, without accompanying active denervation.  Of note there was a pattern of incomplete motor unit activation as seen by variable motor unit recruitment; these findings may be due to pain, poor effort, or central disorder of motor unit control. In the right lower extremity, chronic motor axon loss changes are seen affecting the tibialis anterior and gastrocnemius muscles.  Needle examination was not performed on proximal and deep muscles as the patient is on anticoagulation therapy.  Impression: Left median neuropathy at or distal to the wrist, consistent with a clinical diagnosis of carpal tunnel syndrome.  Overall, these findings are moderate in degree electrically. Left ulnar neuropathy with slowing  across the elbow, mild. Of note there was a pattern of incomplete motor unit activation in the left upper extremity as seen by variable motor unit recruitment; these findings may be due to pain, poor effort, or central disorder of motor unit control. In the right lower extremity, needle electrode examination was limited due to patient being on anticoagulation therapy.  Findings may suggest a right L5-S1 radiculopathy.  Correlate clinically. Specifically, there is no evidence of a large fiber sensorimotor polyneuropathy affecting the left lower extremity.  ___________________________ Nita Sickle, DO   Nerve Conduction Studies   Stim Site NR Peak (ms) Norm Peak (ms) O-P Amp (V) Norm O-P Amp  Left Median Anti Sensory (2nd Digit)  32 C  Wrist    3.5 <3.8 19.5 >10  Right Sup Peroneal Anti Sensory (Ant Lat Mall)  32 C  12 cm    2.9 <4.6 4.4 >3  Right Sural Anti Sensory (Lat Mall)  32 C  Calf    3.2 <4.6 5.1 >3  Left Ulnar Anti Sensory (5th Digit)  32 C  Wrist    *3.6 <3.2 9.0 >5     Stim Site NR Onset (ms) Norm Onset (ms) O-P Amp (mV) Norm O-P Amp Site1 Site2 Delta-0 (ms) Dist (cm) Vel (m/s) Norm Vel (m/s)  Left Median Motor (Abd Poll Brev)  32 C  Wrist    3.5 <4.0 *3.7 >5 Elbow Wrist 6.1 31.0 51 >50  Elbow    9.6  3.6         Right Peroneal Motor (Ext Dig Brev)  32 C  Ankle    3.4 <6.0 *1.5 >2.5 B Fib Ankle 10.2 42.0 41 >40  B Fib    13.6  1.4  Poplt B Fib 2.5 10.0 40 >40  Poplt    16.1  1.4         Right Peroneal TA Motor (Tib Ant)  32 C  Fib Head    3.7 <4.5 3.1 >3 Poplit Fib Head 1.8 10.0 56 >40  Poplit    5.5 <5.7 2.8         Right Tibial Motor (Abd Hall Brev)  32 C  Ankle    3.8 <6.0 *2.6 >4 Knee Ankle 11.4 46.0 40 >40  Knee    15.2  2.5         Left Ulnar Motor (Abd Dig Minimi)  32 C  Wrist    2.6 <3.1 7.5 >7 B Elbow Wrist 4.4 23.0 52 >50  B Elbow    7.0  7.5  A Elbow B Elbow 2.3 10.0 *43 >50  A Elbow    9.3  7.3            Stim Site NR Peak (ms) Norm Peak (ms)  P-T Amp (V) Site1 Site2 Delta-P (ms) Norm Delta (ms)  Left Median/Ulnar Palm Comparison (Wrist - 8cm)  32 C  Median Palm    *2.5 <2.2 16.5 Median Palm Ulnar Palm *0.7   Ulnar Palm    1.8 <2.2 11.0       Electromyography   Side Muscle Ins.Act Fibs Fasc Recrt Amp Dur Poly Activation Comment  Left 1stDorInt Nml Nml Nml *3- *1+ *1+ *1+ Mod-V N/A  Left Abd Poll Brev Nml Nml Nml *3- *1+ *1+ *1+ Mod-V N/A  Left Biceps Nml Nml Nml Nml Nml Nml Nml Nml N/A  Left Triceps Nml Nml Nml Nml Nml Nml Nml Nml N/A  Left Deltoid Nml Nml Nml Nml Nml Nml Nml Nml N/A  Left Ext Indicis Nml Nml Nml *2- Nml Nml Nml Mod-V N/A  Right AntTibialis Nml Nml Nml *1- *1+ *1+ *1+ Nml N/A  Right Gastroc Nml Nml Nml *1- *1+ *1+ *1+ Nml N/A  Right RectFemoris Nml Nml Nml Nml Nml Nml Nml Nml N/A  Right BicepsFemS Nml Nml Nml *1- *1+ *1+ *1+ Nml N/A      Waveforms:

## 2023-06-07 ENCOUNTER — Encounter: Payer: Self-pay | Admitting: Family Medicine

## 2023-06-23 ENCOUNTER — Encounter: Payer: Medicare Other | Admitting: Neurology

## 2023-06-24 ENCOUNTER — Encounter: Payer: Medicare Other | Admitting: Neurology

## 2023-06-28 NOTE — Progress Notes (Unsigned)
[name] [address] [place] [address] Phone: [phone] Subjective:   Gregory Shepherd, am serving as a scribe for Dr. Antoine Primas.  I'm seeing this patient by the request  of:  Lauro Regulus, MD  CC: Left wrist and arm pain  [id number]  Gregory Shepherd is a 77 y.o. male coming in with complaint of L wrist pain.. Last seen in January for L elbow pain. Patient states left hand numbness, has been chronic. Getting worse. R leg is numb. Hard to stand. Getting worse. Patient did have a nerve conduction study showing left carpal tunnel syndrome.  Patient also has peripheral neuropathy noted.  Found to have some cervical radiculopathy and was to have the possibility of an epidural again.     Past Medical History:  Diagnosis Date   Carotid artery narrowing    History of blood clots    History of colon polyps    Hyperlipidemia    Hypertension    MI (myocardial infarction) [facility])    Past Surgical History:  Procedure Laterality Date   ABDOMINAL AORTOGRAM W/LOWER EXTREMITY N/A 06/04/2019   Procedure: ABDOMINAL AORTOGRAM W/LOWER EXTREMITY;  Surgeon: Runell Gess, MD;  Location: [facility] INVASIVE CV LAB;  Service: Cardiovascular;  Laterality: N/A;   BIOPSY  02/08/2022   Procedure: BIOPSY;  Surgeon: Tressia Danas, MD;  Location: [facility] ENDOSCOPY;  Service: Gastroenterology;;   CARDIAC SURGERY     x2    CARDIOVERSION N/A 08/25/2021   Procedure: CARDIOVERSION;  Surgeon: Chilton Si, MD;  Location: [facility] ENDOSCOPY;  Service: Cardiovascular;  Laterality: N/A;   CARDIOVERSION N/A 11/23/2021   Procedure: CARDIOVERSION;  Surgeon: Vesta Mixer, MD;  Location: [facility] ENDOSCOPY;  Service: Cardiovascular;  Laterality: N/A;   COLONOSCOPY     Age 27. [facility]   COLONOSCOPY WITH PROPOFOL N/A 02/08/2022   Procedure: COLONOSCOPY WITH PROPOFOL;  Surgeon: Tressia Danas, MD;  Location: [facility] ENDOSCOPY;  Service: Gastroenterology;   Laterality: N/A;   CORONARY ARTERY BYPASS GRAFT  1990   x 2 at [facility], redo x 3 @ [facility] approx 1998   ESOPHAGOGASTRODUODENOSCOPY (EGD) WITH PROPOFOL N/A 02/08/2022   Procedure: ESOPHAGOGASTRODUODENOSCOPY (EGD) WITH PROPOFOL;  Surgeon: Tressia Danas, MD;  Location: [facility] ENDOSCOPY;  Service: Gastroenterology;  Laterality: N/A;   PERIPHERAL VASCULAR ATHERECTOMY Right 06/04/2019   Procedure: PERIPHERAL VASCULAR ATHERECTOMY;  Surgeon: Runell Gess, MD;  Location: [facility] INVASIVE CV LAB;  Service: Cardiovascular;  Laterality: Right;  common iliac   PERIPHERAL VASCULAR INTERVENTION Right 06/04/2019   Procedure: PERIPHERAL VASCULAR INTERVENTION;  Surgeon: Runell Gess, MD;  Location: [facility] INVASIVE CV LAB;  Service: Cardiovascular;  Laterality: Right;  common iliac   POLYPECTOMY  02/08/2022   Procedure: POLYPECTOMY;  Surgeon: Tressia Danas, MD;  Location: [facility] ENDOSCOPY;  Service: Gastroenterology;;   TEE WITHOUT CARDIOVERSION N/A 08/25/2021   Procedure: TRANSESOPHAGEAL ECHOCARDIOGRAM (TEE);  Surgeon: Chilton Si, MD;  Location: [facility] ENDOSCOPY;  Service: Cardiovascular;  Laterality: N/A;   TEE WITHOUT CARDIOVERSION N/A 11/23/2021   Procedure: TRANSESOPHAGEAL ECHOCARDIOGRAM (TEE);  Surgeon: Elease Hashimoto Deloris Ping, MD;  Location: [facility] ENDOSCOPY;  Service: Cardiovascular;  Laterality: N/A;   Social History   Socioeconomic History   Marital status: Married    Spouse name: Not on file   Number of children: Not on file   Years of education: Not on file   Highest education level: Not on file  Occupational History   Not on file  Tobacco Use   Smoking status:  Every Day    Types: Cigarettes   Smokeless tobacco: Never   Tobacco comments:    8-9 cigarettes per day  Vaping Use   Vaping status: Never Used  Substance and Sexual Activity   Alcohol use: No   Drug use: No   Sexual activity: Not on file  Other Topics Concern   Not on file  Social History Narrative   Not on file   Social Determinants of  Health   Financial Resource Strain: Low Risk  (06/22/2023)   Received from [facility]   Overall Financial Resource Strain (CARDIA)    Difficulty of Paying Living Expenses: Not hard at all  Food Insecurity: No Food Insecurity (06/22/2023)   Received from [facility]   Hunger Vital Sign    Worried About Running Out of Food in the Last Year: Never true    Ran Out of Food in the Last Year: Never true  Transportation Needs: No Transportation Needs (06/22/2023)   Received from [facility] - Transportation    In the past 12 months, has lack of transportation kept you from medical appointments or from getting medications?: No    Lack of Transportation (Non-Medical): No  Physical Activity: Not on file  Stress: Not on file  Social Connections: Not on file   Allergies  Allergen Reactions   Omeprazole     doesn't work well with plavix    Other Palpitations    Certain steroids give patient heart palpitations  Allergy to green peppers   Family History  Problem Relation Age of Onset   Heart disease Mother    Heart disease Father    Diabetes Sister    Prostate cancer Neg Hx    Bladder Cancer Neg Hx    Kidney cancer Neg Hx    Colon cancer Neg Hx    Esophageal cancer Neg Hx     Current Outpatient Medications (Endocrine & Metabolic):    levothyroxine (SYNTHROID) 75 MCG tablet, Take 75 mcg by mouth daily before breakfast.  Current Outpatient Medications (Cardiovascular):    amiodarone (PACERONE) 200 MG tablet, TAKE ONE HALF (1/2) TABLET BY MOUTH DAILY   atorvastatin (LIPITOR) 80 MG tablet, Take 80 mg by mouth daily.   carvedilol (COREG) 3.125 MG tablet, TAKE ONE TABLET BY MOUTH TWICE A DAY   isosorbide mononitrate (IMDUR) 60 MG 24 hr tablet, Take 1.5 tablets (90 mg total) by mouth daily.   nitroGLYCERIN (NITROSTAT) 0.4 MG SL tablet, PLACE ONE TABLET BY MOUTH UNDER THE TONGUE AS NEEDED FOR CHEST PAIN   torsemide (DEMADEX) 20 MG  tablet, Take 1 tablet (20 mg total) by mouth 2 (two) times daily.    Current Outpatient Medications (Hematological):    ELIQUIS 5 MG TABS tablet, TAKE 1 TABLET BY MOUTH TWICE A DAY   vitamin B-12 (CYANOCOBALAMIN) 1000 MCG tablet, Take 1,000 mcg by mouth daily.  Current Outpatient Medications (Other):    Cholecalciferol (VITAMIN D) 50 MCG (2000 UT) CAPS, Take 4,000 Units by mouth daily.   finasteride (PROSCAR) 5 MG tablet, Take 1 tablet (5 mg total) by mouth daily.   gabapentin (NEURONTIN) 300 MG capsule, Take 300 mg by mouth in the morning, at noon, in the evening, and at bedtime.   lansoprazole (PREVACID) 30 MG capsule, TAKE 1 CAPSULE BY MOUTH DAILY   Multiple Vitamin (MULTIVITAMIN) tablet, Take 1 tablet by mouth daily.   potassium chloride (KLOR-CON) 10 MEQ tablet, TAKE 2 TABLETS BY MOUTH TWICE  A DAY   traZODone (DESYREL) 50 MG tablet, Take 1 tablet by mouth at bedtime.   vitamin C (ASCORBIC ACID) 500 MG tablet, Take 500 mg by mouth daily.   Reviewed prior external information including notes and imaging from  primary care provider As well as notes that were available from care everywhere and other healthcare systems.  Past medical history, social, surgical and family history all reviewed in electronic medical record.  No pertanent information unless stated regarding to the chief complaint.   Review of Systems:  No headache, visual changes, nausea, vomiting, diarrhea, constipation, dizziness, abdominal pain, skin rash, fevers, chills, night sweats, weight loss, swollen lymph nodes, body aches, joint swelling, chest pain, shortness of breath, mood changes. POSITIVE muscle aches  Objective  Blood pressure 130/62, pulse (!) 55, height 6' (1.829 m), weight 188 lb (85.3 kg), SpO2 92%.   General: No apparent distress alert and oriented x3 mood and affect normal, dressed appropriately.  HEENT: Pupils equal, extraocular movements intact  Respiratory: Patient's speak in full sentences and  does not appear short of breath  Cardiovascular: No lower extremity edema, non tender, no erythema  Hand exam shows atrophy noted of the thenar eminence.  Patient does have a positive Tinel's over the median nerve.  Low back does have some loss of lordosis noted.  Patient does have tenderness to palpation noted.  Tightness with straight leg test.  Procedure: Real-time Ultrasound Guided Injection of left carpal tunnel Device: GE Logiq Q7 Ultrasound guided injection is preferred based studies that show increased duration, increased effect, greater accuracy, decreased procedural pain, increased response rate with ultrasound guided versus blind injection.  Verbal informed consent obtained.  Time-out conducted.  Noted no overlying erythema, induration, or other signs of local infection.  Skin prepped in a sterile fashion.  Local anesthesia: Topical Ethyl chloride.  With sterile technique and under real time ultrasound guidance:  median nerve visualized.  23g 5/8 inch needle inserted distal to proximal approach into nerve sheath. Pictures taken nfor needle placement. Patient did have injection of 0.5 cc of 0.5% Marcaine, and 0.5 cc of Kenalog 40 mg/dL. Completed without difficulty  Pain immediately resolved suggesting accurate placement of the medication.  Advised to call if fevers/chills, erythema, induration, drainage, or persistent bleeding.  Impression: Technically successful ultrasound guided injection.    Impression and Recommendations:     The above documentation has been reviewed and is accurate and complete Judi Saa, DO

## 2023-06-29 ENCOUNTER — Other Ambulatory Visit: Payer: Self-pay

## 2023-06-29 ENCOUNTER — Ambulatory Visit: Payer: Medicare Other | Admitting: Family Medicine

## 2023-06-29 ENCOUNTER — Encounter: Payer: Self-pay | Admitting: Family Medicine

## 2023-06-29 VITALS — BP 130/62 | HR 55 | Ht 72.0 in | Wt 188.0 lb

## 2023-06-29 DIAGNOSIS — G5602 Carpal tunnel syndrome, left upper limb: Secondary | ICD-10-CM | POA: Diagnosis not present

## 2023-06-29 DIAGNOSIS — M545 Low back pain, unspecified: Secondary | ICD-10-CM

## 2023-06-29 DIAGNOSIS — M48062 Spinal stenosis, lumbar region with neurogenic claudication: Secondary | ICD-10-CM | POA: Diagnosis not present

## 2023-06-29 NOTE — Assessment & Plan Note (Signed)
Repeat injection given today and tolerated the procedure well.  Instructions to avoid any type of surgical intervention.  Hopeful that this will make a difference.  Does have peripheral neuropathy that is also contributing but will need to continue to monitor.  Continue with the B12 injections and over-the-counter medications.  Follow-up with me again in 2 to 3 months.

## 2023-06-29 NOTE — Assessment & Plan Note (Signed)
Responding more appropriately to a nerve root injection at in December.  Will repeat this with patient's radicular symptoms.  Follow-up with me again 6 to 8 weeks after the injection.

## 2023-06-29 NOTE — Patient Instructions (Addendum)
Injection in wrist today [facility] [phone] Call Today  See you again in 2-3 months

## 2023-07-14 ENCOUNTER — Encounter: Payer: Self-pay | Admitting: Family Medicine

## 2023-07-14 ENCOUNTER — Other Ambulatory Visit: Payer: Self-pay | Admitting: Family Medicine

## 2023-07-14 MED ORDER — PREDNISONE 20 MG PO TABS: ORAL_TABLET | ORAL | 0 refills | Status: DC

## 2023-07-15 ENCOUNTER — Other Ambulatory Visit: Payer: Self-pay

## 2023-07-15 MED ORDER — DULOXETINE HCL 20 MG PO CPEP: 20.0000 mg | ORAL_CAPSULE | Freq: Every day | ORAL | 0 refills | Status: DC

## 2023-07-18 ENCOUNTER — Telehealth: Payer: Self-pay | Admitting: *Deleted

## 2023-07-18 ENCOUNTER — Encounter: Payer: Self-pay | Admitting: Physician Assistant

## 2023-07-18 ENCOUNTER — Ambulatory Visit: Payer: Medicare Other | Attending: Physician Assistant | Admitting: Physician Assistant

## 2023-07-18 ENCOUNTER — Other Ambulatory Visit: Payer: Self-pay | Admitting: Cardiovascular Disease

## 2023-07-18 VITALS — BP 138/52 | HR 58 | Ht 73.0 in | Wt 184.4 lb

## 2023-07-18 DIAGNOSIS — I4819 Other persistent atrial fibrillation: Secondary | ICD-10-CM

## 2023-07-18 DIAGNOSIS — Z01810 Encounter for preprocedural cardiovascular examination: Secondary | ICD-10-CM

## 2023-07-18 DIAGNOSIS — E785 Hyperlipidemia, unspecified: Secondary | ICD-10-CM

## 2023-07-18 DIAGNOSIS — I25810 Atherosclerosis of coronary artery bypass graft(s) without angina pectoris: Secondary | ICD-10-CM

## 2023-07-18 DIAGNOSIS — I739 Peripheral vascular disease, unspecified: Secondary | ICD-10-CM

## 2023-07-18 DIAGNOSIS — I10 Essential (primary) hypertension: Secondary | ICD-10-CM

## 2023-07-18 NOTE — Progress Notes (Unsigned)
Cardiology Office Note:  .   Date:  07/19/2023  ID:  ACETON MACEDA, DOB Jun 26, 1946, MRN [id number] PCP: Lauro Regulus, MD  [facility] Providers Cardiologist:  Nanetta Batty, MD Electrophysiologist:  Lanier Prude, MD     History of Present Illness: .   Gregory Shepherd is a 77 y.o. male with a hx of HTN, HLD, tobacco abuse, persistent A-fib, CAD s/p CABG( x2 at [facility] by Dr. Sheppard Penton, redo x3 at [facility]) and PAD.  He had a remote MI at age 31.  He was referred to Dr. Allyson Sabal for evaluation of claudication symptoms.  Lower extremity Doppler performed in March 2020 revealed right ABI 0.6, left ABI 0.8.  He underwent lower extremity angiography in June 2020 revealing 99% calcified ostial to proximal right common iliac artery stenosis, 60 to 70% left common iliac artery stenosis.  He underwent diamondback orbital rotational atherectomy, PTA and stenting with excellent result.  2D echo performed in March 2022 revealed a normal EF, grade 2 DD, normal valve function.  He presented to the hospital in July 2022 with shortness of breath.  The symptom has been worsening laying flat for 2 weeks.  He also endorsed some chest burning sensation.  BNP was 430.  His symptom was attributed to acute on chronic diastolic heart failure and was treated with IV Lasix.  Elevated troponins (22 --> 36) during the hospitalization was attributed to demand ischemia.  On follow-up, he was noted to be volume overloaded and was in persistent atrial fibrillation.  He was started on Eliquis.  He underwent outpatient TEE DCCV in September 2022 which was unsuccessful.  Myoview obtained in October 2022 showed EF 30%, evidence of prior infarction with peri-infarct ischemia.  He was later started on amiodarone and underwent successful cardioversion in December 2022.  More recently, patient underwent repeat Myoview in November 2023 that showed no ischemic changes.  Due to anginal symptom, Imdur was increased  to 90 mg daily in February 2024 during last visit with Dr. Allyson Sabal.  ABI and LEA  obtained on 02/07/2023 showed left ABI 0.97, right ABI 1.0, greater than 50% left common iliac stenosis.  Recommend follow-up study in 12 months.   Patient presents today for follow-up.  Since up titration of Imdur, his nocturnal anginal symptom has significantly improved.  Interestingly, his anginal symptom does not occur during the day when he is walking around.  He experiences angina about once a week and lasting only a few minutes.  He continued to have significant right lower extremity pain.  Recent lower extremity arterial Doppler showed greater than 50% ostial right common iliac artery stenosis and greater than 50% distal left common iliac artery stenosis.  His right lower extremity pain is not worse with ambulation.  It is worse when he is standing on his foot.  He suspect is related to back issue.  He has upcoming back injection.  He is at acceptable risk to proceed from the cardiac perspective.  He will need to hold Eliquis for 3 days prior to the back injection and restart as soon as possible afterward at the surgeon's discretion.  We will see the patient back in 6 months.  ROS:   He denies palpitations, dyspnea, pnd, orthopnea, n, v, dizziness, syncope, edema, weight gain, or early satiety. All other systems reviewed and are otherwise negative except as noted above.   Patient occasionally has nocturnal chest discomfort.  Studies Reviewed: Marland Kitchen   EKG Interpretation Date/Time:  Monday July 18 2023 13:41:07 EDT Ventricular Rate:  58 PR Interval:  256 QRS Duration:  136 QT Interval:  502 QTC Calculation: 492 R Axis:   -48  Text Interpretation: Sinus bradycardia with 1st degree A-V block with occasional Premature ventricular complexes Left axis deviation Right bundle branch block Confirmed by Azalee Course [phone]) on 07/19/2023 9:20:28 PM    Cardiac Studies & Procedures     STRESS TESTS  MYOCARDIAL PERFUSION  IMAGING 10/08/2022  Narrative   Findings are consistent with prior myocardial infarction. The study is intermediate risk.   No ST deviation was noted.   Left ventricular function is abnormal. Global function is mildly reduced. End diastolic cavity size is severely enlarged. End systolic cavity size is moderately enlarged.   Prior study available for comparison from 09/20/2021.  Evidence of prior infarct.  LVEF has improve from prior. Increase risk is related to LVEF and mild (< 10% myocardium) infarct.   ECHOCARDIOGRAM  ECHOCARDIOGRAM COMPLETE 04/13/2022  Narrative ECHOCARDIOGRAM REPORT    Patient Name:   Gregory Shepherd Date of Exam: 04/13/2022 Medical Rec #:  [id number]         Height:       73.0 in Accession #:    [id number]        Weight:       178.0 lb Date of Birth:  08/08/1946         BSA:          2.048 m Patient Age:    75 years          BP:           112/56 mmHg Patient Gender: M                 HR:           54 bpm. Exam Location:  [address]  Procedure: 2D Echo, 3D Echo, Cardiac Doppler, Color Doppler and Strain Analysis  Indications:    I48.19 Atrial fibrillation  History:        Patient has prior history of Echocardiogram examinations, most recent 11/23/2021. Prior CABG, PAD, Arrythmias:Atrial Fibrillation, Signs/Symptoms:Edema; Risk Factors:Hypertension, Diabetes, Dyslipidemia and Current Smoker. Anemia.  Sonographer:    Jorje Guild BS, RDCS Referring Phys: [phone] JONATHAN J BERRY  IMPRESSIONS   1. Possible cherry on top strain pattern; consider evaluation for amyloid. 2. Left ventricular ejection fraction, by estimation, is 45 to 50%. The left ventricle has mildly decreased function. The left ventricle demonstrates global hypokinesis. The left ventricular internal cavity size was mildly dilated. There is mild left ventricular hypertrophy. Left ventricular diastolic parameters are consistent with Grade I diastolic dysfunction (impaired relaxation). 3. Right  ventricular systolic function is normal. The right ventricular size is normal. There is normal pulmonary artery systolic pressure. 4. The mitral valve is normal in structure. Mild mitral valve regurgitation. No evidence of mitral stenosis. 5. The aortic valve is tricuspid. Aortic valve regurgitation is moderate. Aortic valve sclerosis is present, with no evidence of aortic valve stenosis. 6. Aortic dilatation noted. There is borderline dilatation of the aortic root, measuring 39 mm. 7. The inferior vena cava is normal in size with greater than 50% respiratory variability, suggesting right atrial pressure of 3 mmHg.  Comparison(s): TEE 11/23/21 EF 25-30%.  FINDINGS Left Ventricle: Left ventricular ejection fraction, by estimation, is 45 to 50%. The left ventricle has mildly decreased function. The left ventricle demonstrates global hypokinesis. The left ventricular internal cavity size was mildly dilated. There is mild  left ventricular hypertrophy. Left ventricular diastolic parameters are consistent with Grade I diastolic dysfunction (impaired relaxation).  Right Ventricle: The right ventricular size is normal. Right ventricular systolic function is normal. There is normal pulmonary artery systolic pressure. The tricuspid regurgitant velocity is 2.22 m/s, and with an assumed right atrial pressure of 3 mmHg, the estimated right ventricular systolic pressure is 22.7 mmHg.  Left Atrium: Left atrial size was normal in size.  Right Atrium: Right atrial size was normal in size.  Pericardium: There is no evidence of pericardial effusion.  Mitral Valve: The mitral valve is normal in structure. Mild mitral valve regurgitation. No evidence of mitral valve stenosis.  Tricuspid Valve: The tricuspid valve is normal in structure. Tricuspid valve regurgitation is trivial. No evidence of tricuspid stenosis.  Aortic Valve: The aortic valve is tricuspid. Aortic valve regurgitation is moderate. Aortic  regurgitation PHT measures 723 msec. Aortic valve sclerosis is present, with no evidence of aortic valve stenosis.  Pulmonic Valve: The pulmonic valve was normal in structure. Pulmonic valve regurgitation is mild. No evidence of pulmonic stenosis.  Aorta: Aortic dilatation noted. There is borderline dilatation of the aortic root, measuring 39 mm.  Venous: The inferior vena cava is normal in size with greater than 50% respiratory variability, suggesting right atrial pressure of 3 mmHg.  IAS/Shunts: No atrial level shunt detected by color flow Doppler.  Additional Comments: Possible cherry on top strain pattern; consider evaluation for amyloid.   LEFT VENTRICLE PLAX 2D LVIDd:         5.70 cm   Diastology LVIDs:         4.30 cm   LV e' medial:    5.11 cm/s LV PW:         1.30 cm   LV E/e' medial:  10.4 LV IVS:        1.10 cm   LV e' lateral:   6.16 cm/s LVOT diam:     2.40 cm   LV E/e' lateral: 8.7 LV SV:         99 LV SV Index:   48        2D Longitudinal Strain LVOT Area:     4.52 cm  2D Strain GLS (A2C):   -17.3 % 2D Strain GLS (A3C):   -18.7 % 2D Strain GLS (A4C):   -15.4 % 2D Strain GLS Avg:     -17.1 %  3D Volume EF: 3D EF:        45 % LV EDV:       195 ml LV ESV:       108 ml LV SV:        87 ml  RIGHT VENTRICLE            IVC RV Basal diam:  3.40 cm    IVC diam: 1.80 cm RV S prime:     7.22 cm/s TAPSE (M-mode): 2.0 cm RVSP:           22.7 mmHg  LEFT ATRIUM             Index        RIGHT ATRIUM           Index LA diam:        5.20 cm 2.54 cm/m   RA Pressure: 3.00 mmHg LA Vol (A2C):   49.8 ml 24.32 ml/m  RA Area:     18.40 cm LA Vol (A4C):   62.9 ml 30.72 ml/m  RA Volume:   53.40  ml  26.08 ml/m LA Biplane Vol: 56.0 ml 27.35 ml/m AORTIC VALVE LVOT Vmax:   91.50 cm/s LVOT Vmean:  55.000 cm/s LVOT VTI:    0.219 m AI PHT:      723 msec  AORTA Ao Root diam: 3.50 cm Ao Asc diam:  3.90 cm  MITRAL VALVE               TRICUSPID VALVE TR Peak grad:   19.7  mmHg MV Decel Time: 375 msec    TR Vmax:        222.00 cm/s MV E velocity: 53.30 cm/s  Estimated RAP:  3.00 mmHg MV A velocity: 88.20 cm/s  RVSP:           22.7 mmHg MV E/A ratio:  0.60 SHUNTS Systemic VTI:  0.22 m Systemic Diam: 2.40 cm  Olga Millers MD Electronically signed by Olga Millers MD Signature Date/Time: 04/13/2022/2:52:11 PM    Final   TEE  ECHO TEE 11/23/2021  Narrative TRANSESOPHOGEAL ECHO REPORT    Patient Name:   Gregory Shepherd Date of Exam: 11/23/2021 Medical Rec #:  [id number]         Height:       73.0 in Accession #:    [id number]        Weight:       192.9 lb Date of Birth:  Sep 21, 1946         BSA:          2.119 m Patient Age:    75 years          BP:           86/45 mmHg Patient Gender: M                 HR:           56 bpm. Exam Location:  Inpatient  Procedure: Transesophageal Echo and Color Doppler  Indications:     Atrial fibrillation  History:         Patient has prior history of Echocardiogram examinations, most recent 08/25/2021. Previous Myocardial Infarction; Risk Factors:Hypertension and Dyslipidemia.  Sonographer:     Ross Ludwig RDCS (AE) Referring Phys:  [id number] Graciella Freer Diagnosing Phys: Kristeen Miss MD  PROCEDURE: After discussion of the risks and benefits of a TEE, an informed consent was obtained from the patient. The transesophogeal probe was passed without difficulty through the esophogus of the patient. Sedation performed by different physician. The patient was monitored while under deep sedation. Anesthestetic sedation was provided intravenously by Anesthesiology: 102mg  of Propofol, 60mg  of Lidocaine. Image quality was good. The patient developed no complications during the procedure. A successful direct current cardioversion was performed at 200 joules with 1 attempt.  IMPRESSIONS   1. Following the TEE, the patient was successfully cardioverted to sinus rhythm. 2. Left ventricular ejection fraction, by  estimation, is 25 to 30%. The left ventricle has severely decreased function. The left ventricle demonstrates global hypokinesis. The left ventricular internal cavity size was mildly dilated. 3. Right ventricular systolic function is mildly reduced. The right ventricular size is mildly enlarged. 4. No left atrial/left atrial appendage thrombus was detected. 5. The mitral valve is grossly normal. Moderate mitral valve regurgitation. 6. The aortic valve is tricuspid. Aortic valve regurgitation is mild to moderate. No aortic stenosis is present. 7. There is Moderate (Grade III) protruding, multilobular and atheroma plaque involving the descending aorta and aortic arch. 8. No evidence of ASD or PFO by color  Doppler. Bubble study was not performed.  FINDINGS Left Ventricle: Left ventricular ejection fraction, by estimation, is 25 to 30%. The left ventricle has severely decreased function. The left ventricle demonstrates global hypokinesis. The left ventricular internal cavity size was mildly dilated.  Right Ventricle: The right ventricular size is mildly enlarged. Right vetricular wall thickness was not well visualized. Right ventricular systolic function is mildly reduced.  Left Atrium: Left atrial size was normal in size. No left atrial/left atrial appendage thrombus was detected.  Right Atrium: Right atrial size was normal in size.  Pericardium: There is no evidence of pericardial effusion.  Mitral Valve: The mitral valve is grossly normal. Moderate mitral valve regurgitation.  Tricuspid Valve: The tricuspid valve is grossly normal. Tricuspid valve regurgitation is trivial.  Aortic Valve: The aortic valve is tricuspid. Aortic valve regurgitation is mild to moderate. No aortic stenosis is present.  Pulmonic Valve: The pulmonic valve was not well visualized. Pulmonic valve regurgitation is trivial.  Aorta: The aortic root and ascending aorta are structurally normal, with no evidence of  dilitation. There is moderate (Grade III) protruding, multilobular and atheroma plaque involving the descending aorta and aortic arch.  IAS/Shunts: No atrial level shunt detected by color flow Doppler. No evidence of ASD or PFO by color Doppler. Bubble study was not performed.  Kristeen Miss MD Electronically signed by Kristeen Miss MD Signature Date/Time: 11/23/2021/10:14:30 AM    Final   MONITORS  LONG TERM MONITOR (3-14 DAYS) 05/07/2022  Narrative Patch Wear Time:  13 days and 2 hours (2023-05-13T15:24:45-0400 to 2023-05-26T18:22:08-399)  Patient had a min HR of 44 bpm, max HR of 162 bpm, and avg HR of 55 bpm. Predominant underlying rhythm was Sinus Rhythm. First Degree AV Block was present. 2 Ventricular Tachycardia runs occurred, the run with the fastest interval lasting 12 beats with a max rate of 162 bpm, the longest lasting 14 beats with an avg rate of 120 bpm. 15 Supraventricular Tachycardia runs occurred, the run with the fastest interval lasting 5 beats with a max rate of 128 bpm, the longest lasting 9 beats with an avg rate of 114 bpm. Some episodes of Supraventricular Tachycardia may be possible Atrial Tachycardia with variable block. Isolated SVEs were occasional (2.9%, 30131), SVE Couplets were rare (<1.0%, 361), and SVE Triplets were rare (<1.0%, 60). Isolated VEs were rare (<1.0%, 6947), VE Couplets were rare (<1.0%, 126), and VE Triplets were rare (<1.0%, 4). Ventricular Bigeminy and Trigeminy were present.  1. SR/SB/ST 2. Occasional PACs, more freq PVCs 3. Short runs of SVT and NSVT    CARDIAC MRI  MR CARDIAC MORPHOLOGY W WO CONTRAST 10/27/2022  Narrative CLINICAL DATA:  27M with CAD s/p CABG, chronic combined heart failure (EF 45-50%). Echo with strain pattern concerning for amyloid  EXAM: CARDIAC MRI  TECHNIQUE: The patient was scanned on a 1.5 Tesla Siemens magnet. A dedicated cardiac coil was used. Functional imaging was done using Fiesta sequences.  2,3, and 4 chamber views were done to assess for RWMA's. Modified Simpson's rule using a short axis stack was used to calculate an ejection fraction on a dedicated work [employer]. The patient received 10 cc of Gadavist. After 10 minutes inversion recovery sequences were used to assess for infiltration and scar tissue. Phase contrast velocity mapping was performed above the aortic and pulmonic valves  CONTRAST:  10 cc  of Gadavist  FINDINGS: Left ventricle:  -Moderate asymmetric hypertrophy (measures 14mm in basal septum, 8mm in posterior wall)  -Moderate dilatation  -  Mild systolic dysfunction  -Normal ECV (28%)  -RV insertion site LGE  LV EF: 44% (Normal 49-79%)  Absolute volumes:  LV EDV: (Normal 95-215 mL)  LV ESV: (Normal 25-85 mL)  LV SV: (Normal 61-145 mL)  CO: 5.9L/min (Normal 3.4-7.8 L/min)  Indexed volumes:  LV EDV: 147mL/sq-m (Normal 50-108 mL/sq-m)  LV ESV: 10mL/sq-m (Normal 11-47 mL/sq-m)  LV SV: 19mL/sq-m (Normal 33-72 mL/sq-m)  CI: 2.8L/min/sq-m (Normal 1.8-4.2 L/min/sq-m)  Right ventricle: Normal size and systolic function  RV EF:  53% (Normal 51-80%)  Absolute volumes:  RV EDV: (Normal 109-217 mL)  RV ESV: 84mL (Normal 23-91 mL)  RV SV: 96mL (Normal 71-141 mL)  CO: 5.0L/min (Normal 2.8-8.8 L/min)  Indexed volumes:  RV EDV: 41mL/sq-m (Normal 58-109 mL/sq-m)  RV ESV: 83mL/sq-m (Normal 12-46 mL/sq-m)  RV SV: 52mL/sq-m (Normal 38-71 mL/sq-m)  CI: 2.4L/min/sq-m (Normal 1.7-4.2 L/min/sq-m)  Left atrium: Moderate enlargement  Right atrium: Mild enlargement  Mitral valve: Mild regurgitation (regurgitant fraction 12%)  Aortic valve: Moderate regurgitation (regurgitant fraction 28%)  Tricuspid valve: Trivial regurgitation  Pulmonic valve: No regurgitation  Aorta: Dilated ascending aorta measuring 40mm  Pericardium: Normal  Extracardiac structures: No significant  findings  IMPRESSION: 1.  No evidence of cardiac amyloidosis  2.  Moderate LV dilatation and mild systolic dysfunction (EF 44%)  3. Moderate asymmetric LV hypertrophy measuring 14mm in septum (8mm in posterior wall), not meeting criteria for hypertrophic cardiomyopathy (<72mm)  4.  Normal RV size and systolic function (EF 53%)  5. RV insertion site late gadolinium enhancement, which is a nonspecific scar pattern often seen in setting of elevated pulmonary pressures  6.  Moderate aortic regurgitation (regurgitant fraction 28%)  7.  Mild mitral regurgitation (regurgitant fraction 12%)  8.  Dilated ascending aorta measuring 40mm   Electronically Signed By: Epifanio Lesches M.D. On: 10/27/2022 21:56         Risk Assessment/Calculations:    CHA2DS2-VASc Score = 4   This indicates a 4.8% annual risk of stroke. The patient's score is based upon: CHF History: 0 HTN History: 1 Diabetes History: 0 Stroke History: 0 Vascular Disease History: 1 Age Score: 2 Gender Score: 0            Physical Exam:   VS:  BP (!) 138/52 (BP Location: Left Arm, Patient Position: Sitting, Cuff Size: Normal)   Pulse (!) 58   Ht 6\' 1"  (1.854 m)   Wt 184 lb 6.4 oz (83.6 kg)   SpO2 95%   BMI 24.33 kg/m    Wt Readings from Last 3 Encounters:  07/18/23 184 lb 6.4 oz (83.6 kg)  06/29/23 188 lb (85.3 kg)  01/11/23 185 lb 9.6 oz (84.2 kg)    GEN: Well nourished, well developed in no acute distress NECK: No JVD; No carotid bruits CARDIAC: RRR, no murmurs, rubs, gallops RESPIRATORY:  Clear to auscultation without rales, wheezing or rhonchi  ABDOMEN: Soft, non-tender, non-distended EXTREMITIES:  No edema; No deformity   ASSESSMENT AND PLAN: .    Preoperative clearance: Upcoming back injection.  Patient will need to hold Eliquis for 3 days prior to the procedure and restart as soon as possible afterward at the surgeon's discretion  Persistent atrial fibrillation: On amiodarone therapy.   Maintaining sinus rhythm.  Continue Eliquis  CAD s/p CABG: He does not have any exertional chest pain.  He has a history of nocturnal chest discomfort that is improved after up titration of Imdur to 90 mg daily  Hypertension: Blood  pressure stable  Hyperlipidemia: On Lipitor  PAD: No significant claudication symptom.       Dispo: Follow-up in 65-month.  Signed, Azalee Course, PA

## 2023-07-18 NOTE — Telephone Encounter (Signed)
Prescription refill request for Eliquis received. Indication: Afib  Last office visit: 01/11/23 Allyson Sabal)  Scr: 1.1 (06/15/23)  Age: 77 Weight: 85.3kg  Appropriate dose. Refill sent

## 2023-07-18 NOTE — Patient Instructions (Signed)
Medication Instructions:  HOLD ELIQUIS FOR 3 DAYS PRIOR TO BACK INJECTION. *If you need a refill on your cardiac medications before your next appointment, please call your pharmacy*   Lab Work: NO LABS If you have labs (blood work) drawn today and your tests are completely normal, you will receive your results only by: MyChart Message (if you have MyChart) OR A paper copy in the mail If you have any lab test that is abnormal or we need to change your treatment, we will call you to review the results.   Testing/Procedures: NO TESTING   Follow-Up: At [facility], you and your health needs are our priority.  As part of our continuing mission to provide you with exceptional heart care, we have created designated Provider Care Teams.  These Care Teams include your primary Cardiologist (physician) and Advanced Practice Providers (APPs -  Physician Assistants and Nurse Practitioners) who all work together to provide you with the care you need, when you need it.  We recommend signing up for the patient portal called "MyChart".  Sign up information is provided on this After Visit Summary.  MyChart is used to connect with patients for Virtual Visits (Telemedicine).  Patients are able to view lab/test results, encounter notes, upcoming appointments, etc.  Non-urgent messages can be sent to your provider as well.   To learn more about what you can do with MyChart, go to [email or web address].    Your next appointment:   6 month(s)  Provider:   Nanetta Batty, MD

## 2023-07-18 NOTE — Telephone Encounter (Signed)
   Pre-operative Risk Assessment    Patient Name: Gregory Shepherd  DOB: October 30, 1946 MRN: [id number]      Request for Surgical Clearance    Procedure:   NERVE ROOT INJECTION/LUMBAR  Date of Surgery:  Clearance TBD                                 Surgeon:  NOT LISTED Surgeon's Group or Practice Name:  [facility] Phone number:  [phone] Fax number:  [phone]   Type of Clearance Requested:   - Medical  - Pharmacy:  Hold Apixaban (Eliquis)     Type of Anesthesia:  Local    Additional requests/questions:    Elpidio Anis   07/18/2023, 1:37 PM

## 2023-07-19 NOTE — Telephone Encounter (Signed)
   Patient Name: Gregory Shepherd  DOB: 10-12-1946 MRN: [id number]  Primary Cardiologist: Nanetta Batty, MD  Chart reviewed as part of pre-operative protocol coverage. Given past medical history and time since last visit, based on ACC/AHA guidelines, Gregory Shepherd is at acceptable risk for the planned procedure without further cardiovascular testing.   The case was reviewed with out clinical pharmacist, patient will need to hold Eliquis for 3 days prior to the lumbar injection and resume as soon as possible afterward at the surgeon's discretion.  The patient was advised that if he develops new symptoms prior to surgery to contact our office to arrange for a follow-up visit, and he verbalized understanding.  I will route this recommendation to the requesting party via Epic fax function and remove from pre-op pool.  Please call with questions.  [place], [place] 07/19/2023, 8:13 AM

## 2023-07-24 NOTE — Progress Notes (Unsigned)
  Electrophysiology Office Note:   Date:  07/25/2023  ID:  Gregory Shepherd, Gregory Shepherd, MRN [id number]  Primary Cardiologist: Nanetta Batty, MD Electrophysiologist: Lanier Prude, MD      History of Present Illness:   Gregory Shepherd is a 77 y.o. male with h/o chronic systolic CHF, persistent AF, and CAD s/p CABG seen today for routine electrophysiology followup.   Since last being seen in our clinic the patient reports doing well overall from an arrhyhtmia perspective. Denies palpitations, syncope, or lightheadedness. Has chronic, sharp chest pain that sometimes wakes him up at night. Improved significantly with increase in imdur.  Does not have much energy, but has not been very active. Awaiting back injection and hopeful he will be able to get some stamina back.   Review of systems complete and found to be negative unless listed in HPI.   EP Information / Studies Reviewed:    EKG is not ordered today. EKG from 07/18/2023 reviewed which showed sinus bradycardia at 58 bpm      Physical Exam:   VS:  BP (!) 124/58   Pulse (!) 55   Ht 6\' 1"  (1.854 m)   Wt 182 lb 3.2 oz (82.6 kg)   SpO2 99%   BMI 24.04 kg/m    Wt Readings from Last 3 Encounters:  07/25/23 182 lb 3.2 oz (82.6 kg)  07/18/23 184 lb 6.4 oz (83.6 kg)  06/29/23 188 lb (85.3 kg)     GEN: Well nourished, well developed in no acute distress NECK: No JVD; No carotid bruits CARDIAC: Regular rate and rhythm, no murmurs, rubs, gallops RESPIRATORY:  Clear to auscultation without rales, wheezing or rhonchi  ABDOMEN: Soft, non-tender, non-distended EXTREMITIES:  No edema; No deformity   ASSESSMENT AND PLAN:    Persistent atrial fibrillation EKG last week showed NSR Continue amiodarone Continue eliquis, OK to hold for back injection  CAD s/p CABG Chronic nocturnal chest discomfort that has improved significantly on imdur increase, but still wakes him up several times a week.  Continue imdur 90 mg  daily  Chronic systolic CHF EF44% by cMRI 10/2022. Volume status stable on exam. Continue torsemide  HTN Stable on current regimen   PAD No significant symptoms of clauidication  Follow up with Dr. Lalla Brothers in 6 months  Signed, Graciella Freer, PA-C

## 2023-07-25 ENCOUNTER — Encounter: Payer: Self-pay | Admitting: Student

## 2023-07-25 ENCOUNTER — Ambulatory Visit: Payer: Medicare Other | Attending: Student | Admitting: Student

## 2023-07-25 VITALS — BP 124/58 | HR 55 | Ht 73.0 in | Wt 182.2 lb

## 2023-07-25 DIAGNOSIS — I4819 Other persistent atrial fibrillation: Secondary | ICD-10-CM

## 2023-07-25 DIAGNOSIS — I25810 Atherosclerosis of coronary artery bypass graft(s) without angina pectoris: Secondary | ICD-10-CM

## 2023-07-25 DIAGNOSIS — I10 Essential (primary) hypertension: Secondary | ICD-10-CM

## 2023-07-25 NOTE — Patient Instructions (Addendum)
Medication Instructions:  Your physician recommends that you continue on your current medications as directed. Please refer to the Current Medication list given to you today.  *If you need a refill on your cardiac medications before your next appointment, please call your pharmacy*  Lab Work: None ordered If you have labs (blood work) drawn today and your tests are completely normal, you will receive your results only by: MyChart Message (if you have MyChart) OR A paper copy in the mail If you have any lab test that is abnormal or we need to change your treatment, we will call you to review the results.  Follow-Up: At [facility], you and your health needs are our priority.  As part of our continuing mission to provide you with exceptional heart care, we have created designated Provider Care Teams.  These Care Teams include your primary Cardiologist (physician) and Advanced Practice Providers (APPs -  Physician Assistants and Nurse Practitioners) who all work together to provide you with the care you need, when you need it.  Your next appointment:   6 month(s)  Provider:   Cameron Lambert, MD  

## 2023-07-26 ENCOUNTER — Ambulatory Visit
Admission: RE | Admit: 2023-07-26 | Discharge: 2023-07-26 | Disposition: A | Discharge status: Home / Self Care | Payer: Medicare Other | Source: Ambulatory Visit | Attending: Family Medicine | Admitting: Family Medicine

## 2023-07-26 DIAGNOSIS — M545 Low back pain, unspecified: Secondary | ICD-10-CM

## 2023-07-26 MED ORDER — IOPAMIDOL (ISOVUE-M 200) INJECTION 41%
1.0000 mL | Freq: Once | INTRAMUSCULAR | Status: AC
  Administered 2023-07-26: 1 mL via EPIDURAL

## 2023-07-26 MED ORDER — METHYLPREDNISOLONE ACETATE 40 MG/ML INJ SUSP (RADIOLOG
80.0000 mg | Freq: Once | INTRAMUSCULAR | Status: AC
  Administered 2023-07-26: 80 mg via EPIDURAL

## 2023-07-26 NOTE — Discharge Instructions (Signed)
 Post Procedure Spinal Discharge Instruction Sheet  You may resume a regular diet and any medications that you routinely take (including pain medications) unless otherwise noted by MD.  No driving day of procedure.  Light activity throughout the rest of the day.  Do not do any strenuous work, exercise, bending or lifting.  The day following the procedure, you can resume normal physical activity but you should refrain from exercising or physical therapy for at least three days thereafter.  You may apply ice to the injection site, 20 minutes on, 20 minutes off, as needed. Do not apply ice directly to skin.    Common Side Effects:  Headaches- take your usual medications as directed by your physician.  Increase your fluid intake.  Caffeinated beverages may be helpful.  Lie flat in bed until your headache resolves.  Restlessness or inability to sleep- you may have trouble sleeping for the next few days.  Ask your referring physician if you need any medication for sleep.  Facial flushing or redness- should subside within a few days.  Increased pain- a temporary increase in pain a day or two following your procedure is not unusual.  Take your pain medication as prescribed by your referring physician.  Leg cramps  Please contact our office at [phone] for the following symptoms: Fever greater than 100 degrees. Headaches unresolved with medication after 2-3 days. Increased swelling, pain, or redness at injection site.  YOU MAY RESUME YOUR ELIQUIS 24 HOURS POST PROCEDURE.  Thank you for visiting [facility] today.

## 2023-08-02 ENCOUNTER — Other Ambulatory Visit: Payer: Self-pay | Admitting: Family Medicine

## 2023-08-02 ENCOUNTER — Other Ambulatory Visit: Payer: Self-pay | Admitting: Cardiology

## 2023-08-02 DIAGNOSIS — R072 Precordial pain: Secondary | ICD-10-CM

## 2023-08-30 NOTE — Progress Notes (Unsigned)
[name] [address] [place] [address] Phone: [phone] Subjective:   [name], am serving as a scribe for Dr. Antoine Primas.  I'm seeing this patient by the request  of:  Lauro Regulus, MD  CC: Wrist pain and polyarthralgia  [id number]  06/29/2023 Responding more appropriately to a nerve root injection at in December.  Will repeat this with patient's radicular symptoms.  Follow-up with me again 6 to 8 weeks after the injection.    Repeat injection given today and tolerated the procedure well. Instructions to avoid any type of surgical intervention. Hopeful that this will make a difference. Does have peripheral neuropathy that is also contributing but will need to continue to monitor. Continue with the B12 injections and over-the-counter medications. Follow-up with me again in 2 to 3 months.   Updated 08/31/2023 Gregory Shepherd is a 77 y.o. male coming in with complaint of wrist pain and polyarthralgia. R leg is a little better Pain is different. Can feel foot. Hand is getting worse. Need referral.  Was started on Cymbalta.  Patient should have been on it for about a month.  No side effects     Past Medical History:  Diagnosis Date   Carotid artery narrowing    History of blood clots    History of colon polyps    Hyperlipidemia    Hypertension    MI (myocardial infarction) [facility])    Past Surgical History:  Procedure Laterality Date   ABDOMINAL AORTOGRAM W/LOWER EXTREMITY N/A 06/04/2019   Procedure: ABDOMINAL AORTOGRAM W/LOWER EXTREMITY;  Surgeon: Runell Gess, MD;  Location: [facility] INVASIVE CV LAB;  Service: Cardiovascular;  Laterality: N/A;   BIOPSY  02/08/2022   Procedure: BIOPSY;  Surgeon: Tressia Danas, MD;  Location: [facility] ENDOSCOPY;  Service: Gastroenterology;;   CARDIAC SURGERY     x2    CARDIOVERSION N/A 08/25/2021   Procedure: CARDIOVERSION;  Surgeon: Chilton Si, MD;  Location: [facility] ENDOSCOPY;  Service:  Cardiovascular;  Laterality: N/A;   CARDIOVERSION N/A 11/23/2021   Procedure: CARDIOVERSION;  Surgeon: Vesta Mixer, MD;  Location: [facility] ENDOSCOPY;  Service: Cardiovascular;  Laterality: N/A;   COLONOSCOPY     Age 29. [facility]   COLONOSCOPY WITH PROPOFOL N/A 02/08/2022   Procedure: COLONOSCOPY WITH PROPOFOL;  Surgeon: Tressia Danas, MD;  Location: [facility] ENDOSCOPY;  Service: Gastroenterology;  Laterality: N/A;   CORONARY ARTERY BYPASS GRAFT  1990   x 2 at [facility], redo x 3 @ [facility] approx 1998   ESOPHAGOGASTRODUODENOSCOPY (EGD) WITH PROPOFOL N/A 02/08/2022   Procedure: ESOPHAGOGASTRODUODENOSCOPY (EGD) WITH PROPOFOL;  Surgeon: Tressia Danas, MD;  Location: [facility] ENDOSCOPY;  Service: Gastroenterology;  Laterality: N/A;   PERIPHERAL VASCULAR ATHERECTOMY Right 06/04/2019   Procedure: PERIPHERAL VASCULAR ATHERECTOMY;  Surgeon: Runell Gess, MD;  Location: [facility] INVASIVE CV LAB;  Service: Cardiovascular;  Laterality: Right;  common iliac   PERIPHERAL VASCULAR INTERVENTION Right 06/04/2019   Procedure: PERIPHERAL VASCULAR INTERVENTION;  Surgeon: Runell Gess, MD;  Location: [facility] INVASIVE CV LAB;  Service: Cardiovascular;  Laterality: Right;  common iliac   POLYPECTOMY  02/08/2022   Procedure: POLYPECTOMY;  Surgeon: Tressia Danas, MD;  Location: [facility] ENDOSCOPY;  Service: Gastroenterology;;   TEE WITHOUT CARDIOVERSION N/A 08/25/2021   Procedure: TRANSESOPHAGEAL ECHOCARDIOGRAM (TEE);  Surgeon: Chilton Si, MD;  Location: [facility] ENDOSCOPY;  Service: Cardiovascular;  Laterality: N/A;   TEE WITHOUT CARDIOVERSION N/A 11/23/2021   Procedure: TRANSESOPHAGEAL ECHOCARDIOGRAM (TEE);  Surgeon: Vesta Mixer, MD;  Location: [facility] ENDOSCOPY;  Service: Cardiovascular;  Laterality: N/A;   Social History   Socioeconomic History   Marital status: Married    Spouse name: Not on file   Number of children: Not on file   Years of education: Not on file   Highest education level: Not on file   Occupational History   Not on file  Tobacco Use   Smoking status: Every Day    Types: Cigarettes   Smokeless tobacco: Never   Tobacco comments:    8-9 cigarettes per day  Vaping Use   Vaping status: Never Used  Substance and Sexual Activity   Alcohol use: No   Drug use: No   Sexual activity: Not on file  Other Topics Concern   Not on file  Social History Narrative   Not on file   Social Determinants of Health   Financial Resource Strain: Low Risk  (06/22/2023)   Received [facility]   Overall Financial Resource Strain (CARDIA)    Difficulty of Paying Living Expenses: Not hard at all  Food Insecurity: No Food Insecurity (06/22/2023)   Received from [facility]   Hunger Vital Sign    Worried About Running Out of Food in the Last Year: Never true    Ran Out of Food in the Last Year: Never true  Transportation Needs: No Transportation Needs (06/22/2023)   Received from [facility] - Transportation    In the past 12 months, has lack of transportation kept you from medical appointments or from getting medications?: No    Lack of Transportation (Non-Medical): No  Physical Activity: Not on file  Stress: Not on file  Social Connections: Not on file   Allergies  Allergen Reactions   Omeprazole     doesn't work well with plavix    Other Palpitations    Certain steroids give patient heart palpitations  Allergy to green peppers   Family History  Problem Relation Age of Onset   Heart disease Mother    Heart disease Father    Diabetes Sister    Prostate cancer Neg Hx    Bladder Cancer Neg Hx    Kidney cancer Neg Hx    Colon cancer Neg Hx    Esophageal cancer Neg Hx     Current Outpatient Medications (Endocrine & Metabolic):    levothyroxine (SYNTHROID) 75 MCG tablet, Take 75 mcg by mouth daily before breakfast.   predniSONE (DELTASONE) 20 MG tablet, Take one tablet daily for the next 5 days. (Patient not  taking: Reported on 07/25/2023)  Current Outpatient Medications (Cardiovascular):    amiodarone (PACERONE) 200 MG tablet, TAKE ONE HALF (1/2) TABLET BY MOUTH DAILY   atorvastatin (LIPITOR) 80 MG tablet, Take 80 mg by mouth daily.   carvedilol (COREG) 3.125 MG tablet, TAKE ONE TABLET BY MOUTH TWICE A DAY   isosorbide mononitrate (IMDUR) 60 MG 24 hr tablet, Take 1.5 tablets (90 mg total) by mouth daily.   nitroGLYCERIN (NITROSTAT) 0.4 MG SL tablet, PLACE ONE TABLET BY MOUTH UNDER THE TONGUE AS NEEDED FOR CHEST PAIN   torsemide (DEMADEX) 20 MG tablet, Take 1 tablet (20 mg total) by mouth 2 (two) times daily.    Current Outpatient Medications (Hematological):    ELIQUIS 5 MG TABS tablet, TAKE ONE TABLET BY MOUTH TWICE A DAY   vitamin B-12 (CYANOCOBALAMIN) 1000 MCG tablet, Take 1,000 mcg by mouth daily.  Current Outpatient Medications (Other):    Cholecalciferol (  VITAMIN D) 50 MCG (2000 UT) CAPS, Take 4,000 Units by mouth daily.   DULoxetine (CYMBALTA) 20 MG capsule, Take 1 capsule (20 mg total) by mouth daily.   finasteride (PROSCAR) 5 MG tablet, Take 1 tablet (5 mg total) by mouth daily.   gabapentin (NEURONTIN) 300 MG capsule, Take 300 mg by mouth in the morning, at noon, in the evening, and at bedtime.   lansoprazole (PREVACID) 30 MG capsule, TAKE 1 CAPSULE BY MOUTH DAILY   Multiple Vitamin (MULTIVITAMIN) tablet, Take 1 tablet by mouth daily.   potassium chloride (KLOR-CON) 10 MEQ tablet, TAKE TWO TABLETS BY MOUTH TWICE A DAY   traZODone (DESYREL) 50 MG tablet, Take 1 tablet by mouth at bedtime.   vitamin C (ASCORBIC ACID) 500 MG tablet, Take 500 mg by mouth daily.    Objective  Blood pressure 134/74, pulse (!) 58, height 6\' 1"  (1.854 m), weight 184 lb (83.5 kg), SpO2 96%.   General: No apparent distress alert and oriented x3 mood and affect normal, dressed appropriately.  HEENT: Pupils equal, extraocular movements intact  Respiratory: Patient's speak in full sentences and does not  appear short of breath  Cardiovascular: No lower extremity edema, non tender, no erythema  Patient's hand does have weakness noted.  He does have some atrophy of the thenar eminence noted.  Stable weakness that he has had for quite some time of the hand.  Ambulates with the aid of a cane.  Does have a very mild wide-based gait but is able to have relatively good balance at the moment.  Still has some numbness in the right lower extremity    Impression and Recommendations:     The above documentation has been reviewed and is accurate and complete Judi Saa, DO

## 2023-08-31 ENCOUNTER — Other Ambulatory Visit: Payer: Self-pay | Admitting: Family Medicine

## 2023-08-31 ENCOUNTER — Ambulatory Visit: Payer: Medicare Other | Admitting: Family Medicine

## 2023-08-31 ENCOUNTER — Encounter: Payer: Self-pay | Admitting: Family Medicine

## 2023-08-31 VITALS — BP 134/74 | HR 58 | Ht 73.0 in | Wt 184.0 lb

## 2023-08-31 DIAGNOSIS — M48062 Spinal stenosis, lumbar region with neurogenic claudication: Secondary | ICD-10-CM

## 2023-08-31 DIAGNOSIS — G5602 Carpal tunnel syndrome, left upper limb: Secondary | ICD-10-CM

## 2023-08-31 MED ORDER — DULOXETINE HCL 20 MG PO CPEP: 20.0000 mg | ORAL_CAPSULE | Freq: Every day | ORAL | 3 refills | Status: DC

## 2023-08-31 NOTE — Assessment & Plan Note (Signed)
Patient is no longer responding to the injections.  Referred to hand surgery to discuss options

## 2023-08-31 NOTE — Patient Instructions (Signed)
Cymbalta refilled See you again in 2 months

## 2023-08-31 NOTE — Assessment & Plan Note (Signed)
Seems stable at the moment.  Discussed the potential need for epidurals if needed.  Patient wants to continue to see how the Cymbalta does at this moment.  Encouraged to be active when possible.

## 2023-09-01 ENCOUNTER — Other Ambulatory Visit: Payer: Self-pay | Admitting: Nurse Practitioner

## 2023-09-08 ENCOUNTER — Telehealth: Payer: Self-pay | Admitting: Family Medicine

## 2023-09-08 NOTE — Telephone Encounter (Signed)
Pt wife called, pt seeing Dr. Amanda Pea 10/26/2023. Per her request, faxed recent OV's and NCV/EMG to Gramig's office.

## 2023-09-12 ENCOUNTER — Other Ambulatory Visit (INDEPENDENT_AMBULATORY_CARE_PROVIDER_SITE_OTHER): Payer: Medicare Other

## 2023-09-12 ENCOUNTER — Ambulatory Visit: Payer: Medicare Other | Admitting: Gastroenterology

## 2023-09-12 ENCOUNTER — Encounter: Payer: Self-pay | Admitting: Gastroenterology

## 2023-09-12 VITALS — BP 118/62 | HR 57 | Ht 72.0 in | Wt 183.0 lb

## 2023-09-12 DIAGNOSIS — Z8639 Personal history of other endocrine, nutritional and metabolic disease: Secondary | ICD-10-CM

## 2023-09-12 DIAGNOSIS — Z79899 Other long term (current) drug therapy: Secondary | ICD-10-CM

## 2023-09-12 DIAGNOSIS — Z860100 Personal history of colon polyps, unspecified: Secondary | ICD-10-CM

## 2023-09-12 DIAGNOSIS — R194 Change in bowel habit: Secondary | ICD-10-CM

## 2023-09-12 DIAGNOSIS — K219 Gastro-esophageal reflux disease without esophagitis: Secondary | ICD-10-CM

## 2023-09-12 LAB — CBC WITH DIFFERENTIAL/PLATELET
Basophils Absolute: 0.1 10*3/uL (ref 0.0–0.1)
Basophils Relative: 1.2 % (ref 0.0–3.0)
Eosinophils Absolute: 0.2 10*3/uL (ref 0.0–0.7)
Eosinophils Relative: 2.3 % (ref 0.0–5.0)
HCT: 36 % — ABNORMAL LOW (ref 39.0–52.0)
Hemoglobin: 11.8 g/dL — ABNORMAL LOW (ref 13.0–17.0)
Lymphocytes Relative: 11.3 % — ABNORMAL LOW (ref 12.0–46.0)
Lymphs Abs: 0.9 10*3/uL (ref 0.7–4.0)
MCHC: 32.8 g/dL (ref 30.0–36.0)
MCV: 85.3 fL (ref 78.0–100.0)
Monocytes Absolute: 0.7 10*3/uL (ref 0.1–1.0)
Monocytes Relative: 8.5 % (ref 3.0–12.0)
Neutro Abs: 6 10*3/uL (ref 1.4–7.7)
Neutrophils Relative %: 76.7 % (ref 43.0–77.0)
Platelets: 210 10*3/uL (ref 150.0–400.0)
RBC: 4.22 Mil/uL (ref 4.22–5.81)
RDW: 17.6 % — ABNORMAL HIGH (ref 11.5–15.5)
WBC: 7.9 10*3/uL (ref 4.0–10.5)

## 2023-09-12 LAB — IBC + FERRITIN
Ferritin: 16.2 ng/mL — ABNORMAL LOW (ref 22.0–322.0)
Iron: 57 ug/dL (ref 42–165)
Saturation Ratios: 18 % — ABNORMAL LOW (ref 20.0–50.0)
TIBC: 316.4 ug/dL (ref 250.0–450.0)
Transferrin: 226 mg/dL (ref 212.0–360.0)

## 2023-09-12 MED ORDER — LANSOPRAZOLE 30 MG PO CPDR: DELAYED_RELEASE_CAPSULE | ORAL | 3 refills | Status: DC

## 2023-09-12 MED ORDER — CITRUCEL PO POWD: 1.0000 | Freq: Every day | ORAL | Status: DC

## 2023-09-12 NOTE — Progress Notes (Signed)
HPI :  77 year old male here for a follow-up visit for GERD and iron deficiency anemia, history of colon polyps.  Last seen in March 2023, previously followed by Dr. Orvan Falconer this is my first time seeing him.  Recall he has a complicated medical history to include CAD status post CABG, A-fib, chronic anticoagulation on Eliquis, history of iron deficiency anemia.  He had an EGD and colonoscopy with Dr. Orvan Falconer in March 2023.  No high risk or concerning pathology noted to cause iron deficiency.  He declined capsule endoscopy at the time.  Generally he is feeling pretty well today.  He has been on Prevacid 30 mg daily for the past year.  He states for the past year his reflux was bothering him more, had a lot of heartburn and indigestion.  He states that the Prevacid has been "fantastic" and completely relieved his symptoms.  He takes it every day and has no symptoms that bother him.  He denies any breakthrough GERD, no dysphagia.  He does have dental implants and does not eat solid foods like he used to but generally tolerating p.o. pretty well.  He denies any nausea or vomiting.  No postprandial abdominal pains.  He remains on Eliquis but off iron at this point.  He denies any blood in his stools.  His last blood work to check his blood counts and iron studies was back in November 2023.  He states his stool form is very soft and sometimes "sticky" and hard to relieve himself completely and has a difficult time wiping himself.  Denies any black or tarry stools.  No red blood in the stool.  He typically has 1 bowel movement per day.  Denies any new medication changes.  Denies any family history of colon cancer.  He had a few small polyps removed from his colon at the last exam, no high risk lesions  EGD 02/08/2022: - Normal esophagus. - Nodular mucosa in the gastric fundus and in the gastric body. Biopsied. - Small hiatal hernia. - Normal examined duodenum. Biopsied. - The examination was otherwise  normal.   Colonoscopy 02/08/2022: - Non-bleeding external and internal hemorrhoids. - Diverticulosis in the sigmoid colon and in the descending colon. - One 3 mm polyp in the transverse colon, removed with a cold snare. Resected and retrieved. - One 5 mm polyp in the ascending colon, removed piecemeal using a cold snare. Resected and retrieved. - The examination was otherwise normal on direct and retroflexion views  FINAL MICROSCOPIC DIAGNOSIS:   A. DUODENUM, BIOPSY:  - Duodenal mucosa with intact villous architecture.  - No villous atrophy or increased intraepithelial lymphocytes.   B. STOMACH, ANTRUM, BODY, FUNDUS, BIOPSY:  - Antral and oxyntic mucosa with mild chronic gastritis and changes  consistent with reactive gastropathy.  - Immunohistochemistry for Helicobacter pylori negative.   C. COLON, TRANSVERSE, ASCENDING, BIOPSY:  - Tubular adenoma (s) without high grade dysplasia.   Past Medical History:  Diagnosis Date   Carotid artery narrowing    History of blood clots    History of colon polyps    Hyperlipidemia    Hypertension    MI (myocardial infarction) [facility])      Past Surgical History:  Procedure Laterality Date   ABDOMINAL AORTOGRAM W/LOWER EXTREMITY N/A 06/04/2019   Procedure: ABDOMINAL AORTOGRAM W/LOWER EXTREMITY;  Surgeon: Runell Gess, MD;  [facility] INVASIVE CV LAB;  Service: Cardiovascular;  Laterality: N/A;   BIOPSY  02/08/2022   Procedure: BIOPSY;  Surgeon: Tressia Danas,  MD;  Location: [facility] ENDOSCOPY;  Service: Gastroenterology;;   CARDIAC SURGERY     x2    CARDIOVERSION N/A 08/25/2021   Procedure: CARDIOVERSION;  Surgeon: Chilton Si, MD;  Location: [facility] ENDOSCOPY;  Service: Cardiovascular;  Laterality: N/A;   CARDIOVERSION N/A 11/23/2021   Procedure: CARDIOVERSION;  Surgeon: Vesta Mixer, MD;  Location: [facility] ENDOSCOPY;  Service: Cardiovascular;  Laterality: N/A;   COLONOSCOPY     Age 59. [facility]   COLONOSCOPY  WITH PROPOFOL N/A 02/08/2022   Procedure: COLONOSCOPY WITH PROPOFOL;  Surgeon: Tressia Danas, MD;  [facility] ENDOSCOPY;  Service: Gastroenterology;  Laterality: N/A;   CORONARY ARTERY BYPASS GRAFT  1990   x 2 at [facility], redo x 3 @ [facility] approx 1998   ESOPHAGOGASTRODUODENOSCOPY (EGD) WITH PROPOFOL N/A 02/08/2022   Procedure: ESOPHAGOGASTRODUODENOSCOPY (EGD) WITH PROPOFOL;  Surgeon: Tressia Danas, MD;  [facility] ENDOS[facility];  Service: Gastroenterology;  Laterality: N/A;   PERIPHERAL VASCULAR ATHERECTOMY Right 06/04/2019   Procedure: PERIPHERAL VASCULAR ATHERECTOMY;  Surgeon: Runell Gess, MD;  Location: [facility] INVASIVE CV LAB;  Service: Cardiovascular;  Laterality: Right;  common iliac   PERIPHERAL VASCULAR INTERVENTION Right 06/04/2019   Procedure: PERIPHERAL VASCULAR INTERVENTION;  Surgeon: Runell Gess, MD;  [facility];  Service: Cardiovascular;  Laterality: Right;  common iliac   POLYPECTOMY  02/08/2022   Procedure: POLYPECTOMY;  Surgeon: Tressia Danas, MD;  [facility] ENDOS[facility];  Service: Gastroenterology;;   TEE WITHOUT CARDIOVERSION N/A 08/25/2021   Procedure: TRANSESOPHAGEAL ECHOCARDIOGRAM (TEE);  Surgeon: Chilton Si, MD;  Location: [facility] ENDOSCOPY;  Service: Cardiovascular;  Laterality: N/A;   TEE WITHOUT CARDIOVERSION N/A 11/23/2021   Procedure: TRANSESOPHAGEAL ECHOCARDIOGRAM (TEE);  Surgeon: Vesta Mixer, MD;  Location: [facility] ENDOSCOPY;  Service: Cardiovascular;  Laterality: N/A;   Family History  Problem Relation Age of Onset   Heart disease Mother    Heart disease Father    Diabetes Sister    Prostate cancer Neg Hx    Bladder Cancer Neg Hx    Kidney cancer Neg Hx    Colon cancer Neg Hx    Esophageal cancer Neg Hx    Social History   Tobacco Use   Smoking status: Every Day    Types: Cigarettes   Smokeless tobacco: Never   Tobacco comments:    8-9 cigarettes per day  Vaping Use   Vaping status: Never Used  Substance Use Topics    Alcohol use: No   Drug use: No   Current Outpatient Medications  Medication Sig Dispense Refill   amiodarone (PACERONE) 200 MG tablet TAKE ONE HALF (1/2) TABLET BY MOUTH DAILY 45 tablet 3   atorvastatin (LIPITOR) 80 MG tablet Take 80 mg by mouth daily.     carvedilol (COREG) 3.125 MG tablet TAKE ONE TABLET BY MOUTH TWICE A DAY 180 tablet 3   Cholecalciferol (VITAMIN D) 50 MCG (2000 UT) CAPS Take 4,000 Units by mouth daily.     DULoxetine (CYMBALTA) 20 MG capsule Take 1 capsule (20 mg total) by mouth daily. 90 capsule 3   ELIQUIS 5 MG TABS tablet TAKE ONE TABLET BY MOUTH TWICE A DAY 180 tablet 1   finasteride (PROSCAR) 5 MG tablet Take 1 tablet (5 mg total) by mouth daily. 90 tablet 0   gabapentin (NEURONTIN) 300 MG capsule Take 300 mg by mouth in the morning, at noon, in the evening, and at bedtime.     isosorbide mononitrate (IMDUR) 60 MG 24 hr tablet Take 1.5 tablets (  90 mg total) by mouth daily. 135 tablet 3   lansoprazole (PREVACID) 30 MG capsule TAKE ONE CAPSULE BY MOUTH DAILY 90 capsule 1   levothyroxine (SYNTHROID) 75 MCG tablet Take 75 mcg by mouth daily before breakfast.     Multiple Vitamin (MULTIVITAMIN) tablet Take 1 tablet by mouth daily.     nitroGLYCERIN (NITROSTAT) 0.4 MG SL tablet PLACE ONE TABLET BY MOUTH UNDER THE TONGUE AS NEEDED FOR CHEST PAIN 25 tablet 3   potassium chloride (KLOR-CON) 10 MEQ tablet TAKE TWO TABLETS BY MOUTH TWICE A DAY 180 tablet 3   torsemide (DEMADEX) 20 MG tablet Take 1 tablet (20 mg total) by mouth 2 (two) times daily. 180 tablet 3   traZODone (DESYREL) 50 MG tablet Take 1 tablet by mouth at bedtime.     vitamin B-12 (CYANOCOBALAMIN) 1000 MCG tablet Take 1,000 mcg by mouth daily.     vitamin C (ASCORBIC ACID) 500 MG tablet Take 500 mg by mouth daily.     No current facility-administered medications for this visit.   Allergies  Allergen Reactions   Omeprazole     doesn't work well with plavix    Other Palpitations    Certain steroids give  patient heart palpitations  Allergy to green peppers     Review of Systems: All systems reviewed and negative except where noted in HPI.    No results found.  Physical Exam: BP 118/62   Pulse (!) 57   Ht 6' (1.829 m)   Wt 183 lb (83 kg)   BMI 24.82 kg/m  Constitutional: Pleasant,well-developed, male in no acute distress. Neurological: Alert and oriented to person place and time. Psychiatric: Normal mood and affect. Behavior is normal.   ASSESSMENT: 77 y.o. male here for assessment of the following  1. Gastroesophageal reflux disease, unspecified whether esophagitis present   2. Long-term current use of proton pump inhibitor therapy   3. Altered bowel habits   4. History of iron deficiency   5. History of colon polyps    Symptoms of reflux for the past year.  Recent EGD shows no evidence of Barrett's or concerning pathology.  He has been on 30 mg of Prevacid for the past year without dose adjustment.  This clearly works to prevent his symptoms.  We did discuss long-term risks of chronic PPI therapy for a bit today.  I think reasonable to reduce his dose if he can tolerate it, use the lowest dose needed to control his symptoms.  We offered changing his 30 mg dosing to 15 mg/day dosing or him to start taking 30 mg every other day.  He would prefer to take 30 mg every other day for now and see if he can wean down.  If he cannot, he understands risks of therapy and wants to continue it for symptom control.  We discussed change in his bowel habits lately.  He is not taking any iron.  His colonoscopy is up-to-date without concerning pathology.  Will try adding Citrucel once daily to his regimen to see if this can provide better regularity and stool form.  Otherwise no clear cause for his iron deficiency previously.  Now off iron for some time and remains on Eliquis.  Will recheck CBC and iron studies to see if he has had recurrent iron deficiency anemia.  If he has then we may consider a  capsule endoscopy.  He previously declined this.  Otherwise we discussed his history of colon polyps, he would not be due for  several years after his last exam, at his age he declines further colonoscopy surveillance which I think is very reasonable.   PLAN: - continue prevacid - discussed long term risks / benefits of chronic PPI - long term use lowest daily dose of PPI needed to control symptoms - try taking prevacid every other day, or 15mg  / daily. He wants to try 30mg  every other day for now - add Citrucel to take daily - repeat CBC and TIBC / ferritin OFF iron to see if he remains deficient - if so consider capsule study - no further surveillance colonoscopy for history of polyps, given his age  Harlin Rain, MD [facility]

## 2023-09-12 NOTE — Patient Instructions (Addendum)
Please go to the lab in the basement of our building to have lab work done as you leave today. Hit "B" for basement when you get on the elevator.  When the doors open the lab is on your left.  We will call you with the results. Thank you.   Continue Prevacid 30 mg: Take daily to every other day as needed  Please purchase the following medications over the counter and take as directed: Citrucel: Take once daily  Thank you for entrusting me with your care and for choosing [facility], Dr. Ileene Patrick   If your blood pressure at your visit was 140/90 or greater, please contact your primary care physician to follow up on this. ______________________________________________________  If you are age 40 or older, your body mass index should be between 23-30. Your Body mass index is 24.82 kg/m. If this is out of the aforementioned range listed, please consider follow up with your Primary Care Provider.  If you are age 54 or younger, your body mass index should be between 19-25. Your Body mass index is 24.82 kg/m. If this is out of the aformentioned range listed, please consider follow up with your Primary Care Provider.  ________________________________________________________  The [facility] providers would like to encourage you to use [facility] to communicate with providers for non-urgent requests or questions.  Due to long hold times on the telephone, sending your provider a message by [facility] may be a faster and more efficient way to get a response.  Please allow 48 business hours for a response.  Please remember that this is for non-urgent requests.  _______________________________________________________  Due to recent changes in healthcare laws, you may see the results of your imaging and laboratory studies on MyChart before your provider has had a chance to review them.  We understand that in some cases there may be results that are confusing or concerning to you. Not all laboratory  results come back in the same time frame and the provider may be waiting for multiple results in order to interpret others.  Please give [place] 48 hours in order for your provider to thoroughly review all the results before contacting the office for clarification of your results.

## 2023-09-13 ENCOUNTER — Other Ambulatory Visit: Payer: Self-pay | Admitting: *Deleted

## 2023-09-13 DIAGNOSIS — D509 Iron deficiency anemia, unspecified: Secondary | ICD-10-CM

## 2023-09-19 ENCOUNTER — Other Ambulatory Visit: Payer: Self-pay | Admitting: Cardiovascular Disease

## 2023-09-19 DIAGNOSIS — R072 Precordial pain: Secondary | ICD-10-CM

## 2023-10-26 ENCOUNTER — Ambulatory Visit: Payer: Medicare Other | Admitting: Family Medicine

## 2023-10-26 NOTE — Progress Notes (Unsigned)
[name] [address] [place] [address] Phone: [phone] Subjective:   Bruce Donath, am serving as a scribe for Dr. Antoine Primas.  I'm seeing this patient by the request  of:  Lauro Regulus, MD  CC: multiple complaints  FTD:[id number]  08/31/2023 Seems stable at the moment. Discussed the potential need for epidurals if needed. Patient wants to continue to see how the Cymbalta does at this moment. Encouraged to be active when possible   Patient is no longer responding to the injections.  Referred to hand surgery to discuss options     Update 10/27/2023 Delphine Depietro Juhnke is a 77 y.o. male coming in with complaint of lumbar spine and L carpal tunnel pain.  Reviewing outside notes does appear the patient will have a carpal tunnel release in the near future.  Regarding his back pain was started on Cymbalta and did have the L4 nerve root block.  Had been progressing.  Patient states that he is going to have surgery by Dr. Romana Juniper in the future.   Does not have as much fullness feeling in the R foot but does have numbness in the leg that continues. Would like another epidural prior to end of year.   Also would like labwork to check hemoglobin.    Epidural on the July 26, 2023 and more of a right sided L4 nerve root block.    Past Medical History:  Diagnosis Date   Carotid artery narrowing    History of blood clots    History of colon polyps    Hyperlipidemia    Hypertension    MI (myocardial infarction) [facility])    Past Surgical History:  Procedure Laterality Date   ABDOMINAL AORTOGRAM W/LOWER EXTREMITY N/A 06/04/2019   Procedure: ABDOMINAL AORTOGRAM W/LOWER EXTREMITY;  Surgeon: Runell Gess, MD;  Location: [facility] INVASIVE CV LAB;  Service: Cardiovascular;  Laterality: N/A;   BIOPSY  02/08/2022   Procedure: BIOPSY;  Surgeon: Tressia Danas, MD;  Location: [facility] ENDOSCOPY;  Service: Gastroenterology;;   CARDIAC SURGERY     x2     CARDIOVERSION N/A 08/25/2021   Procedure: CARDIOVERSION;  Surgeon: Chilton Si, MD;  Location: [facility] ENDOSCOPY;  Service: Cardiovascular;  Laterality: N/A;   CARDIOVERSION N/A 11/23/2021   Procedure: CARDIOVERSION;  Surgeon: Vesta Mixer, MD;  Location: [facility] ENDOSCOPY;  Service: Cardiovascular;  Laterality: N/A;   COLONOSCOPY     Age 86. [facility]   COLONOSCOPY WITH PROPOFOL N/A 02/08/2022   Procedure: COLONOSCOPY WITH PROPOFOL;  Surgeon: Tressia Danas, MD;  Location: [facility] ENDOSCOPY;  Service: Gastroenterology;  Laterality: N/A;   CORONARY ARTERY BYPASS GRAFT  1990   x 2 at [facility], redo x 3 @ [facility] approx 1998   ESOPHAGOGASTRODUODENOSCOPY (EGD) WITH PROPOFOL N/A 02/08/2022   Procedure: ESOPHAGOGASTRODUODENOSCOPY (EGD) WITH PROPOFOL;  Surgeon: Tressia Danas, MD;  Location: [facility] ENDOSCOPY;  Service: Gastroenterology;  Laterality: N/A;   PERIPHERAL VASCULAR ATHERECTOMY Right 06/04/2019   Procedure: PERIPHERAL VASCULAR ATHERECTOMY;  Surgeon: Runell Gess, MD;  Location: [facility] INVASIVE CV LAB;  Service: Cardiovascular;  Laterality: Right;  common iliac   PERIPHERAL VASCULAR INTERVENTION Right 06/04/2019   Procedure: PERIPHERAL VASCULAR INTERVENTION;  Surgeon: Runell Gess, MD;  [facility]AS[facility] CV LAB;  Service: Cardiovascular;  Laterality: Right;  common iliac   POLYPECTOMY  02/08/2022   Procedure: POLYPECTOMY;  Surgeon: Tressia Danas, MD;  Location: [facility] ENDOSCOPY;  Service: Gastroenterology;;   TEE WITHOUT CARDIOVERSION N/A 08/25/2021  Procedure: TRANSESOPHAGEAL ECHOCARDIOGRAM (TEE);  Surgeon: Chilton Si, MD;  Location: [facility] ENDOSCOPY;  Service: Cardiovascular;  Laterality: N/A;   TEE WITHOUT CARDIOVERSION N/A 11/23/2021   Procedure: TRANSESOPHAGEAL ECHOCARDIOGRAM (TEE);  Surgeon: Elease Hashimoto, Deloris Ping, MD;  Location: [facility] ENDOSCOPY;  Service: Cardiovascular;  Laterality: N/A;   Social History   Socioeconomic History   Marital status: Married    Spouse name:  Not on file   Number of children: Not on file   Years of education: Not on file   Highest education level: Not on file  Occupational History   Not on file  Tobacco Use   Smoking status: Every Day    Types: Cigarettes   Smokeless tobacco: Never   Tobacco comments:    8-9 cigarettes per day  Vaping Use   Vaping status: Never Used  Substance and Sexual Activity   Alcohol use: No   Drug use: No   Sexual activity: Not on file  Other Topics Concern   Not on file  Social History Narrative   Not on file   Social Determinants of Health   Financial Resource Strain: Low Risk  (06/22/2023)   Received from [facility]   Overall Financial Resource Strain (CARDIA)    Difficulty of Paying Living Expenses: Not hard at all  Food Insecurity: No Food Insecurity (06/22/2023)   Received from [facility]   Hunger Vital Sign    Worried About Running Out of Food in the Last Year: Never true    Ran Out of Food in the Last Year: Never true  Transportation Needs: No Transportation Needs (06/22/2023)   Received from [facility]    In the past 12 months, has lack of transportation kept you from medical appointments or from getting medications?: No    Lack of Transportation (Non-Medical): No  Physical Activity: Not on file  Stress: Not on file  Social Connections: Not on file   Allergies  Allergen Reactions   Omeprazole     doesn't work well with plavix    Other Palpitations    Certain steroids give patient heart palpitations  Allergy to green peppers   Family History  Problem Relation Age of Onset   Heart disease Mother    Heart disease Father    Diabetes Sister    Prostate cancer Neg Hx    Bladder Cancer Neg Hx    Kidney cancer Neg Hx    Colon cancer Neg Hx    Esophageal cancer Neg Hx     Current Outpatient Medications (Endocrine & Metabolic):    levothyroxine (SYNTHROID) 75 MCG tablet, Take 75 mcg by  mouth daily before breakfast.  Current Outpatient Medications (Cardiovascular):    amiodarone (PACERONE) 200 MG tablet, TAKE ONE HALF (1/2) TABLET BY MOUTH DAILY   atorvastatin (LIPITOR) 80 MG tablet, Take 80 mg by mouth daily.   carvedilol (COREG) 3.125 MG tablet, TAKE ONE TABLET BY MOUTH TWICE A DAY   isosorbide mononitrate (IMDUR) 60 MG 24 hr tablet, Take 1.5 tablets (90 mg total) by mouth daily.   nitroGLYCERIN (NITROSTAT) 0.4 MG SL tablet, PLACE ONE TABLET BY MOUTH UNDER THE TONGUE AS NEEDED FOR CHEST PAIN   torsemide (DEMADEX) 20 MG tablet, Take 1 tablet (20 mg total) by mouth 2 (two) times daily.    Current Outpatient Medications (Hematological):    ELIQUIS 5 MG TABS tablet, TAKE ONE TABLET BY MOUTH TWICE A DAY   vitamin B-12 (CYANOCOBALAMIN) 1000  MCG tablet, Take 1,000 mcg by mouth daily.  Current Outpatient Medications (Other):    Cholecalciferol (VITAMIN D) 50 MCG (2000 UT) CAPS, Take 4,000 Units by mouth daily.   DULoxetine (CYMBALTA) 20 MG capsule, Take 1 capsule (20 mg total) by mouth daily.   finasteride (PROSCAR) 5 MG tablet, Take 1 tablet (5 mg total) by mouth daily.   gabapentin (NEURONTIN) 300 MG capsule, Take 300 mg by mouth in the morning, at noon, in the evening, and at bedtime.   lansoprazole (PREVACID) 30 MG capsule, Take 30 mg daily to every other day as needed   methylcellulose (CITRUCEL) oral powder, Take 1 packet by mouth daily.   Multiple Vitamin (MULTIVITAMIN) tablet, Take 1 tablet by mouth daily.   potassium chloride (KLOR-CON) 10 MEQ tablet, TAKE TWO TABLETS BY MOUTH TWICE A DAY   traZODone (DESYREL) 50 MG tablet, Take 1 tablet by mouth at bedtime.   vitamin C (ASCORBIC ACID) 500 MG tablet, Take 500 mg by mouth daily.   Reviewed prior external information including notes and imaging from  primary care provider As well as notes that were available from care everywhere and other healthcare systems.  Past medical history, social, surgical and family  history all reviewed in electronic medical record.  No pertanent information unless stated regarding to the chief complaint.   Review of Systems:  No headache, visual changes, nausea, vomiting, diarrhea, constipation, dizziness, abdominal pain, skin rash, fevers, chills, night sweats, weight loss, swollen lymph nodes, body aches, joint swelling, chest pain, shortness of breath, mood changes. POSITIVE muscle aches, body aches  Objective  Blood pressure 132/62, pulse (!) 57, height 6' (1.829 m), SpO2 96%.   General: No apparent distress alert and oriented x3 mood and affect normal, dressed appropriately.  HEENT: Pupils equal, extraocular movements intact  Respiratory: Patient's speak in full sentences and does not appear short of breath  Cardiovascular: No lower extremity edema, non tender, no erythema  Wrist exam shows patient has good grip strength at the moment.  Left wrist though does have some thenar eminence wasting noted. Still has some numbness on the leg and seems to be on the lateral L3 distribution. With the aid of a cane   Impression and Recommendations:    The above documentation has been reviewed and is accurate and complete Judi Saa, DO

## 2023-10-27 ENCOUNTER — Encounter: Payer: Self-pay | Admitting: Family Medicine

## 2023-10-27 ENCOUNTER — Telehealth: Payer: Self-pay | Admitting: *Deleted

## 2023-10-27 ENCOUNTER — Ambulatory Visit: Payer: Medicare Other | Admitting: Family Medicine

## 2023-10-27 VITALS — BP 132/62 | HR 57 | Ht 72.0 in

## 2023-10-27 DIAGNOSIS — M48062 Spinal stenosis, lumbar region with neurogenic claudication: Secondary | ICD-10-CM

## 2023-10-27 DIAGNOSIS — M5416 Radiculopathy, lumbar region: Secondary | ICD-10-CM

## 2023-10-27 DIAGNOSIS — D509 Iron deficiency anemia, unspecified: Secondary | ICD-10-CM | POA: Diagnosis not present

## 2023-10-27 DIAGNOSIS — M2550 Pain in unspecified joint: Secondary | ICD-10-CM

## 2023-10-27 DIAGNOSIS — G5602 Carpal tunnel syndrome, left upper limb: Secondary | ICD-10-CM

## 2023-10-27 LAB — COMPREHENSIVE METABOLIC PANEL
ALT: 18 U/L (ref 0–53)
AST: 14 U/L (ref 0–37)
Albumin: 3.1 g/dL — ABNORMAL LOW (ref 3.5–5.2)
Alkaline Phosphatase: 96 U/L (ref 39–117)
BUN: 11 mg/dL (ref 6–23)
CO2: 29 meq/L (ref 19–32)
Calcium: 8.4 mg/dL (ref 8.4–10.5)
Chloride: 105 meq/L (ref 96–112)
Creatinine, Ser: 0.97 mg/dL (ref 0.40–1.50)
GFR: 74.95 mL/min (ref >60.00)
Glucose, Bld: 94 mg/dL (ref 70–99)
Potassium: 3.8 meq/L (ref 3.5–5.1)
Sodium: 139 meq/L (ref 135–145)
Total Bilirubin: 0.5 mg/dL (ref 0.2–1.2)
Total Protein: 5.5 g/dL — ABNORMAL LOW (ref 6.0–8.3)

## 2023-10-27 LAB — CBC WITH DIFFERENTIAL/PLATELET
Basophils Absolute: 0.1 10*3/uL (ref 0.0–0.1)
Basophils Relative: 1.4 % (ref 0.0–3.0)
Eosinophils Absolute: 0.2 10*3/uL (ref 0.0–0.7)
Eosinophils Relative: 2.8 % (ref 0.0–5.0)
HCT: 39.9 % (ref 39.0–52.0)
Hemoglobin: 13 g/dL (ref 13.0–17.0)
Lymphocytes Relative: 10.9 % — ABNORMAL LOW (ref 12.0–46.0)
Lymphs Abs: 0.7 10*3/uL (ref 0.7–4.0)
MCHC: 32.7 g/dL (ref 30.0–36.0)
MCV: 91.6 fL (ref 78.0–100.0)
Monocytes Absolute: 0.5 10*3/uL (ref 0.1–1.0)
Monocytes Relative: 7.8 % (ref 3.0–12.0)
Neutro Abs: 5.3 10*3/uL (ref 1.4–7.7)
Neutrophils Relative %: 77.1 % — ABNORMAL HIGH (ref 43.0–77.0)
Platelets: 222 10*3/uL (ref 150.0–400.0)
RBC: 4.36 Mil/uL (ref 4.22–5.81)
RDW: 20.2 % — ABNORMAL HIGH (ref 11.5–15.5)
WBC: 6.9 10*3/uL (ref 4.0–10.5)

## 2023-10-27 LAB — IBC + FERRITIN
Ferritin: 19.8 ng/mL — ABNORMAL LOW (ref 22.0–322.0)
Iron: 86 ug/dL (ref 42–165)
Saturation Ratios: 32.5 % (ref 20.0–50.0)
TIBC: 264.6 ug/dL (ref 250.0–450.0)
Transferrin: 189 mg/dL — ABNORMAL LOW (ref 212.0–360.0)

## 2023-10-27 LAB — FERRITIN: Ferritin: 19.8 ng/mL — ABNORMAL LOW (ref 22.0–322.0)

## 2023-10-27 LAB — IBC PANEL
Iron: 86 ug/dL (ref 42–165)
Saturation Ratios: 32.5 % (ref 20.0–50.0)
TIBC: 264.6 ug/dL (ref 250.0–450.0)
Transferrin: 189 mg/dL — ABNORMAL LOW (ref 212.0–360.0)

## 2023-10-27 NOTE — Assessment & Plan Note (Addendum)
Patient is going to have surgical intervention in the near future.

## 2023-10-27 NOTE — Assessment & Plan Note (Signed)
Recheck labs to see if any infusion of iron is necessary.  Still following up with gastroenterology as well.

## 2023-10-27 NOTE — Telephone Encounter (Signed)
   Pre-operative Risk Assessment    Patient Name: Gregory Shepherd  DOB: 1946-04-01 MRN: [id number]  DATE OF LAST VISIT: 07/25/23 ANDY TILLERY,PAC; 07/18/23 HAO MENG, PAC DATE OF NEXT VISIT: 01/16/24 DR. BERRY   Request for Surgical Clearance    Procedure:   LEFT CARPAL TUNNEL RELEASE  Date of Surgery:  Clearance TBD                                 Surgeon:  DR. Dominica Severin Surgeon's Group or Practice Name:  [facility] Phone number:  [phone] Fax number:  [phone] SHERRY WILLS   Type of Clearance Requested:   - Medical  - Pharmacy:  Hold Apixaban (Eliquis)     Type of Anesthesia:  Local    Additional requests/questions:    Elpidio Anis   10/27/2023, 4:05 PM

## 2023-10-27 NOTE — Patient Instructions (Addendum)
Labwork today See me as needed RL3 and L4 NR injections

## 2023-10-27 NOTE — Assessment & Plan Note (Signed)
Patient has responded relatively well to the L4 nerve root injection that has helped out significantly with his foot pain.  I do feel that the proximal L3 side is needed as well.  Patient would like to be more active for his son's wedding in the near future at the end of December so we will try to get these injections done in the near future.  Follow-up with me again 6 to 8 weeks after the injection

## 2023-10-28 ENCOUNTER — Other Ambulatory Visit: Payer: Self-pay | Admitting: *Deleted

## 2023-10-28 DIAGNOSIS — D509 Iron deficiency anemia, unspecified: Secondary | ICD-10-CM

## 2023-10-31 ENCOUNTER — Encounter: Payer: Self-pay | Admitting: Cardiovascular Disease

## 2023-10-31 NOTE — Telephone Encounter (Signed)
Patient with diagnosis of A fib on Eliquis (apixaban) 5 mg PO BID for anticoagulation.    Procedure: L carpal tunnel release Date of procedure: TBD   CHA2DS2-VASc Score = 5   This indicates a 7.2% annual risk of stroke. The patient's score is based upon: CHF History: 1 HTN History: 1 Diabetes History: 0 Stroke History: 0 Vascular Disease History: 1 Age Score: 2 Gender Score: 0      DM in problem list, however no hx of A1c >6.5% in Epic or care everywhere and no history of taking medication for T2DM.   CrCl 73.7 mL/min (actual BW) Platelet count 222  Per office protocol, patient can hold Eliquis (apixaban) for 2 days prior to procedure.   Patient will not need bridging with Lovenox (enoxaparin) around procedure.  **This guidance is not considered finalized until pre-operative APP has relayed final recommendations.**  Nils Pyle, PharmD PGY1 Pharmacy Resident

## 2023-10-31 NOTE — Telephone Encounter (Signed)
Opened in error

## 2023-11-01 ENCOUNTER — Telehealth: Payer: Self-pay

## 2023-11-01 NOTE — Telephone Encounter (Signed)
  Patient Consent for Virtual Visit        Gregory Shepherd has provided verbal consent on 11/01/2023 for a virtual visit (video or telephone).   CONSENT FOR VIRTUAL VISIT FOR:  Gregory Shepherd  By participating in this virtual visit I agree to the following:  I hereby voluntarily request, consent and authorize [facility] and its employed or contracted physicians, physician assistants, nurse practitioners or other licensed health care professionals (the Practitioner), to provide me with telemedicine health care services (the "Services") as deemed necessary by the treating Practitioner. I acknowledge and consent to receive the Services by the Practitioner via telemedicine. I understand that the telemedicine visit will involve communicating with the Practitioner through live audiovisual communication technology and the disclosure of certain medical information by electronic transmission. I acknowledge that I have been given the opportunity to request an in-person assessment or other available alternative prior to the telemedicine visit and am voluntarily participating in the telemedicine visit.  I understand that I have the right to withhold or withdraw my consent to the use of telemedicine in the course of my care at any time, without affecting my right to future care or treatment, and that the Practitioner or I may terminate the telemedicine visit at any time. I understand that I have the right to inspect all information obtained and/or recorded in the course of the telemedicine visit and may receive copies of available information for a reasonable fee.  I understand that some of the potential risks of receiving the Services via telemedicine include:  Delay or interruption in medical evaluation due to technological equipment failure or disruption; Information transmitted may not be sufficient (e.g. poor resolution of images) to allow for appropriate medical decision making by the  Practitioner; and/or  In rare instances, security protocols could fail, causing a breach of personal health information.  Furthermore, I acknowledge that it is my responsibility to provide information about my medical history, conditions and care that is complete and accurate to the best of my ability. I acknowledge that Practitioner's advice, recommendations, and/or decision may be based on factors not within their control, such as incomplete or inaccurate data provided by me or distortions of diagnostic images or specimens that may result from electronic transmissions. I understand that the practice of medicine is not an exact science and that Practitioner makes no warranties or guarantees regarding treatment outcomes. I acknowledge that a copy of this consent can be made available to me via my patient portal [facility] MyChart), or I can request a printed copy by calling the office of [facility].    I understand that my insurance will be billed for this visit.   I have read or had this consent read to me. I understand the contents of this consent, which adequately explains the benefits and risks of the Services being provided via telemedicine.  I have been provided ample opportunity to ask questions regarding this consent and the Services and have had my questions answered to my satisfaction. I give my informed consent for the services to be provided through the use of telemedicine in my medical care

## 2023-11-01 NOTE — Telephone Encounter (Signed)
Preop clearance appt now scheduled, med rec and consent done

## 2023-11-01 NOTE — Telephone Encounter (Signed)
   Name: Gregory Shepherd  DOB: 1946-07-17  MRN: [id number]  Primary Cardiologist: Nanetta Batty, MD  Chart reviewed as part of pre-operative protocol coverage. Because of [name]'s past medical history and time since last visit, he will require a follow-up telephone visit in order to better assess preoperative cardiovascular risk.  Pre-op covering staff: - Please schedule appointment and call patient to inform them. If patient already had an upcoming appointment within acceptable timeframe, please add "pre-op clearance" to the appointment notes so provider is aware. - Please contact requesting surgeon's office via preferred method (i.e, phone, fax) to inform them of need for appointment prior to surgery.  Per office protocol, patient can hold Eliquis (apixaban) for 2 days prior to procedure.  Please resume when medically safe to do so. Patient will not need bridging with Lovenox (enoxaparin) around procedure.   Sharlene Dory, PA-C  11/01/2023, 11:18 AM

## 2023-11-02 ENCOUNTER — Telehealth: Payer: Self-pay

## 2023-11-02 NOTE — Telephone Encounter (Signed)
   Pre-operative Risk Assessment    Patient Name: Gregory Shepherd  DOB: Oct 22, 1946 MRN: [id number]      Request for Surgical Clearance    Procedure:   Epidural/ Nerve Root   Date of Surgery:  Clearance TBD                                 Surgeon:  none listed Surgeon's Group or Practice Name:  [facility]  Phone number:  [phone] Fax number:  [phone]   Type of Clearance Requested:   - Medical  - Pharmacy:  Hold Apixaban (Eliquis)     Type of Anesthesia:   none listed    Additional requests/questions:   n/a  Rondel Baton   11/02/2023, 3:33 PM

## 2023-11-05 NOTE — Telephone Encounter (Signed)
Patient with diagnosis of atrial fibrillation on Eliquis for anticoagulation.    Procedure:   Epidural/ Nerve Root    Date of Surgery:  Clearance TBD       CHA2DS2-VASc Score = 5   This indicates a 7.2% annual risk of stroke. The patient's score is based upon: CHF History: 1 HTN History: 1 Diabetes History: 0 Stroke History: 0 Vascular Disease History: 1 Age Score: 2 Gender Score: 0    CrCl 74 Platelet count 222  Per office protocol, patient can hold Eliquis for 3 days prior to procedure.   Patient will not need bridging with Lovenox (enoxaparin) around procedure.  **This guidance is not considered finalized until pre-operative APP has relayed final recommendations.**

## 2023-11-07 NOTE — Telephone Encounter (Signed)
Patient's wife is calling to schedule sooner tele-visit. Please advise.

## 2023-11-07 NOTE — Telephone Encounter (Addendum)
   Patient Name: Gregory Shepherd  DOB: 01/11/1946 MRN: [id number]  Primary Cardiologist: Nanetta Batty, MD  Chart reviewed as part of pre-operative protocol coverage. Given past medical history and time since last visit, based on ACC/AHA guidelines, Gregory Shepherd is at acceptable risk for the planned procedure without further cardiovascular testing.   Per office protocol, patient can hold Eliquis for 3 days prior to procedure.   Patient will not need bridging with Lovenox (enoxaparin) around procedure.  The patient was advised that if he develops new symptoms prior to surgery to contact our office to arrange for a follow-up visit, and he verbalized understanding.    I will route this recommendation to the requesting party via Epic fax function and remove from pre-op pool.  Please call with questions.  Joni Reining, NP 11/18/2023, 2:16 PM

## 2023-11-07 NOTE — Telephone Encounter (Signed)
I s/w the pt and his wife. Pt's wife tells me that requesting office is trying to move injection up sooner as the pt's son is getting married 12/05/23. Need the clearance before they will schedule injection. I first offered 11/22/23. She asked if we had anything sooner a injection needs to be done before 12/05/23 son's wedding. I said the only thing I can do is no earlier than 11/18/23. Pt's wife is happy with that.

## 2023-11-14 ENCOUNTER — Other Ambulatory Visit: Payer: Self-pay | Admitting: Cardiovascular Disease

## 2023-11-16 NOTE — Progress Notes (Addendum)
Virtual Visit via Telephone Note   Because of Gregory Shepherd's co-morbid illnesses, he is at least at moderate risk for complications without adequate follow up.  This format is felt to be most appropriate for this patient at this time.  The patient did not have access to video technology/had technical difficulties with video requiring transitioning to audio format only (telephone).  All issues noted in this document were discussed and addressed.  No physical exam could be performed with this format.  Please refer to the patient's chart for his consent to telehealth for [facility].  Evaluation Performed:  Preoperative cardiovascular risk assessment _____________   Date:  11/18/2023   Patient ID:  Gregory Shepherd, Gregory Shepherd 27-Jan-1946, MRN [id number] Patient Location:  Home Provider location:   Office  Primary Care Provider:  Lauro Regulus, MD Primary Cardiologist:  Nanetta Batty, MD  Chief Complaint / Patient Profile   77 y.o. y/o male with a h/o h/o chronic systolic CHF, persistent AF, and CAD s/p CABG   He is pending Carpal Tunnel repair with Dr.Gramig,, on TBD and presents today for telephonic preoperative cardiovascular risk assessment.  History of Present Illness    Gregory Shepherd is a 77 y.o. male who presents via audio/video conferencing for a telehealth visit today.  Pt was last seen in cardiology clinic on 07/25/2023 by Otilio Saber., PA.  At that time Gregory Shepherd was doing well   The patient is now pending procedure as outlined above. Since his last visit, he has been stable from a cardiac standpoint.  No chest pain, shortness of breath, fatigue, his activity is limited due to chronic pain.  He is also due to have carpal tunnel repair on a separate date and will require a separate request for surgical clearance.  He denies any bleeding or excessive bruising on Eliquis.  Past Medical History    Past Medical History:  Diagnosis Date   Carotid  artery narrowing    History of blood clots    History of colon polyps    Hyperlipidemia    Hypertension    MI (myocardial infarction) [facility])    Past Surgical History:  Procedure Laterality Date   ABDOMINAL AORTOGRAM W/LOWER EXTREMITY N/A 06/04/2019   Procedure: ABDOMINAL AORTOGRAM W/LOWER EXTREMITY;  Surgeon: Runell Gess, MD;  Location: [facility] INVASIVE CV LAB;  Service: Cardiovascular;  Laterality: N/A;   BIOPSY  02/08/2022   Procedure: BIOPSY;  Surgeon: Tressia Danas, MD;  Location: [facility] ENDOSCOPY;  Service: Gastroenterology;;   CARDIAC SURGERY     x2    CARDIOVERSION N/A 08/25/2021   Procedure: CARDIOVERSION;  Surgeon: Chilton Si, MD;  Location: [facility] ENDOSCOPY;  Service: Cardiovascular;  Laterality: N/A;   CARDIOVERSION N/A 11/23/2021   Procedure: CARDIOVERSION;  Surgeon: Vesta Mixer, MD;  Location: [facility] ENDOSCOPY;  Service: Cardiovascular;  Laterality: N/A;   COLONOSCOPY     Age 24. [facility]   COLONOSCOPY WITH PROPOFOL N/A 02/08/2022   Procedure: COLONOSCOPY WITH PROPOFOL;  Surgeon: Tressia Danas, MD;  Location: [facility] ENDOSCOPY;  Service: Gastroenterology;  Laterality: N/A;   CORONARY ARTERY BYPASS GRAFT  1990   x 2 at [facility], redo x 3 @ [facility] approx 1998   ESOPHAGOGASTRODUODENOSCOPY (EGD) WITH PROPOFOL N/A 02/08/2022   Procedure: ESOPHAGOGASTRODUODENOSCOPY (EGD) WITH PROPOFOL;  Surgeon: Tressia Danas, MD;  Location: [facility] ENDOSCOPY;  Service: Gastroenterology;  Laterality: N/A;   PERIPHERAL VASCULAR ATHERECTOMY Right 06/04/2019   Procedure: PERIPHERAL VASCULAR ATHERECTOMY;  Surgeon: Runell Gess,  MD;  Location: [facility] INVASIVE CV LAB;  Service: Cardiovascular;  Laterality: Right;  common iliac   PERIPHERAL VASCULAR INTERVENTION Right 06/04/2019   Procedure: PERIPHERAL VASCULAR INTERVENTION;  Surgeon: Runell Gess, MD;  Location: [facility] INVASIVE CV LAB;  Service: Cardiovascular;  Laterality: Right;  common iliac   POLYPECTOMY  02/08/2022   Procedure:  POLYPECTOMY;  Surgeon: Tressia Danas, MD;  Location: [facility] ENDOSCOPY;  Service: Gastroenterology;;   TEE WITHOUT CARDIOVERSION N/A 08/25/2021   Procedure: TRANSESOPHAGEAL ECHOCARDIOGRAM (TEE);  Surgeon: Chilton Si, MD;  Location: [facility] ENDOSCOPY;  Service: Cardiovascular;  Laterality: N/A;   TEE WITHOUT CARDIOVERSION N/A 11/23/2021   Procedure: TRANSESOPHAGEAL ECHOCARDIOGRAM (TEE);  Surgeon: Elease Hashimoto Deloris Ping, MD;  Location: [facility] ENDOSCOPY;  Service: Cardiovascular;  Laterality: N/A;    Allergies  Allergies  Allergen Reactions   Omeprazole     doesn't work well with plavix    Other Palpitations    Certain steroids give patient heart palpitations  Allergy to green peppers    Home Medications    Prior to Admission medications   Medication Sig Start Date End Date Taking? Authorizing Provider  amiodarone (PACERONE) 200 MG tablet TAKE ONE HALF (1/2) TABLET BY MOUTH DAILY 04/12/23   Graciella Freer, PA-C  atorvastatin (LIPITOR) 80 MG tablet Take 80 mg by mouth daily.    [provider]  carvedilol (COREG) 3.125 MG tablet TAKE ONE TABLET BY MOUTH TWICE A DAY 02/11/23   Graciella Freer, PA-C  Cholecalciferol (VITAMIN D) 50 MCG (2000 UT) CAPS Take 4,000 Units by mouth daily.    [provider]  DULoxetine (CYMBALTA) 20 MG capsule Take 1 capsule (20 mg total) by mouth daily. 08/31/23   Judi Saa, DO  ELIQUIS 5 MG TABS tablet TAKE ONE TABLET BY MOUTH TWICE A DAY 07/18/23   Runell Gess, MD  finasteride (PROSCAR) 5 MG tablet Take 1 tablet (5 mg total) by mouth daily. 12/29/18   Stoioff, Verna Czech, MD  gabapentin (NEURONTIN) 300 MG capsule Take 300 mg by mouth in the morning, at noon, in the evening, and at bedtime. 12/16/20   [provider]  isosorbide mononitrate (IMDUR) 60 MG 24 hr tablet TAKE ONE AND ONE HALF (1/2) TABLETS BY MOUTH DAILY 11/15/23   Runell Gess, MD  lansoprazole (PREVACID) 30 MG capsule Take 30 mg daily to every other day as  needed 09/12/23   Armbruster, Willaim Rayas, MD  levothyroxine (SYNTHROID) 75 MCG tablet Take 75 mcg by mouth daily before breakfast.    [provider]  methylcellulose (CITRUCEL) oral powder Take 1 packet by mouth daily. 09/12/23   Armbruster, Willaim Rayas, MD  Multiple Vitamin (MULTIVITAMIN) tablet Take 1 tablet by mouth daily.    [provider]  nitroGLYCERIN (NITROSTAT) 0.4 MG SL tablet PLACE ONE TABLET BY MOUTH UNDER THE TONGUE AS NEEDED FOR CHEST PAIN 09/20/23   Runell Gess, MD  potassium chloride (KLOR-CON) 10 MEQ tablet TAKE TWO TABLETS BY MOUTH TWICE A DAY 08/03/23   Runell Gess, MD  torsemide (DEMADEX) 20 MG tablet Take 1 tablet (20 mg total) by mouth 2 (two) times daily. 01/05/23   Graciella Freer, PA-C  traZODone (DESYREL) 50 MG tablet Take 1 tablet by mouth at bedtime. 06/10/22   [provider]  vitamin B-12 (CYANOCOBALAMIN) 1000 MCG tablet Take 1,000 mcg by mouth daily.    [provider]  vitamin C (ASCORBIC ACID) 500 MG tablet Take 500 mg by mouth daily.  [provider]    Physical Exam    Vital Signs:  Gregory Shepherd does not have vital signs available for review today.  Given telephonic nature of communication, physical exam is limited. AAOx3. NAD. Normal affect.  Speech and respirations are unlabored.  Accessory Clinical Findings    None  Assessment & Plan    1.  Preoperative Cardiovascular Risk Assessment:  According to the Revised Cardiac Risk Index (RCRI), his Perioperative Risk of Major Cardiac Event is (%): 0.9  His Functional Capacity in METs is: 7.25 according to the [facility] Activity Status Index (DASI).   The patient was advised that if he develops new symptoms prior to surgery to contact our office to arrange for a follow-up visit, and he verbalized understanding.  Per office protocol, patient can hold Eliquis for 3 days prior to procedure.   Patient will not need bridging with Lovenox (enoxaparin)  around procedure.  Therefore, based on ACC/AHA guidelines, patient would be at acceptable risk for the planned procedure without further cardiovascular testing. I will route this recommendation to the requesting party via Epic fax function.   I have explained to the patient's wife that this clearance is only for the nerve root injection and not for carpal tunnel repair.  A separate request from the surgeon who is planning on doing the carpal tunnel repair will be required in order for [place] to clear him as this is set for a later date.  A copy of this note will be routed to requesting surgeon.  Time:   Today, I have spent 12 minutes with the patient with telehealth technology discussing medical history, symptoms, and management plan.     Joni Reining, NP  11/18/2023, 2:01 PM

## 2023-11-18 ENCOUNTER — Ambulatory Visit: Payer: Medicare Other | Attending: Cardiology

## 2023-11-18 DIAGNOSIS — Z01810 Encounter for preprocedural cardiovascular examination: Secondary | ICD-10-CM | POA: Diagnosis not present

## 2023-11-18 DIAGNOSIS — Z01818 Encounter for other preprocedural examination: Secondary | ICD-10-CM

## 2023-12-01 NOTE — Discharge Instructions (Signed)
Post Procedure Spinal Discharge Instruction Sheet  You may resume a regular diet and any medications that you routinely take (including pain medications) unless otherwise noted by MD.  No driving day of procedure.  Light activity throughout the rest of the day.  Do not do any strenuous work, exercise, bending or lifting.  The day following the procedure, you can resume normal physical activity but you should refrain from exercising or physical therapy for at least three days thereafter.  You may apply ice to the injection site, 20 minutes on, 20 minutes off, as needed. Do not apply ice directly to skin.    Common Side Effects:  Headaches- take your usual medications as directed by your physician.  Increase your fluid intake.  Caffeinated beverages may be helpful.  Lie flat in bed until your headache resolves.  Restlessness or inability to sleep- you may have trouble sleeping for the next few days.  Ask your referring physician if you need any medication for sleep.  Facial flushing or redness- should subside within a few days.  Increased pain- a temporary increase in pain a day or two following your procedure is not unusual.  Take your pain medication as prescribed by your referring physician.  Leg cramps  Please contact our office at [phone] for the following symptoms: Fever greater than 100 degrees. Headaches unresolved with medication after 2-3 days. Increased swelling, pain, or redness at injection site.   Thank you for visiting [facility] today.   YOU MAY RESUME YOUR ELIQUIS IN 48 HOURS.

## 2023-12-02 ENCOUNTER — Other Ambulatory Visit: Payer: Self-pay | Admitting: Cardiovascular Disease

## 2023-12-02 ENCOUNTER — Ambulatory Visit
Admission: RE | Admit: 2023-12-02 | Discharge: 2023-12-02 | Disposition: A | Discharge status: Home / Self Care | Payer: Medicare Other | Source: Ambulatory Visit | Attending: Family Medicine | Admitting: Family Medicine

## 2023-12-02 ENCOUNTER — Other Ambulatory Visit: Payer: Self-pay | Admitting: Family Medicine

## 2023-12-02 DIAGNOSIS — M5416 Radiculopathy, lumbar region: Secondary | ICD-10-CM

## 2023-12-02 DIAGNOSIS — R072 Precordial pain: Secondary | ICD-10-CM

## 2023-12-02 MED ORDER — METHYLPREDNISOLONE ACETATE 40 MG/ML INJ SUSP (RADIOLOG
80.0000 mg | Freq: Once | INTRAMUSCULAR | Status: AC
  Administered 2023-12-02: 80 mg via EPIDURAL

## 2023-12-02 MED ORDER — IOPAMIDOL (ISOVUE-M 200) INJECTION 41%
1.0000 mL | Freq: Once | INTRAMUSCULAR | Status: AC
  Administered 2023-12-02: 1 mL via EPIDURAL

## 2023-12-14 ENCOUNTER — Telehealth: Payer: Medicare Other

## 2023-12-29 ENCOUNTER — Ambulatory Visit: Payer: Medicare Other | Admitting: Family Medicine

## 2024-01-03 ENCOUNTER — Other Ambulatory Visit: Payer: Self-pay | Admitting: Cardiovascular Disease

## 2024-01-03 DIAGNOSIS — I4819 Other persistent atrial fibrillation: Secondary | ICD-10-CM

## 2024-01-03 NOTE — Telephone Encounter (Signed)
Prescription refill request for Eliquis received. Indication: AF Last office visit: 07/25/23  A Tillery PA-C Scr: 1.0 on 12/21/23  Epic Age: 78 Weight: 82.6kg  Based on above findings Eliquis 5mg  twice daily is the appropriate dose.  Refill approved.

## 2024-01-14 IMAGING — DX DG CERVICAL SPINE 2 OR 3 VIEWS
4 series · 4 of 4 positions shown · non-contrast
Comparison: None.

CLINICAL DATA: cervical spine pain

EXAM:
CERVICAL SPINE - 2-3 VIEW

[c-spine lat]
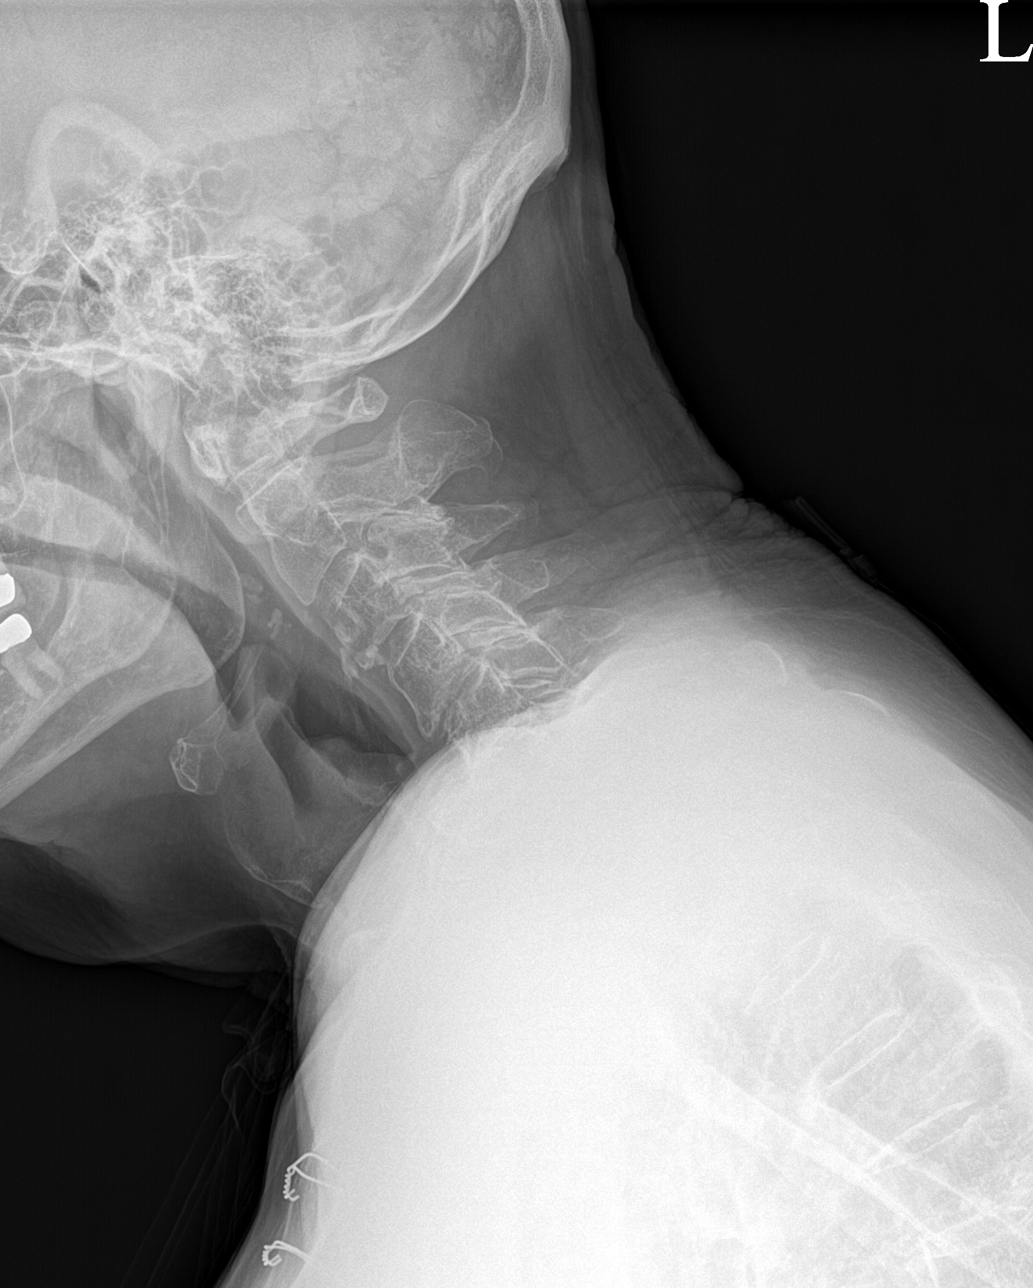

[c-spine ap]
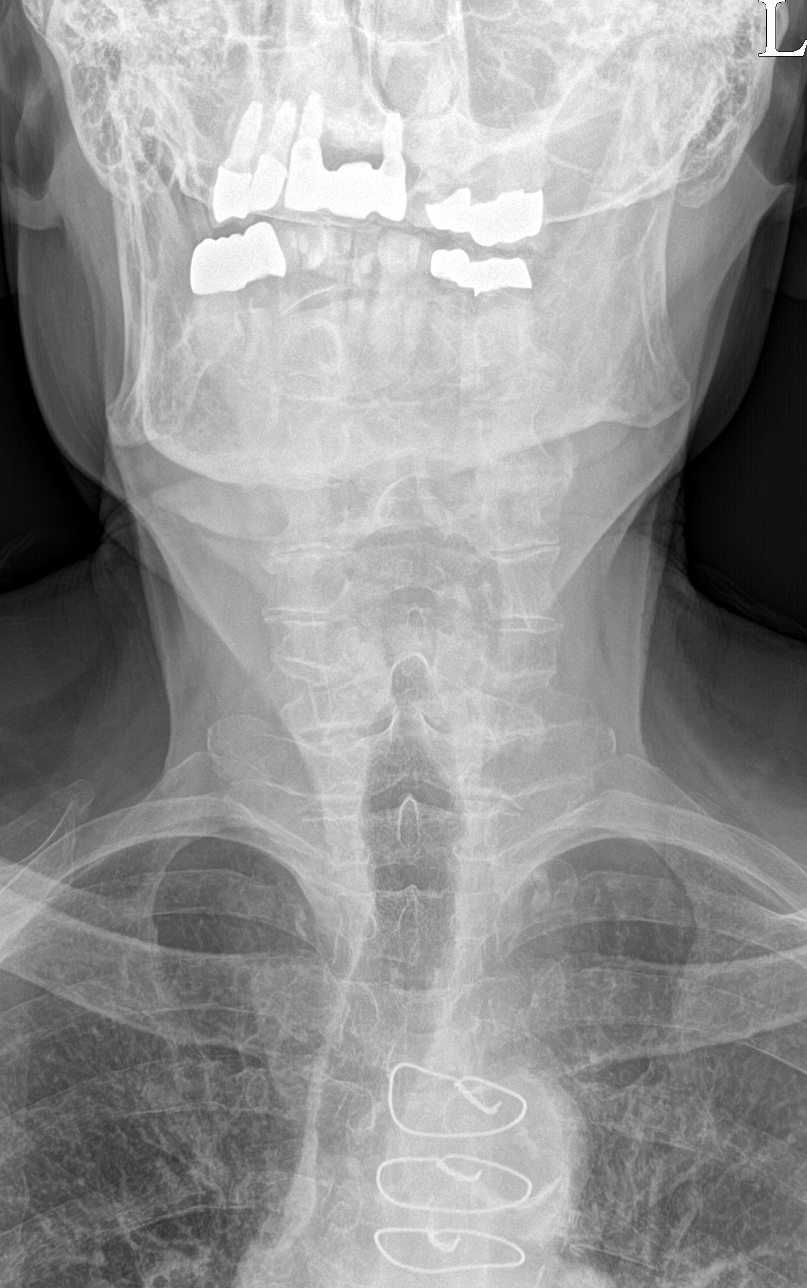

[c-spine open mouth]
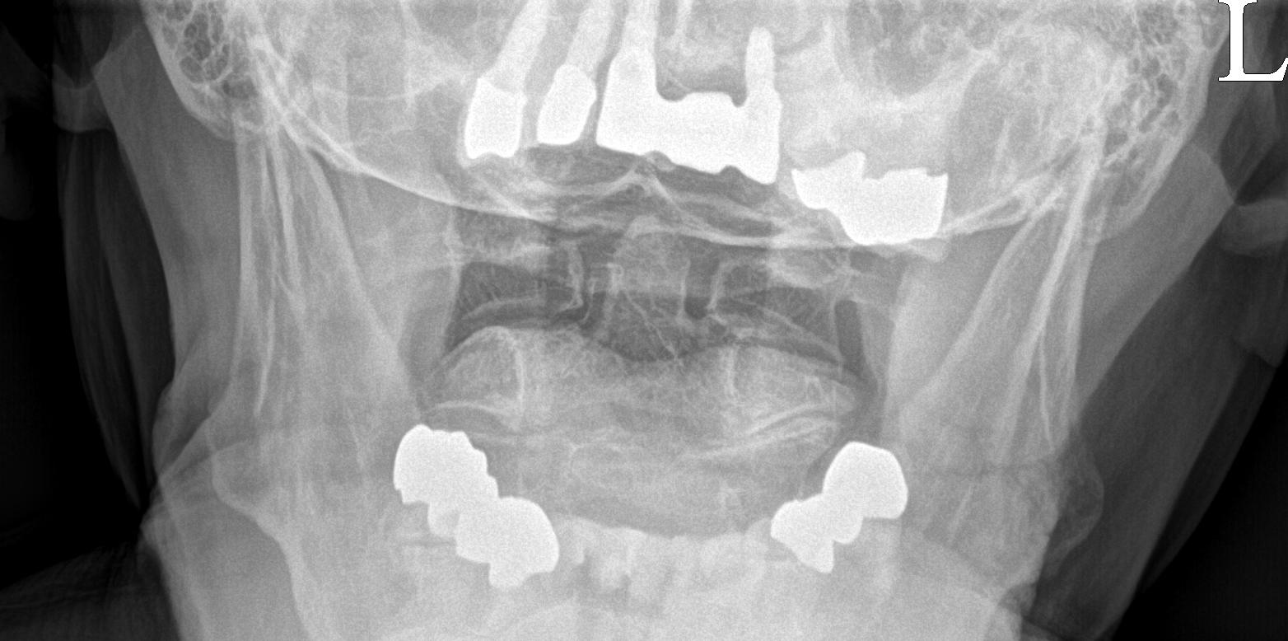

[ct-spine swimmers]
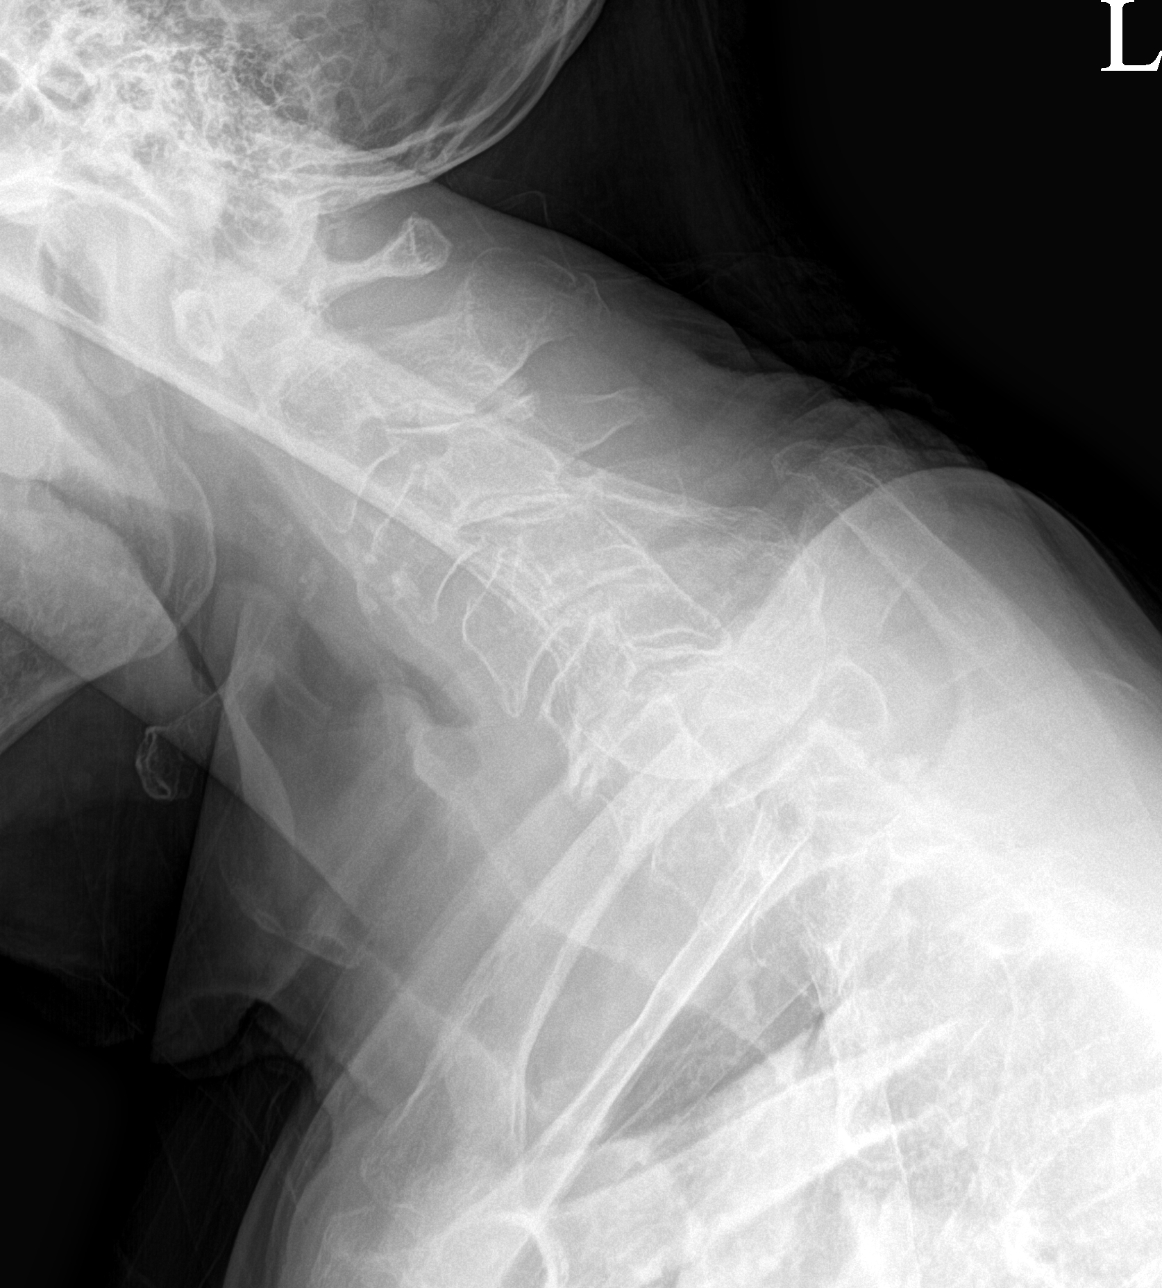

[4 of 4 positions shown; findings below may reference images not displayed]

FINDINGS: On the lateral view the cervical spine is visualized to the level of
C4. Straightening of the normal cervical lordosis likely due to
positioning and degenerative changes. Alignment is otherwise normal.

Dens is well positioned between the lateral masses of C1. There is
limited evaluation of the dens for acute fracture on the open-mouth
view due to overlying osseous structures.

Multilevel degenerative changes spine most prominent at the C5-C6
levels. No acute displaced fracture is detected.No
aggressive-appearing focal osseous lesions.

Pre-vertebral soft tissues are within normal limits.
IMPRESSION: No acute displaced fracture or traumatic listhesis of the cervical
spine. Limited evaluation due to overlapping osseous structures and
overlying soft tissues.

## 2024-01-14 IMAGING — DX DG LUMBAR SPINE 2-3V
3 series · 3 of 3 positions shown · non-contrast
Comparison: MRI lumbar spine 02/16/2019

CLINICAL DATA: lumbar spine pain. LBP x 2-3 months and worsening.
NKI.

EXAM:
LUMBAR SPINE - 2-3 VIEW

[l-spine ap]
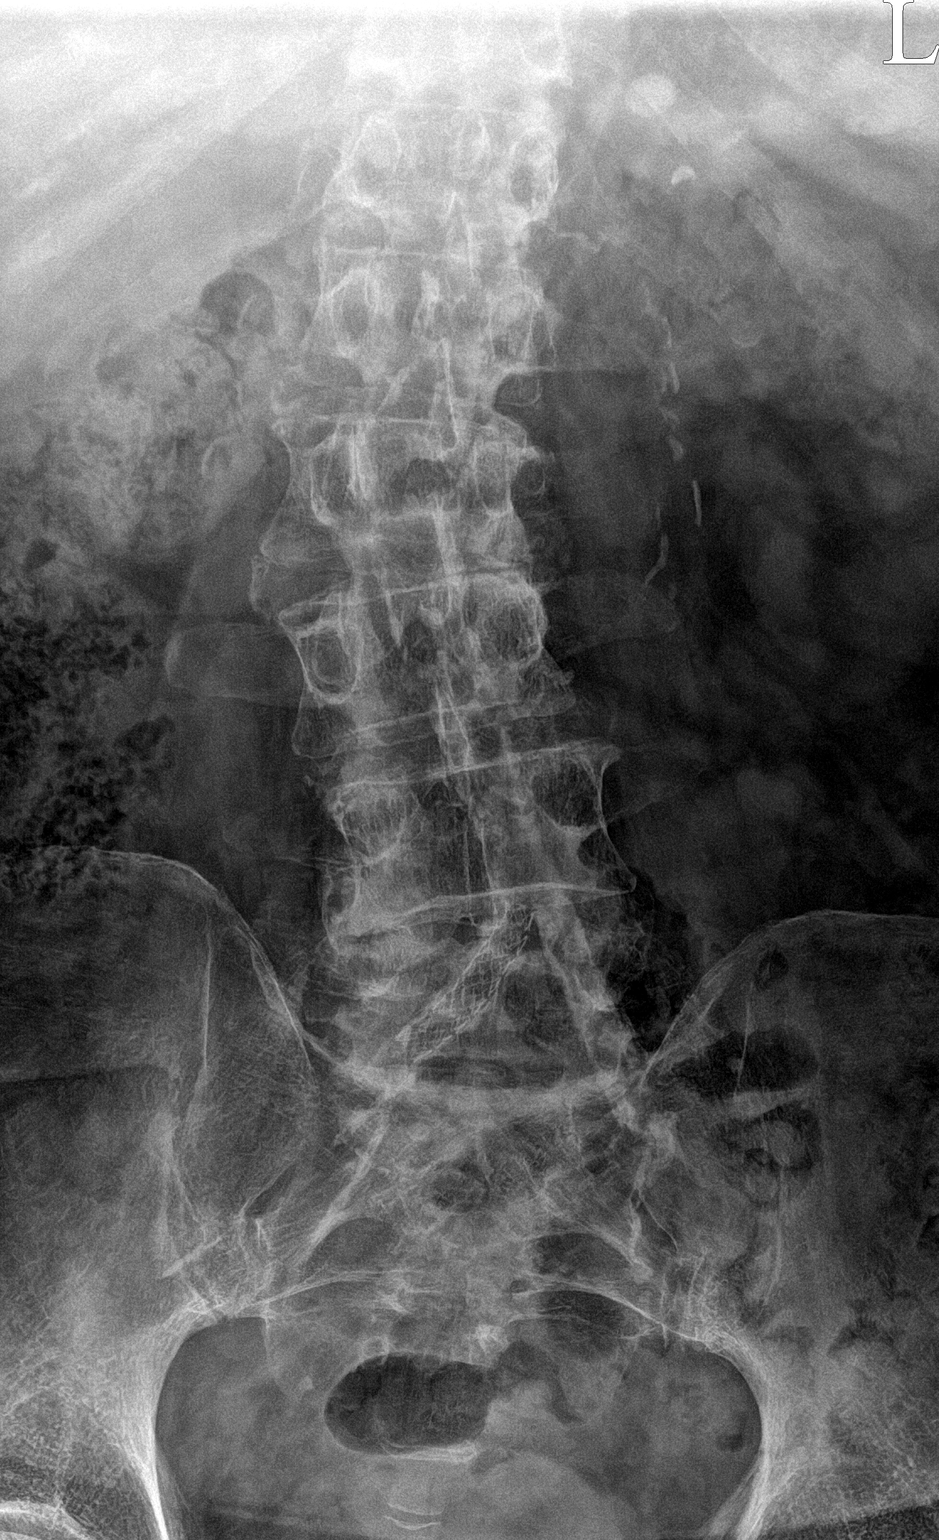

[l-spine lateral]
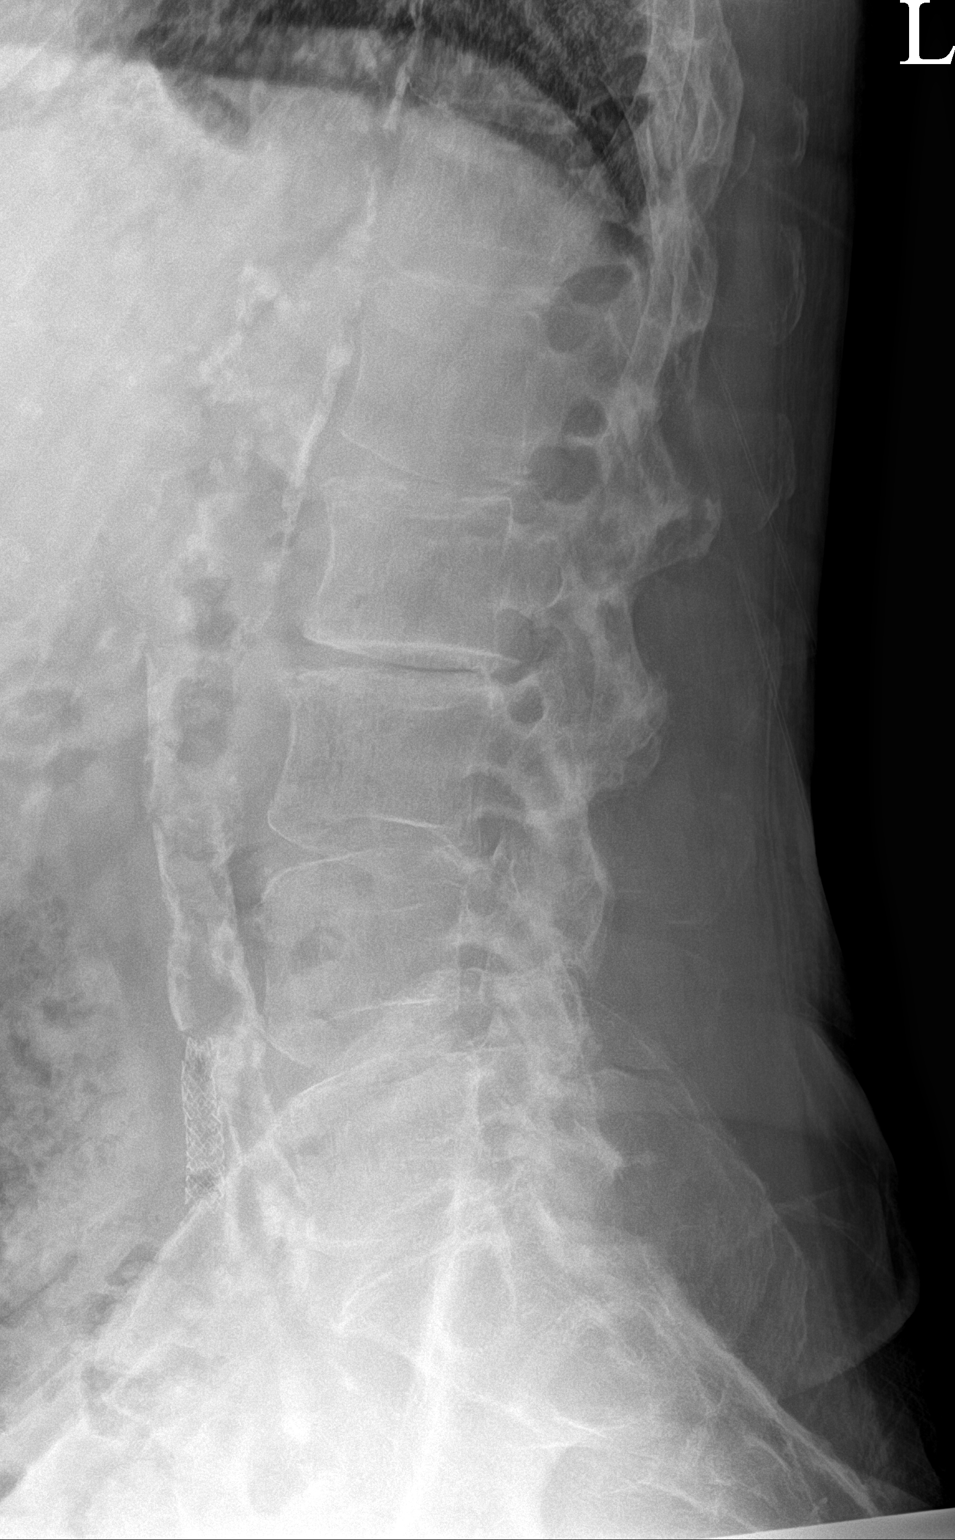

[l-spine spot]
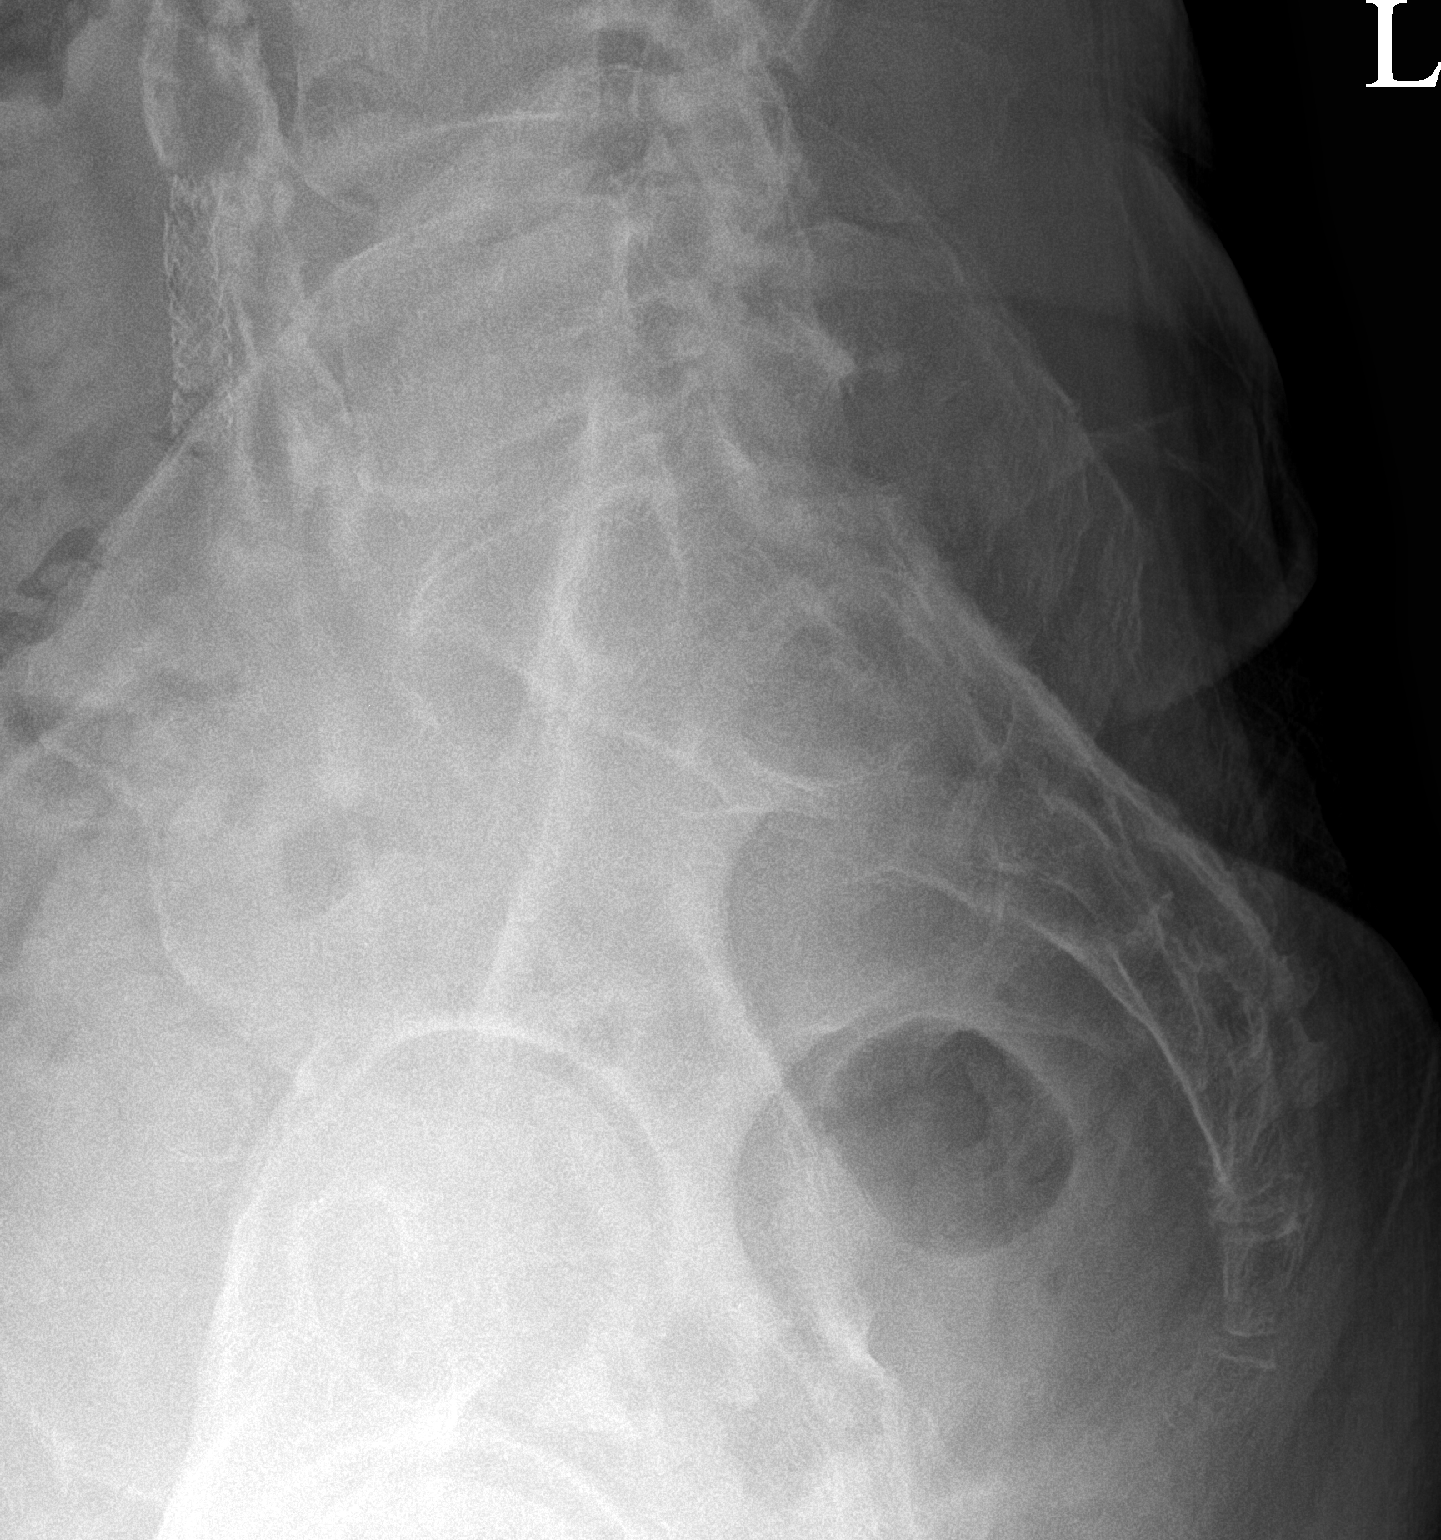

[3 of 3 positions shown; findings below may reference images not displayed]

FINDINGS: Limited evaluation due to overlapping osseous structures and
overlying soft tissues.

Five non-rib-bearing lumbar vertebral bodies.

There is no evidence of lumbar spine fracture. Levocurvature of the
lumbar spine centered at the L2-L3 levels with associated
degenerative changes. Associated multilevel osseous neural foraminal
stenosis with limited evaluation on this radiograph.

Right common iliac vascular stent.  Severe atherosclerotic plaque.

Left upper quadrant rounded calcification and redemonstration of a
semilunar calcification of unclear etiology. These do not overlie
the expected region of the renal shadow.
IMPRESSION: 1. No acute displaced fracture or traumatic listhesis of the lumbar
spine in a patient with the low curvature lumbar spine associated
degenerative changes. Associated multilevel osseous neural foraminal
stenosis with limited evaluation on this radiograph.
2. Aortic Atherosclerosis ([id number]-URE.E) -severe with a common iliac
stents on the right.

## 2024-01-16 ENCOUNTER — Encounter: Payer: Self-pay | Admitting: Cardiovascular Disease

## 2024-01-16 ENCOUNTER — Ambulatory Visit: Payer: Medicare Other | Attending: Cardiovascular Disease | Admitting: Cardiovascular Disease

## 2024-01-16 VITALS — BP 126/75 | HR 69 | Ht 72.0 in | Wt 173.4 lb

## 2024-01-16 DIAGNOSIS — I10 Essential (primary) hypertension: Secondary | ICD-10-CM | POA: Diagnosis not present

## 2024-01-16 DIAGNOSIS — I739 Peripheral vascular disease, unspecified: Secondary | ICD-10-CM

## 2024-01-16 DIAGNOSIS — I4819 Other persistent atrial fibrillation: Secondary | ICD-10-CM | POA: Diagnosis not present

## 2024-01-16 DIAGNOSIS — Z951 Presence of aortocoronary bypass graft: Secondary | ICD-10-CM | POA: Diagnosis not present

## 2024-01-16 DIAGNOSIS — E782 Mixed hyperlipidemia: Secondary | ICD-10-CM | POA: Diagnosis not present

## 2024-01-16 DIAGNOSIS — Z95828 Presence of other vascular implants and grafts: Secondary | ICD-10-CM

## 2024-01-16 NOTE — Progress Notes (Signed)
 01/16/2024 Gregory Shepherd   1946/09/04  [id number]  Primary Physician Lauro Regulus, MD Primary Cardiologist: Runell Gess MD FACP, [place], [place], [place]  HPI:  Gregory Shepherd is a 78 y.o.   mild to moderately overweight married Caucasian male father of 2, grandfather of 3 grandchildren who I last saw in the office 01/11/2023.  He is accompanied by his wife [name] today.  He was referred by Dr. Ayesha Mohair for peripheral vascular valuation because of claudication.  He is retired from being in [employer].  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  He has treated hypertension hyperlipidemia.  His father did have a myocardial infarction.  He had CABG x2 at [facility] in 1990 by Dr. Sheppard Penton.  He did have a remote MI at age 27 as well.  He had redo bypass surgery x3 at [facility] 8 years later.       His major issue is with left lower extremity numbness and claudication.  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  He has high-frequency signals in both iliac arteries and monophasic waveforms below that on the right.   We proceeded with lower extremity angiography and intervention in 06/04/2019 revealing a 99% calcified ostial/proximal right common iliac artery stenosis and a 60 to 70% left common iliac artery stenosis.  Performed diamondback orbital rotational atherectomy, PTA and stenting with an excellent result.  His Dopplers normalized and his claudication resolved.   He did undergo DC cardioversion at the end of December 2022 after being put on amiodarone.  He has maintained sinus rhythm/sinus bradycardia on amiodarone, low-dose carvedilol and Eliquis.  He was seen in the office by Dr. Bjorn Pippin 08/06/2022 with hypertension but apparently this was an artifact of his wrist blood pressure monitor since his blood  pressures were normal with a cuff blood pressure monitor.  He was placed on losartan which he stopped because of hypotension.   He was complaining of nocturnal angina in the late evening hours which has occasionally awaken him from sleep.  This is relieved with 1 sublingual nitroglycerin.  He does not get chest pain while walking or during exertion.  Otherwise he does not have symptoms of heart failure and has maintained sinus rhythm.  He complains of some numbness in his right leg which sounds neurogenic but denies claudication or symptoms similar to he had prior to intervention 3 years ago.  His Dopplers done February of this year showed continued patency of his right iliac stent with normal ABIs.   Since I saw him in the office a year ago I did uptitrate his Imdur from 60 to 90 mg a day.  He is now having nocturnal chest pain.  He also has some pain in his right lower extremity.  He had Doppler studies performed a year ago that showed a high-frequency signal in his right common iliac artery.   Current Meds  Medication Sig   amiodarone (PACERONE) 200 MG tablet TAKE ONE HALF (1/2) TABLET BY MOUTH DAILY   atorvastatin (LIPITOR) 80 MG tablet Take 80 mg by mouth daily.   carvedilol (COREG) 3.125 MG tablet TAKE ONE TABLET BY MOUTH TWICE A DAY   Cholecalciferol (VITAMIN D) 50 MCG (2000 UT) CAPS Take 4,000 Units by mouth daily.   DULoxetine (CYMBALTA) 20 MG capsule  Take 1 capsule (20 mg total) by mouth daily.   ELIQUIS 5 MG TABS tablet TAKE ONE TABLET BY MOUTH TWICE A DAY   finasteride (PROSCAR) 5 MG tablet Take 1 tablet (5 mg total) by mouth daily.   gabapentin (NEURONTIN) 300 MG capsule Take 300 mg by mouth in the morning, at noon, in the evening, and at bedtime.   isosorbide mononitrate (IMDUR) 60 MG 24 hr tablet TAKE ONE AND ONE HALF (1/2) TABLETS BY MOUTH DAILY   lansoprazole (PREVACID) 30 MG capsule Take 30 mg daily to every other day as needed   levothyroxine (SYNTHROID) 75 MCG tablet Take 75  mcg by mouth daily before breakfast.   Multiple Vitamin (MULTIVITAMIN) tablet Take 1 tablet by mouth daily.   nitroGLYCERIN (NITROSTAT) 0.4 MG SL tablet PLACE ONE TABLET UNDER THE TONGUE AS NEEDED FOR CHEST PAIN   potassium chloride (KLOR-CON) 10 MEQ tablet TAKE TWO TABLETS BY MOUTH TWICE A DAY   torsemide (DEMADEX) 20 MG tablet Take 1 tablet (20 mg total) by mouth 2 (two) times daily.   traZODone (DESYREL) 50 MG tablet Take 1 tablet by mouth at bedtime.   vitamin B-12 (CYANOCOBALAMIN) 1000 MCG tablet Take 1,000 mcg by mouth daily.   vitamin C (ASCORBIC ACID) 500 MG tablet Take 500 mg by mouth daily.     Allergies  Allergen Reactions   Omeprazole     doesn't work well with plavix    Other Palpitations    Certain steroids give patient heart palpitations  Allergy to green peppers    Social History   Socioeconomic History   Marital status: Married    Spouse name: Not on file   Number of children: Not on file   Years of education: Not on file   Highest education level: Not on file  Occupational History   Not on file  Tobacco Use   Smoking status: Every Day    Types: Cigarettes   Smokeless tobacco: Never   Tobacco comments:    8-9 cigarettes per day  Vaping Use   Vaping status: Never Used  Substance and Sexual Activity   Alcohol use: No   Drug use: No   Sexual activity: Not on file  Other Topics Concern   Not on file  Social History Narrative   Not on file   Social Drivers of Health   Financial Resource Strain: Low Risk  (06/22/2023)   Received from [facility]   Overall Financial Resource Strain (CARDIA)    Difficulty of Paying Living Expenses: Not hard at all  Food Insecurity: No Food Insecurity (06/22/2023)   Received from [facility]   Hunger Vital Sign    Worried About Running Out of Food in the Last Year: Never true    Ran Out of Food in the Last Year: Never true  Transportation Needs: No Transportation Needs (06/22/2023)    Received from [facility] - Transportation    In the past 12 months, has lack of transportation kept you from medical appointments or from getting medications?: No    Lack of Transportation (Non-Medical): No  Physical Activity: Not on file  Stress: Not on file  Social Connections: Not on file  Intimate Partner Violence: Not on file     Review of Systems: General: negative for chills, fever, night sweats or weight changes.  Cardiovascular: negative for chest pain, dyspnea on exertion, edema, orthopnea, palpitations, paroxysmal nocturnal dyspnea or shortness of breath Dermatological: negative  for rash Respiratory: negative for cough or wheezing Urologic: negative for hematuria Abdominal: negative for nausea, vomiting, diarrhea, bright red blood per rectum, melena, or hematemesis Neurologic: negative for visual changes, syncope, or dizziness All other systems reviewed and are otherwise negative except as noted above.    Blood pressure 126/75, pulse 69, height 6' (1.829 m), weight 173 lb 6.4 oz (78.7 kg), SpO2 96%.  General appearance: alert and no distress Neck: no adenopathy, no carotid bruit, no JVD, supple, symmetrical, trachea midline, and thyroid not enlarged, symmetric, no tenderness/mass/nodules Lungs: clear to auscultation bilaterally Heart: irregularly irregular rhythm Extremities: extremities normal, atraumatic, no cyanosis or edema Pulses: 2+ and symmetric Skin: Skin color, texture, turgor normal. No rashes or lesions Neurologic: Grossly normal  EKG EKG Interpretation Date/Time:  Monday January 16 2024 15:01:06 EST Ventricular Rate:  72 PR Interval:    QRS Duration:  152 QT Interval:  458 QTC Calculation: 501 R Axis:   -50  Text Interpretation: Atrial fibrillation with premature ventricular or aberrantly conducted complexes Left axis deviation Right bundle branch block Inferior infarct , age undetermined When compared with ECG of  18-Jul-2023 13:41, Atrial fibrillation has replaced Sinus rhythm Criteria for Septal infarct are no longer Present Inferior infarct is now Present Confirmed by Nanetta Batty [phone]) on 01/16/2024 3:06:36 PM    ASSESSMENT AND PLAN:   Hyperlipemia History of hyperlipidemia on high-dose statin therapy with lipid profile followed by his PCP.  His most recent lipid profile performed 3 weeks ago revealed total cholesterol 97, LDL 39 and HDL 37.  Hx of CABG History of CAD status post bypass grafting x 2 [facility] by Dr. Sheppard Penton in 1990.  He had redo x 3 8 years later.  He does complain of nocturnal chest pain which originally improved with titration of his Imdur but now has recurred.  He had a Myoview performed 10/08/2022 that revealed scar without ischemia.  I am going to obtain a cardiac PET to further evaluate.  Essential (primary) hypertension History of essential hypertension blood pressure measured today at 126/75.  He is on carvedilol.  Peripheral arterial disease (HCC) History of PAD status post right common iliac artery intervention by myself using orbital atherectomy, PTA and stenting 06/04/2019.  He did have a 60 to 70% left common iliac artery stenosis but was not having left-sided symptoms.  He now complains of right-sided symptoms and his most recent Doppler studies performed 02/07/23 revealed a high-frequency signal in his right common iliac artery.  I am going to get a CTA to further evaluate.  He may require reintervention for lifestyle-limiting claudication.  Persistent atrial fibrillation (HCC) History of PAF status post cardioversion December 2022 after being put on amiodarone which he remains on as well as Eliquis.  Clinically he seems to be out of rhythm today.     Runell Gess MD FACP,FACC,FAHA, [facility] 01/16/2024 3:07 PM

## 2024-01-16 NOTE — H&P (View-Only) (Signed)
 01/16/2024 Gregory Shepherd   1946/09/04  [id number]  Primary Physician Lauro Regulus, MD Primary Cardiologist: Runell Gess MD FACP, [place], [place], [place]  HPI:  Gregory Shepherd is a 78 y.o.   mild to moderately overweight married Caucasian male father of 2, grandfather of 3 grandchildren who I last saw in the office 01/11/2023.  He is accompanied by his wife Gregory Shepherd today.  He was referred by Dr. Ayesha Mohair for peripheral vascular valuation because of claudication.  He is retired from being in [employer].  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  He has treated hypertension hyperlipidemia.  His father did have a myocardial infarction.  He had CABG x2 at [facility] in 1990 by Dr. Sheppard Penton.  He did have a remote MI at age 27 as well.  He had redo bypass surgery x3 at [facility] 8 years later.       His major issue is with left lower extremity numbness and claudication.  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  He has high-frequency signals in both iliac arteries and monophasic waveforms below that on the right.   We proceeded with lower extremity angiography and intervention in 06/04/2019 revealing a 99% calcified ostial/proximal right common iliac artery stenosis and a 60 to 70% left common iliac artery stenosis.  Performed diamondback orbital rotational atherectomy, PTA and stenting with an excellent result.  His Dopplers normalized and his claudication resolved.   He did undergo DC cardioversion at the end of December 2022 after being put on amiodarone.  He has maintained sinus rhythm/sinus bradycardia on amiodarone, low-dose carvedilol and Eliquis.  He was seen in the office by Dr. Bjorn Pippin 08/06/2022 with hypertension but apparently this was an artifact of his wrist blood pressure monitor since his blood  pressures were normal with a cuff blood pressure monitor.  He was placed on losartan which he stopped because of hypotension.   He was complaining of nocturnal angina in the late evening hours which has occasionally awaken him from sleep.  This is relieved with 1 sublingual nitroglycerin.  He does not get chest pain while walking or during exertion.  Otherwise he does not have symptoms of heart failure and has maintained sinus rhythm.  He complains of some numbness in his right leg which sounds neurogenic but denies claudication or symptoms similar to he had prior to intervention 3 years ago.  His Dopplers done February of this year showed continued patency of his right iliac stent with normal ABIs.   Since I saw him in the office a year ago I did uptitrate his Imdur from 60 to 90 mg a day.  He is now having nocturnal chest pain.  He also has some pain in his right lower extremity.  He had Doppler studies performed a year ago that showed a high-frequency signal in his right common iliac artery.   Current Meds  Medication Sig   amiodarone (PACERONE) 200 MG tablet TAKE ONE HALF (1/2) TABLET BY MOUTH DAILY   atorvastatin (LIPITOR) 80 MG tablet Take 80 mg by mouth daily.   carvedilol (COREG) 3.125 MG tablet TAKE ONE TABLET BY MOUTH TWICE A DAY   Cholecalciferol (VITAMIN D) 50 MCG (2000 UT) CAPS Take 4,000 Units by mouth daily.   DULoxetine (CYMBALTA) 20 MG capsule  Take 1 capsule (20 mg total) by mouth daily.   ELIQUIS 5 MG TABS tablet TAKE ONE TABLET BY MOUTH TWICE A DAY   finasteride (PROSCAR) 5 MG tablet Take 1 tablet (5 mg total) by mouth daily.   gabapentin (NEURONTIN) 300 MG capsule Take 300 mg by mouth in the morning, at noon, in the evening, and at bedtime.   isosorbide mononitrate (IMDUR) 60 MG 24 hr tablet TAKE ONE AND ONE HALF (1/2) TABLETS BY MOUTH DAILY   lansoprazole (PREVACID) 30 MG capsule Take 30 mg daily to every other day as needed   levothyroxine (SYNTHROID) 75 MCG tablet Take 75  mcg by mouth daily before breakfast.   Multiple Vitamin (MULTIVITAMIN) tablet Take 1 tablet by mouth daily.   nitroGLYCERIN (NITROSTAT) 0.4 MG SL tablet PLACE ONE TABLET UNDER THE TONGUE AS NEEDED FOR CHEST PAIN   potassium chloride (KLOR-CON) 10 MEQ tablet TAKE TWO TABLETS BY MOUTH TWICE A DAY   torsemide (DEMADEX) 20 MG tablet Take 1 tablet (20 mg total) by mouth 2 (two) times daily.   traZODone (DESYREL) 50 MG tablet Take 1 tablet by mouth at bedtime.   vitamin B-12 (CYANOCOBALAMIN) 1000 MCG tablet Take 1,000 mcg by mouth daily.   vitamin C (ASCORBIC ACID) 500 MG tablet Take 500 mg by mouth daily.     Allergies  Allergen Reactions   Omeprazole     doesn't work well with plavix    Other Palpitations    Certain steroids give patient heart palpitations  Allergy to green peppers    Social History   Socioeconomic History   Marital status: Married    Spouse name: Not on file   Number of children: Not on file   Years of education: Not on file   Highest education level: Not on file  Occupational History   Not on file  Tobacco Use   Smoking status: Every Day    Types: Cigarettes   Smokeless tobacco: Never   Tobacco comments:    8-9 cigarettes per day  Vaping Use   Vaping status: Never Used  Substance and Sexual Activity   Alcohol use: No   Drug use: No   Sexual activity: Not on file  Other Topics Concern   Not on file  Social History Narrative   Not on file   Social Drivers of Health   Financial Resource Strain: Low Risk  (06/22/2023)   Received from [facility]   Overall Financial Resource Strain (CARDIA)    Difficulty of Paying Living Expenses: Not hard at all  Food Insecurity: No Food Insecurity (06/22/2023)   Received from [facility]   Hunger Vital Sign    Worried About Running Out of Food in the Last Year: Never true    Ran Out of Food in the Last Year: Never true  Transportation Needs: No Transportation Needs (06/22/2023)    Received from [facility] - Transportation    In the past 12 months, has lack of transportation kept you from medical appointments or from getting medications?: No    Lack of Transportation (Non-Medical): No  Physical Activity: Not on file  Stress: Not on file  Social Connections: Not on file  Intimate Partner Violence: Not on file     Review of Systems: General: negative for chills, fever, night sweats or weight changes.  Cardiovascular: negative for chest pain, dyspnea on exertion, edema, orthopnea, palpitations, paroxysmal nocturnal dyspnea or shortness of breath Dermatological: negative  for rash Respiratory: negative for cough or wheezing Urologic: negative for hematuria Abdominal: negative for nausea, vomiting, diarrhea, bright red blood per rectum, melena, or hematemesis Neurologic: negative for visual changes, syncope, or dizziness All other systems reviewed and are otherwise negative except as noted above.    Blood pressure 126/75, pulse 69, height 6' (1.829 m), weight 173 lb 6.4 oz (78.7 kg), SpO2 96%.  General appearance: alert and no distress Neck: no adenopathy, no carotid bruit, no JVD, supple, symmetrical, trachea midline, and thyroid not enlarged, symmetric, no tenderness/mass/nodules Lungs: clear to auscultation bilaterally Heart: irregularly irregular rhythm Extremities: extremities normal, atraumatic, no cyanosis or edema Pulses: 2+ and symmetric Skin: Skin color, texture, turgor normal. No rashes or lesions Neurologic: Grossly normal  EKG EKG Interpretation Date/Time:  Monday January 16 2024 15:01:06 EST Ventricular Rate:  72 PR Interval:    QRS Duration:  152 QT Interval:  458 QTC Calculation: 501 R Axis:   -50  Text Interpretation: Atrial fibrillation with premature ventricular or aberrantly conducted complexes Left axis deviation Right bundle branch block Inferior infarct , age undetermined When compared with ECG of  18-Jul-2023 13:41, Atrial fibrillation has replaced Sinus rhythm Criteria for Septal infarct are no longer Present Inferior infarct is now Present Confirmed by Nanetta Batty [phone]) on 01/16/2024 3:06:36 PM    ASSESSMENT AND PLAN:   Hyperlipemia History of hyperlipidemia on high-dose statin therapy with lipid profile followed by his PCP.  His most recent lipid profile performed 3 weeks ago revealed total cholesterol 97, LDL 39 and HDL 37.  Hx of CABG History of CAD status post bypass grafting x 2 [facility] by Dr. Sheppard Penton in 1990.  He had redo x 3 8 years later.  He does complain of nocturnal chest pain which originally improved with titration of his Imdur but now has recurred.  He had a Myoview performed 10/08/2022 that revealed scar without ischemia.  I am going to obtain a cardiac PET to further evaluate.  Essential (primary) hypertension History of essential hypertension blood pressure measured today at 126/75.  He is on carvedilol.  Peripheral arterial disease (HCC) History of PAD status post right common iliac artery intervention by myself using orbital atherectomy, PTA and stenting 06/04/2019.  He did have a 60 to 70% left common iliac artery stenosis but was not having left-sided symptoms.  He now complains of right-sided symptoms and his most recent Doppler studies performed 02/07/23 revealed a high-frequency signal in his right common iliac artery.  I am going to get a CTA to further evaluate.  He may require reintervention for lifestyle-limiting claudication.  Persistent atrial fibrillation (HCC) History of PAF status post cardioversion December 2022 after being put on amiodarone which he remains on as well as Eliquis.  Clinically he seems to be out of rhythm today.     Runell Gess MD FACP,FACC,FAHA, [facility] 01/16/2024 3:07 PM

## 2024-01-16 NOTE — Assessment & Plan Note (Signed)
 History of CAD status post bypass grafting x 2 [facility] by Dr. Sullivan Endow in 1990.  He had redo x 3 8 years later.  He does complain of nocturnal chest pain which originally improved with titration of his Imdur  but now has recurred.  He had a Myoview  performed 10/08/2022 that revealed scar without ischemia.  I am going to obtain a cardiac PET to further evaluate.

## 2024-01-16 NOTE — Assessment & Plan Note (Signed)
 History of essential hypertension blood pressure measured today at 126/75.  He is on carvedilol .

## 2024-01-16 NOTE — Assessment & Plan Note (Signed)
 History of PAF status post cardioversion December 2022 after being put on amiodarone  which he remains on as well as Eliquis .  Clinically he seems to be out of rhythm today.

## 2024-01-16 NOTE — Patient Instructions (Addendum)
 Medication Instructions:  Your physician recommends that you continue on your current medications as directed. Please refer to the Current Medication list given to you today.  *If you need a refill on your cardiac medications before your next appointment, please call your pharmacy*   Testing/Procedures: Non-Cardiac CT Angiography (CTA) abdomen/pelvis, is a special type of CT scan that uses a computer to produce multi-dimensional views of major blood vessels throughout the body. In CT angiography, a contrast material is injected through an IV to help visualize the blood vessels    Follow-Up: At [facility], you and your health needs are our priority.  As part of our continuing mission to provide you with exceptional heart care, we have created designated Provider Care Teams.  These Care Teams include your primary Cardiologist (physician) and Advanced Practice Providers (APPs -  Physician Assistants and Nurse Practitioners) who all work together to provide you with the care you need, when you need it.  We recommend signing up for the patient portal called "MyChart".  Sign up information is provided on this After Visit Summary.  MyChart is used to connect with patients for Virtual Visits (Telemedicine).  Patients are able to view lab/test results, encounter notes, upcoming appointments, etc.  Non-urgent messages can be sent to your provider as well.   To learn more about what you can do with MyChart, go to [email or web address].    Your next appointment:   3 month(s)  Provider:   Lauro Portal, MD     Other Instructions We will get you an appointment to see Dr. Marven Slimmer or APP.     Please report to Radiology at the [facility] Main Entrance 30 minutes early for your test.  [address] [place], [place] [address]                         OR   Please report to Radiology at [facility] Main Entrance, medical mall, 30 mins prior to your  test.  [address]  [place], [place]  How to Prepare for Your Cardiac PET/CT Stress Test:  Nothing to eat or drink, except water, 3 hours prior to arrival time.  NO caffeine/decaffeinated products, or chocolate 12 hours prior to arrival. (Please note decaffeinated beverages (teas/coffees) still contain caffeine).  If you have caffeine within 12 hours prior, the test will need to be rescheduled.  Medication instructions: Do not take erectile dysfunction medications for 72 hours prior to test (sildenafil, tadalafil) Do not take nitrates (isosorbide  mononitrate, Ranexa ) the day before or day of test Do not take tamsulosin  the day before or morning of test Hold theophylline containing medications for 12 hours. Hold Dipyridamole 48 hours prior to the test.  Diabetic Preparation: If able to eat breakfast prior to 3 hour fasting, you may take all medications, including your insulin. Do not worry if you miss your breakfast dose of insulin - start at your next meal. If you do not eat prior to 3 hour fast-Hold all diabetes (oral and insulin) medications. Patients who wear a continuous glucose monitor MUST remove the device prior to scanning.  You may take your remaining medications with water.  NO perfume, cologne or lotion on chest or abdomen area. FEMALES - Please avoid wearing dresses to this appointment.  Total time is 1 to 2 hours; you may want to bring reading material for the waiting time.  IF YOU THINK YOU MAY BE PREGNANT, OR ARE  NURSING PLEASE INFORM THE TECHNOLOGIST.  In preparation for your appointment, medication and supplies will be purchased.  Appointment availability is limited, so if you need to cancel or reschedule, please call the Radiology Department Scheduler at [phone] 24 hours in advance to avoid a cancellation fee of $100.00  What to Expect When you Arrive:  Once you arrive and check in for your appointment, you will be taken to a preparation room within  the Radiology Department.  A technologist or Nurse will obtain your medical history, verify that you are correctly prepped for the exam, and explain the procedure.  Afterwards, an IV will be started in your arm and electrodes will be placed on your skin for EKG monitoring during the stress portion of the exam. Then you will be escorted to the PET/CT scanner.  There, staff will get you positioned on the scanner and obtain a blood pressure and EKG.  During the exam, you will continue to be connected to the EKG and blood pressure machines.  A small, safe amount of a radioactive tracer will be injected in your IV to obtain a series of pictures of your heart along with an injection of a stress agent.    After your Exam:  It is recommended that you eat a meal and drink a caffeinated beverage to counter act any effects of the stress agent.  Drink plenty of fluids for the remainder of the day and urinate frequently for the first couple of hours after the exam.  Your doctor will inform you of your test results within 7-10 business days.  For more information and frequently asked questions, please visit our website: [email or web address]  For questions about your test or how to prepare for your test, please call: Cardiac Imaging Nurse Navigators Office: [phone]

## 2024-01-16 NOTE — Assessment & Plan Note (Signed)
 History of PAD status post right common iliac artery intervention by myself using orbital atherectomy, PTA and stenting 06/04/2019.  He did have a 60 to 70% left common iliac artery stenosis but was not having left-sided symptoms.  He now complains of right-sided symptoms and his most recent Doppler studies performed 02/07/23 revealed a high-frequency signal in his right common iliac artery.  I am going to get a CTA to further evaluate.  He may require reintervention for lifestyle-limiting claudication.

## 2024-01-16 NOTE — Assessment & Plan Note (Addendum)
 History of hyperlipidemia on high-dose statin therapy with lipid profile followed by his PCP.  His most recent lipid profile performed 3 weeks ago revealed total cholesterol 97, LDL 39 and HDL 37.

## 2024-01-18 ENCOUNTER — Encounter: Payer: Self-pay | Admitting: Cardiology

## 2024-01-20 NOTE — Progress Notes (Signed)
===  View-only below this line=== ----- Message ----- From: Richardson Chiquito, RN Sent: 01/20/2024  12:00 AM EST To: Missy Sabins, RN  See lab result note from 10/27/23; needs cbc, ibc, ferritin drawn around 01/28/24. Orders in epic already. Call to remind pt. [place] pt

## 2024-01-20 NOTE — Progress Notes (Signed)
Patient's wife is asked to remind patient to come next week for labwork. She states she will do this.

## 2024-01-24 ENCOUNTER — Ambulatory Visit (HOSPITAL_BASED_OUTPATIENT_CLINIC_OR_DEPARTMENT_OTHER)
Admission: RE | Admit: 2024-01-24 | Discharge: 2024-01-24 | Disposition: A | Discharge status: Home / Self Care | Payer: Medicare Other | Source: Ambulatory Visit | Attending: Cardiovascular Disease | Admitting: Cardiovascular Disease

## 2024-01-24 ENCOUNTER — Ambulatory Visit: Payer: Medicare Other | Admitting: Family Medicine

## 2024-01-24 DIAGNOSIS — I739 Peripheral vascular disease, unspecified: Secondary | ICD-10-CM | POA: Insufficient documentation

## 2024-01-24 DIAGNOSIS — Z95828 Presence of other vascular implants and grafts: Secondary | ICD-10-CM | POA: Diagnosis present

## 2024-01-24 LAB — POCT I-STAT CREATININE: Creatinine, Ser: 1.2 mg/dL (ref 0.61–1.24)

## 2024-01-24 MED ORDER — IOHEXOL 350 MG/ML SOLN
100.0000 mL | Freq: Once | INTRAVENOUS | Status: AC | PRN
  Administered 2024-01-24: 100 mL via INTRAVENOUS

## 2024-01-25 ENCOUNTER — Other Ambulatory Visit: Payer: Self-pay | Admitting: Student

## 2024-01-27 ENCOUNTER — Telehealth: Payer: Self-pay | Admitting: Cardiovascular Disease

## 2024-01-27 ENCOUNTER — Encounter (HOSPITAL_COMMUNITY): Payer: Self-pay

## 2024-01-27 ENCOUNTER — Other Ambulatory Visit: Payer: Self-pay | Admitting: Cardiovascular Disease

## 2024-01-27 DIAGNOSIS — R072 Precordial pain: Secondary | ICD-10-CM

## 2024-01-27 NOTE — Telephone Encounter (Signed)
 Pt's medication was sent to pt's pharmacy as requested. Confirmation received.

## 2024-01-27 NOTE — Telephone Encounter (Signed)
*  STAT* If patient is at the pharmacy, call can be transferred to refill team.   1. Which medications need to be refilled? (please list name of each medication and dose if known)   nitroGLYCERIN (NITROSTAT) 0.4 MG SL tablet   2. Would you like to learn more about the convenience, safety, & potential cost savings by using the [facility]?   3. Are you open to using the [facility] (Type [facility]. ).  4. Which pharmacy/location (including street and city if local pharmacy) is medication to be sent to?  [facility] - [place], [place] - [address]   5. Do they need a 30 day or 90 day supply?   Patient stated he has 2 tablets left.  Patient wants a call back to confirm prescription was sent.

## 2024-01-31 ENCOUNTER — Other Ambulatory Visit (INDEPENDENT_AMBULATORY_CARE_PROVIDER_SITE_OTHER): Payer: Medicare Other

## 2024-01-31 ENCOUNTER — Ambulatory Visit (HOSPITAL_COMMUNITY)
Admission: RE | Admit: 2024-01-31 | Discharge: 2024-01-31 | Disposition: A | Discharge status: Home / Self Care | Payer: Medicare Other | Source: Ambulatory Visit | Attending: Cardiovascular Disease | Admitting: Cardiovascular Disease

## 2024-01-31 DIAGNOSIS — Z951 Presence of aortocoronary bypass graft: Secondary | ICD-10-CM | POA: Diagnosis present

## 2024-01-31 DIAGNOSIS — I2510 Atherosclerotic heart disease of native coronary artery without angina pectoris: Secondary | ICD-10-CM | POA: Diagnosis not present

## 2024-01-31 DIAGNOSIS — I10 Essential (primary) hypertension: Secondary | ICD-10-CM | POA: Insufficient documentation

## 2024-01-31 DIAGNOSIS — D509 Iron deficiency anemia, unspecified: Secondary | ICD-10-CM | POA: Diagnosis not present

## 2024-01-31 DIAGNOSIS — E782 Mixed hyperlipidemia: Secondary | ICD-10-CM | POA: Diagnosis present

## 2024-01-31 LAB — CBC WITH DIFFERENTIAL/PLATELET
Basophils Absolute: 0.1 10*3/uL (ref 0.0–0.1)
Basophils Relative: 1.4 % (ref 0.0–3.0)
Eosinophils Absolute: 0.1 10*3/uL (ref 0.0–0.7)
Eosinophils Relative: 2.3 % (ref 0.0–5.0)
HCT: 43.3 % (ref 39.0–52.0)
Hemoglobin: 14.6 g/dL (ref 13.0–17.0)
Lymphocytes Relative: 12.8 % (ref 12.0–46.0)
Lymphs Abs: 0.8 10*3/uL (ref 0.7–4.0)
MCHC: 33.7 g/dL (ref 30.0–36.0)
MCV: 95.1 fl (ref 78.0–100.0)
Monocytes Absolute: 0.6 10*3/uL (ref 0.1–1.0)
Monocytes Relative: 9.4 % (ref 3.0–12.0)
Neutro Abs: 4.5 10*3/uL (ref 1.4–7.7)
Neutrophils Relative %: 74.1 % (ref 43.0–77.0)
Platelets: 192 10*3/uL (ref 150.0–400.0)
RBC: 4.56 Mil/uL (ref 4.22–5.81)
RDW: 14.5 % (ref 11.5–15.5)
WBC: 6.1 10*3/uL (ref 4.0–10.5)

## 2024-01-31 LAB — NM PET CT CARDIAC PERFUSION MULTI W/ABSOLUTE BLOODFLOW
LV dias vol: 241 mL (ref 62–150)
LV sys vol: 181 mL
MBFR: 1.56
Nuc Rest EF: 25 %
Nuc Stress EF: 19 %
Rest MBF: 0.81 ml/g/min
Rest Nuclear Isotope Dose: 20.9 mCi
ST Depression (mm): 0 mm
Stress MBF: 1.26 ml/g/min
Stress Nuclear Isotope Dose: 21 mCi

## 2024-01-31 LAB — IBC + FERRITIN
Ferritin: 71.9 ng/mL (ref 22.0–322.0)
Iron: 120 ug/dL (ref 42–165)
Saturation Ratios: 47.6 % (ref 20.0–50.0)
TIBC: 252 ug/dL (ref 250.0–450.0)
Transferrin: 180 mg/dL — ABNORMAL LOW (ref 212.0–360.0)

## 2024-01-31 MED ORDER — RUBIDIUM RB82 GENERATOR (RUBYFILL)
21.0300 | PACK | Freq: Once | INTRAVENOUS | Status: AC
Start: 2024-01-31 — End: 2024-01-31
  Administered 2024-01-31: 21.03 via INTRAVENOUS

## 2024-01-31 MED ORDER — RUBIDIUM RB82 GENERATOR (RUBYFILL)
20.9400 | PACK | Freq: Once | INTRAVENOUS | Status: AC
Start: 2024-01-31 — End: 2024-01-31
  Administered 2024-01-31: 20.94 via INTRAVENOUS

## 2024-01-31 MED ORDER — REGADENOSON 0.4 MG/5ML IV SOLN
0.4000 mg | Freq: Once | INTRAVENOUS | Status: AC
  Administered 2024-01-31: 0.4 mg via INTRAVENOUS

## 2024-01-31 MED ORDER — REGADENOSON 0.4 MG/5ML IV SOLN
INTRAVENOUS | Status: AC
  Filled 2024-01-31: qty 5

## 2024-01-31 NOTE — Progress Notes (Signed)
 Called Dr. Eden Emms r/t pt taking  Ntg 1 hour ago for Chest Pain.  States he is still having chest pain rated at 4.  VS WNL.  Per Dr. Eden Emms okay to proceed with [facility]

## 2024-01-31 NOTE — Progress Notes (Signed)
 Pt. Had increased chest pain from a 4/10 to a 6/10 and nausea with lexiscan .  Called Dr Newton Pigg to this.  Per Dr. Eden Emms if he is still having CP he should go to the ED.  Spoke with patient and he refused to go.  Stated he would go home and take his NTG when he gets home.  He hates going to the ED.  He states he has appointment next with the Dr. Allyson Sabal

## 2024-02-01 ENCOUNTER — Telehealth: Payer: Self-pay

## 2024-02-01 NOTE — Telephone Encounter (Signed)
     Cardiac/Peripheral Catheterization   You are scheduled for a Cardiac Catheterization on Monday, March 3 with Dr. Cristal Deer End.  1. Please arrive at the [facility] (Main Entrance A) at [facility]: [address] [place], [place] [address] at 7:00 AM (This time is 2 hour(s) before your procedure to ensure your preparation).   Free valet parking service is available. You will check in at ADMITTING. The support person will be asked to wait in the waiting room.  It is OK to have someone drop you off and come back when you are ready to be discharged.        Special note: Every effort is made to have your procedure done on time. Please understand that emergencies sometimes delay scheduled procedures.  2. Diet: Do not eat solid foods after midnight.  You may have clear liquids until 5 AM the day of the procedure.  3. Labs: You will need to have blood drawn upon arrival to the hospital.     4. Medication instructions in preparation for your procedure:    Stop taking Eliquis (Apixiban) on Friday, February 28.  Stop taking, Torsemide (Demadex) Monday, March 3,  On the morning of your procedure, take Aspirin 81 mg and any morning medicines NOT listed above.  You may use sips of water.  5. Plan to go home the same day, you will only stay overnight if medically necessary. 6. You MUST have a responsible adult to drive you home. 7. An adult MUST be with you the first 24 hours after you arrive home. 8. Bring a current list of your medications, and the last time and date medication taken. 9. Bring ID and current insurance cards. 10.Please wear clothes that are easy to get on and off and wear slip-on shoes.  Thank you for allowing [place] to care for you!   -- [facility]

## 2024-02-01 NOTE — Telephone Encounter (Signed)
 Left message for pt to call back to discuss Cardiac PET results.

## 2024-02-01 NOTE — Telephone Encounter (Signed)
 Patient identification verified by 2 forms. Spoke with pt's wife, Alroy Dust (ok per [facility]) earlier to discuss Cardiac PET results and need for urgent cath per Dr. Allyson Sabal. Pt's wife did bring up that pt is on Eliquis. No availability at [facility] or [facility] for heart cath on Friday. Pt will also need appointment with Dr. Lalla Brothers re-scheduled from Monday 3/3.   Called pt's wife back. Pt scheduled to have heart cath on Monday 3/3 at 9am. Instructions given and placed on Mychart. Appointment with Dr. Lalla Brothers re-scheduled for 3/12 at 3:30pm. Pt's wife verbalizes understanding and has no further questions at this time.   Pt will have labs drawn upon arrival to hospital on Monday morning.

## 2024-02-02 ENCOUNTER — Telehealth: Payer: Self-pay | Admitting: *Deleted

## 2024-02-02 DIAGNOSIS — I25810 Atherosclerosis of coronary artery bypass graft(s) without angina pectoris: Secondary | ICD-10-CM

## 2024-02-02 DIAGNOSIS — Z01812 Encounter for preprocedural laboratory examination: Secondary | ICD-10-CM

## 2024-02-02 NOTE — Telephone Encounter (Addendum)
 Cardiac Catheterization scheduled at [facility] for: Monday February 06, 2024 9 AM Arrival time [facility] Main Entrance A at: 7 AM  Nothing to eat after midnight prior to procedure, clear liquids until 5 AM day of procedure.  Medication instructions: -Hold:  Eliquis-none 02/04/24 until post procedure  Torsemide/KCl-AM of procedure -Other usual morning medications can be taken with sips of water including aspirin 81 mg.  Plan to go home the same day, you will only stay overnight if medically necessary.  You must have responsible adult to drive you home.  Someone must be with you the first 24 hours after you arrive home.  Reviewed procedure instructions with patient and patient's wife.

## 2024-02-03 ENCOUNTER — Other Ambulatory Visit: Payer: Self-pay | Admitting: Cardiovascular Disease

## 2024-02-03 ENCOUNTER — Other Ambulatory Visit
Admission: RE | Admit: 2024-02-03 | Discharge: 2024-02-03 | Disposition: A | Discharge status: Home / Self Care | Payer: Medicare Other | Source: Ambulatory Visit | Attending: Cardiovascular Disease | Admitting: Cardiovascular Disease

## 2024-02-03 DIAGNOSIS — I25810 Atherosclerosis of coronary artery bypass graft(s) without angina pectoris: Secondary | ICD-10-CM | POA: Diagnosis present

## 2024-02-03 DIAGNOSIS — Z01812 Encounter for preprocedural laboratory examination: Secondary | ICD-10-CM | POA: Diagnosis present

## 2024-02-03 DIAGNOSIS — R931 Abnormal findings on diagnostic imaging of heart and coronary circulation: Secondary | ICD-10-CM

## 2024-02-03 LAB — BASIC METABOLIC PANEL
Anion gap: 9 (ref 5–15)
BUN: 13 mg/dL (ref 8–23)
CO2: 26 mmol/L (ref 22–32)
Calcium: 8.8 mg/dL — ABNORMAL LOW (ref 8.9–10.3)
Chloride: 103 mmol/L (ref 98–111)
Creatinine, Ser: 0.98 mg/dL (ref 0.61–1.24)
GFR, Estimated: >60 mL/min (ref >60)
Glucose, Bld: 93 mg/dL (ref 70–99)
Potassium: 4.1 mmol/L (ref 3.5–5.1)
Sodium: 138 mmol/L (ref 135–145)

## 2024-02-06 ENCOUNTER — Ambulatory Visit (HOSPITAL_COMMUNITY)
Admission: RE | Admit: 2024-02-06 | Discharge: 2024-02-06 | Disposition: A | Discharge status: Home / Self Care | Payer: Medicare Other | Attending: Internal Medicine | Admitting: Internal Medicine

## 2024-02-06 ENCOUNTER — Ambulatory Visit: Payer: Medicare Other | Admitting: Cardiology

## 2024-02-06 ENCOUNTER — Other Ambulatory Visit: Payer: Self-pay

## 2024-02-06 ENCOUNTER — Encounter (HOSPITAL_COMMUNITY)
Admission: RE | Disposition: A | Discharge status: Home / Self Care | Payer: Self-pay | Source: Home / Self Care | Attending: Internal Medicine

## 2024-02-06 DIAGNOSIS — I509 Heart failure, unspecified: Secondary | ICD-10-CM | POA: Diagnosis not present

## 2024-02-06 DIAGNOSIS — F17210 Nicotine dependence, cigarettes, uncomplicated: Secondary | ICD-10-CM | POA: Diagnosis not present

## 2024-02-06 DIAGNOSIS — Z8249 Family history of ischemic heart disease and other diseases of the circulatory system: Secondary | ICD-10-CM | POA: Insufficient documentation

## 2024-02-06 DIAGNOSIS — I110 Hypertensive heart disease with heart failure: Secondary | ICD-10-CM | POA: Diagnosis not present

## 2024-02-06 DIAGNOSIS — Z7901 Long term (current) use of anticoagulants: Secondary | ICD-10-CM | POA: Insufficient documentation

## 2024-02-06 DIAGNOSIS — I25110 Atherosclerotic heart disease of native coronary artery with unstable angina pectoris: Secondary | ICD-10-CM | POA: Diagnosis present

## 2024-02-06 DIAGNOSIS — Z79899 Other long term (current) drug therapy: Secondary | ICD-10-CM | POA: Diagnosis not present

## 2024-02-06 DIAGNOSIS — Z951 Presence of aortocoronary bypass graft: Secondary | ICD-10-CM | POA: Insufficient documentation

## 2024-02-06 DIAGNOSIS — E785 Hyperlipidemia, unspecified: Secondary | ICD-10-CM | POA: Diagnosis not present

## 2024-02-06 DIAGNOSIS — I4819 Other persistent atrial fibrillation: Secondary | ICD-10-CM | POA: Diagnosis not present

## 2024-02-06 DIAGNOSIS — I2582 Chronic total occlusion of coronary artery: Secondary | ICD-10-CM | POA: Insufficient documentation

## 2024-02-06 DIAGNOSIS — I739 Peripheral vascular disease, unspecified: Secondary | ICD-10-CM | POA: Insufficient documentation

## 2024-02-06 DIAGNOSIS — I2584 Coronary atherosclerosis due to calcified coronary lesion: Secondary | ICD-10-CM | POA: Insufficient documentation

## 2024-02-06 DIAGNOSIS — R931 Abnormal findings on diagnostic imaging of heart and coronary circulation: Secondary | ICD-10-CM

## 2024-02-06 HISTORY — PX: LEFT HEART CATH AND CORS/GRAFTS ANGIOGRAPHY: CATH118250

## 2024-02-06 SURGERY — LEFT HEART CATH AND CORS/GRAFTS ANGIOGRAPHY
Anesthesia: LOCAL

## 2024-02-06 MED ORDER — RANOLAZINE ER 500 MG PO TB12: 500.0000 mg | ORAL_TABLET | Freq: Two times a day (BID) | ORAL | 5 refills | Status: DC

## 2024-02-06 MED ORDER — MIDAZOLAM HCL 2 MG/2ML IJ SOLN
INTRAMUSCULAR | Status: AC
  Filled 2024-02-06: qty 2

## 2024-02-06 MED ORDER — VERAPAMIL HCL 2.5 MG/ML IV SOLN
INTRAVENOUS | Status: DC | PRN
  Administered 2024-02-06: 10 mL via INTRA_ARTERIAL

## 2024-02-06 MED ORDER — HEPARIN (PORCINE) IN NACL 1000-0.9 UT/500ML-% IV SOLN
INTRAVENOUS | Status: DC | PRN
  Administered 2024-02-06 (×2): 500 mL via INTRA_ARTERIAL

## 2024-02-06 MED ORDER — LIDOCAINE HCL (PF) 1 % IJ SOLN
INTRAMUSCULAR | Status: DC | PRN
  Administered 2024-02-06: 2 mL

## 2024-02-06 MED ORDER — SODIUM CHLORIDE 0.9 % IV SOLN
INTRAVENOUS | Status: AC | PRN
  Administered 2024-02-06: 10 mL/h via INTRAVENOUS

## 2024-02-06 MED ORDER — MIDAZOLAM HCL 2 MG/2ML IJ SOLN
INTRAMUSCULAR | Status: DC | PRN
  Administered 2024-02-06: 1 mg via INTRAVENOUS

## 2024-02-06 MED ORDER — IOHEXOL 350 MG/ML SOLN
INTRAVENOUS | Status: DC | PRN
  Administered 2024-02-06: 70 mL via INTRA_ARTERIAL

## 2024-02-06 MED ORDER — HEPARIN SODIUM (PORCINE) 1000 UNIT/ML IJ SOLN
INTRAMUSCULAR | Status: DC | PRN
  Administered 2024-02-06: 4000 [IU] via INTRAVENOUS

## 2024-02-06 MED ORDER — FENTANYL CITRATE (PF) 100 MCG/2ML IJ SOLN
INTRAMUSCULAR | Status: DC | PRN
  Administered 2024-02-06: 25 ug via INTRAVENOUS

## 2024-02-06 MED ORDER — HEPARIN SODIUM (PORCINE) 1000 UNIT/ML IJ SOLN
INTRAMUSCULAR | Status: AC
  Filled 2024-02-06: qty 10

## 2024-02-06 MED ORDER — VERAPAMIL HCL 2.5 MG/ML IV SOLN
INTRAVENOUS | Status: AC
  Filled 2024-02-06: qty 2

## 2024-02-06 MED ORDER — ASPIRIN 81 MG PO CHEW
81.0000 mg | CHEWABLE_TABLET | Freq: Once | ORAL | Status: DC
  Filled 2024-02-06: qty 1

## 2024-02-06 MED ORDER — SODIUM CHLORIDE 0.9 % WEIGHT BASED INFUSION: 3.0000 mL/kg/h | INTRAVENOUS | Status: AC

## 2024-02-06 MED ORDER — LIDOCAINE HCL (PF) 1 % IJ SOLN
INTRAMUSCULAR | Status: AC
  Filled 2024-02-06: qty 30

## 2024-02-06 MED ORDER — SODIUM CHLORIDE 0.9 % WEIGHT BASED INFUSION: 1.0000 mL/kg/h | INTRAVENOUS | Status: DC

## 2024-02-06 MED ORDER — FENTANYL CITRATE (PF) 100 MCG/2ML IJ SOLN
INTRAMUSCULAR | Status: AC
  Filled 2024-02-06: qty 2

## 2024-02-06 SURGICAL SUPPLY — 10 items
CATH INFINITI 5 FR IM (CATHETERS) IMPLANT
CATH INFINITI 5FR AL1 (CATHETERS) IMPLANT
CATH INFINITI 5FR MULTPACK ANG (CATHETERS) IMPLANT
DEVICE RAD COMP TR BAND LRG (VASCULAR PRODUCTS) IMPLANT
ELECT DEFIB PAD ADLT CADENCE (PAD) IMPLANT
GLIDESHEATH SLEND SS 6F .021 (SHEATH) IMPLANT
GUIDEWIRE INQWIRE 1.5J.035X260 (WIRE) IMPLANT
INQWIRE 1.5J .035X260CM (WIRE) ×1 IMPLANT
PACK CARDIAC CATHETERIZATION (CUSTOM PROCEDURE TRAY) ×1 IMPLANT
SET ATX-X65L (MISCELLANEOUS) IMPLANT

## 2024-02-06 NOTE — Interval H&P Note (Signed)
 History and Physical Interval Note:  02/06/2024 9:00 AM  Gregory Shepherd  has presented today for surgery, with the diagnosis of coronary artery disease with unstable angina and abnormal stress test.  The various methods of treatment have been discussed with the patient and family. After consideration of risks, benefits and other options for treatment, the patient has consented to  Procedure(s): LEFT HEART CATH AND CORS/GRAFTS ANGIOGRAPHY (N/A) as a surgical intervention.  The patient's history has been reviewed, patient examined, no change in status, stable for surgery.  I have reviewed the patient's chart and labs.  Questions were answered to the patient's satisfaction.    Cath Lab Visit (complete for each Cath Lab visit)  Clinical Evaluation Leading to the Procedure:   ACS: No.  Non-ACS:    Anginal Classification: CCS IV  Anti-ischemic medical therapy: Maximal Therapy (2 or more classes of medications)  Non-Invasive Test Results: High-risk stress test findings: cardiac mortality >3%/year  Prior CABG: Previous CABG  Gregory Shepherd

## 2024-02-07 ENCOUNTER — Encounter (HOSPITAL_COMMUNITY): Payer: Self-pay | Admitting: Internal Medicine

## 2024-02-09 ENCOUNTER — Other Ambulatory Visit: Payer: Self-pay | Admitting: Cardiovascular Disease

## 2024-02-10 MED ORDER — ISOSORBIDE MONONITRATE ER 60 MG PO TB24: 90.0000 mg | ORAL_TABLET | Freq: Every day | ORAL | 3 refills | Status: DC

## 2024-02-15 ENCOUNTER — Other Ambulatory Visit: Payer: Self-pay

## 2024-02-15 ENCOUNTER — Encounter: Payer: Self-pay | Admitting: Cardiology

## 2024-02-15 ENCOUNTER — Ambulatory Visit: Payer: Medicare Other | Attending: Cardiology | Admitting: Cardiology

## 2024-02-15 ENCOUNTER — Other Ambulatory Visit: Payer: Self-pay | Admitting: Cardiology

## 2024-02-15 VITALS — BP 110/60 | HR 75 | Ht 72.0 in | Wt 165.0 lb

## 2024-02-15 DIAGNOSIS — I4819 Other persistent atrial fibrillation: Secondary | ICD-10-CM | POA: Diagnosis not present

## 2024-02-15 NOTE — H&P (View-Only) (Signed)
  Electrophysiology Office Follow up Visit Note:    Date:  02/15/2024   ID:  Mizael, Sagar 09-20-1946, MRN [id number]  PCP:  Lauro Regulus, MD  [facility] Cardiologist:  Nanetta Batty, MD  [facility] Electrophysiologist:  Lanier Prude, MD    Interval History:     Gregory Shepherd is a 78 y.o. male who presents for a follow up visit.   I last saw the patient in December 15, 2021 for his atrial fibrillation.  He was previously scheduled for ablation but this had to be canceled because of acutely decompensated heart failure.  He was on amiodarone for rhythm control.  He has a history of coronary artery disease with prior CABG.  He saw York Ram in clinic January 16, 2024.  He is with his wife today in clinic.  They report several weeks of functional decline.  He has had cataract surgery in both eyes.  He also had some spinal injections.  He has been slow to recover from these.  He is hopeful that returning to normal rhythm will help with symptoms.      Past medical, surgical, social and family history were reviewed.  ROS:   Please see the history of present illness.    All other systems reviewed and are negative.  EKGs/Labs/Other Studies Reviewed:    The following studies were reviewed today:  January 16, 2024 EKG shows atrial fibrillation, right bundle branch block, inferior infarct        Physical Exam:    VS:  BP 110/60   Pulse 75   Ht 6' (1.829 m)   Wt 165 lb (74.8 kg)   SpO2 95%   BMI 22.38 kg/m     Wt Readings from Last 3 Encounters:  02/15/24 165 lb (74.8 kg)  02/06/24 175 lb (79.4 kg)  01/16/24 173 lb 6.4 oz (78.7 kg)     GEN: no distress.  Elderly CARD: Irregularly irregular, No MRG RESP: No IWOB. CTAB.      ASSESSMENT:    No diagnosis found. PLAN:    In order of problems listed above:  #Persistent atrial fibrillation #High risk med use-amiodarone Continue Eliquis for stroke prophylaxis.  I like to try to  get him back in normal rhythm.  I discussed cardioversion in detail including risks and he wishes to proceed.  He has not missed any anticoagulation doses.  He is not a candidate for catheter ablation given severe peripheral vascular disease and coronary artery disease.  Continue amiodarone 200 mg by mouth once daily.  Update CMP, TSH and free T4 today.  #Coronary artery disease Recent cath with no revascularization options.  Continue ranolazine, Coreg.  Follow-up 6 months with A-fib clinic.  Signed, Steffanie Dunn, MD, [facility], [facility] 02/15/2024 3:26 PM    Electrophysiology [facility]

## 2024-02-15 NOTE — Progress Notes (Signed)
  Electrophysiology Office Follow up Visit Note:    Date:  02/15/2024   ID:  Mizael, Sagar 09-20-1946, MRN [id number]  PCP:  Lauro Regulus, MD  [facility] Cardiologist:  Nanetta Batty, MD  [facility] Electrophysiologist:  Lanier Prude, MD    Interval History:     Gregory Shepherd is a 78 y.o. male who presents for a follow up visit.   I last saw the patient in December 15, 2021 for his atrial fibrillation.  He was previously scheduled for ablation but this had to be canceled because of acutely decompensated heart failure.  He was on amiodarone for rhythm control.  He has a history of coronary artery disease with prior CABG.  He saw York Ram in clinic January 16, 2024.  He is with his wife today in clinic.  They report several weeks of functional decline.  He has had cataract surgery in both eyes.  He also had some spinal injections.  He has been slow to recover from these.  He is hopeful that returning to normal rhythm will help with symptoms.      Past medical, surgical, social and family history were reviewed.  ROS:   Please see the history of present illness.    All other systems reviewed and are negative.  EKGs/Labs/Other Studies Reviewed:    The following studies were reviewed today:  January 16, 2024 EKG shows atrial fibrillation, right bundle branch block, inferior infarct        Physical Exam:    VS:  BP 110/60   Pulse 75   Ht 6' (1.829 m)   Wt 165 lb (74.8 kg)   SpO2 95%   BMI 22.38 kg/m     Wt Readings from Last 3 Encounters:  02/15/24 165 lb (74.8 kg)  02/06/24 175 lb (79.4 kg)  01/16/24 173 lb 6.4 oz (78.7 kg)     GEN: no distress.  Elderly CARD: Irregularly irregular, No MRG RESP: No IWOB. CTAB.      ASSESSMENT:    No diagnosis found. PLAN:    In order of problems listed above:  #Persistent atrial fibrillation #High risk med use-amiodarone Continue Eliquis for stroke prophylaxis.  I like to try to  get him back in normal rhythm.  I discussed cardioversion in detail including risks and he wishes to proceed.  He has not missed any anticoagulation doses.  He is not a candidate for catheter ablation given severe peripheral vascular disease and coronary artery disease.  Continue amiodarone 200 mg by mouth once daily.  Update CMP, TSH and free T4 today.  #Coronary artery disease Recent cath with no revascularization options.  Continue ranolazine, Coreg.  Follow-up 6 months with A-fib clinic.  Signed, Steffanie Dunn, MD, [facility], [facility] 02/15/2024 3:26 PM    Electrophysiology [facility]

## 2024-02-15 NOTE — Patient Instructions (Signed)
 Medication Instructions:  Your physician recommends that you continue on your current medications as directed. Please refer to the Current Medication list given to you today.  *If you need a refill on your cardiac medications before your next appointment, please call your pharmacy*   Lab Work: TODAY: CMET, TSH, T4 CBC   If you have labs (blood work) drawn today and your tests are completely normal, you will receive your results only by: MyChart Message (if you have MyChart) OR A paper copy in the mail If you have any lab test that is abnormal or we need to change your treatment, we will call you to review the results.   Testing/Procedures: Cardioversion Your physician has recommended that you have a Cardioversion (DCCV). Electrical Cardioversion uses a jolt of electricity to your heart either through paddles or wired patches attached to your chest. This is a controlled, usually prescheduled, procedure. Defibrillation is done under light anesthesia in the hospital, and you usually go home the day of the procedure. This is done to get your heart back into a normal rhythm. You are not awake for the procedure. Please see the instruction sheet given to you today.  Follow-Up: At [facility], you and your health needs are our priority.  As part of our continuing mission to provide you with exceptional heart care, we have created designated Provider Care Teams.  These Care Teams include your primary Cardiologist (physician) and Advanced Practice Providers (APPs -  Physician Assistants and Nurse Practitioners) who all work together to provide you with the care you need, when you need it.  Your next appointment:   2 months  Provider:   You will see one of the following Advanced Practice Providers on your designated Care Team:   Francis Dowse, Charlott Holler [address] [place], [place] Sherie Don, NP Canary Brim, NP

## 2024-02-16 ENCOUNTER — Other Ambulatory Visit: Payer: Medicare Other

## 2024-02-16 LAB — COMPREHENSIVE METABOLIC PANEL
ALT: 26 IU/L (ref 0–44)
AST: 17 IU/L (ref 0–40)
Albumin: 3.2 g/dL — ABNORMAL LOW (ref 3.8–4.8)
Alkaline Phosphatase: 108 IU/L (ref 44–121)
BUN/Creatinine Ratio: 13 (ref 10–24)
BUN: 15 mg/dL (ref 8–27)
Bilirubin Total: 0.6 mg/dL (ref 0.0–1.2)
CO2: 26 mmol/L (ref 20–29)
Calcium: 8.6 mg/dL (ref 8.6–10.2)
Chloride: 103 mmol/L (ref 96–106)
Creatinine, Ser: 1.16 mg/dL (ref 0.76–1.27)
Globulin, Total: 2.2 g/dL (ref 1.5–4.5)
Glucose: 98 mg/dL (ref 70–99)
Potassium: 3.7 mmol/L (ref 3.5–5.2)
Sodium: 142 mmol/L (ref 134–144)
Total Protein: 5.4 g/dL — ABNORMAL LOW (ref 6.0–8.5)
eGFR: 64 mL/min/{1.73_m2} (ref >59)

## 2024-02-16 LAB — CBC
Hematocrit: 42.3 % (ref 37.5–51.0)
Hemoglobin: 14.1 g/dL (ref 13.0–17.7)
MCH: 31.9 pg (ref 26.6–33.0)
MCHC: 33.3 g/dL (ref 31.5–35.7)
MCV: 96 fL (ref 79–97)
Platelets: 202 10*3/uL (ref 150–450)
RBC: 4.42 x10E6/uL (ref 4.14–5.80)
RDW: 13.1 % (ref 11.6–15.4)
WBC: 10.7 10*3/uL (ref 3.4–10.8)

## 2024-02-16 LAB — T4, FREE: Free T4: 2.49 ng/dL — ABNORMAL HIGH (ref 0.82–1.77)

## 2024-02-16 LAB — TSH: TSH: 0.079 u[IU]/mL — ABNORMAL LOW (ref 0.450–4.500)

## 2024-02-16 NOTE — Progress Notes (Signed)
 Attempted to reach patient and call went to generic voicemail. Left message for patient to return call to [phone].

## 2024-02-16 NOTE — Progress Notes (Signed)
 Spoke to patient's wife and instructed them to come at 1030  and to be NPO after 0000.  Medications reviewed.    Confirmed that patient will have a ride home and someone to stay with them for 24 hours after the procedure.

## 2024-02-17 ENCOUNTER — Ambulatory Visit (HOSPITAL_COMMUNITY)
Admission: RE | Admit: 2024-02-17 | Discharge: 2024-02-17 | Disposition: A | Discharge status: Home / Self Care | Attending: Cardiology | Admitting: Cardiology

## 2024-02-17 ENCOUNTER — Ambulatory Visit (HOSPITAL_COMMUNITY): Admitting: Anesthesiology

## 2024-02-17 ENCOUNTER — Other Ambulatory Visit: Payer: Self-pay

## 2024-02-17 ENCOUNTER — Encounter (HOSPITAL_COMMUNITY)
Admission: RE | Disposition: A | Discharge status: Home / Self Care | Payer: Self-pay | Source: Home / Self Care | Attending: Cardiology

## 2024-02-17 DIAGNOSIS — Z951 Presence of aortocoronary bypass graft: Secondary | ICD-10-CM | POA: Insufficient documentation

## 2024-02-17 DIAGNOSIS — I4891 Unspecified atrial fibrillation: Secondary | ICD-10-CM

## 2024-02-17 DIAGNOSIS — I2510 Atherosclerotic heart disease of native coronary artery without angina pectoris: Secondary | ICD-10-CM | POA: Insufficient documentation

## 2024-02-17 DIAGNOSIS — Z7901 Long term (current) use of anticoagulants: Secondary | ICD-10-CM | POA: Diagnosis not present

## 2024-02-17 DIAGNOSIS — Z01818 Encounter for other preprocedural examination: Secondary | ICD-10-CM

## 2024-02-17 DIAGNOSIS — I4819 Other persistent atrial fibrillation: Secondary | ICD-10-CM | POA: Diagnosis present

## 2024-02-17 DIAGNOSIS — Z79899 Other long term (current) drug therapy: Secondary | ICD-10-CM | POA: Insufficient documentation

## 2024-02-17 HISTORY — PX: CARDIOVERSION: EP1203

## 2024-02-17 SURGERY — CARDIOVERSION (CATH LAB)
Anesthesia: Monitor Anesthesia Care

## 2024-02-17 MED ORDER — SODIUM CHLORIDE 0.9 % IV SOLN: INTRAVENOUS | Status: DC

## 2024-02-17 MED ORDER — PROPOFOL 10 MG/ML IV BOLUS
INTRAVENOUS | Status: DC | PRN
  Administered 2024-02-17: 50 mg via INTRAVENOUS

## 2024-02-17 SURGICAL SUPPLY — 1 items: PAD DEFIB RADIO PHYSIO CONN (PAD) ×1 IMPLANT

## 2024-02-17 NOTE — CV Procedure (Signed)
 Procedure:   DCCV  Indication:  Symptomatic atrial fibrillation  Procedure Note:  The patient signed informed consent.  They have had had therapeutic anticoagulation with Eliquis greater than 3 weeks.  Anesthesia was administered by Dr. Maple Hudson and Beola Cord, CRNA.  Adequate airway was maintained throughout and vital followed per protocol.  They were cardioverted x 1 with 200J of biphasic synchronized energy.  They converted to sinus rhythm with PACs, rate 50s.  There were no apparent complications.  The patient had normal neuro status and respiratory status post procedure with vitals stable as recorded elsewhere.    Follow up:  They will continue on current medical therapy and follow up with cardiology as scheduled.  Epifanio Lesches, MD 02/17/2024 11:30 AM

## 2024-02-17 NOTE — Transfer of Care (Signed)
 Immediate Anesthesia Transfer of Care Note  Patient: Gregory Shepherd  Procedure(s) Performed: CARDIOVERSION  Patient Location: PACU  Anesthesia Type:MAC  Level of Consciousness: awake, drowsy, patient cooperative, and responds to stimulation  Airway & Oxygen Therapy: Patient Spontanous Breathing and Patient connected to nasal cannula oxygen  Post-op Assessment: Report given to RN and Post -op Vital signs reviewed and stable  Post vital signs: Reviewed and stable  Last Vitals:  Vitals Value Taken Time  BP 154/63 02/17/24 1132  Temp 96.3 02/17/24 1132  Pulse 55 02/17/24 1132  Resp 18 02/17/24 1132  SpO2 97 02/17/24 1132    Last Pain:  Vitals:   02/17/24 1045  TempSrc:   PainSc: 0-No pain         Complications: No notable events documented.

## 2024-02-17 NOTE — Interval H&P Note (Signed)
 History and Physical Interval Note:  02/17/2024 11:00 AM  Gregory Shepherd  has presented today for surgery, with the diagnosis of AFIB.  The various methods of treatment have been discussed with the patient and family. After consideration of risks, benefits and other options for treatment, the patient has consented to  Procedure(s): CARDIOVERSION (N/A) as a surgical intervention.  The patient's history has been reviewed, patient examined, no change in status, stable for surgery.  I have reviewed the patient's chart and labs.  Questions were answered to the patient's satisfaction.     [name]

## 2024-02-19 ENCOUNTER — Encounter: Payer: Self-pay | Admitting: Cardiology

## 2024-02-20 ENCOUNTER — Emergency Department (HOSPITAL_COMMUNITY)
Admission: EM | Admit: 2024-02-20 | Discharge: 2024-02-21 | Discharge status: Against Medical Advice | Attending: Emergency Medicine | Admitting: Emergency Medicine

## 2024-02-20 ENCOUNTER — Other Ambulatory Visit: Payer: Self-pay

## 2024-02-20 ENCOUNTER — Emergency Department (HOSPITAL_COMMUNITY)

## 2024-02-20 ENCOUNTER — Encounter (HOSPITAL_COMMUNITY): Payer: Self-pay | Admitting: Cardiology

## 2024-02-20 ENCOUNTER — Telehealth: Payer: Self-pay

## 2024-02-20 DIAGNOSIS — Z79899 Other long term (current) drug therapy: Secondary | ICD-10-CM

## 2024-02-20 DIAGNOSIS — Z5321 Procedure and treatment not carried out due to patient leaving prior to being seen by health care provider: Secondary | ICD-10-CM | POA: Insufficient documentation

## 2024-02-20 DIAGNOSIS — I4819 Other persistent atrial fibrillation: Secondary | ICD-10-CM

## 2024-02-20 DIAGNOSIS — R531 Weakness: Secondary | ICD-10-CM | POA: Insufficient documentation

## 2024-02-20 LAB — CBC WITH DIFFERENTIAL/PLATELET
Abs Immature Granulocytes: 0.02 10*3/uL (ref 0.00–0.07)
Basophils Absolute: 0.1 10*3/uL (ref 0.0–0.1)
Basophils Relative: 1 %
Eosinophils Absolute: 0.1 10*3/uL (ref 0.0–0.5)
Eosinophils Relative: 1 %
HCT: 42 % (ref 39.0–52.0)
Hemoglobin: 14.4 g/dL (ref 13.0–17.0)
Immature Granulocytes: 0 %
Lymphocytes Relative: 10 %
Lymphs Abs: 0.7 10*3/uL (ref 0.7–4.0)
MCH: 32.4 pg (ref 26.0–34.0)
MCHC: 34.3 g/dL (ref 30.0–36.0)
MCV: 94.4 fL (ref 80.0–100.0)
Monocytes Absolute: 0.6 10*3/uL (ref 0.1–1.0)
Monocytes Relative: 8 %
Neutro Abs: 5.5 10*3/uL (ref 1.7–7.7)
Neutrophils Relative %: 80 %
Platelets: 219 10*3/uL (ref 150–400)
RBC: 4.45 MIL/uL (ref 4.22–5.81)
RDW: 14.2 % (ref 11.5–15.5)
WBC: 7 10*3/uL (ref 4.0–10.5)
nRBC: 0 % (ref 0.0–0.2)

## 2024-02-20 LAB — BASIC METABOLIC PANEL
Anion gap: 8 (ref 5–15)
BUN: 9 mg/dL (ref 8–23)
CO2: 25 mmol/L (ref 22–32)
Calcium: 8.7 mg/dL — ABNORMAL LOW (ref 8.9–10.3)
Chloride: 102 mmol/L (ref 98–111)
Creatinine, Ser: 0.93 mg/dL (ref 0.61–1.24)
GFR, Estimated: >60 mL/min (ref >60)
Glucose, Bld: 94 mg/dL (ref 70–99)
Potassium: 4.3 mmol/L (ref 3.5–5.1)
Sodium: 135 mmol/L (ref 135–145)

## 2024-02-20 NOTE — Telephone Encounter (Signed)
 The patient has been notified of the result and verbalized understanding.  All questions (if any) were answered. Frutoso Schatz, RN 02/20/2024 10:22 AM  Labs have been ordered and message has been sent to PCP.

## 2024-02-20 NOTE — Telephone Encounter (Signed)
-----   Message from Lanier Prude sent at 02/19/2024  8:37 PM EDT ----- Labs reviewed. Thyroid is overactive.   Carly, please have Mr Schwager stop his Levothyroxine (Synthroid). He needs repeat lab work in 6 weeks. He needs to follow up with his primary care physician in the next 2-4 weeks.  Thanks,  Sheria Lang T. Lalla Brothers, MD, [facility], [facility] Cardiac Electrophysiology

## 2024-02-20 NOTE — ED Provider Notes (Signed)
 EMS triage Medical screening  Patient with a history of 4 falls yesterday says is due to increased weakness in his lower extremities which has been a chronic problem but getting worse.  With the fall he got skin tears to his left and right forearm and has an abrasion to the top of his head.  Patient is on Eliquis.  No loss of consciousness.  Patient also with complaint of bilateral rib pain.  No abdominal pain.  But has had vomiting in the morning only since Saturday no diarrhea.  Temp 98.2 pulse 58 respirations 18 blood pressure 132/62 oxygen sats are 99% on room air. Patient is alert and oriented.  Nontoxic no acute distress. Head with a abrasion to the top with a scab measuring about 1 x 3 cm on the left top of the head. Thoracic lumbar part of spine nontender. Lungs clear bilaterally some tenderness anteriorly to palpation lower part of the ribs. Abdomen soft nontender. Extremities no edema or swelling to lower extremity.  Skin tears to left and right forearm.  That are already dressed.  Will get CT head and neck will get chest x-ray with bilateral rib series.  Will get basic labs and urinalysis.     Vanetta Mulders, MD 02/20/24 [phone]

## 2024-02-20 NOTE — ED Triage Notes (Signed)
 Pt from home and has felt weak/ vomiting since Saturday. Pt states he fell 4 times. C/O bilateral rib pain. Pt states he hit head and denies LOC. Pt is on elliquis.

## 2024-02-21 ENCOUNTER — Ambulatory Visit: Payer: Medicare Other | Admitting: Cardiovascular Disease

## 2024-02-23 ENCOUNTER — Other Ambulatory Visit: Payer: Self-pay | Admitting: Nurse Practitioner

## 2024-02-24 NOTE — Anesthesia Postprocedure Evaluation (Signed)
 Anesthesia Post Note  Patient: Gregory Shepherd  Procedure(s) Performed: CARDIOVERSION     Patient location during evaluation: Cath Lab Anesthesia Type: General Level of consciousness: awake and alert Pain management: pain level controlled Vital Signs Assessment: post-procedure vital signs reviewed and stable Respiratory status: spontaneous breathing, nonlabored ventilation and respiratory function stable Cardiovascular status: blood pressure returned to baseline and stable Postop Assessment: no apparent nausea or vomiting Anesthetic complications: no   There were no known notable events for this encounter.  Last Vitals:  Vitals:   02/17/24 1205 02/17/24 1208  BP: (!) 146/55 (!) 149/57  Pulse: (!) 56 (!) 54  Resp: 20 18  Temp:  (!) 36.3 C  SpO2: 94% 95%    Last Pain:  Vitals:   02/17/24 1045  TempSrc:   PainSc: 0-No pain                 Cortlyn Cannell

## 2024-02-24 NOTE — Anesthesia Preprocedure Evaluation (Signed)
 Anesthesia Evaluation  Patient identified by MRN, date of birth, ID band Patient awake    Reviewed: Allergy & Precautions, NPO status , Patient's Chart, lab work & pertinent test results  History of Anesthesia Complications Negative for: history of anesthetic complications  Airway Mallampati: II  TM Distance: >3 FB Neck ROM: Full    Dental  (+) Dental Advisory Given   Pulmonary shortness of breath, Current Smoker and Patient abstained from smoking.   breath sounds clear to auscultation       Cardiovascular hypertension, Pt. on medications + CAD, + Past MI, + CABG (1990) and + Peripheral Vascular Disease  + dysrhythmias Atrial Fibrillation  Rhythm:Irregular  TTE 11/2021: EF 25-30%, global hypokinesis, mild LVE, mildly reduced RV function, moderate MR, mild to moderate AR    Neuro/Psych negative neurological ROS  negative psych ROS   GI/Hepatic Neg liver ROS,GERD  Controlled and Medicated,,  Endo/Other  negative endocrine ROS    Renal/GU negative Renal ROS     Musculoskeletal negative musculoskeletal ROS (+)    Abdominal   Peds  Hematology   Anesthesia Other Findings Day of surgery medications reviewed with patient.  Reproductive/Obstetrics negative OB ROS                              Anesthesia Physical Anesthesia Plan  ASA: 4  Anesthesia Plan: General   Post-op Pain Management: Minimal or no pain anticipated   Induction: Intravenous  PONV Risk Score and Plan: Treatment may vary due to age or medical condition  Airway Management Planned: Natural Airway, Nasal Cannula and Simple Face Mask  Additional Equipment: None  Intra-op Plan:   Post-operative Plan:   Informed Consent: I have reviewed the patients History and Physical, chart, labs and discussed the procedure including the risks, benefits and alternatives for the proposed anesthesia with the patient or authorized  representative who has indicated his/her understanding and acceptance.       Plan Discussed with: CRNA  Anesthesia Plan Comments:          Anesthesia Quick Evaluation

## 2024-02-27 ENCOUNTER — Other Ambulatory Visit: Payer: Self-pay | Admitting: Physician Assistant

## 2024-02-27 ENCOUNTER — Ambulatory Visit
Admission: RE | Admit: 2024-02-27 | Discharge: 2024-02-27 | Disposition: A | Discharge status: Home / Self Care | Source: Ambulatory Visit | Attending: Physician Assistant

## 2024-02-27 DIAGNOSIS — M25552 Pain in left hip: Secondary | ICD-10-CM

## 2024-02-27 DIAGNOSIS — W19XXXA Unspecified fall, initial encounter: Secondary | ICD-10-CM

## 2024-02-28 ENCOUNTER — Inpatient Hospital Stay
Admission: RE | Admit: 2024-02-28 | Discharge: 2024-03-02 | DRG: 481 | Disposition: A | Discharge status: Skilled Nursing Facility | Attending: Orthopedic Surgery | Admitting: Orthopedic Surgery

## 2024-02-28 ENCOUNTER — Other Ambulatory Visit: Payer: Self-pay

## 2024-02-28 ENCOUNTER — Other Ambulatory Visit (HOSPITAL_COMMUNITY): Payer: Medicare Other

## 2024-02-28 ENCOUNTER — Inpatient Hospital Stay: Admitting: Certified Registered"

## 2024-02-28 ENCOUNTER — Inpatient Hospital Stay

## 2024-02-28 ENCOUNTER — Encounter: Payer: Self-pay | Admitting: Orthopedic Surgery

## 2024-02-28 ENCOUNTER — Encounter
Admission: RE | Disposition: A | Discharge status: Skilled Nursing Facility | Payer: Self-pay | Source: Home / Self Care | Attending: Orthopedic Surgery

## 2024-02-28 DIAGNOSIS — I4819 Other persistent atrial fibrillation: Secondary | ICD-10-CM | POA: Diagnosis present

## 2024-02-28 DIAGNOSIS — Z91018 Allergy to other foods: Secondary | ICD-10-CM

## 2024-02-28 DIAGNOSIS — E871 Hypo-osmolality and hyponatremia: Secondary | ICD-10-CM | POA: Diagnosis present

## 2024-02-28 DIAGNOSIS — I739 Peripheral vascular disease, unspecified: Secondary | ICD-10-CM

## 2024-02-28 DIAGNOSIS — I110 Hypertensive heart disease with heart failure: Secondary | ICD-10-CM | POA: Diagnosis present

## 2024-02-28 DIAGNOSIS — M79604 Pain in right leg: Principal | ICD-10-CM

## 2024-02-28 DIAGNOSIS — Z79899 Other long term (current) drug therapy: Secondary | ICD-10-CM

## 2024-02-28 DIAGNOSIS — I10 Essential (primary) hypertension: Secondary | ICD-10-CM | POA: Diagnosis not present

## 2024-02-28 DIAGNOSIS — I5042 Chronic combined systolic (congestive) and diastolic (congestive) heart failure: Secondary | ICD-10-CM | POA: Diagnosis present

## 2024-02-28 DIAGNOSIS — E785 Hyperlipidemia, unspecified: Secondary | ICD-10-CM | POA: Diagnosis present

## 2024-02-28 DIAGNOSIS — S50312A Abrasion of left elbow, initial encounter: Secondary | ICD-10-CM | POA: Diagnosis present

## 2024-02-28 DIAGNOSIS — S72002A Fracture of unspecified part of neck of left femur, initial encounter for closed fracture: Secondary | ICD-10-CM | POA: Diagnosis present

## 2024-02-28 DIAGNOSIS — K219 Gastro-esophageal reflux disease without esophagitis: Secondary | ICD-10-CM | POA: Diagnosis present

## 2024-02-28 DIAGNOSIS — F32A Depression, unspecified: Secondary | ICD-10-CM | POA: Diagnosis present

## 2024-02-28 DIAGNOSIS — N138 Other obstructive and reflux uropathy: Secondary | ICD-10-CM | POA: Diagnosis present

## 2024-02-28 DIAGNOSIS — S50311A Abrasion of right elbow, initial encounter: Secondary | ICD-10-CM | POA: Diagnosis present

## 2024-02-28 DIAGNOSIS — Z8249 Family history of ischemic heart disease and other diseases of the circulatory system: Secondary | ICD-10-CM

## 2024-02-28 DIAGNOSIS — E039 Hypothyroidism, unspecified: Secondary | ICD-10-CM | POA: Diagnosis present

## 2024-02-28 DIAGNOSIS — Z79890 Hormone replacement therapy (postmenopausal): Secondary | ICD-10-CM

## 2024-02-28 DIAGNOSIS — E440 Moderate protein-calorie malnutrition: Secondary | ICD-10-CM | POA: Insufficient documentation

## 2024-02-28 DIAGNOSIS — Z7901 Long term (current) use of anticoagulants: Secondary | ICD-10-CM | POA: Diagnosis not present

## 2024-02-28 DIAGNOSIS — E1151 Type 2 diabetes mellitus with diabetic peripheral angiopathy without gangrene: Secondary | ICD-10-CM | POA: Diagnosis present

## 2024-02-28 DIAGNOSIS — Z860100 Personal history of colon polyps, unspecified: Secondary | ICD-10-CM

## 2024-02-28 DIAGNOSIS — S72012A Unspecified intracapsular fracture of left femur, initial encounter for closed fracture: Secondary | ICD-10-CM | POA: Diagnosis present

## 2024-02-28 DIAGNOSIS — N401 Enlarged prostate with lower urinary tract symptoms: Secondary | ICD-10-CM | POA: Diagnosis present

## 2024-02-28 DIAGNOSIS — I252 Old myocardial infarction: Secondary | ICD-10-CM

## 2024-02-28 DIAGNOSIS — D72829 Elevated white blood cell count, unspecified: Secondary | ICD-10-CM | POA: Diagnosis not present

## 2024-02-28 DIAGNOSIS — I951 Orthostatic hypotension: Secondary | ICD-10-CM | POA: Diagnosis present

## 2024-02-28 DIAGNOSIS — F17210 Nicotine dependence, cigarettes, uncomplicated: Secondary | ICD-10-CM | POA: Diagnosis present

## 2024-02-28 DIAGNOSIS — Z86718 Personal history of other venous thrombosis and embolism: Secondary | ICD-10-CM

## 2024-02-28 DIAGNOSIS — W19XXXA Unspecified fall, initial encounter: Secondary | ICD-10-CM | POA: Diagnosis present

## 2024-02-28 DIAGNOSIS — M5030 Other cervical disc degeneration, unspecified cervical region: Secondary | ICD-10-CM | POA: Diagnosis present

## 2024-02-28 DIAGNOSIS — Z6823 Body mass index (BMI) 23.0-23.9, adult: Secondary | ICD-10-CM

## 2024-02-28 DIAGNOSIS — F17200 Nicotine dependence, unspecified, uncomplicated: Secondary | ICD-10-CM | POA: Diagnosis not present

## 2024-02-28 DIAGNOSIS — Z888 Allergy status to other drugs, medicaments and biological substances status: Secondary | ICD-10-CM

## 2024-02-28 DIAGNOSIS — Z833 Family history of diabetes mellitus: Secondary | ICD-10-CM

## 2024-02-28 DIAGNOSIS — I2510 Atherosclerotic heart disease of native coronary artery without angina pectoris: Secondary | ICD-10-CM | POA: Diagnosis present

## 2024-02-28 DIAGNOSIS — Z951 Presence of aortocoronary bypass graft: Secondary | ICD-10-CM

## 2024-02-28 HISTORY — PX: PERCUTANEOUS PINNING: SHX2209

## 2024-02-28 LAB — TYPE AND SCREEN
ABO/RH(D): A POS
Antibody Screen: NEGATIVE

## 2024-02-28 LAB — BASIC METABOLIC PANEL
Anion gap: 7 (ref 5–15)
BUN: 17 mg/dL (ref 8–23)
CO2: 23 mmol/L (ref 22–32)
Calcium: 8.3 mg/dL — ABNORMAL LOW (ref 8.9–10.3)
Chloride: 99 mmol/L (ref 98–111)
Creatinine, Ser: 0.93 mg/dL (ref 0.61–1.24)
GFR, Estimated: >60 mL/min (ref >60)
Glucose, Bld: 151 mg/dL — ABNORMAL HIGH (ref 70–99)
Potassium: 4.9 mmol/L (ref 3.5–5.1)
Sodium: 129 mmol/L — ABNORMAL LOW (ref 135–145)

## 2024-02-28 LAB — BRAIN NATRIURETIC PEPTIDE: B Natriuretic Peptide: 579.8 pg/mL — ABNORMAL HIGH (ref 0.0–100.0)

## 2024-02-28 LAB — CBC
HCT: 39.1 % (ref 39.0–52.0)
Hemoglobin: 13.7 g/dL (ref 13.0–17.0)
MCH: 31.9 pg (ref 26.0–34.0)
MCHC: 35 g/dL (ref 30.0–36.0)
MCV: 91.1 fL (ref 80.0–100.0)
Platelets: 215 10*3/uL (ref 150–400)
RBC: 4.29 MIL/uL (ref 4.22–5.81)
RDW: 13.7 % (ref 11.5–15.5)
WBC: 7.2 10*3/uL (ref 4.0–10.5)
nRBC: 0 % (ref 0.0–0.2)

## 2024-02-28 LAB — ABO/RH: ABO/RH(D): A POS

## 2024-02-28 SURGERY — PINNING, EXTREMITY, PERCUTANEOUS
Anesthesia: General | Site: Leg Upper | Laterality: Left

## 2024-02-28 MED ORDER — ISOSORBIDE MONONITRATE ER 30 MG PO TB24
90.0000 mg | ORAL_TABLET | Freq: Every day | ORAL | Status: DC
  Filled 2024-02-28: qty 3

## 2024-02-28 MED ORDER — FINASTERIDE 5 MG PO TABS
5.0000 mg | ORAL_TABLET | Freq: Every day | ORAL | Status: DC
  Administered 2024-02-28 – 2024-03-01 (×3): 5 mg via ORAL
  Filled 2024-02-28 (×3): qty 1

## 2024-02-28 MED ORDER — NITROGLYCERIN 0.4 MG SL SUBL
0.4000 mg | SUBLINGUAL_TABLET | SUBLINGUAL | Status: DC | PRN
Start: 2024-02-28 — End: 2024-03-02
  Administered 2024-02-28: 0.4 mg via SUBLINGUAL
  Filled 2024-02-28: qty 1

## 2024-02-28 MED ORDER — HYDROCODONE-ACETAMINOPHEN 5-325 MG PO TABS: 1.0000 | ORAL_TABLET | Freq: Four times a day (QID) | ORAL | Status: DC | PRN

## 2024-02-28 MED ORDER — AMIODARONE HCL 200 MG PO TABS
200.0000 mg | ORAL_TABLET | Freq: Every day | ORAL | Status: DC
  Administered 2024-02-29 – 2024-03-01 (×2): 200 mg via ORAL
  Filled 2024-02-28 (×2): qty 1

## 2024-02-28 MED ORDER — ACETAMINOPHEN 325 MG PO TABS: 650.0000 mg | ORAL_TABLET | Freq: Four times a day (QID) | ORAL | Status: DC | PRN

## 2024-02-28 MED ORDER — PROPOFOL 1000 MG/100ML IV EMUL
INTRAVENOUS | Status: AC
  Filled 2024-02-28: qty 100

## 2024-02-28 MED ORDER — MAGNESIUM HYDROXIDE 400 MG/5ML PO SUSP
30.0000 mL | Freq: Every day | ORAL | Status: DC | PRN
  Administered 2024-02-29: 30 mL via ORAL
  Filled 2024-02-28: qty 30

## 2024-02-28 MED ORDER — OXYCODONE HCL 5 MG PO TABS: 5.0000 mg | ORAL_TABLET | Freq: Once | ORAL | Status: DC | PRN

## 2024-02-28 MED ORDER — PROPOFOL 10 MG/ML IV BOLUS
INTRAVENOUS | Status: AC
  Filled 2024-02-28: qty 20

## 2024-02-28 MED ORDER — NICOTINE 21 MG/24HR TD PT24
21.0000 mg | MEDICATED_PATCH | Freq: Every day | TRANSDERMAL | Status: DC
  Administered 2024-02-28 – 2024-03-02 (×4): 21 mg via TRANSDERMAL
  Filled 2024-02-28 (×4): qty 1

## 2024-02-28 MED ORDER — CEFAZOLIN SODIUM-DEXTROSE 2-4 GM/100ML-% IV SOLN
2.0000 g | Freq: Once | INTRAVENOUS | Status: AC
  Administered 2024-02-28: 2 g via INTRAVENOUS

## 2024-02-28 MED ORDER — PROPOFOL 10 MG/ML IV BOLUS
INTRAVENOUS | Status: DC | PRN
  Administered 2024-02-28: 150 ug/kg/min via INTRAVENOUS

## 2024-02-28 MED ORDER — TORSEMIDE 20 MG PO TABS
20.0000 mg | ORAL_TABLET | Freq: Every day | ORAL | Status: DC
  Administered 2024-02-29 – 2024-03-02 (×3): 20 mg via ORAL
  Filled 2024-02-28 (×3): qty 1

## 2024-02-28 MED ORDER — SODIUM CHLORIDE 1 G PO TABS
1.0000 g | ORAL_TABLET | Freq: Two times a day (BID) | ORAL | Status: DC
  Administered 2024-02-28 – 2024-03-02 (×6): 1 g via ORAL
  Filled 2024-02-28 (×6): qty 1

## 2024-02-28 MED ORDER — DEXAMETHASONE SODIUM PHOSPHATE 10 MG/ML IJ SOLN
INTRAMUSCULAR | Status: DC | PRN
  Administered 2024-02-28: 5 mg via INTRAVENOUS

## 2024-02-28 MED ORDER — FENTANYL CITRATE (PF) 100 MCG/2ML IJ SOLN: 25.0000 ug | INTRAMUSCULAR | Status: DC | PRN

## 2024-02-28 MED ORDER — SODIUM CHLORIDE 1 G PO TABS
1.0000 g | ORAL_TABLET | Freq: Two times a day (BID) | ORAL | Status: DC
Start: 2024-02-29 — End: 2024-02-28

## 2024-02-28 MED ORDER — GABAPENTIN 300 MG PO CAPS
300.0000 mg | ORAL_CAPSULE | Freq: Two times a day (BID) | ORAL | Status: DC
  Administered 2024-02-28 – 2024-03-02 (×6): 300 mg via ORAL
  Filled 2024-02-28 (×6): qty 1

## 2024-02-28 MED ORDER — CEFAZOLIN SODIUM-DEXTROSE 2-4 GM/100ML-% IV SOLN
INTRAVENOUS | Status: AC
  Filled 2024-02-28: qty 100

## 2024-02-28 MED ORDER — HYDRALAZINE HCL 20 MG/ML IJ SOLN
5.0000 mg | INTRAMUSCULAR | Status: DC | PRN
  Administered 2024-02-29: 5 mg via INTRAVENOUS
  Filled 2024-02-28: qty 1

## 2024-02-28 MED ORDER — LACTATED RINGERS IV SOLN: INTRAVENOUS | Status: DC

## 2024-02-28 MED ORDER — ORAL CARE MOUTH RINSE: 15.0000 mL | Freq: Once | OROMUCOSAL | Status: AC

## 2024-02-28 MED ORDER — OXYCODONE HCL 5 MG/5ML PO SOLN: 5.0000 mg | Freq: Once | ORAL | Status: DC | PRN

## 2024-02-28 MED ORDER — AMIODARONE HCL 200 MG PO TABS
200.0000 mg | ORAL_TABLET | Freq: Every day | ORAL | Status: DC
  Filled 2024-02-28: qty 1

## 2024-02-28 MED ORDER — RANOLAZINE ER 500 MG PO TB12
500.0000 mg | ORAL_TABLET | Freq: Two times a day (BID) | ORAL | Status: DC
  Administered 2024-02-28: 500 mg via ORAL
  Filled 2024-02-28: qty 1

## 2024-02-28 MED ORDER — BUPIVACAINE-EPINEPHRINE (PF) 0.25% -1:200000 IJ SOLN
INTRAMUSCULAR | Status: DC | PRN
  Administered 2024-02-28: 30 mL

## 2024-02-28 MED ORDER — APIXABAN 5 MG PO TABS
5.0000 mg | ORAL_TABLET | Freq: Two times a day (BID) | ORAL | Status: DC
Start: 2024-02-29 — End: 2024-03-02
  Administered 2024-02-29 – 2024-03-02 (×5): 5 mg via ORAL
  Filled 2024-02-28 (×5): qty 1

## 2024-02-28 MED ORDER — KETAMINE HCL 50 MG/5ML IJ SOSY
PREFILLED_SYRINGE | INTRAMUSCULAR | Status: AC
  Filled 2024-02-28: qty 5

## 2024-02-28 MED ORDER — DEXMEDETOMIDINE HCL IN NACL 80 MCG/20ML IV SOLN
INTRAVENOUS | Status: DC | PRN
  Administered 2024-02-28: 8 ug via INTRAVENOUS

## 2024-02-28 MED ORDER — ISOSORBIDE MONONITRATE ER 30 MG PO TB24
90.0000 mg | ORAL_TABLET | Freq: Every day | ORAL | Status: DC
  Administered 2024-02-28 – 2024-03-01 (×3): 90 mg via ORAL
  Filled 2024-02-28 (×3): qty 3

## 2024-02-28 MED ORDER — GLYCOPYRROLATE 0.2 MG/ML IJ SOLN
INTRAMUSCULAR | Status: DC | PRN
  Administered 2024-02-28: .2 mg via INTRAVENOUS

## 2024-02-28 MED ORDER — CARVEDILOL 3.125 MG PO TABS
3.1250 mg | ORAL_TABLET | Freq: Two times a day (BID) | ORAL | Status: DC
  Administered 2024-02-28: 3.125 mg via ORAL
  Filled 2024-02-28: qty 1

## 2024-02-28 MED ORDER — EPHEDRINE SULFATE-NACL 50-0.9 MG/10ML-% IV SOSY
PREFILLED_SYRINGE | INTRAVENOUS | Status: DC | PRN
  Administered 2024-02-28: 10 mg via INTRAVENOUS
  Administered 2024-02-28: 5 mg via INTRAVENOUS
  Administered 2024-02-28: 10 mg via INTRAVENOUS

## 2024-02-28 MED ORDER — CHLORHEXIDINE GLUCONATE 0.12 % MT SOLN
15.0000 mL | Freq: Once | OROMUCOSAL | Status: AC
  Administered 2024-02-28: 15 mL via OROMUCOSAL

## 2024-02-28 MED ORDER — RANOLAZINE ER 500 MG PO TB12
500.0000 mg | ORAL_TABLET | Freq: Two times a day (BID) | ORAL | Status: DC
  Administered 2024-02-28 – 2024-03-02 (×6): 500 mg via ORAL
  Filled 2024-02-28 (×6): qty 1

## 2024-02-28 MED ORDER — LIDOCAINE HCL (CARDIAC) PF 100 MG/5ML IV SOSY
PREFILLED_SYRINGE | INTRAVENOUS | Status: DC | PRN
  Administered 2024-02-28: 60 mg via INTRAVENOUS

## 2024-02-28 MED ORDER — CHLORHEXIDINE GLUCONATE 0.12 % MT SOLN
OROMUCOSAL | Status: AC
  Filled 2024-02-28: qty 15

## 2024-02-28 MED ORDER — TRANEXAMIC ACID-NACL 1000-0.7 MG/100ML-% IV SOLN
1000.0000 mg | INTRAVENOUS | Status: AC
  Administered 2024-02-28: 1000 mg via INTRAVENOUS

## 2024-02-28 MED ORDER — 0.9 % SODIUM CHLORIDE (POUR BTL) OPTIME
TOPICAL | Status: DC | PRN
  Administered 2024-02-28: 500 mL

## 2024-02-28 MED ORDER — KETAMINE HCL 50 MG/5ML IJ SOSY
PREFILLED_SYRINGE | INTRAMUSCULAR | Status: DC | PRN
Start: 2024-02-28 — End: 2024-02-28
  Administered 2024-02-28: 10 mg via INTRAVENOUS
  Administered 2024-02-28 (×2): 20 mg via INTRAVENOUS

## 2024-02-28 MED ORDER — ONDANSETRON HCL 4 MG/2ML IJ SOLN
INTRAMUSCULAR | Status: DC | PRN
  Administered 2024-02-28: 4 mg via INTRAVENOUS

## 2024-02-28 MED ORDER — SODIUM CHLORIDE 0.9 % IV SOLN: INTRAVENOUS | Status: AC

## 2024-02-28 MED ORDER — FUROSEMIDE 10 MG/ML IJ SOLN
40.0000 mg | Freq: Once | INTRAMUSCULAR | Status: AC
  Administered 2024-02-28: 40 mg via INTRAVENOUS
  Filled 2024-02-28: qty 4

## 2024-02-28 MED ORDER — ONDANSETRON HCL 4 MG/2ML IJ SOLN: 4.0000 mg | Freq: Three times a day (TID) | INTRAMUSCULAR | Status: DC | PRN

## 2024-02-28 MED ORDER — METHOCARBAMOL 500 MG PO TABS
500.0000 mg | ORAL_TABLET | Freq: Three times a day (TID) | ORAL | Status: DC | PRN
  Administered 2024-02-29 – 2024-03-01 (×2): 500 mg via ORAL
  Filled 2024-02-28 (×2): qty 1

## 2024-02-28 MED ORDER — ATORVASTATIN CALCIUM 80 MG PO TABS
80.0000 mg | ORAL_TABLET | Freq: Every day | ORAL | Status: DC
  Filled 2024-02-28 (×2): qty 1

## 2024-02-28 MED ORDER — GABAPENTIN 300 MG PO CAPS
300.0000 mg | ORAL_CAPSULE | Freq: Two times a day (BID) | ORAL | Status: DC
  Administered 2024-02-28: 300 mg via ORAL
  Filled 2024-02-28: qty 1

## 2024-02-28 MED ORDER — ACETAMINOPHEN 10 MG/ML IV SOLN
INTRAVENOUS | Status: DC | PRN
  Administered 2024-02-28: 1000 mg via INTRAVENOUS

## 2024-02-28 MED ORDER — TRANEXAMIC ACID-NACL 1000-0.7 MG/100ML-% IV SOLN
INTRAVENOUS | Status: AC
  Filled 2024-02-28: qty 100

## 2024-02-28 MED ORDER — CARVEDILOL 3.125 MG PO TABS
3.1250 mg | ORAL_TABLET | Freq: Two times a day (BID) | ORAL | Status: DC
  Administered 2024-02-28 – 2024-03-02 (×5): 3.125 mg via ORAL
  Filled 2024-02-28 (×6): qty 1

## 2024-02-28 MED ORDER — BUPIVACAINE-EPINEPHRINE (PF) 0.25% -1:200000 IJ SOLN
INTRAMUSCULAR | Status: AC
  Filled 2024-02-28: qty 30

## 2024-02-28 MED ORDER — PHENYLEPHRINE HCL-NACL 20-0.9 MG/250ML-% IV SOLN
INTRAVENOUS | Status: DC | PRN
  Administered 2024-02-28: 160 ug via INTRAVENOUS
  Administered 2024-02-28: 40 ug/min via INTRAVENOUS

## 2024-02-28 MED ORDER — TRAZODONE HCL 50 MG PO TABS
50.0000 mg | ORAL_TABLET | Freq: Every day | ORAL | Status: DC
  Administered 2024-02-28 – 2024-03-01 (×3): 50 mg via ORAL
  Filled 2024-02-28 (×3): qty 1

## 2024-02-28 MED ORDER — MORPHINE SULFATE (PF) 2 MG/ML IV SOLN: 0.5000 mg | INTRAVENOUS | Status: DC | PRN

## 2024-02-28 MED ORDER — DOCUSATE SODIUM 100 MG PO CAPS
100.0000 mg | ORAL_CAPSULE | Freq: Two times a day (BID) | ORAL | Status: DC
  Administered 2024-02-28 – 2024-03-02 (×4): 100 mg via ORAL
  Filled 2024-02-28 (×6): qty 1

## 2024-02-28 MED ORDER — DULOXETINE HCL 20 MG PO CPEP
20.0000 mg | ORAL_CAPSULE | Freq: Every day | ORAL | Status: DC
  Filled 2024-02-28 (×2): qty 1

## 2024-02-28 SURGICAL SUPPLY — 36 items
BIT DRILL CANNULATED (DRILL) IMPLANT
BNDG COHESIVE 6X5 TAN ST LF (GAUZE/BANDAGES/DRESSINGS) ×2 IMPLANT
CHLORAPREP W/TINT 26 (MISCELLANEOUS) ×1 IMPLANT
DERMABOND ADVANCED .7 DNX12 (GAUZE/BANDAGES/DRESSINGS) ×1 IMPLANT
DRAPE C-ARM XRAY 36X54 (DRAPES) ×1 IMPLANT
DRAPE SHEET LG 3/4 BI-LAMINATE (DRAPES) ×2 IMPLANT
DRILL CANNULATED (DRILL) ×1 IMPLANT
DRSG OPSITE POSTOP 3X4 (GAUZE/BANDAGES/DRESSINGS) ×1 IMPLANT
ELECT REM PT RETURN 9FT ADLT (ELECTROSURGICAL) IMPLANT
ELECTRODE REM PT RTRN 9FT ADLT (ELECTROSURGICAL) IMPLANT
GLOVE BIOGEL PI IND STRL 8 (GLOVE) ×1 IMPLANT
GLOVE PI ORTHO PRO STRL 7.5 (GLOVE) ×2 IMPLANT
GLOVE PI ORTHO PRO STRL SZ8 (GLOVE) IMPLANT
GLOVE SURG SYN 7.5 E (GLOVE) ×1 IMPLANT
GLOVE SURG SYN 7.5 PF PI (GLOVE) ×1 IMPLANT
GOWN SRG XL LVL 3 NONREINFORCE (GOWNS) IMPLANT
GUIDE PIN ORTH 12X3.2X (PIN) IMPLANT
KIT PATIENT CARE HANA TABLE (KITS) ×1 IMPLANT
MANIFOLD NEPTUNE II (INSTRUMENTS) IMPLANT
MAT ABSORB FLUID 56X50 GRAY (MISCELLANEOUS) ×1 IMPLANT
NDL HYPO 21X1.5 SAFETY (NEEDLE) ×1 IMPLANT
NEEDLE HYPO 21X1.5 SAFETY (NEEDLE) ×1 IMPLANT
NS IRRIG 500ML POUR BTL (IV SOLUTION) ×1 IMPLANT
PACK HIP COMPR (MISCELLANEOUS) ×1 IMPLANT
SCREW CANN 6.5X100 (Screw) IMPLANT
SCREW CANN RVR CUT FLUT 90X16X (Screw) IMPLANT
SLEEVE SCD COMPRESS KNEE MED (STOCKING) ×1 IMPLANT
SUT STRATA 1 CT-1 DLB (SUTURE) ×1 IMPLANT
SUT VIC AB 1 CT1 36 (SUTURE) ×1 IMPLANT
SUT VIC AB 2-0 CT2 27 (SUTURE) ×1 IMPLANT
SUTURE STRATA SPIR 4-0 18 (SUTURE) ×1 IMPLANT
SYR 20ML LL LF (SYRINGE) ×1 IMPLANT
TAPE MICROFOAM 4IN (TAPE) IMPLANT
TRAP FLUID SMOKE EVACUATOR (MISCELLANEOUS) IMPLANT
WASHER 5.5MM STAINLESS STEEL (Washer) IMPLANT
WATER STERILE IRR 1000ML POUR (IV SOLUTION) ×1 IMPLANT

## 2024-02-28 NOTE — Consult Note (Signed)
 Medical Consultation   [name]  ZOX:[id number]  DOB: [birth year]  DOA: 02/28/2024  PCP: Lauro Regulus, MD   Outpatient Specialists:    Requesting physician: -ortho PA, Amador Cunas  Reason for consultation: -Chronic medical issue management  History of Present Illness: Gregory Shepherd is an 78 y.o. male with PMH of hypertension, hyperlipidemia, PVD, CAD with CABG,sCHF, hypothyroidism, depression, BPH, anemia, A-fib on Eliquis, who is admitted by orthopedic team due to left hip fracture. Pt is s/p of surgery today.  We are asked to consult for management of chronic medical issues.  Patient states that he fell 3 days ago, injured his left hip, causing pain in left hip, which is constant, severe, nonradiating.  No loss of consciousness. No chest pain, cough, SOB.  No nausea, vomiting, diarrhea or abdominal pain.  No symptoms of UTI.  Patient strongly denies any head or neck injury.  He refused CT scan of head and neck.  Patient states that after surgery, he only has minimal pain in left hip currently.   Date reviewed, lab, image and vitals: WBC 7.2, GFR > 60, sodium 129, BNP 579.  Temperature normal, blood pressure 156/63, heart rate 56, RR 21, oxygen saturation 93-96% on 2 L oxygen.   CT-left hip 1. Transverse impacted subcapital fracture of the left hip. 2. Prostate enlargement.  EKG:  Not done yet, will get one  Review of Systems:   General: no fevers, chills, no changes in body weight, no changes in appetite Skin: no rash HEENT: no blurry vision, hearing changes or sore throat Pulm: no dyspnea, coughing, wheezing CV: no chest pain, palpitations, shortness of breath Abd: no nausea/vomiting, abdominal pain, diarrhea/constipation GU: no dysuria, hematuria, polyuria Ext: no arthralgias, myalgias Musculoskeletal: Has left hip pain Neuro: no weakness, numbness, or tingling. Has fall.    Past Medical History: Past Medical History:   Diagnosis Date   Carotid artery narrowing    History of blood clots    History of colon polyps    Hyperlipidemia    Hypertension    MI (myocardial infarction) [facility])     Past Surgical History: Past Surgical History:  Procedure Laterality Date   ABDOMINAL AORTOGRAM W/LOWER EXTREMITY N/A 06/04/2019   Procedure: ABDOMINAL AORTOGRAM W/LOWER EXTREMITY;  Surgeon: Runell Gess, MD;  Location: [facility] INVASIVE CV LAB;  Service: Cardiovascular;  Laterality: N/A;   BIOPSY  02/08/2022   Procedure: BIOPSY;  Surgeon: Tressia Danas, MD;  Location: [facility] ENDOSCOPY;  Service: Gastroenterology;;   CARDIAC SURGERY     x2    CARDIOVERSION N/A 08/25/2021   Procedure: CARDIOVERSION;  Surgeon: Chilton Si, MD;  Location: [facility] ENDOSCOPY;  Service: Cardiovascular;  Laterality: N/A;   CARDIOVERSION N/A 11/23/2021   Procedure: CARDIOVERSION;  Surgeon: Vesta Mixer, MD;  Location: [facility] ENDOSCOPY;  Service: Cardiovascular;  Laterality: N/A;   CARDIOVERSION N/A 02/17/2024   Procedure: CARDIOVERSION;  Surgeon: [name], MD;  Location[facility] INVASIVE CV LAB;  Service: Cardiovascular;  Laterality: N/A;   COLONOSCOPY     Age 1. [facility]   COLONOSCOPY WITH PROPOFOL N/A 02/08/2022   Procedure: COLONOSCOPY WITH PROPOFOL;  Surgeon: Tressia Danas, MD;  Location: [facility] ENDOSCOPY;  Service: Gastroenterology;  Laterality: N/A;   CORONARY ARTERY BYPASS GRAFT  1990   x 2 at [facility], redo x 3 @ [facility] approx 1998   ESOPHAGOGASTRODUODENOSCOPY (EGD) WITH PROPOFOL N/A 02/08/2022   Procedure: ESOPHAGOGASTRODUODENOSCOPY (  EGD) WITH PROPOFOL;  Surgeon: Tressia Danas, MD;  Location: [facility] ENDOSCOPY;  Service: Gastroenterology;  Laterality: N/A;   LEFT HEART CATH AND CORS/GRAFTS ANGIOGRAPHY N/A 02/06/2024   Procedure: LEFT HEART CATH AND CORS/GRAFTS ANGIOGRAPHY;  Surgeon: Yvonne Kendall, MD;  Location: [facility] INVASIVE CV LAB;  Service: Cardiovascular;  Laterality: N/A;   PERIPHERAL VASCULAR ATHERECTOMY  Right 06/04/2019   Procedure: PERIPHERAL VASCULAR ATHERECTOMY;  Surgeon: Runell Gess, MD;  Location: [facility] INVASIVE CV LAB;  Service: Cardiovascular;  Laterality: Right;  common iliac   PERIPHERAL VASCULAR INTERVENTION Right 06/04/2019   Procedure: PERIPHERAL VASCULAR INTERVENTION;  Surgeon: Runell Gess, MD;  Location: [facility] INVASIVE CV LAB;  Service: Cardiovascular;  Laterality: Right;  common iliac   POLYPECTOMY  02/08/2022   Procedure: POLYPECTOMY;  Surgeon: Tressia Danas, MD;  Location: [facility] ENDOSCOPY;  Service: Gastroenterology;;   TEE WITHOUT CARDIOVERSION N/A 08/25/2021   Procedure: TRANSESOPHAGEAL ECHOCARDIOGRAM (TEE);  Surgeon: Chilton Si, MD;  Location: [facility] ENDOSCOPY;  Service: Cardiovascular;  Laterality: N/A;   TEE WITHOUT CARDIOVERSION N/A 11/23/2021   Procedure: TRANSESOPHAGEAL ECHOCARDIOGRAM (TEE);  Surgeon: Elease Hashimoto Deloris Ping, MD;  Location: [facility] ENDOSCOPY;  Service: Cardiovascular;  Laterality: N/A;     Allergies:   Allergies  Allergen Reactions   Omeprazole     doesn't work well with eliquis    Other Palpitations    Certain steroids give patient heart palpitations   Allergy to green peppers causes upset stomach      Social History:  reports that he has been smoking cigarettes. He has never used smokeless tobacco. He reports that he does not drink alcohol and does not use drugs.  Family History: Family History  Problem Relation Age of Onset   Heart disease Mother    Heart disease Father    Diabetes Sister    Prostate cancer Neg Hx    Bladder Cancer Neg Hx    Kidney cancer Neg Hx    Colon cancer Neg Hx    Esophageal cancer Neg Hx      Physical Exam: Vitals:   02/28/24 1324 02/28/24 1405 02/28/24 2055 02/28/24 2324  BP:  (!) 156/63 (!) 153/65 (!) 151/69  Pulse: (!) 54 (!) 56 63 68  Resp: 20 18 17 16   Temp:  97.7 F (36.5 C) 97.7 F (36.5 C) 98.2 F (36.8 C)  TempSrc:  Oral    SpO2: 96% 93% 98% 96%     General: Not in acute  distress HEENT:       Eyes: PERRL, EOMI, no scleral icterus.       ENT: No discharge from the ears and nose, no pharynx injection, no tonsillar enlargement.        Neck: No JVD, no bruit, no mass felt. Heme: No neck lymph node enlargement. Cardiac: S1/S2, RRR, No murmurs, No gallops or rubs. Respiratory: No rales, wheezing, rhonchi or rubs. GI: Soft, nondistended, nontender, no rebound pain, no organomegaly, BS present. GU: No hematuria Ext: No pitting leg edema bilaterally. 1+DP/PT pulse bilaterally. Musculoskeletal: has mild pain in surgical site of her left hip Skin: No rashes.  Neuro: Alert, oriented X3, cranial nerves II-XII grossly intact, moves all extremities normally. Psych: Patient is not psychotic, no suicidal or hemocidal ideation.    Data reviewed:  I have personally reviewed following labs and imaging studies Labs:  CBC: Recent Labs  Lab 02/28/24 1910  WBC 7.2  HGB 13.7  HCT 39.1  MCV 91.1  PLT 215    Basic Metabolic Panel: Recent Labs  Lab 02/28/24 1910  NA 129*  K 4.9  CL 99  CO2 23  GLUCOSE 151*  BUN 17  CREATININE 0.93  CALCIUM 8.3*   GFR Estimated Creatinine Clearance: 71.9 mL/min (by C-G formula based on SCr of 0.93 mg/dL). Liver Function Tests: No results for input(s): "AST", "ALT", "ALKPHOS", "BILITOT", "PROT", "ALBUMIN" in the last 168 hours. No results for input(s): "LIPASE", "AMYLASE" in the last 168 hours. No results for input(s): "AMMONIA" in the last 168 hours. Coagulation profile No results for input(s): "INR", "PROTIME" in the last 168 hours.  Cardiac Enzymes: No results for input(s): "CKTOTAL", "CKMB", "CKMBINDEX", "TROPONINI" in the last 168 hours. BNP: Invalid input(s): "POCBNP" CBG: No results for input(s): "GLUCAP" in the last 168 hours. D-Dimer No results for input(s): "DDIMER" in the last 72 hours. Hgb A1c No results for input(s): "HGBA1C" in the last 72 hours. Lipid Profile No results for input(s): "CHOL", "HDL",  "LDLCALC", "TRIG", "CHOLHDL", "LDLDIRECT" in the last 72 hours. Thyroid function studies No results for input(s): "TSH", "T4TOTAL", "T3FREE", "THYROIDAB" in the last 72 hours.  Invalid input(s): "FREET3" Anemia work up No results for input(s): "VITAMINB12", "FOLATE", "FERRITIN", "TIBC", "IRON", "RETICCTPCT" in the last 72 hours. Urinalysis No results found for: "COLORURINE", "APPEARANCEUR", "LABSPEC", "PHURINE", "GLUCOSEU", "HGBUR", "BILIRUBINUR", "KETONESUR", "PROTEINUR", "UROBILINOGEN", "NITRITE", "LEUKOCYTESUR"   Microbiology No results found for this or any previous visit (from the past 240 hours).     Inpatient Medications:   Scheduled Meds:  amiodarone  200 mg Oral Q supper   apixaban  5 mg Oral BID   atorvastatin  80 mg Oral Daily   carvedilol  3.125 mg Oral BID   docusate sodium  100 mg Oral BID   DULoxetine  20 mg Oral Daily   finasteride  5 mg Oral QHS   gabapentin  300 mg Oral BID   isosorbide mononitrate  90 mg Oral Q2000   nicotine  21 mg Transdermal Daily   ranolazine  500 mg Oral BID   sodium chloride  1 g Oral BID WC   torsemide  20 mg Oral Daily   traZODone  50 mg Oral QHS   Continuous Infusions:  sodium chloride 50 mL/hr at 02/28/24 1515     Radiological Exams on Admission: DG HIP UNILAT WITH PELVIS 2-3 VIEWS LEFT Result Date: 02/28/2024 CLINICAL DATA:  Elective surgery.  Left hip pinning. EXAM: DG HIP (WITH OR WITHOUT PELVIS) 2-3V LEFT COMPARISON:  CT left hip 02/27/2024 FINDINGS: Images were performed intraoperatively without the presence of a radiologist. There are 3 new longitudinal screws traversing the left femoral head, neck, and intertrochanteric region, spanning the previously seen proximal left femoral neck fracture. No hardware complication is seen. Total fluoroscopy images: 2 Total fluoroscopy time: 80 seconds Total dose: Radiation Exposure Index (as provided by the fluoroscopic device): 14.9 mGy air Kerma Please see intraoperative findings for  further detail. IMPRESSION: Intraoperative fluoroscopy for left hip pinning. Electronically Signed   By: Neita Garnet M.D.   On: 02/28/2024 13:09   DG C-Arm 1-60 Min-No Report Result Date: 02/28/2024 Fluoroscopy was utilized by the requesting physician.  No radiographic interpretation.   CT HIP LEFT WO CONTRAST Result Date: 02/27/2024 CLINICAL DATA:  Patient fell on 02/18/2024.  Pain and abrasions. EXAM: CT OF THE LEFT HIP WITHOUT CONTRAST TECHNIQUE: Multidetector CT imaging of the left hip was performed according to the standard protocol. Multiplanar CT image reconstructions were also generated. RADIATION DOSE REDUCTION: This exam was performed according to the departmental dose-optimization program which  includes automated exposure control, adjustment of the mA and/or kV according to patient size and/or use of iterative reconstruction technique. COMPARISON:  CT chest abdomen and pelvis 01/24/2024 FINDINGS: Bones/Joint/Cartilage Linear sclerosis along the subcapital region of the left femoral neck with slight deformity and cortical irregularity consistent with impacted minimally displaced fracture. Visualized acetabulum and left hemipelvis appear intact. No focal bone lesion or bone destruction. Ligaments Suboptimally assessed by CT. Muscles and Tendons No intramuscular mass or hematoma. Soft tissues Soft tissues are unremarkable. Incidental note of prostate gland enlargement, measuring 6.3 cm in diameter. IMPRESSION: 1. Transverse impacted subcapital fracture of the left hip. 2. Prostate enlargement. Electronically Signed   By: Burman Nieves M.D.   On: 02/27/2024 19:18    Impression/Recommendations Principal Problem:   Fracture of femoral neck, left, closed (HCC) Active Problems:   CAD (coronary artery disease)   Atrial fibrillation, persistent (HCC)   Chronic combined systolic and diastolic CHF (congestive heart failure) (HCC)   Essential (primary) hypertension   Hyponatremia   Dyslipidemia,  goal LDL below 70   BPH with obstruction/lower urinary tract symptoms   Smoker   Depression    Assessment and Plan: Fracture of femoral neck, left, closed [facility]): s/p of surgery. Has minimal pain currently.   -Pain control: As needed Tylenol, Norco, morphine -As needed Robaxin - PT/OT -fall precaution  CAD (coronary artery disease): No chest pain -Lipitor, Imdur, Ranexa, as needed nitroglycerin  Atrial fibrillation, persistent (HCC): Heart rate 56 -Coreg -Eliquis  Chronic combined systolic and diastolic CHF (congestive heart failure) (HCC): 2d echo on 04/13/2022 showed EF of 45-50% with grade 1 diastolic dysfunction.  Patient does not have leg edema, no SOB, however BNP is elevated at 579.  Patient is at risk of developing CHF exacerbation. -Give 40 mg of Lasix now -Continue home torsemide 20 mg daily   Essential (primary) hypertension -IV hydralazine as needed -Coreg, Imdur, torsemide  Hyponatremia: Sodium 129, mental status normal. -Fluid restriction 1200 -Sodium chloride tablet 1 g twice daily  Dyslipidemia, goal LDL below 70 -Lipitor  BPH with obstruction/lower urinary tract symptoms -Proscar  Smoker -Nicotine patch  Depression -Continue home medications         Thank you for this consultation.  [facility] hospitalist team will follow the patient with you.  Time Spent:  35 min     Lorretta Harp M.D. [facility] 02/29/2024, 12:01 AM

## 2024-02-28 NOTE — Transfer of Care (Signed)
 Immediate Anesthesia Transfer of Care Note  Patient: Gregory Shepherd  Procedure(s) Performed: PINNING, EXTREMITY, PERCUTANEOUS (Left: Leg Upper)  Patient Location: PACU  Anesthesia Type:General  Level of Consciousness: drowsy and patient cooperative  Airway & Oxygen Therapy: Patient Spontanous Breathing and Patient connected to nasal cannula oxygen  Post-op Assessment: Report given to RN and Post -op Vital signs reviewed and stable  Post vital signs: stable  Last Vitals:  Vitals Value Taken Time  BP 95/45 02/28/24 1133  Temp    Pulse 54 02/28/24 1136  Resp 16 02/28/24 1136  SpO2 94 % 02/28/24 1136  Vitals shown include unfiled device data.  Last Pain:  Vitals:   02/28/24 0949  TempSrc: Temporal  PainSc: 0-No pain         Complications: No notable events documented.

## 2024-02-28 NOTE — H&P (Signed)
 Subjective:  Chief complaint: Left hip pain/left femoral neck fracture.  The patient is a 78 y.o. male who sustained an injury to the left hip.  He has a PMH of CAD/CM/CABG/EF 35%, A. fib/cardioversion, DM 2, HTN, PVD, HLD who reports falling on 02/18/2024 suffering abrasions to his elbows, rib pain but also head abrasion and that he landed on his left hip.  Since that time he has had pain with any ambulation difficulty mobilizing.  Most of the pain is localized to his left groin and anterior thigh.  He denies any pre-existing hip pain prior to the fall.  Currently denies any shortness of breath chest pain, nausea, vomiting or other injury.  He has dressings to his left hand and arm from the prior injuries which are clean and dry.   Patient Active Problem List   Diagnosis Date Noted   Hypothyroidism 12/20/2022   Degenerative disc disease, cervical 10/12/2022   Smoker 08/24/2022   Left carpal tunnel syndrome 07/14/2022   Ulnar neuropathy at elbow, left 07/14/2022   Pronation deformity of ankle, acquired 05/11/2022   Left hand weakness 02/12/2022   Gastritis and gastroduodenitis    Melena    Microcytic anemia    Atrial fibrillation, persistent (HCC)    Shortness of breath 06/19/2021   Hyperlipemia 06/19/2021   Chronic GERD 06/19/2021   Bilateral lower extremity edema 01/14/2021   Claudication in peripheral vascular disease (HCC) 06/04/2019   Dyslipidemia, goal LDL below 70 02/20/2019   Peripheral arterial disease (HCC) 02/03/2019   Diabetes (HCC) 01/04/2018   Nocturia 01/04/2018   Right leg pain 12/13/2017   Health care maintenance 07/17/2015   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   Essential (primary) hypertension 06/30/2014   Polyp of colon 06/30/2014   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   Incomplete emptying of bladder 08/10/2013   Nodular prostate with urinary obstruction 08/10/2013   Prostate  nodule 08/10/2013   Past Medical History:  Diagnosis Date   Carotid artery narrowing    History of blood clots    History of colon polyps    Hyperlipidemia    Hypertension    MI (myocardial infarction) [facility])     Past Surgical History:  Procedure Laterality Date   ABDOMINAL AORTOGRAM W/LOWER EXTREMITY N/A 06/04/2019   Procedure: ABDOMINAL AORTOGRAM W/LOWER EXTREMITY;  Surgeon: Runell Gess, MD;  Location: [facility] INVASIVE CV LAB;  Service: Cardiovascular;  Laterality: N/A;   BIOPSY  02/08/2022   Procedure: BIOPSY;  Surgeon: Tressia Danas, MD;  Location: [facility] ENDOSCOPY;  Service: Gastroenterology;;   CARDIAC SURGERY     x2    CARDIOVERSION N/A 08/25/2021   Procedure: CARDIOVERSION;  Surgeon: Chilton Si, MD;  Location: [facility] ENDOSCOPY;  Service: Cardiovascular;  Laterality: N/A;   CARDIOVERSION N/A 11/23/2021   Procedure: CARDIOVERSION;  Surgeon: Vesta Mixer, MD;  Location: [facility] ENDOSCOPY;  Service: Cardiovascular;  Laterality: N/A;   CARDIOVERSION N/A 02/17/2024   Procedure: CARDIOVERSION;  Surgeon: [name], MD;  Location: [facility] INVASIVE CV LAB;  Service: Cardiovascular;  Laterality: N/A;   COLONOSCOPY     Age 33. [facility]   COLONOSCOPY WITH PROPOFOL N/A 02/08/2022   Procedure: COLONOSCOPY WITH PROPOFOL;  Surgeon: Tressia Danas, MD;  Location: [facility] ENDOSCOPY;  Service: Gastroenterology;  Laterality: N/A;   CORONARY ARTERY BYPASS GRAFT  1990   x 2 at [facility], redo x 3 @ [facility] approx 1998   ESOPHAGOGASTRODUODENOSCOPY (EGD) WITH PROPOFOL  N/A 02/08/2022   Procedure: ESOPHAGOGASTRODUODENOSCOPY (EGD) WITH PROPOFOL;  Surgeon: Tressia Danas, MD;  Location: [facility] ENDOSCOPY;  Service: Gastroenterology;  Laterality: N/A;   LEFT HEART CATH AND CORS/GRAFTS ANGIOGRAPHY N/A 02/06/2024   Procedure: LEFT HEART CATH AND CORS/GRAFTS ANGIOGRAPHY;  Surgeon: Yvonne Kendall, MD;  Location: [facility] INVASIVE CV LAB;  Service: Cardiovascular;  Laterality: N/A;   PERIPHERAL  VASCULAR ATHERECTOMY Right 06/04/2019   Procedure: PERIPHERAL VASCULAR ATHERECTOMY;  Surgeon: Runell Gess, MD;  Location: [facility] INVASIVE CV LAB;  Service: Cardiovascular;  Laterality: Right;  common iliac   PERIPHERAL VASCULAR INTERVENTION Right 06/04/2019   Procedure: PERIPHERAL VASCULAR INTERVENTION;  Surgeon: Runell Gess, MD;  Location: [facility] INVASIVE CV LAB;  Service: Cardiovascular;  Laterality: Right;  common iliac   POLYPECTOMY  02/08/2022   Procedure: POLYPECTOMY;  Surgeon: Tressia Danas, MD;  Location: [facility] ENDOSCOPY;  Service: Gastroenterology;;   TEE WITHOUT CARDIOVERSION N/A 08/25/2021   Procedure: TRANSESOPHAGEAL ECHOCARDIOGRAM (TEE);  Surgeon: Chilton Si, MD;  Location: [facility] ENDOSCOPY;  Service: Cardiovascular;  Laterality: N/A;   TEE WITHOUT CARDIOVERSION N/A 11/23/2021   Procedure: TRANSESOPHAGEAL ECHOCARDIOGRAM (TEE);  Surgeon: Vesta Mixer, MD;  Location: [facility] ENDOSCOPY;  Service: Cardiovascular;  Laterality: N/A;    Medications Prior to Admission  Medication Sig Dispense Refill Last Dose/Taking   acetaminophen (TYLENOL) 500 MG tablet Take 1,000 mg by mouth every 6 (six) hours as needed for moderate pain (pain score 4-6).      amiodarone (PACERONE) 200 MG tablet Take 200 mg by mouth daily.      atorvastatin (LIPITOR) 80 MG tablet Take 80 mg by mouth daily.      carvedilol (COREG) 3.125 MG tablet TAKE ONE TABLET BY MOUTH TWICE A DAY 180 tablet 1    Cholecalciferol (VITAMIN D) 50 MCG (2000 UT) CAPS Take 4,000 Units by mouth daily.      DULoxetine (CYMBALTA) 20 MG capsule Take 1 capsule (20 mg total) by mouth daily. 90 capsule 3    ELIQUIS 5 MG TABS tablet TAKE ONE TABLET BY MOUTH TWICE A DAY 180 tablet 1    ferrous sulfate 325 (65 FE) MG tablet Take 325 mg by mouth daily.      finasteride (PROSCAR) 5 MG tablet Take 1 tablet (5 mg total) by mouth daily. (Patient taking differently: Take 5 mg by mouth at bedtime.) 90 tablet 0    gabapentin (NEURONTIN) 300 MG capsule Take  300 mg by mouth 2 (two) times daily.      isosorbide mononitrate (IMDUR) 60 MG 24 hr tablet Take 1.5 tablets (90 mg total) by mouth daily. 135 tablet 3    lansoprazole (PREVACID) 30 MG capsule TAKE ONE CAPSULE BY MOUTH DAILY 90 capsule 1    levothyroxine (SYNTHROID) 75 MCG tablet Take 75 mcg by mouth daily before breakfast.      Multiple Vitamin (MULTIVITAMIN) tablet Take 1 tablet by mouth daily.      nitroGLYCERIN (NITROSTAT) 0.4 MG SL tablet PLACE ONE TABLET UNDER THE TONGUE AS NEEDED FOR CHEST PAIN 25 tablet 11    potassium chloride (KLOR-CON) 10 MEQ tablet TAKE TWO TABLETS BY MOUTH TWICE A DAY (Patient taking differently: Take 10 mEq by mouth daily.) 180 tablet 3    pyridOXINE (VITAMIN B6) 100 MG tablet Take 100 mg by mouth 2 (two) times daily.      ranolazine (RANEXA) 500 MG 12 hr tablet Take 1 tablet (500 mg total) by mouth 2 (two) times daily. 60 tablet 5    torsemide (DEMADEX) 20  MG tablet Take 1 tablet (20 mg total) by mouth 2 (two) times daily. (Patient taking differently: Take 20 mg by mouth daily.) 180 tablet 3    traZODone (DESYREL) 50 MG tablet Take 50 mg by mouth at bedtime.      vitamin B-12 (CYANOCOBALAMIN) 1000 MCG tablet Take 1,000 mcg by mouth daily.      vitamin C (ASCORBIC ACID) 500 MG tablet Take 500 mg by mouth 2 (two) times daily.      Allergies  Allergen Reactions   Omeprazole     doesn't work well with eliquis    Other Palpitations    Certain steroids give patient heart palpitations   Allergy to green peppers causes upset stomach     Social History   Tobacco Use   Smoking status: Every Day    Types: Cigarettes   Smokeless tobacco: Never   Tobacco comments:    8-9 cigarettes per day  Substance Use Topics   Alcohol use: No    Family History  Problem Relation Age of Onset   Heart disease Mother    Heart disease Father    Diabetes Sister    Prostate cancer Neg Hx    Bladder Cancer Neg Hx    Kidney cancer Neg Hx    Colon cancer Neg Hx    Esophageal  cancer Neg Hx      Review of Systems: As noted above. The patient denies any chest pain, shortness of breath, nausea, vomiting, diarrhea, constipation, belly pain, blood in his/her stool, or burning with urination.  Objective:    Physical Exam: General:  Alert, no acute distress Psychiatric:  Patient is competent for consent with normal mood and affect Cardiovascular:  RRR  Respiratory:  Clear to auscultation. No wheezing. Non-labored breathing GI:  Abdomen is soft and non-tender Skin:  No lesions in the area of chief complaint Neurologic:  Sensation intact distally Lymphatic:  No axillary or cervical lymphadenopathy  Orthopedic Exam:  Left lower extremity Skin intact over the hip Tender to palpation over the lateral aspect of the hip and pain with any motion of the leg positive logroll pain with gentle axial load localized to the groin No tenderness palpation over the knee, distal femur, tibia, ankle or foot. Are some healing old abrasions noted over the lower leg no active bleeding or signs of infection Compartments all soft neurovascular intact to the foot able dorsiflex and plantarflex the foot and toes  Imaging Review: X-rays performed yesterday in the clinic and CT scan in the system images and report reviewed myself show an impacted left femoral neck fracture no other fractures noted minimal degenerative changes noted to the hip.  Agree with radiologist interpretation  Assessment: Left femoral neck fracture  Plan: The patient is a 78 year old male with an impacted left femoral neck fracture.  We discussed the treatment options including nonoperative and operative interventions for this hip fracture and under shared decision making model the patient agrees the plan move forward with a left hip surgical intervention.  The patient will need a left hip closed reduction percutaneous pinning.  We discussed the risk of pinning failure and the possible need for future conversion to  hip replacement patient agrees with plan for hip pinning.  A long discussion took place with the patient describing what a hip pinning is and what the procedure would entail. The xrays were reviewed with the patient and the implants were discussed. The ability to secure the implant utilizing screw fixation was discussed. Surgical  exposures were discussed with the patient.    The hospitalization and post-operative care and rehabilitation were also discussed. The use of perioperative antibiotics and DVT prophylaxis were discussed. The risk, benefits and alternatives to a surgical intervention were discussed at length with the patient. The patient was also advised of risks related to the medical comorbidities. A lengthy discussion took place to review the most common complications including but not limited to: deep vein thrombosis, pulmonary embolus, heart attack, stroke, infection, wound breakdown, dislocation, numbness, leg length in-equality, damage to nerves, intraoperative fracture, screw cut out, screw breakage, malunion/ nonunion, tendon,muscles, arteries or other blood vessels, death and other possible complications from anesthesia. The patient was told that we will take steps to minimize these risks by using sterile technique, antibiotics and DVT prophylaxis when appropriate and follow the patient postoperatively in the office setting to monitor progress. The possibility of recurrent pain, no improvement in pain and actual worsening of pain were also discussed with the patient.  We discussed his conversation about his risk of hematoma given the recent Eliquis use.     The benefits of surgery were discussed with the patient including the potential for improving the patient's current clinical condition through operative intervention. Alternatives to surgical intervention including conservative management were also discussed in detail. All questions were answered to the satisfaction of the patient. The  patient participated and agreed to the plan of care as well as the use of the recommended implants for their surgery.    Plan for surgery today 02/28/2024 Will consult medical team for assistance with management of chronic medical conditions and will continue home medications and Eliquis postoperatively.  Patient recently seen for cardiac evaluation and treatment and is stable with regards to cardiac symptoms on exam today.   Reinaldo Berber MD

## 2024-02-28 NOTE — Anesthesia Preprocedure Evaluation (Signed)
 Anesthesia Evaluation  Patient identified by MRN, date of birth, ID band Patient awake    Reviewed: Allergy & Precautions, NPO status , Patient's Chart, lab work & pertinent test results  Airway Mallampati: III  TM Distance: >3 FB Neck ROM: full    Dental  (+) Chipped, Dental Advidsory Given   Pulmonary neg pulmonary ROS, Current Smoker and Patient abstained from smoking.   Pulmonary exam normal        Cardiovascular hypertension, + CAD, + Past MI, + Peripheral Vascular Disease and +CHF  Normal cardiovascular exam+ dysrhythmias      Neuro/Psych negative neurological ROS  negative psych ROS   GI/Hepatic Neg liver ROS,GERD  Medicated and Controlled,,  Endo/Other  diabetesHypothyroidism    Renal/GU negative Renal ROS  negative genitourinary   Musculoskeletal   Abdominal   Peds  Hematology negative hematology ROS (+)   Anesthesia Other Findings Discussed case with surgeon. States he could try doing the case under local and sedation due to patient's cardiac history. Will plan on LMA as back up. Patient made aware of plan. Agrees to proceed.   Past Medical History: No date: Carotid artery narrowing No date: History of blood clots No date: History of colon polyps No date: Hyperlipidemia No date: Hypertension No date: MI (myocardial infarction) [facility])  Past Surgical History: 06/04/2019: ABDOMINAL AORTOGRAM W/LOWER EXTREMITY; N/A     Comment:  Procedure: ABDOMINAL AORTOGRAM W/LOWER EXTREMITY;                Surgeon: Runell Gess, MD;  Location: [facility] INVASIVE CV              LAB;  Service: Cardiovascular;  Laterality: N/A; 02/08/2022: BIOPSY     Comment:  Procedure: BIOPSY;  Surgeon: Tressia Danas, MD;                Location: [facility] ENDOSCOPY;  Service: Gastroenterology;; No date: CARDIAC SURGERY     Comment:  x2  08/25/2021: CARDIOVERSION; N/A     Comment:  Procedure: CARDIOVERSION;  Surgeon: Chilton Si,                MD;  Location: [facility] ENDOSCOPY;  Service: Cardiovascular;                Laterality: N/A; 11/23/2021: CARDIOVERSION; N/A     Comment:  Procedure: CARDIOVERSION;  Surgeon: Vesta Mixer,               MD;  Location: [facility] ENDOSCOPY;  Service: Cardiovascular;                Laterality: N/A; 02/17/2024: CARDIOVERSION; N/A     Comment:  Procedure: CARDIOVERSION;  Surgeon: [name], MD;  Location: [facility] INVASIVE CV LAB;                Service: Cardiovascular;  Laterality: N/A; No date: COLONOSCOPY     Comment:  Age 6. [facility] 02/08/2022: COLONOSCOPY WITH PROPOFOL; N/A     Comment:  Procedure: COLONOSCOPY WITH PROPOFOL;  Surgeon: Tressia Danas, MD;  Location: [facility] ENDOSCOPY;  Service:               Gastroenterology;  Laterality: N/A; 1990: CORONARY ARTERY BYPASS GRAFT     Comment:  x 2 at [facility], redo x 3 @ [facility] approx 1998 02/08/2022: ESOPHAGOGASTRODUODENOSCOPY (  EGD) WITH PROPOFOL; N/A     Comment:  Procedure: ESOPHAGOGASTRODUODENOSCOPY (EGD) WITH               PROPOFOL;  Surgeon: Tressia Danas, MD;  Location: [facility]               ENDOSCOPY;  Service: Gastroenterology;  Laterality: N/A; 02/06/2024: LEFT HEART CATH AND CORS/GRAFTS ANGIOGRAPHY; N/A     Comment:  Procedure: LEFT HEART CATH AND CORS/GRAFTS ANGIOGRAPHY;               Surgeon: Yvonne Kendall, MD;  [facility]: [facility] INVASIVE CV               LAB;  Service: Cardiovascular;  Laterality: N/A; 06/04/2019: PERIPHERAL VASCULAR ATHERECTOMY; Right     Comment:  Procedure: PERIPHERAL VASCULAR ATHERECTOMY;  Surgeon:               Runell Gess, MD;  Location: [facility] INVASIVE CV LAB;                Service: Cardiovascular;  Laterality: Right;  common               iliac 06/04/2019: PERIPHERAL VASCULAR INTERVENTION; Right     Comment:  Procedure: PERIPHERAL VASCULAR INTERVENTION;  Surgeon:               Runell Gess, MD;  Location: [facility] INVASIVE CV LAB;                Service:  Cardiovascular;  Laterality: Right;  common               iliac 02/08/2022: POLYPECTOMY     Comment:  Procedure: POLYPECTOMY;  Surgeon: Tressia Danas, MD;              Location: [facility] ENDOSCOPY;  Service: Gastroenterology;; 08/25/2021: TEE WITHOUT CARDIOVERSION; N/A     Comment:  Procedure: TRANSESOPHAGEAL ECHOCARDIOGRAM (TEE);                Surgeon: Chilton Si, MD;  Location: [facility] ENDOSCOPY;               Service: Cardiovascular;  Laterality: N/A; 11/23/2021: TEE WITHOUT CARDIOVERSION; N/A     Comment:  Procedure: TRANSESOPHAGEAL ECHOCARDIOGRAM (TEE);                Surgeon: Elease Hashimoto Deloris Ping, MD;  Location: [facility] ENDOSCOPY;                Service: Cardiovascular;  Laterality: N/A;     Reproductive/Obstetrics negative OB ROS                             Anesthesia Physical Anesthesia Plan  ASA: 3  Anesthesia Plan: General   Post-op Pain Management: Ofirmev IV (intra-op)*   Induction: Intravenous  PONV Risk Score and Plan: 2 and Ondansetron, TIVA and Propofol infusion  Airway Management Planned: Natural Airway and Nasal Cannula  Additional Equipment:   Intra-op Plan:   Post-operative Plan:   Informed Consent: I have reviewed the patients History and Physical, chart, labs and discussed the procedure including the risks, benefits and alternatives for the proposed anesthesia with the patient or authorized representative who has indicated his/her understanding and acceptance.     Dental Advisory Given  Plan Discussed with: Anesthesiologist, CRNA and Surgeon  Anesthesia Plan Comments: (Patient consented for risks of anesthesia including but not limited to:  - adverse reactions to medications -  risk of airway placement if required - damage to eyes, teeth, lips or other oral mucosa - nerve damage due to positioning  - sore throat or hoarseness - Damage to heart, brain, nerves, lungs, other parts of body or loss of life  Patient voiced  understanding and assent.)       Anesthesia Quick Evaluation

## 2024-02-28 NOTE — Op Note (Signed)
 Patient Name: Gregory Shepherd  MRN: [id number]   Pre-Operative Diagnosis: left hip impacted femoral neck fracture  Post-Operative Diagnosis: (same)  Procedure: left hip closed reduction percutaneous pinning  Components/Implants: Zimmer 6.19mm cannulated stainless screws, 100/90/90 with one washer  Date of Surgery: 02/28/2024  Surgeon: Reinaldo Berber MD  Assistant: None   Anesthesiologist: Lorette Ang  Anesthesia: General with local  EBL: 10cc  IVF: 200cc  Complications: None   Brief history: The patient is a 78 year old male who was found on outpatient imaging to have an impacted left femoral neck fracture.  Patient had recent medical and cardiac workup and a recent cardiac ablation and was found to be preoperatively optimized to undergo urgent fixation of his left hip fracture.  He was admitted direct to the surgery department and recent labs were reviewed and the patient was examined by both myself and the anesthesia team.   A thorough discussion was had with the patient and family about the risks and benefits of surgical intervention for their hip fracture as definitive treatment.  The patient and family opted to proceed with the operation.  All preoperative films were reviewed and an appropriate surgical plan was made prior to surgery.   Description of procedure: The patient was brought to the operating room where laterality was confirmed by all those present to be the left side.  The patient was administered anesthesia on a stretcher prior to being moved supine on the operating room table. Patient was given an intravenous dose of antibiotics for surgical prophylaxis..  All bony prominences and extremities were well padded and the patient was securely attached to the table boots, a perineal post was placed and the patient had a safety strap placed.  Surgical site was prepped with alcohol and chlorhexidine. The surgical site over the hip was and draped in typical sterile fashion with  multiple layers of adhesive and nonadhesive drapes.  The incision site was marked out with a sterile marker under fluoroscopic guidance.    A surgical timeout was then called with participation of all staff in the room the patient was then a confirmed again and laterality confirmed.  An injection with quarter percent Marcaine with epi was placed in the area of the incision prior to incision.  An incision was made just distal to the lesser trochanter over the lateral thigh through the skin and subcutaneous tissues. An incision was then made in the IT band in line with the skin incision and a tonsil clamp was used to spread the deep tissues over the lateral femur. A guidewire was then inserted under fluoroscopic guidance through the lateral cortex of the femur and into the inferior femoral neck just proximal to the calcar to the level of subchondral bone. The position was confirmed on lateral imaging to be centered in the femoral neck. A wire guide was then used to apply to additional guidewires in an inverted triangular pattern superior anterior and superior posterior in the femoral neck. The wires were confirmed on orthogonal fluoroscopic imaging and then measured for length.   The lateral cortex was opened around the guidewires using a cannulated drill and the screws were applied first inferior with a washer placed and then the two superior screws were placed. The placement of the screws was checked again on orthogonal imaging with fluoro and the guidewires were removed. The screws were found to be of approriate length without any penetration of the femoral head with the inferior screw at or above the level of the lesser trochanter to  reduce the risk of fracture. The wound was then irrigated thoroughly and the fascia and subcutaneous tissues were injected with .25% marcaine with epi.   The fascia was closed with 1 Vicryl interrupted figure-of-eight sutures.  The subcutaneous tissues were closed with 2-0  Vicryl and the skin closed with 4-0 STRATAFIX and Dermabond.  A Sterile dressing was applied to the incision.   The patient was awoken from anesthesia transferred off of the operating room table onto a hospital bed.  The patient had a good pulse postoperatively in the foot . the patient was then transferred to the PACU in stable condition.

## 2024-02-29 ENCOUNTER — Encounter: Payer: Self-pay | Admitting: Orthopedic Surgery

## 2024-02-29 DIAGNOSIS — F32A Depression, unspecified: Secondary | ICD-10-CM

## 2024-02-29 DIAGNOSIS — I4819 Other persistent atrial fibrillation: Secondary | ICD-10-CM | POA: Diagnosis not present

## 2024-02-29 DIAGNOSIS — E871 Hypo-osmolality and hyponatremia: Secondary | ICD-10-CM

## 2024-02-29 DIAGNOSIS — S72002A Fracture of unspecified part of neck of left femur, initial encounter for closed fracture: Secondary | ICD-10-CM | POA: Diagnosis not present

## 2024-02-29 DIAGNOSIS — I951 Orthostatic hypotension: Secondary | ICD-10-CM

## 2024-02-29 DIAGNOSIS — I5042 Chronic combined systolic (congestive) and diastolic (congestive) heart failure: Secondary | ICD-10-CM | POA: Diagnosis not present

## 2024-02-29 LAB — CBC
HCT: 36.3 % — ABNORMAL LOW (ref 39.0–52.0)
Hemoglobin: 13 g/dL (ref 13.0–17.0)
MCH: 32 pg (ref 26.0–34.0)
MCHC: 35.8 g/dL (ref 30.0–36.0)
MCV: 89.4 fL (ref 80.0–100.0)
Platelets: 245 10*3/uL (ref 150–400)
RBC: 4.06 MIL/uL — ABNORMAL LOW (ref 4.22–5.81)
RDW: 13.3 % (ref 11.5–15.5)
WBC: 11.8 10*3/uL — ABNORMAL HIGH (ref 4.0–10.5)
nRBC: 0 % (ref 0.0–0.2)

## 2024-02-29 LAB — BASIC METABOLIC PANEL
Anion gap: 8 (ref 5–15)
BUN: 17 mg/dL (ref 8–23)
CO2: 21 mmol/L — ABNORMAL LOW (ref 22–32)
Calcium: 8.3 mg/dL — ABNORMAL LOW (ref 8.9–10.3)
Chloride: 102 mmol/L (ref 98–111)
Creatinine, Ser: 0.89 mg/dL (ref 0.61–1.24)
GFR, Estimated: >60 mL/min (ref >60)
Glucose, Bld: 101 mg/dL — ABNORMAL HIGH (ref 70–99)
Potassium: 4 mmol/L (ref 3.5–5.1)
Sodium: 131 mmol/L — ABNORMAL LOW (ref 135–145)

## 2024-02-29 MED ORDER — LACTATED RINGERS IV BOLUS
500.0000 mL | Freq: Once | INTRAVENOUS | Status: AC
  Administered 2024-02-29: 500 mL via INTRAVENOUS

## 2024-02-29 MED ORDER — ADULT MULTIVITAMIN W/MINERALS CH
1.0000 | ORAL_TABLET | Freq: Every day | ORAL | Status: DC
  Administered 2024-02-29 – 2024-03-02 (×3): 1 via ORAL
  Filled 2024-02-29 (×3): qty 1

## 2024-02-29 MED ORDER — ATORVASTATIN CALCIUM 80 MG PO TABS
80.0000 mg | ORAL_TABLET | Freq: Every day | ORAL | Status: DC
  Administered 2024-02-29 – 2024-03-01 (×2): 80 mg via ORAL
  Filled 2024-02-29 (×2): qty 1

## 2024-02-29 MED ORDER — DULOXETINE HCL 20 MG PO CPEP
20.0000 mg | ORAL_CAPSULE | Freq: Every day | ORAL | Status: DC
  Administered 2024-02-29 – 2024-03-01 (×2): 20 mg via ORAL
  Filled 2024-02-29 (×3): qty 1

## 2024-02-29 MED ORDER — ACETAMINOPHEN 325 MG PO TABS: 650.0000 mg | ORAL_TABLET | Freq: Four times a day (QID) | ORAL | Status: DC | PRN

## 2024-02-29 MED ORDER — DOCUSATE SODIUM 100 MG PO CAPS: 100.0000 mg | ORAL_CAPSULE | Freq: Two times a day (BID) | ORAL | 0 refills | Status: DC

## 2024-02-29 MED ORDER — HYDROCODONE-ACETAMINOPHEN 5-325 MG PO TABS: 1.0000 | ORAL_TABLET | Freq: Four times a day (QID) | ORAL | 0 refills | Status: DC | PRN

## 2024-02-29 MED ORDER — ENSURE ENLIVE PO LIQD
237.0000 mL | Freq: Two times a day (BID) | ORAL | Status: DC
  Administered 2024-02-29 – 2024-03-02 (×3): 237 mL via ORAL

## 2024-02-29 NOTE — Progress Notes (Addendum)
 Consultation Progress Note   Patient: Gregory Shepherd ZOX:[id number] DOB: Jan 06, 1946 DOA: 02/28/2024 DOS: the patient was seen and examined on 02/29/2024 Primary service: Reinaldo Berber, MD  Brief hospital course: Taken from consult note.  Gregory Shepherd is an 78 y.o. male with PMH of hypertension, hyperlipidemia, PVD, CAD with CABG,sCHF, hypothyroidism, depression, BPH, anemia, A-fib on Eliquis, who is admitted by orthopedic team due to left hip fracture due to mechanical fall 3 days ago. Pt is s/p of surgery .  We are asked to consult for management of chronic medical issues.   Patient was on 2 L of oxygen but saturating well, rest of the vitals and labs stable.  Pertinent labs of sodium 129 and BNP of 579.  3/26: Mildly elevated blood pressure at 166/68, sodium with some improvement to 131, CO2 of 21.  Mild leukocytosis likely reactive due to surgery.  Concern of urinary retention overnight requiring one-time in and out catheter.  Positive orthostatic vitals with some dizziness while working with PT so giving 500 cc bolus.  PT is recommending SNF  Addendum.  Patient unable to urinate despite multiple attempts.  Worsening lower abdominal discomfort and bladder scan with more than 800 cc. Foley catheter is being placed-likely will need outpatient urology evaluation for voiding trial.  Assessment and Plan: * Fracture of femoral neck, left, closed (HCC) S/p ORIF.  Pain seems well-controlled. PT and OT are recommending SNF -TOC to find placement -Continue with pain control and supportive care  Orthostatic hypotension Patient with positive orthostasis while working with PT, he became dizzy with systolic blood pressure decreased to 61. -Ordered 500 cc bolus -Monitor blood pressure  CAD (coronary artery disease) No chest pain. -Continue with Lipitor, Imdur, Ranexa and as needed nitroglycerin  Chronic combined systolic and diastolic CHF (congestive heart failure) (HCC) 2d echo on  04/13/2022 showed EF of 45-50% with grade 1 diastolic dysfunction.  Patient does not have leg edema, no SOB, however BNP is elevated at 579.  Patient is at risk of developing CHF exacerbation.  Received 1 dose of IV Lasix yesterday -Continue with home torsemide  Atrial fibrillation, persistent (HCC) Heart rate seems well-controlled -Continue Coreg and Eliquis  Essential (primary) hypertension Patient had orthostatic vitals today.  Will -Already received Coreg, Imdur and torsemide which we can continue unless he becomes very symptomatic. -Will repeat orthostatic vitals tomorrow  Hyponatremia Sodium is of improvement to 131. -Continue with fluid restriction -Continue with salt tablet  Dyslipidemia, goal LDL below 70 -Continue with Lipitor  BPH with obstruction/lower urinary tract symptoms Patient did develop some urinary retention overnight requiring In-N-Out catheter x 1. -Encouraged voiding and check periodic bladder scan -Continue with home finasteride  Smoker -Nicotine patch  Depression -Continue home meds    TRH will continue to follow the patient.  Subjective: Patient was seen and examined today.  He developed some dizziness while working with PT, currently in recliner and appears comfortable.  Pain seems well-controlled.  Physical Exam: Vitals:   02/29/24 0457 02/29/24 0506 02/29/24 0749 02/29/24 0949  BP: (!) 141/61  (!) 166/68   Pulse: 61 62 61 62  Resp: 18  16   Temp: 97.9 F (36.6 C)  (!) 97.5 F (36.4 C)   TempSrc: Oral     SpO2:  95% 98% 95%   General.  Frail elderly gentleman, in no acute distress. Pulmonary.  Lungs clear bilaterally, normal respiratory effort. CV.  Regular rate and rhythm, no JVD, rub or murmur. Abdomen.  Soft, nontender, nondistended,  BS positive. CNS.  Alert and oriented .  No focal neurologic deficit. Extremities.  No edema, no cyanosis, pulses intact and symmetrical. Psychiatry.  Judgment and insight appears normal.   Data  Reviewed: Prior data reviewed  Family Communication:   Time spent: 45 minutes.  This record has been created using Conservation officer, historic buildings. Errors have been sought and corrected,but may not always be located. Such creation errors do not reflect on the standard of care.   Author: Arnetha Courser, MD 02/29/2024 2:43 PM  For on call review [email or web address].

## 2024-02-29 NOTE — Hospital Course (Addendum)
 Taken from consult note.  Jayjay Littles Batalla is an 78 y.o. male with PMH of hypertension, hyperlipidemia, PVD, CAD with CABG,sCHF, hypothyroidism, depression, BPH, anemia, A-fib on Eliquis, who is admitted by orthopedic team due to left hip fracture due to mechanical fall 3 days ago. Pt is s/p of surgery .  We are asked to consult for management of chronic medical issues.   Patient was on 2 L of oxygen but saturating well, rest of the vitals and labs stable.  Pertinent labs of sodium 129 and BNP of 579.  3/26: Mildly elevated blood pressure at 166/68, sodium with some improvement to 131, CO2 of 21.  Mild leukocytosis likely reactive due to surgery.  Concern of urinary retention overnight requiring one-time in and out catheter.  Positive orthostatic vitals with some dizziness while working with PT so giving 500 cc bolus.  PT is recommending SNF  Addendum.  Patient unable to urinate despite multiple attempts.  Worsening lower abdominal discomfort and bladder scan with more than 800 cc. Foley catheter is being placed-likely will need outpatient urology evaluation for voiding trial.

## 2024-02-29 NOTE — Assessment & Plan Note (Signed)
 Continue with core

## 2024-02-29 NOTE — Assessment & Plan Note (Signed)
 Patient with positive orthostasis while working with PT, he became dizzy with systolic blood pressure decreased to 61. -Ordered 500 cc bolus -Monitor blood pressure

## 2024-02-29 NOTE — Anesthesia Postprocedure Evaluation (Signed)
 Anesthesia Post Note  Patient: KAMARON DESKINS  Procedure(s) Performed: PINNING, EXTREMITY, PERCUTANEOUS (Left: Leg Upper)  Patient location during evaluation: PACU Anesthesia Type: General Level of consciousness: awake and alert Pain management: pain level controlled Vital Signs Assessment: post-procedure vital signs reviewed and stable Respiratory status: spontaneous breathing, nonlabored ventilation, respiratory function stable and patient connected to nasal cannula oxygen Cardiovascular status: blood pressure returned to baseline and stable Postop Assessment: no apparent nausea or vomiting Anesthetic complications: no   There were no known notable events for this encounter.   Last Vitals:  Vitals:   02/29/24 0457 02/29/24 0506  BP: (!) 141/61   Pulse: 61 62  Resp: 18   Temp: 36.6 C   SpO2:  95%    Last Pain:  Vitals:   02/29/24 0457  TempSrc: Oral  PainSc:                  Stephanie Coup

## 2024-02-29 NOTE — Assessment & Plan Note (Signed)
 Patient had orthostatic vitals today.  Will -Already received Coreg, Imdur and torsemide which we can continue unless he becomes very symptomatic. -Will repeat orthostatic vitals tomorrow

## 2024-02-29 NOTE — Progress Notes (Signed)
 Initial Nutrition Assessment  DOCUMENTATION CODES:   Non-severe (moderate) malnutrition in context of chronic illness  INTERVENTION:   -Obtain new wt -Ensure Enlive po BID, each supplement provides 350 kcal and 20 grams of protein.  -MVI with minerals daily -Magic cup TID with meals, each supplement provides 290 kcal and 9 grams of protein   NUTRITION DIAGNOSIS:   Moderate Malnutrition related to chronic illness (CHF) as evidenced by mild fat depletion, moderate fat depletion, mild muscle depletion, moderate muscle depletion.  GOAL:   Patient will meet greater than or equal to 90% of their needs  MONITOR:   PO intake, Supplement acceptance  REASON FOR ASSESSMENT:   Consult Assessment of nutrition requirement/status, Hip fracture protocol  ASSESSMENT:   Pt with PMH of hypertension, hyperlipidemia, PVD, CAD with CABG,sCHF, hypothyroidism, depression, BPH, anemia, A-fib on Eliquis, who is admitted by orthopedic team due to left hip fracture.  Pt admitted with closed lt hip fracture.  3/25- s/p Procedure: left hip closed reduction percutaneous pinning   Reviewed I/O's: +661 ml x 24 hours  UOP: 850 ml x 24 hours  Spoke with pt, who was sitting in recliner chair at time of visit. Pt pleasant and in good spirits and expresses appreciation for the care he has received here. Pt reports decreased oral intake over the past 6 months, which was accompanied by weakness. Per pt, he just does not feel like eating anything and admits that he is concerned about his lack of protein intake and low protein levels. Pt consumes 3 meals per day (Breakfast: cereal; Lunch: coffee cake and fruit, Dinner: meat, starch, and vegetable). Food is prepared at home by his wife.   Per pt, he has experienced 10 falls in the past 6 months, which have increased in frequency over the past few months. Pt also reports soft bowel movements that are often hard to pass PTA. He had one bowel movement today without  difficulty.   Pt shares that his UBW is around 185# and has lost about 20# within the past 2 months. Reviewed wt hx; pt has experienced a 4.9% wt loss over the past 6 months, which is not significant for time frame.   Discussed importance of good meal and supplement intake to promote healing.   Medications reviewed and include colace and demadex.   Labs reviewed: Na: 131.    NUTRITION - FOCUSED PHYSICAL EXAM:  Flowsheet Row Most Recent Value  Orbital Region Mild depletion  Upper Arm Region Moderate depletion  Thoracic and Lumbar Region Mild depletion  Buccal Region Mild depletion  Temple Region Moderate depletion  Clavicle Bone Region Mild depletion  Clavicle and Acromion Bone Region Mild depletion  Scapular Bone Region Mild depletion  Dorsal Hand Moderate depletion  Patellar Region Moderate depletion  Anterior Thigh Region Moderate depletion  Posterior Calf Region Moderate depletion  Edema (RD Assessment) Mild  Hair Reviewed  Eyes Reviewed  Mouth Reviewed  Skin Reviewed  Nails Reviewed       Diet Order:   Diet Order             Diet regular Fluid consistency: Thin; Fluid restriction: 1200 mL Fluid  Diet effective now                   EDUCATION NEEDS:   Education needs have been addressed  Skin:  Skin Assessment: Skin Integrity Issues: Skin Integrity Issues:: Other (Comment), Incisions Incisions: closed lt thigh Other: full thickness wounds to bilateral arms r/t trauma  Last  BM:  02/29/24 (type 6)  Height:   Ht Readings from Last 1 Encounters:  02/20/24 6' (1.829 m)    Weight:   Wt Readings from Last 1 Encounters:  02/20/24 79.4 kg    Ideal Body Weight:  80.9 kg  BMI:  There is no height or weight on file to calculate BMI.  Estimated Nutritional Needs:   Kcal:  2200-2400  Protein:  100-115 grams  Fluid:  1.2 L    Levada Schilling, RD, LDN, CDCES Registered Dietitian III Certified Diabetes Care and Education Specialist If unable to reach  this RD, please use "RD Inpatient" group chat on secure chat between hours of 8am-4 pm daily

## 2024-02-29 NOTE — Discharge Instructions (Signed)
 Instructions after Hip Pinning Surgery        Dr. Regenia Skeeter., M.D.      Dept. of Orthopaedics & Sports Medicine  [facility]  [address]  [place], [place]  [address]  Phone: [phone]   Fax: [phone]    DIET: Drink plenty of non-alcoholic fluids. Resume your normal diet. Include foods high in fiber.  ACTIVITY:  You may use crutches or a walker with weight-bearing as tolerated, unless instructed otherwise. You may be weaned off of the walker or crutches by your Physical Therapist.  Continue doing gentle exercises. Exercising will reduce the pain and swelling, increase motion, and prevent muscle weakness.   Please continue to use the TED compression stockings for 2 weeks. You may remove the stockings at night, but should reapply them in the morning. Do not drive or operate any equipment until instructed.  WOUND CARE:  Continue to use ice packs periodically to reduce pain and swelling. You may shower with honeycomb dressing 3 days after your surgery. Do not submerge incision site under water. Remove honeycomb dressing 7 days after surgery and allow dermabond to fall off on its own.   MEDICATIONS: You may resume your regular medications. Please take the pain medication as prescribed on the medication list. Do not take pain medication on an empty stomach. Pain medications and iron supplements can cause constipation. Use a stool softener (Senokot or Colace) on a daily basis and a laxative (dulcolax or miralax) as needed. Do not drive or drink alcoholic beverages when taking pain medications.  POSTOPERATIVE CONSTIPATION PROTOCOL Constipation - defined medically as fewer than three stools per week and severe constipation as less than one stool per week.  One of the most common issues patients have following surgery is constipation.  Even if you have a regular bowel pattern at home, your normal regimen is likely to be disrupted due to multiple reasons  following surgery.  Combination of anesthesia, postoperative narcotics, change in appetite and fluid intake all can affect your bowels.  In order to avoid complications following surgery, here are some recommendations in order to help you during your recovery period.  Colace (docusate) - Pick up an over-the-counter form of Colace or another stool softener and take twice a day as long as you are requiring postoperative pain medications.  Take with a full glass of water daily.  If you experience loose stools or diarrhea, hold the colace until you stool forms back up.  If your symptoms do not get better within 1 week or if they get worse, check with your doctor.  Dulcolax (bisacodyl) - Pick up over-the-counter and take as directed by the product packaging as needed to assist with the movement of your bowels.  Take with a full glass of water.  Use this product as needed if not relieved by Colace only.   MiraLax (polyethylene glycol) - Pick up over-the-counter to have on hand.  MiraLax is a solution that will increase the amount of water in your bowels to assist with bowel movements.  Take as directed and can mix with a glass of water, juice, soda, coffee, or tea.  Take if you go more than two days without a movement. Do not use MiraLax more than once per day. Call your doctor if you are still constipated or irregular after using this medication for 7 days in a row.  If you continue to have problems with postoperative constipation, please contact the office for further assistance and recommendations.  If you experience "the worst abdominal pain ever" or develop nausea or vomiting, please contact the office immediatly for further recommendations for treatment.   CALL THE OFFICE FOR: Temperature above 101 degrees Excessive bleeding or drainage on the dressing. Excessive swelling, coldness, or paleness of the toes. Persistent nausea and vomiting.  FOLLOW-UP:  You should have an appointment to return to  the office in 2 weeks after surgery. Arrangements have been made for continuation of Physical Therapy (either home therapy or outpatient therapy).

## 2024-02-29 NOTE — Assessment & Plan Note (Signed)
 Heart rate seems well-controlled -Continue Coreg and Eliquis

## 2024-02-29 NOTE — Progress Notes (Signed)
   Subjective: 1 Day Post-Op Procedure(s) (LRB): PINNING, EXTREMITY, PERCUTANEOUS (Left) Patient reports pain as mild.   Patient is well but complaining of urinary retention. In and out performed once Denies any CP, SOB, ABD pain. We will continue therapy today.  Plan is to go Home after hospital stay.  Objective: Vital signs in last 24 hours: Temp:  [96.8 F (36 C)-98.2 F (36.8 C)] 97.5 F (36.4 C) (03/26 0749) Pulse Rate:  [52-68] 61 (03/26 0749) Resp:  [12-21] 16 (03/26 0749) BP: (95-166)/(43-69) 166/68 (03/26 0749) SpO2:  [91 %-98 %] 98 % (03/26 0749)  Intake/Output from previous day: 03/25 0701 - 03/26 0700 In: 1510.8 [P.O.:457; I.V.:853.8; IV Piggyback:200] Out: 850 [Urine:850] Intake/Output this shift: No intake/output data recorded.  Recent Labs    02/28/24 1910 02/29/24 0854  HGB 13.7 13.0   Recent Labs    02/28/24 1910 02/29/24 0854  WBC 7.2 11.8*  RBC 4.29 4.06*  HCT 39.1 36.3*  PLT 215 245   Recent Labs    02/28/24 1910 02/29/24 0352  NA 129* 131*  K 4.9 4.0  CL 99 102  CO2 23 21*  BUN 17 17  CREATININE 0.93 0.89  GLUCOSE 151* 101*  CALCIUM 8.3* 8.3*   No results for input(s): "LABPT", "INR" in the last 72 hours.  EXAM General - Patient is Alert, Appropriate, and Oriented Extremity - Neurovascular intact Sensation intact distally Intact pulses distally Dorsiflexion/Plantar flexion intact Dressing - dressing C/D/I Motor Function - intact, moving foot and toes well on exam.   Past Medical History:  Diagnosis Date   Carotid artery narrowing    History of blood clots    History of colon polyps    Hyperlipidemia    Hypertension    MI (myocardial infarction) (HCC)     Assessment/Plan:   1 Day Post-Op Procedure(s) (LRB): PINNING, EXTREMITY, PERCUTANEOUS (Left) Principal Problem:   Fracture of femoral neck, left, closed (HCC) Active Problems:   BPH with obstruction/lower urinary tract symptoms   Essential (primary)  hypertension   Dyslipidemia, goal LDL below 70   Atrial fibrillation, persistent (HCC)   Smoker   CAD (coronary artery disease)   Chronic combined systolic and diastolic CHF (congestive heart failure) (HCC)   Depression   Hyponatremia  Estimated body mass index is 23.73 kg/m as calculated from the following:   Height as of 02/20/24: 6' (1.829 m).   Weight as of 02/20/24: 79.4 kg. Advance diet Up with therapy Pain controlled VSS Labs stable Urinary retention - in and out performed, awaiting void trial Appreciate hospitalist input CM to assist with discharge to home with HHPT today  DVT Prophylaxis - TED hose and SCDs Eliquis Weight-Bearing as tolerated to left leg   T. Cranston Neighbor, PA-C [facility] 02/29/2024, 9:10 AM

## 2024-02-29 NOTE — Assessment & Plan Note (Signed)
 Nicotine patch

## 2024-02-29 NOTE — Assessment & Plan Note (Signed)
 No chest pain. -Continue with Lipitor, Imdur, Ranexa and as needed nitroglycerin

## 2024-02-29 NOTE — Consult Note (Signed)
 WOC Nurse Consult Note: patient with multiple skin tears that wife has been placing triple antibiotic ointment and non stick dressings and Kerlix on every other day; this WOC RN did speak to wife over the phone and she is agreeable to Vaseline gauze every other dry  Reason for Consult: skin tears  Wound type: full thickness wounds to B arms r/t trauma  Pressure Injury POA: NA  Measurement: see nursing flowsheet  Wound bed: L hand 100% red dry, L posterior forearm 3 areas 100% black eschar, L elbow area 100% pink dry; R forearm 70% pink 30% yellow  Drainage (amount, consistency, odor) see nursing flowsheet  Periwound: scattered ecchymosis noted  Dressing procedure/placement/frequency: Cleanse skin tears with NS, apply Vaseline gauze [facility] [place] #[id number]) to wound beds every other day, cover with Telfa nonstick dressing and secure with Kerlix roll gauze. May apply Ace bandage over Kerlix if desired.   POC discussed with wife and bedside nurse. WOC team will not follow. Re-consult if further needs arise.   Thank you,     Priscella Mann MSN, RN-BC, [employer] [phone]

## 2024-02-29 NOTE — Assessment & Plan Note (Signed)
 2d echo on 04/13/2022 showed EF of 45-50% with grade 1 diastolic dysfunction.  Patient does not have leg edema, no SOB, however BNP is elevated at 579.  Patient is at risk of developing CHF exacerbation.  Received 1 dose of IV Lasix yesterday -Continue with home torsemide

## 2024-02-29 NOTE — Assessment & Plan Note (Signed)
 Continue home meds

## 2024-02-29 NOTE — Assessment & Plan Note (Signed)
 Sodium is of improvement to 131. -Continue with fluid restriction -Continue with salt tablet

## 2024-02-29 NOTE — Assessment & Plan Note (Signed)
 Patient did develop some urinary retention overnight requiring In-N-Out catheter x 1. -Encouraged voiding and check periodic bladder scan -Continue with home finasteride

## 2024-02-29 NOTE — Evaluation (Signed)
 Physical Therapy Evaluation Patient Details Name: Gregory Shepherd MRN: [id number] DOB: [birth year] Today's Date: 02/29/2024  History of Present Illness  Pt is a 78 y.o. male s/p L hip closed reduction percutaneous pinning on 3/25. Pt presented to ED after 4 falls on 3/16 resulting in L femoral neck fx/multiple abrasions to elbows, but left before results given. PMH significant for PMH of CAD/CM/CABG/EF 35%, A. Fib, cardioversion on 02/17/24, DM 2, HTN, PVD, L carpal tunnel, L ulnar neuropathy, HLD, depression, anemia   Clinical Impression  Pt receiving IV from nurse upon arrival.  Pt eager to get up and attempt ambulation.  Pt with noticeable weakness in the L UE and has even more diminished grip in the L UE that prevents him from being able to adequately utilize the L UE when performing transfers.  Pt required modA to stand from recliner due to height of pt and it being lowered surface.  Pt then utilized the RW to ambulate in the room to the other side of the bed, noting increased dizziness the longer he stood/walked.  Pt sat at EOB and required minA to assist in getting LE's back into the bed.  Pt then left with all needs met and call bell within reach.  Therapist spent adequate time addressing family concerns about placement and for discharge plans following hospital stay.       If plan is discharge home, recommend the following: A lot of help with bathing/dressing/bathroom;A lot of help with walking and/or transfers;Assist for transportation;Help with stairs or ramp for entrance   Can travel by private vehicle   Yes    Equipment Recommendations Other (comment) (TBD at next venue of care.)  Recommendations for Other Services       Functional Status Assessment Patient has had a recent decline in their functional status and demonstrates the ability to make significant improvements in function in a reasonable and predictable amount of time.     Precautions / Restrictions  Precautions Precautions: Fall Restrictions Weight Bearing Restrictions Per Provider Order: Yes LLE Weight Bearing Per Provider Order: Weight bearing as tolerated      Mobility  Bed Mobility Overal bed mobility: Needs Assistance Bed Mobility: Supine to Sit, Sit to Supine     Supine to sit: Supervision Sit to supine: Min assist        Transfers Overall transfer level: Needs assistance Equipment used: Rolling walker (2 wheels) Transfers: Sit to/from Stand Sit to Stand: Mod assist           General transfer comment: modA from recliner, minA to lower slowly to the bed    Ambulation/Gait Ambulation/Gait assistance: Contact guard assist Gait Distance (Feet): 12 Feet Assistive device: Rolling walker (2 wheels) Gait Pattern/deviations: Step-through pattern Gait velocity: slowed gait pattern.     General Gait Details: Pt able to safely maneuver in the room, however became dizzy and requested to sit at the EOB on the other side  Stairs            Wheelchair Mobility     Tilt Bed    Modified Rankin (Stroke Patients Only)       Balance Overall balance assessment: History of Falls, Needs assistance Sitting-balance support: No upper extremity supported, Feet supported Sitting balance-Leahy Scale: Good Sitting balance - Comments: manages urinal seated with SBA   Standing balance support: Bilateral upper extremity supported, During functional activity, Reliant on assistive device for balance Standing balance-Leahy Scale: Fair Standing balance comment: minA - CGA for static standing  Pertinent Vitals/Pain      Home Living Family/patient expects to be discharged to:: Private residence Living Arrangements: Spouse/significant other (lives with spouse who cannot provide physical assist. son is staying from out of town) Available Help at Discharge: Available PRN/intermittently;Family Type of Home: House Home Access: Stairs  to enter   [employer] of Steps: 2 (back of house, son takes pt up in wc), 8 in garage   Home Layout: Able to live on main level with bedroom/bathroom;Two level Home Equipment: Rollator (4 wheels);Shower seat;Wheelchair - manual;Grab bars - toilet Additional Comments: wc is wide; spouse is trying to get him new wc    Prior Function Prior Level of Function : History of Falls (last six months);Needs assist       Physical Assist : ADLs (physical);Mobility (physical) Mobility (physical): Gait;Stairs;Transfers;Bed mobility (assist from spouse or son) ADLs (physical): IADLs;Bathing;Dressing;Toileting Mobility Comments: frequent falls, pt reports balance and BLE weakness (says he falls doing "silly things" like picking up items from floor and cannot get up without assist). uses 4WW household distances, wc for MD appt transportation and community (son takes pt up stairs in wc) ADLs Comments: spouse helps with dressing, bathing and toileting     Extremity/Trunk Assessment   Upper Extremity Assessment Upper Extremity Assessment: Right hand dominant;LUE deficits/detail;Generalized weakness LUE Deficits / Details: hx of L carpal tunnel/L hand weakness, decreased FMC and GMC noted with poor bilateral coordination. Can assist minimally with placement on RW    Lower Extremity Assessment Lower Extremity Assessment: Defer to PT evaluation    Cervical / Trunk Assessment Cervical / Trunk Assessment: Other exceptions  Communication   Communication Communication: No apparent difficulties    Cognition Arousal: Alert Behavior During Therapy: WFL for tasks assessed/performed                                     Cueing       General Comments General comments (skin integrity, edema, etc.): TED hose on, pt noted to be orthostatic with BP assessment, symptomatic. See flowsheet. Alerted MD and RN.    Exercises     Assessment/Plan    PT Assessment Patient needs  continued PT services  PT Problem List Decreased strength;Decreased activity tolerance;Decreased balance;Decreased mobility;Decreased knowledge of use of DME;Decreased safety awareness;Pain       PT Treatment Interventions DME instruction;Gait training;Stair training;Functional mobility training;Therapeutic activities;Therapeutic exercise;Balance training;Neuromuscular re-education    PT Goals (Current goals can be found in the Care Plan section)  Acute Rehab PT Goals Patient Stated Goal: To get stronger and eventually return home. PT Goal Formulation: With patient/family Time For Goal Achievement: 03/14/24 Potential to Achieve Goals: Fair    Frequency 7X/week     Co-evaluation               AM-PAC PT "6 Clicks" Mobility  Outcome Measure Help needed turning from your back to your side while in a flat bed without using bedrails?: A Little Help needed moving from lying on your back to sitting on the side of a flat bed without using bedrails?: A Little Help needed moving to and from a bed to a chair (including a wheelchair)?: A Little Help needed standing up from a chair using your arms (e.g., wheelchair or bedside chair)?: A Little Help needed to walk in hospital room?: A Lot Help needed climbing 3-5 steps with a railing? : Total 6 Click Score: 15  End of Session Equipment Utilized During Treatment: Gait belt Activity Tolerance: Patient tolerated treatment well Patient left: in bed;with call bell/phone within reach;with bed alarm set;with family/visitor present Nurse Communication: Mobility status PT Visit Diagnosis: Unsteadiness on feet (R26.81);Other abnormalities of gait and mobility (R26.89);Muscle weakness (generalized) (M62.81);History of falling (Z91.81);Dizziness and giddiness (R42)    Time: 6578-4696 PT Time Calculation (min) (ACUTE ONLY): 35 min   Charges:   PT Evaluation $PT Eval Moderate Complexity: 1 Mod PT Treatments $Therapeutic Activity: 23-37  mins PT General Charges $$ ACUTE PT VISIT: 1 Visit         Nolon Bussing, PT, DPT Physical Therapist - [facility]  02/29/24, 1:38 PM

## 2024-02-29 NOTE — Assessment & Plan Note (Signed)
 S/p ORIF.  Pain seems well-controlled. PT and OT are recommending SNF -TOC to find placement -Continue with pain control and supportive care

## 2024-02-29 NOTE — Plan of Care (Signed)

## 2024-02-29 NOTE — Evaluation (Signed)
 Occupational Therapy Evaluation Patient Details Name: Gregory Shepherd "Gregory Shepherd" MRN: [id number] DOB: Jan 18, 1946 Today's Date: 02/29/2024   History of Present Illness   Pt is a 78 y.o. male s/p L hip closed reduction percutaneous pinning on 3/25. Pt presented to ED after 4 falls on 3/16 resulting in L femoral neck fx/multiple abrasions to elbows, but left before results given. PMH significant for PMH of CAD/CM/CABG/EF 35%, A. Fib, cardioversion on 02/17/24, DM 2, HTN, PVD, L carpal tunnel, L ulnar neuropathy, HLD, depression, anemia     Clinical Impressions Pt seen for OT eval this date; NT in room for bladder scan. PTA, pt reports frequent falls hx, lives with spouse in a multi-level home, and requires assist for ADLs using 4WW household distances. Pt can feed himself and perform grooming tasks, but requires assist for showering and dressing. Son helps with transportation to MD appointments, and uses a manual wc for community.   Pt requires min A for bed mobility and functional transfers using RW this date. Reports dizziness during standing attempts (see flowsheet & below for BP assessment), noted to be positive for orthostatic hypotension. Anticipate pt will require up to max A for LB ADLs. Pt would benefit from skilled OT services to address noted impairments and functional limitations (see below for any additional details) in order to maximize safety and independence while minimizing falls risk and caregiver burden. Anticipate the need for follow up OT services upon acute hospital DC.    Orthostatic vital assessment: Supine 118/57 (72) HR 63 Sitting 90/76 (83) HR 63 Standing 61/53 (57) Supine in recliner 125/55 (75), HR 61 End of session 111/58 (74), HR 62, O2 95% on RA    If plan is discharge home, recommend the following:   A lot of help with walking and/or transfers;A lot of help with bathing/dressing/bathroom;Assistance with cooking/housework;Direct supervision/assist for medications  management;Direct supervision/assist for financial management;Assist for transportation;Help with stairs or ramp for entrance     Functional Status Assessment   Patient has had a recent decline in their functional status and demonstrates the ability to make significant improvements in function in a reasonable and predictable amount of time.     Equipment Recommendations   BSC/3in1      Precautions/Restrictions   Precautions Precautions: Fall Restrictions Weight Bearing Restrictions Per Provider Order: Yes LLE Weight Bearing Per Provider Order: Weight bearing as tolerated     Mobility Bed Mobility Overal bed mobility: Needs Assistance Bed Mobility: Supine to Sit     Supine to sit: Min assist     General bed mobility comments: requires minA to perform bed mobility (improved from earlier, pt was +2 per nursing)    Transfers Overall transfer level: Needs assistance Equipment used: Rolling walker (2 wheels) Transfers: Sit to/from Stand, Bed to chair/wheelchair/BSC Sit to Stand: Min assist     Step pivot transfers: Min assist     General transfer comment: requires min A to stand from elevated bed height, able to tolerate WB through surgical leg. cues for hand placement and sequencing      Balance Overall balance assessment: History of Falls, Needs assistance Sitting-balance support: No upper extremity supported, Feet supported Sitting balance-Leahy Scale: Good Sitting balance - Comments: manages urinal seated with SBA   Standing balance support: Bilateral upper extremity supported, During functional activity, Reliant on assistive device for balance Standing balance-Leahy Scale: Fair Standing balance comment: minA - CGA for static standing  ADL either performed or assessed with clinical judgement   ADL Overall ADL's : Needs assistance/impaired                         Toilet Transfer: Minimal assistance;Rolling  walker (2 wheels);BSC/3in1 Toilet Transfer Details (indicate cue type and reason): simulated to recliner Toileting- Clothing Manipulation and Hygiene: Maximal assistance;Sit to/from stand Toileting - Clothing Manipulation Details (indicate cue type and reason): able to use urinal seated with supervision, anticipate max A for pericare in standing     Functional mobility during ADLs: Minimal assistance;Rolling walker (2 wheels)       Vision Baseline Vision/History: 1 Wears glasses Ability to See in Adequate Light: 0 Adequate Patient Visual Report: No change from baseline Vision Assessment?: Wears glasses for reading;Wears glasses for driving            Pertinent Vitals/Pain Pain Assessment Pain Assessment: 0-10 Pain Score: 8  Pain Location: bladder Pain Descriptors / Indicators: Discomfort, Dull Pain Intervention(s): Limited activity within patient's tolerance, Monitored during session, Premedicated before session, Repositioned     Extremity/Trunk Assessment Upper Extremity Assessment Upper Extremity Assessment: Right hand dominant;LUE deficits/detail;Generalized weakness LUE Deficits / Details: hx of L carpal tunnel/L hand weakness, decreased FMC and GMC noted with poor bilateral coordination. Can assist minimally with placement on RW   Lower Extremity Assessment Lower Extremity Assessment: Defer to PT evaluation   Cervical / Trunk Assessment Cervical / Trunk Assessment: Other exceptions   Communication Communication Communication: No apparent difficulties   Cognition Arousal: Alert Behavior During Therapy: WFL for tasks assessed/performed Cognition: No family/caregiver present to determine baseline                               Following commands: Intact       Cueing  General Comments   Cueing Techniques: Verbal cues;Tactile cues  TED hose on, pt noted to be orthostatic with BP assessment, symptomatic. See flowsheet. Alerted MD and RN.            Home Living Family/patient expects to be discharged to:: Private residence Living Arrangements: Spouse/significant other (lives with spouse who cannot provide physical assist. son is staying from out of town) Available Help at Discharge: Available PRN/intermittently;Family Type of Home: House Home Access: Stairs to enter [employer] of Steps: 2 (back of house, son takes pt up in wc), 8 in garage   Home Layout: Able to live on main level with bedroom/bathroom;Two level     Bathroom Shower/Tub: Producer, television/film/video: Handicapped height Bathroom Accessibility: Yes   Home Equipment: Rollator (4 wheels);Shower seat;Wheelchair - manual;Grab bars - toilet   Additional Comments: wc is wide; spouse is trying to get him new wc      Prior Functioning/Environment Prior Level of Function : History of Falls (last six months);Needs assist       Physical Assist : ADLs (physical);Mobility (physical) Mobility (physical): Gait;Stairs;Transfers;Bed mobility (assist from spouse or son) ADLs (physical): IADLs;Bathing;Dressing;Toileting Mobility Comments: frequent falls, pt reports balance and BLE weakness (says he falls doing "silly things" like picking up items from floor and cannot get up without assist). uses [employer] household distances, wc for MD appt transportation and community (son takes pt up stairs in wc) ADLs Comments: spouse helps with dressing, bathing and toileting    OT Problem List: Decreased strength;Decreased range of motion;Decreased activity tolerance;Impaired balance (sitting and/or standing);Decreased safety awareness;Decreased knowledge of use  of DME or AE;Decreased knowledge of precautions;Impaired UE functional use   OT Treatment/Interventions: Self-care/ADL training;Neuromuscular education;DME and/or AE instruction;Therapeutic activities;Patient/family education;Balance training      OT Goals(Current goals can be found in the care plan section)    Acute Rehab OT Goals OT Goal Formulation: With patient Time For Goal Achievement: 03/14/24 Potential to Achieve Goals: Good   OT Frequency:  Min 2X/week       AM-PAC OT "6 Clicks" Daily Activity     Outcome Measure Help from another person eating meals?: None Help from another person taking care of personal grooming?: None Help from another person toileting, which includes using toliet, bedpan, or urinal?: A Lot Help from another person bathing (including washing, rinsing, drying)?: A Lot Help from another person to put on and taking off regular upper body clothing?: A Little Help from another person to put on and taking off regular lower body clothing?: A Lot 6 Click Score: 17   End of Session Equipment Utilized During Treatment: Gait belt;Rolling walker (2 wheels) Nurse Communication: Mobility status;Other (comment) (orthostaic, pain levels, noted wound drainage from arms, small amount of bloody urine in standing)  Activity Tolerance: Patient tolerated treatment well Patient left: in chair;with call bell/phone within reach;with chair alarm set  OT Visit Diagnosis: Muscle weakness (generalized) (M62.81);History of falling (Z91.81);Repeated falls (R29.6);Other abnormalities of gait and mobility (R26.89)                Time: 0903-1001 OT Time Calculation (min): 58 min Charges:  OT General Charges $OT Visit: 1 Visit OT Evaluation $OT Eval Moderate Complexity: 1 Mod OT Treatments $Self Care/Home Management : 23-37 mins  Paraskevi Funez L. Nailah Luepke, OTR/L  02/29/24, 1:14 PM

## 2024-03-01 DIAGNOSIS — S72002A Fracture of unspecified part of neck of left femur, initial encounter for closed fracture: Secondary | ICD-10-CM | POA: Diagnosis not present

## 2024-03-01 DIAGNOSIS — E440 Moderate protein-calorie malnutrition: Secondary | ICD-10-CM | POA: Insufficient documentation

## 2024-03-01 LAB — BASIC METABOLIC PANEL WITH GFR
Anion gap: 6 (ref 5–15)
BUN: 18 mg/dL (ref 8–23)
CO2: 25 mmol/L (ref 22–32)
Calcium: 7.9 mg/dL — ABNORMAL LOW (ref 8.9–10.3)
Chloride: 101 mmol/L (ref 98–111)
Creatinine, Ser: 0.94 mg/dL (ref 0.61–1.24)
GFR, Estimated: >60 mL/min (ref >60)
Glucose, Bld: 90 mg/dL (ref 70–99)
Potassium: 3.5 mmol/L (ref 3.5–5.1)
Sodium: 132 mmol/L — ABNORMAL LOW (ref 135–145)

## 2024-03-01 MED ORDER — HYDROCODONE-ACETAMINOPHEN 5-325 MG PO TABS: 1.0000 | ORAL_TABLET | Freq: Four times a day (QID) | ORAL | 0 refills | Status: DC | PRN

## 2024-03-01 MED ORDER — TAMSULOSIN HCL 0.4 MG PO CAPS
0.4000 mg | ORAL_CAPSULE | Freq: Every day | ORAL | Status: DC
  Administered 2024-03-02: 0.4 mg via ORAL
  Filled 2024-03-01: qty 1

## 2024-03-01 MED ORDER — CHLORHEXIDINE GLUCONATE CLOTH 2 % EX PADS
6.0000 | MEDICATED_PAD | Freq: Every day | CUTANEOUS | Status: DC
  Administered 2024-03-01 – 2024-03-02 (×2): 6 via TOPICAL

## 2024-03-01 NOTE — Plan of Care (Signed)
   Problem: Education: Goal: Knowledge of General Education information will improve Description: Including pain rating scale, medication(s)/side effects and non-pharmacologic comfort measures Outcome: Progressing   Problem: Activity: Goal: Risk for activity intolerance will decrease Outcome: Progressing   Problem: Nutrition: Goal: Adequate nutrition will be maintained Outcome: Progressing

## 2024-03-01 NOTE — Progress Notes (Signed)
  Hospitalist consult PROGRESS NOTE    Gregory Shepherd  HYQ:[id number] DOB: 1946/02/09 DOA: 02/28/2024 PCP: Lauro Regulus, MD  131A/131A-AA  LOS: 2 days   Brief hospital course:   Assessment & Plan: Gregory Shepherd is an 78 y.o. male with PMH of hypertension, hyperlipidemia, PVD, CAD with CABG,sCHF, hypothyroidism, depression, BPH, anemia, A-fib on Eliquis, who is admitted by orthopedic team due to left hip fracture due to mechanical fall 3 days ago. Pt is s/p of surgery . We are asked to consult for management of chronic medical issues.   * Fracture of femoral neck, left, closed (HCC) S/p ORIF.  Pain seems well-controlled. PT and OT are recommending SNF   Orthostatic hypotension, improved Patient with positive orthostasis while working with PT, he became dizzy with systolic blood pressure decreased to 61. --repeat orthostatic BP improved today.   CAD (coronary artery disease) No chest pain. --cont statin   Chronic combined systolic and diastolic CHF (congestive heart failure) (HCC) 2d echo on 04/13/2022 showed EF of 45-50% with grade 1 diastolic dysfunction.  Patient does not have leg edema, no SOB. --cont torsemide   Atrial fibrillation, persistent (HCC) Heart rate seems well-controlled -Continue Coreg and Eliquis   Essential (primary) hypertension --BP elevated while lying down --cont coreg, Imdur and torsemide   Hyponatremia Sodium is of improvement to 131. --cont fluid restriction 1200 ml -Continue with salt tablet   Dyslipidemia, goal LDL below 70 -Continue with Lipitor   BPH with obstruction/lower urinary tract symptoms Acute urinary retention --Foley inserted on 3/26 -Continue with home finasteride --add flomax   Smoker -Nicotine patch   Depression -Continue home meds    DVT prophylaxis: XB:[id number] Code Status: Full code  Family Communication: family updated at bedside today Level of care: Med-Surg Dispo:   The patient is from:  home Anticipated d/c is to: SNF rehab Anticipated d/c date is: per primary team   Subjective and Interval History:  Orthostatic BP measured today, BP still dropped upon standing, but stayed >100, and pt tolerated being up.   Objective: Vitals:   03/01/24 0741 03/01/24 1244 03/01/24 1443 03/01/24 1937  BP: (!) 148/52 (!) 117/44  (!) 137/44  Pulse: (!) 54 (!) 53  (!) 55  Resp: 18 18  16   Temp: 97.6 F (36.4 C)   98 F (36.7 C)  TempSrc: Oral     SpO2: 98% 100%  97%  Weight:   76.2 kg   Height:   6' (1.829 m)     Intake/Output Summary (Last 24 hours) at 03/01/2024 2052 Last data filed at 03/01/2024 2032 Gross per 24 hour  Intake 210 ml  Output 1550 ml  Net -1340 ml   Filed Weights   03/01/24 1443  Weight: 76.2 kg    Examination:   Constitutional: NAD, AAOx3 HEENT: conjunctivae and lids normal, EOMI CV: No cyanosis.   RESP: normal respiratory effort, on RA Neuro: II - XII grossly intact.     Data Reviewed: I have personally reviewed labs and imaging studies  Time spent: 50 minutes  Darlin Priestly, MD [facility] If 7PM-7AM, please contact night-coverage 03/01/2024, 8:52 PM

## 2024-03-01 NOTE — TOC PASRR Note (Signed)
 RE: Gregory Shepherd Date of Birth: 09/11/1946 Date: 03/01/2024   To Whom It May Concern:  Please be advised that the above-named patient will require a short-term nursing home stay - anticipated 30 days or less for rehabilitation and strengthening.  The plan is for return home.

## 2024-03-01 NOTE — [place] FL2 (Signed)
 [place] MEDICAID FL2 LEVEL OF CARE FORM     IDENTIFICATION  Patient Name: Gregory Shepherd Birthdate: 1946-03-07 Sex: male Admission Date (Current Location): 02/28/2024  [facility] and [place] Number:  Chiropodist and Address:  [facility], [address], [place], [place] [address]      Provider Number: [id number]  Attending Physician Name and Address:  Reinaldo Berber, MD  Relative Name and Phone Number:       Current Level of Care: Hospital Recommended Level of Care: Skilled Nursing Facility Prior Approval Number:    Date Approved/Denied:   PASRR Number: Manual review  Discharge Plan: SNF    Current Diagnoses: Patient Active Problem List   Diagnosis Date Noted   Orthostatic hypotension 02/29/2024   Fracture of femoral neck, left, closed (HCC) 02/28/2024   CAD (coronary artery disease) 02/28/2024   Chronic combined systolic and diastolic CHF (congestive heart failure) (HCC) 02/28/2024   Depression 02/28/2024   Hyponatremia 02/28/2024   Hypothyroidism 12/20/2022   Degenerative disc disease, cervical 10/12/2022   Smoker 08/24/2022   Left carpal tunnel syndrome 07/14/2022   Ulnar neuropathy at elbow, left 07/14/2022   Pronation deformity of ankle, acquired 05/11/2022   Left hand weakness 02/12/2022   Gastritis and gastroduodenitis    Melena    Microcytic anemia    Atrial fibrillation, persistent (HCC)    Shortness of breath 06/19/2021   Hyperlipemia 06/19/2021   Chronic GERD 06/19/2021   Bilateral lower extremity edema 01/14/2021   Claudication in peripheral vascular disease (HCC) 06/04/2019   Dyslipidemia, goal LDL below 70 02/20/2019   Peripheral arterial disease (HCC) 02/03/2019   Diabetes (HCC) 01/04/2018   Nocturia 01/04/2018   Right leg pain 12/13/2017   Health care maintenance 07/17/2015   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   Essential (primary) hypertension  06/30/2014   Polyp of colon 06/30/2014   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   Incomplete emptying of bladder 08/10/2013   Nodular prostate with urinary obstruction 08/10/2013   Prostate nodule 08/10/2013    Orientation RESPIRATION BLADDER Height & Weight     Self, Time, Situation, Place  Normal Incontinent, Indwelling catheter Weight:   Height:     BEHAVIORAL SYMPTOMS/MOOD NEUROLOGICAL BOWEL NUTRITION STATUS   (None)  (None) Incontinent Diet (Regular. Fluid restriction: 1200 mL.)  AMBULATORY STATUS COMMUNICATION OF NEEDS Skin   Limited Assist Verbally Skin abrasions, Bruising, Surgical wounds, Other (Comment) (Incison on left thigh: Honeycomb. Skin tear on left arm: gauze, impregnated gauze (petrolatum), non-adherent every other day. Skin tear on left hand and right arm: gauze, impregnated gauze (petrolatum), non-adherent.)                       Personal Care Assistance Level of Assistance  Bathing, Feeding, Dressing Bathing Assistance: Maximum assistance Feeding assistance: Limited assistance Dressing Assistance: Maximum assistance     Functional Limitations Info  Sight, Hearing, Speech Sight Info: Adequate Hearing Info: Adequate Speech Info: Adequate    SPECIAL CARE FACTORS FREQUENCY  PT (By licensed PT), OT (By licensed OT)     PT Frequency: 5 x week OT Frequency: 5 x week            Contractures Contractures Info: Not present    Additional Factors Info  Code Status, Allergies Code Status Info: Full code Allergies Info: Omeprazole, Other (Certain steroids give patient heart palpitations. Allergy to green peppers causes upset  stomach).           Current Medications (03/01/2024):  This is the current hospital active medication list Current Facility-Administered Medications  Medication Dose Route Frequency Provider Last Rate Last Admin   acetaminophen (TYLENOL) tablet 650 mg  650 mg Oral Q6H PRN Lorretta Harp, MD        amiodarone (PACERONE) tablet 200 mg  200 mg Oral Q supper Angelique Blonder, RPH   200 mg at 02/29/24 1807   apixaban (ELIQUIS) tablet 5 mg  5 mg Oral BID Reinaldo Berber, MD   5 mg at 03/01/24 0931   atorvastatin (LIPITOR) tablet 80 mg  80 mg Oral Q supper Otelia Sergeant, RPH   80 mg at 02/29/24 1807   carvedilol (COREG) tablet 3.125 mg  3.125 mg Oral BID Angelique Blonder, RPH   3.125 mg at 02/29/24 2132   Chlorhexidine Gluconate Cloth 2 % PADS 6 each  6 each Topical Daily Reinaldo Berber, MD       docusate sodium (COLACE) capsule 100 mg  100 mg Oral BID Reinaldo Berber, MD   100 mg at 02/29/24 2133   DULoxetine (CYMBALTA) DR capsule 20 mg  20 mg Oral Q supper Otelia Sergeant, RPH   20 mg at 02/29/24 1807   feeding supplement (ENSURE ENLIVE / ENSURE PLUS) liquid 237 mL  237 mL Oral BID BM Reinaldo Berber, MD   237 mL at 03/01/24 0939   finasteride (PROSCAR) tablet 5 mg  5 mg Oral QHS Reinaldo Berber, MD   5 mg at 02/29/24 2132   gabapentin (NEURONTIN) capsule 300 mg  300 mg Oral BID Angelique Blonder, RPH   300 mg at 02/29/24 2133   hydrALAZINE (APRESOLINE) injection 5 mg  5 mg Intravenous Q2H PRN Lorretta Harp, MD   5 mg at 02/29/24 4098   HYDROcodone-acetaminophen (NORCO/VICODIN) 5-325 MG per tablet 1-2 tablet  1-2 tablet Oral Q6H PRN Reinaldo Berber, MD       isosorbide mononitrate (IMDUR) 24 hr tablet 90 mg  90 mg Oral Q2000 Angelique Blonder, RPH   90 mg at 02/29/24 2132   magnesium hydroxide (MILK OF MAGNESIA) suspension 30 mL  30 mL Oral Daily PRN Reinaldo Berber, MD   30 mL at 02/29/24 1191   methocarbamol (ROBAXIN) tablet 500 mg  500 mg Oral Q8H PRN Lorretta Harp, MD   500 mg at 02/29/24 0630   morphine (PF) 2 MG/ML injection 0.5 mg  0.5 mg Intravenous Q2H PRN Reinaldo Berber, MD       multivitamin with minerals tablet 1 tablet  1 tablet Oral Daily Reinaldo Berber, MD   1 tablet at 03/01/24 0931   nicotine (NICODERM CQ - dosed in mg/24 hours) patch 21 mg  21 mg Transdermal Daily  Lorretta Harp, MD   21 mg at 03/01/24 0931   nitroGLYCERIN (NITROSTAT) SL tablet 0.4 mg  0.4 mg Sublingual Q5 min PRN Evon Slack, PA-C   0.4 mg at 02/28/24 1808   ondansetron (ZOFRAN) injection 4 mg  4 mg Intravenous Q8H PRN Lorretta Harp, MD       ranolazine (RANEXA) 12 hr tablet 500 mg  500 mg Oral BID Bari Mantis A, RPH   500 mg at 02/29/24 2132   sodium chloride tablet 1 g  1 g Oral BID WC Lorretta Harp, MD   1 g at 03/01/24 0931   torsemide (DEMADEX) tablet 20 mg  20 mg Oral Daily Lorretta Harp, MD   20  mg at 03/01/24 0931   traZODone (DESYREL) tablet 50 mg  50 mg Oral QHS Reinaldo Berber, MD   50 mg at 02/29/24 2132     Discharge Medications: Please see discharge summary for a list of discharge medications.  Relevant Imaging Results:  Relevant Lab Results:   Additional Information SS#: [id number]  Margarito Liner, LCSW

## 2024-03-01 NOTE — TOC Initial Note (Addendum)
 Transition of Care [facility]) - Initial/Assessment Note    Patient Details  Name: Gregory Shepherd MRN: [id number] Date of Birth: 04-28-1946  Transition of Care [facility]) CM/SW Contact:    Margarito Liner, LCSW Phone Number: 03/01/2024, 10:52 AM  Clinical Narrative:  CSW met with patient. Wife at bedside. CSW introduced role and explained that therapy recommendations would be discussed. Patient and wife are agreeable to SNF placement. Only preferences are [address], [address], and [facility]. CSW left message for [facility] admissions coordinator asking her to review. PASARR under manual review. No further concerns. CSW will continue to follow patient and his wife for support and facilitate discharge to SNF once medically stable.      11:53 am: [facility] is not in network with patient's insurance. CSW left message for [facility] admissions coordinator asking them to review. CSW asked MD to cosign FL2 and 30-day note for PASARR.            1:03 pm: Uploaded clinicals into [facility] for PASARR review.  2:23 pm: [facility] confirmed they can offer patient a bed and will have availability tomorrow if insurance authorization is obtained. Wife is aware and agreeable. PASARR obtained: [id number]. Expires 03/31/24. Wife can transport as long as she gets assistance getting him in and out of the car.    Barriers to Discharge: Continued Medical Work up   Patient Goals and CMS Choice            Expected Discharge Plan and Services     Post Acute Care Choice: Skilled Nursing Facility Living arrangements for the past 2 months: Single Family Home                                      Prior Living Arrangements/Services Living arrangements for the past 2 months: Single Family Home Lives with:: Spouse Patient language and need for interpreter reviewed:: Yes Do you feel safe going back to the place where you live?: Yes      Need for Family Participation in Patient Care:  Yes (Comment) Care giver support system in place?: Yes (comment)   Criminal Activity/Legal Involvement Pertinent to Current Situation/Hospitalization: No - Comment as needed  Activities of Daily Living      Permission Sought/Granted Permission sought to share information with : Facility Medical sales representative, Family Supports Permission granted to share information with : Yes, Verbal Permission Granted  Share Information with NAME: Nafis Farnan  Permission granted to share info w AGENCY: SNF's  Permission granted to share info w Relationship: Wife  Permission granted to share info w Contact Information: [phone]  Emotional Assessment Appearance:: Appears stated age Attitude/Demeanor/Rapport: Engaged, Gracious Affect (typically observed): Accepting, Appropriate, Calm, Pleasant Orientation: : Oriented to Self, Oriented to Place, Oriented to  Time, Oriented to Situation Alcohol / Substance Use: Not Applicable Psych Involvement: No (comment)  Admission diagnosis:  Fracture of femoral neck, left, closed (HCC) [S72.002A] Patient Active Problem List   Diagnosis Date Noted   Orthostatic hypotension 02/29/2024   Fracture of femoral neck, left, closed (HCC) 02/28/2024   CAD (coronary artery disease) 02/28/2024   Chronic combined systolic and diastolic CHF (congestive heart failure) (HCC) 02/28/2024   Depression 02/28/2024   Hyponatremia 02/28/2024   Hypothyroidism 12/20/2022   Degenerative disc disease, cervical 10/12/2022   Smoker 08/24/2022   Left carpal tunnel syndrome 07/14/2022   Ulnar neuropathy at elbow, left 07/14/2022  Pronation deformity of ankle, acquired 05/11/2022   Left hand weakness 02/12/2022   Gastritis and gastroduodenitis    Melena    Microcytic anemia    Atrial fibrillation, persistent (HCC)    Shortness of breath 06/19/2021   Hyperlipemia 06/19/2021   Chronic GERD 06/19/2021   Bilateral lower extremity edema 01/14/2021   Claudication in peripheral  vascular disease (HCC) 06/04/2019   Dyslipidemia, goal LDL below 70 02/20/2019   Peripheral arterial disease (HCC) 02/03/2019   Diabetes (HCC) 01/04/2018   Nocturia 01/04/2018   Right leg pain 12/13/2017   Health care maintenance 07/17/2015   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   Essential (primary) hypertension 06/30/2014   Polyp of colon 06/30/2014   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   Incomplete emptying of bladder 08/10/2013   Nodular prostate with urinary obstruction 08/10/2013   Prostate nodule 08/10/2013   PCP:  Lauro Regulus, MD Pharmacy:   [facility] [place], [place] - [address] [place] [address] Phone: [phone] Fax: [phone]     Social Drivers of Health (SDOH) Social History: SDOH Screenings   Food Insecurity: Patient Declined (02/28/2024)  Housing: Low Risk  (02/28/2024)  Transportation Needs: No Transportation Needs (02/28/2024)  Utilities: Patient Declined (02/28/2024)  Financial Resource Strain: Low Risk  (06/22/2023)   Received from [facility]  Social Connections: Unknown (02/28/2024)  Tobacco Use: High Risk (02/28/2024)   SDOH Interventions:     Readmission Risk Interventions     No data to display

## 2024-03-01 NOTE — Progress Notes (Signed)
 Physical Therapy Treatment Patient Details Name: Gregory Shepherd MRN: [id number] DOB: Sep 22, 1946 Today's Date: 03/01/2024   History of Present Illness Pt is a 78 y.o. male s/p L hip closed reduction percutaneous pinning on 3/25. Pt presented to ED after 4 falls on 3/16 resulting in L femoral neck fx/multiple abrasions to elbows, but left before results given. PMH significant for PMH of CAD/CM/CABG/EF 35%, A. Fib, cardioversion on 02/17/24, DM 2, HTN, PVD, L carpal tunnel, L ulnar neuropathy, HLD, depression, anemia. Pt had hypotensive presyncope on POD1 and lesser degree on POD2.    PT Comments  Author returns for 2nd session at end of day, pt reports pretty tired at this point, no longer prepared for extensive rehab activity. Pt reports generally good tolerance to sitting up in chair x 2 hours today. Pt still requiring +2 from nursing for simple transfers, albeit he did tolerate standing x2 minutes for orthostatic vitals, but clearly still limited by hypotensive presyncope. Educated pt on heel slides ROM exercise, which are well tolerated in partial range without any assistance, pt encouraged to perform again later tonight. Will continue to follow.   Orthostatic VS for the past 24 hrs:  BP- Lying Pulse- Lying BP- Sitting Pulse- Sitting BP- Standing at 0 minutes Pulse- Standing at 0 minutes  03/01/24 1440 -- -- -- -- 107/43 57  03/01/24 1438 -- -- 137/50 57 -- --  03/01/24 1435 148/52 55 -- -- -- --       If plan is discharge home, recommend the following: A lot of help with bathing/dressing/bathroom;A lot of help with walking and/or transfers;Assist for transportation;Help with stairs or ramp for entrance   Can travel by private vehicle     Yes  Equipment Recommendations  None recommended by PT    Recommendations for Other Services       Precautions / Restrictions Precautions Precautions: Fall Restrictions LLE Weight Bearing Per Provider Order: Weight bearing as tolerated      Mobility  Bed Mobility Overal bed mobility:  (pt too fatigued, would like to defer to next day) Bed Mobility: Supine to Sit     Supine to sit: Mod assist, HOB elevated     General bed mobility comments: 2 UE assist    Transfers Overall transfer level: Needs assistance Equipment used: 1 person hand held assist Transfers: Bed to chair/wheelchair/BSC, Sit to/from Stand Sit to Stand: Min assist   Step pivot transfers: Min assist       General transfer comment: EOB to [facility], then STS from commode for pericare and then recliner is brought up from behind.    Ambulation/Gait                   Stairs             Wheelchair Mobility     Tilt Bed    Modified Rankin (Stroke Patients Only)       Balance                                            Communication    Cognition Arousal: Alert Behavior During Therapy: WFL for tasks assessed/performed   PT - Cognitive impairments: No apparent impairments                                Cueing  Exercises General Exercises - Lower Extremity Heel Slides: AROM, Both, 20 reps, Supine    General Comments        Pertinent Vitals/Pain Pain Assessment Pain Assessment: 0-10 Pain Score: 3  Pain Location: hip Pain Descriptors / Indicators: Operative site guarding Pain Intervention(s): Limited activity within patient's tolerance, Monitored during session, Premedicated before session    Home Living                          Prior Function            PT Goals (current goals can now be found in the care plan section) Acute Rehab PT Goals Patient Stated Goal: To get stronger and eventually return home. PT Goal Formulation: With patient/family Time For Goal Achievement: 03/14/24 Potential to Achieve Goals: Fair Progress towards PT goals: Progressing toward goals    Frequency    Min 3X/week      PT Plan      Co-evaluation              AM-PAC PT "6  Clicks" Mobility   Outcome Measure  Help needed turning from your back to your side while in a flat bed without using bedrails?: A Lot Help needed moving from lying on your back to sitting on the side of a flat bed without using bedrails?: A Lot Help needed moving to and from a bed to a chair (including a wheelchair)?: A Lot Help needed standing up from a chair using your arms (e.g., wheelchair or bedside chair)?: A Lot Help needed to walk in hospital room?: A Lot Help needed climbing 3-5 steps with a railing? : A Lot 6 Click Score: 12    End of Session   Activity Tolerance: Patient tolerated treatment well Patient left: in bed;with call bell/phone within reach;with bed alarm set Nurse Communication: Mobility status PT Visit Diagnosis: Unsteadiness on feet (R26.81);Other abnormalities of gait and mobility (R26.89);Muscle weakness (generalized) (M62.81);History of falling (Z91.81);Dizziness and giddiness (R42)     Time: 1610-9604 PT Time Calculation (min) (ACUTE ONLY): 11 min  Charges:    $Therapeutic Activity: 8-22 mins PT General Charges $$ ACUTE PT VISIT: 1 Visit                    5:16 PM, 03/01/24 Rosamaria Lints, PT, DPT Physical Therapist - [facility]  [phone] (ASCOM)    Gregory Shepherd 03/01/2024, 5:13 PM

## 2024-03-01 NOTE — Progress Notes (Signed)
   Subjective: 2 Days Post-Op Procedure(s) (LRB): PINNING, EXTREMITY, PERCUTANEOUS (Left) Patient reports pain as mild.   No complaints Foley catheter placed yesterday for urinary retention Denies any CP, SOB, ABD pain. We will continue therapy today. Slow progress with PT Plan is to go Skilled nursing facility after hospital stay.  Objective: Vital signs in last 24 hours: Temp:  [97.5 F (36.4 C)-98.7 F (37.1 C)] 97.6 F (36.4 C) (03/27 0741) Pulse Rate:  [52-58] 54 (03/27 0741) Resp:  [16-18] 18 (03/27 0741) BP: (132-148)/(49-52) 148/52 (03/27 0741) SpO2:  [91 %-98 %] 98 % (03/27 0741)  Intake/Output from previous day: 03/26 0701 - 03/27 0700 In: 700 [P.O.:200; IV Piggyback:500] Out: 2250 [Urine:2250] Intake/Output this shift: Total I/O In: 210 [P.O.:210] Out: -   Recent Labs    02/28/24 1910 02/29/24 0854  HGB 13.7 13.0   Recent Labs    02/28/24 1910 02/29/24 0854  WBC 7.2 11.8*  RBC 4.29 4.06*  HCT 39.1 36.3*  PLT 215 245   Recent Labs    02/29/24 0352 03/01/24 0331  NA 131* 132*  K 4.0 3.5  CL 102 101  CO2 21* 25  BUN 17 18  CREATININE 0.89 0.94  GLUCOSE 101* 90  CALCIUM 8.3* 7.9*   No results for input(s): "LABPT", "INR" in the last 72 hours.  EXAM General - Patient is Alert, Appropriate, and Oriented Extremity - Neurovascular intact Sensation intact distally Intact pulses distally Dorsiflexion/Plantar flexion intact Dressing - dressing C/D/I Motor Function - intact, moving foot and toes well on exam.   Past Medical History:  Diagnosis Date   Carotid artery narrowing    History of blood clots    History of colon polyps    Hyperlipidemia    Hypertension    MI (myocardial infarction) (HCC)     Assessment/Plan:   2 Days Post-Op Procedure(s) (LRB): PINNING, EXTREMITY, PERCUTANEOUS (Left) Principal Problem:   Fracture of femoral neck, left, closed (HCC) Active Problems:   BPH with obstruction/lower urinary tract symptoms    Essential (primary) hypertension   Dyslipidemia, goal LDL below 70   Atrial fibrillation, persistent (HCC)   Smoker   CAD (coronary artery disease)   Chronic combined systolic and diastolic CHF (congestive heart failure) (HCC)   Depression   Hyponatremia   Orthostatic hypotension  Estimated body mass index is 23.73 kg/m as calculated from the following:   Height as of 02/20/24: 6' (1.829 m).   Weight as of 02/20/24: 79.4 kg. Advance diet Up with therapy, slow progress with PT. Needs assistance. Little assistance at home Pain controlled VSS Labs stable Urinary retention - Foley catheter placed Appreciate hospitalist input CM to assist with discharge to home with SNF  DVT Prophylaxis - TED hose and SCDs Eliquis Weight-Bearing as tolerated to left leg   T. Cranston Neighbor, PA-C [facility] 03/01/2024, 10:11 AM

## 2024-03-01 NOTE — Progress Notes (Signed)
 Physical Therapy Treatment Patient Details Name: Gregory Shepherd MRN: [id number] DOB: Jun 03, 1946 Today's Date: 03/01/2024   History of Present Illness Pt is a 78 y.o. male s/p L hip closed reduction percutaneous pinning on 3/25. Pt presented to ED after 4 falls on 3/16 resulting in L femoral neck fx/multiple abrasions to elbows, but left before results given. PMH significant for PMH of CAD/CM/CABG/EF 35%, A. Fib, cardioversion on 02/17/24, DM 2, HTN, PVD, L carpal tunnel, L ulnar neuropathy, HLD, depression, anemia    PT Comments  Pt in bed on entry, NA in room preparing to assist with pericare s/p incontinence episode. Pt given modA to EOB, then minA twice for transfers. Pt able to tolerate upright much better today than yesterday, balance still quite limited initially, but improved a little on 2nd transfer. Session ended early to allow for focus on linen cleanup and patient care. Will return later in day as time permits.    If plan is discharge home, recommend the following: A lot of help with bathing/dressing/bathroom;A lot of help with walking and/or transfers;Assist for transportation;Help with stairs or ramp for entrance   Can travel by private vehicle     Yes  Equipment Recommendations  None recommended by PT    Recommendations for Other Services       Precautions / Restrictions Precautions Precautions: Fall Restrictions LLE Weight Bearing Per Provider Order: Weight bearing as tolerated     Mobility  Bed Mobility Overal bed mobility: Needs Assistance Bed Mobility: Supine to Sit     Supine to sit: Mod assist, HOB elevated     General bed mobility comments: 2 UE assist    Transfers Overall transfer level: Needs assistance Equipment used: 1 person hand held assist Transfers: Bed to chair/wheelchair/BSC, Sit to/from Stand Sit to Stand: Min assist   Step pivot transfers: Min assist       General transfer comment: EOB to [facility], then STS from commode for pericare and  then recliner is brought up from behind.    Ambulation/Gait                   Stairs             Wheelchair Mobility     Tilt Bed    Modified Rankin (Stroke Patients Only)       Balance                                            Communication    Cognition Arousal: Alert Behavior During Therapy: WFL for tasks assessed/performed                                    Cueing    Exercises      General Comments        Pertinent Vitals/Pain Pain Assessment Pain Assessment: 0-10 Pain Score: 3  Pain Location: hip Pain Descriptors / Indicators: Operative site guarding Pain Intervention(s): Limited activity within patient's tolerance, Monitored during session    Home Living                          Prior Function            PT Goals (current goals can now be found in the care plan section)  Acute Rehab PT Goals Patient Stated Goal: To get stronger and eventually return home. PT Goal Formulation: With patient/family Time For Goal Achievement: 03/14/24 Potential to Achieve Goals: Fair Progress towards PT goals: Progressing toward goals    Frequency    Min 3X/week      PT Plan      Co-evaluation              AM-PAC PT "6 Clicks" Mobility   Outcome Measure  Help needed turning from your back to your side while in a flat bed without using bedrails?: A Lot Help needed moving from lying on your back to sitting on the side of a flat bed without using bedrails?: A Lot Help needed moving to and from a bed to a chair (including a wheelchair)?: A Lot Help needed standing up from a chair using your arms (e.g., wheelchair or bedside chair)?: A Lot Help needed to walk in hospital room?: A Lot Help needed climbing 3-5 steps with a railing? : A Lot 6 Click Score: 12    End of Session   Activity Tolerance: Patient tolerated treatment well;No increased pain Patient left: with family/visitor present;in  chair;with nursing/sitter in room Nurse Communication: Mobility status PT Visit Diagnosis: Unsteadiness on feet (R26.81);Other abnormalities of gait and mobility (R26.89);Muscle weakness (generalized) (M62.81);History of falling (Z91.81);Dizziness and giddiness (R42)     Time: 4098-1191 PT Time Calculation (min) (ACUTE ONLY): 18 min  Charges:    $Therapeutic Activity: 8-22 mins PT General Charges $$ ACUTE PT VISIT: 1 Visit                    4:52 PM, 03/01/24 Rosamaria Lints, PT, DPT Physical Therapist - [facility]  [phone] (ASCOM)     Carlas Vandyne C 03/01/2024, 4:50 PM

## 2024-03-02 MED ORDER — TAMSULOSIN HCL 0.4 MG PO CAPS: 0.4000 mg | ORAL_CAPSULE | Freq: Every day | ORAL | Status: DC

## 2024-03-02 NOTE — TOC Progression Note (Addendum)
 Transition of Care [facility]) - Progression Note    Patient Details  Name: Gregory Shepherd MRN: [id number] Date of Birth: 1946-03-12  Transition of [facility] CM/SW Contact  Liliana Cline, LCSW Phone Number: 03/02/2024, 9:20 AM  Clinical Narrative:   [name] is pending.  12:05- [name] is pending. Spoke with [facility], she will follow up with on site staff about admission today.   Barriers to Discharge: Continued Medical Work up  Expected Discharge Plan and Services     Post Acute Care Choice: Skilled Nursing Facility Living arrangements for the past 2 months: Single Family Home Expected Discharge Date: 03/02/24                                     Social Determinants of Health (SDOH) Interventions SDOH Screenings   Food Insecurity: Patient Declined (02/28/2024)  Housing: Low Risk  (02/28/2024)  Transportation Needs: No Transportation Needs (02/28/2024)  Utilities: Patient Declined (02/28/2024)  Financial Resource Strain: Low Risk  (06/22/2023)   Received from [facility]  Social Connections: Unknown (02/28/2024)  Tobacco Use: High Risk (02/28/2024)    Readmission Risk Interventions     No data to display

## 2024-03-02 NOTE — TOC Transition Note (Signed)
 Transition of Care [facility]) - Discharge Note   Patient Details  Name: Gregory Shepherd MRN: [id number] Date of Birth: Apr 18, 1946  Transition of Care [facility]) CM/SW Contact:  Liliana Cline, LCSW Phone Number: 03/02/2024, 1:04 PM   Clinical Narrative:    Discharge to [facility] today. Room 603. Confirmed with Admissions Worker Tobi Bastos. Updated MD, RN, and spouse. Asked RN to call report. Spouse and son to transport around 3pm, updated care team and Tobi Bastos.    Final next level of care: Skilled Nursing Facility Barriers to Discharge: Barriers Resolved   Patient Goals and CMS Choice   CMS Medicare.gov Compare Post Acute Care list provided to:: Patient Represenative (must comment) Choice offered to / list presented to : Spouse      Discharge Placement              Patient chooses bed at: [facility] Patient to be transferred to facility by: wife and son Name of family member notified: Alroy Dust Patient and family notified of of transfer: 03/02/24  Discharge Plan and Services Additional resources added to the After Visit Summary for       Post Acute Care Choice: Skilled Nursing Facility                               Social Drivers of Health (SDOH) Interventions SDOH Screenings   Food Insecurity: Patient Declined (02/28/2024)  Housing: Low Risk  (02/28/2024)  Transportation Needs: No Transportation Needs (02/28/2024)  Utilities: Patient Declined (02/28/2024)  Financial Resource Strain: Low Risk  (06/22/2023)   Received from [facility]  Social Connections: Unknown (02/28/2024)  Tobacco Use: High Risk (02/28/2024)     Readmission Risk Interventions     No data to display

## 2024-03-02 NOTE — Plan of Care (Signed)

## 2024-03-02 NOTE — Progress Notes (Signed)
 PT Cancellation Note  Patient Details Name: Gregory Shepherd MRN: [id number] DOB: 1946-06-08   Cancelled Treatment:    Reason Eval/Treat Not Completed: Fatigue/lethargy limiting ability to participate;Patient declined, no reason specified (Chart reviewed, treatment attempted. Pt reports he does not feel well right now, this would not be a good time to do PT.) Pt remains very tired and has not yet received his breakfast. Pt declines PT at this time. Appears PT has DC order in now, anticipate DC to facility later in day.   9:14 AM, 03/02/24 Rosamaria Lints, PT, DPT Physical Therapist - [facility]  [phone] (ASCOM)     Rollie Hynek C 03/02/2024, 9:12 AM

## 2024-03-02 NOTE — Care Management Important Message (Signed)
 Important Message  Patient Details  Name: WILLMER FELLERS MRN: [id number] Date of Birth: 1946/03/25   Important Message Given:  Yes - Medicare IM     Bernadette Hoit 03/02/2024, 10:29 AM

## 2024-03-02 NOTE — Progress Notes (Signed)
   Subjective: 3 Days Post-Op Procedure(s) (LRB): PINNING, EXTREMITY, PERCUTANEOUS (Left) Patient reports pain as mild.   No complaints Foley catheter placed for urinary retention Denies any CP, SOB, ABD pain. We will continue therapy today. Slow progress with PT Plan is to go Skilled nursing facility after hospital stay.  Objective: Vital signs in last 24 hours: Temp:  [97.9 F (36.6 C)-98.9 F (37.2 C)] 97.9 F (36.6 C) (03/28 0811) Pulse Rate:  [51-55] 51 (03/28 0811) Resp:  [16-18] 18 (03/28 0811) BP: (117-153)/(44-60) 153/56 (03/28 0811) SpO2:  [93 %-100 %] 93 % (03/28 0811) Weight:  [76.2 kg] 76.2 kg (03/27 1443)  Intake/Output from previous day: 03/27 0701 - 03/28 0700 In: 210 [P.O.:210] Out: 1800 [Urine:1800] Intake/Output this shift: No intake/output data recorded.  Recent Labs    02/28/24 1910 02/29/24 0854  HGB 13.7 13.0   Recent Labs    02/28/24 1910 02/29/24 0854  WBC 7.2 11.8*  RBC 4.29 4.06*  HCT 39.1 36.3*  PLT 215 245   Recent Labs    02/29/24 0352 03/01/24 0331  NA 131* 132*  K 4.0 3.5  CL 102 101  CO2 21* 25  BUN 17 18  CREATININE 0.89 0.94  GLUCOSE 101* 90  CALCIUM 8.3* 7.9*   No results for input(s): "LABPT", "INR" in the last 72 hours.  EXAM General - Patient is Alert, Appropriate, and Oriented Extremity - Neurovascular intact Sensation intact distally Intact pulses distally Dorsiflexion/Plantar flexion intact Dressing - dressing C/D/I Motor Function - intact, moving foot and toes well on exam.   Past Medical History:  Diagnosis Date   Carotid artery narrowing    History of blood clots    History of colon polyps    Hyperlipidemia    Hypertension    MI (myocardial infarction) (HCC)     Assessment/Plan:   3 Days Post-Op Procedure(s) (LRB): PINNING, EXTREMITY, PERCUTANEOUS (Left) Principal Problem:   Fracture of femoral neck, left, closed (HCC) Active Problems:   BPH with obstruction/lower urinary tract symptoms    Essential (primary) hypertension   Dyslipidemia, goal LDL below 70   Atrial fibrillation, persistent (HCC)   Smoker   CAD (coronary artery disease)   Chronic combined systolic and diastolic CHF (congestive heart failure) (HCC)   Depression   Hyponatremia   Orthostatic hypotension   Malnutrition of moderate degree  Estimated body mass index is 22.78 kg/m as calculated from the following:   Height as of this encounter: 6' (1.829 m).   Weight as of this encounter: 76.2 kg. Advance diet Up with therapy, slow progress with PT. Needs assistance. Little assistance at home Pain controlled VSS Urinary retention - Foley catheter placed, patient to have voiding trial in 1 week Appreciate hospitalist input CM to assist with discharge to home with SNF, likely discharge to SNF today pending insurance authorization  DVT Prophylaxis - TED hose and SCDs Eliquis Weight-Bearing as tolerated to left leg   T. Cranston Neighbor, PA-C [facility] 03/02/2024, 8:38 AM

## 2024-03-02 NOTE — Discharge Summary (Signed)
 Physician Discharge Summary  Patient ID: Gregory Shepherd MRN: [id number] DOB/AGE: 1946-09-14 78 y.o.  Admit date: 02/28/2024 Discharge date: 03/02/2024  Admission Diagnoses:  Fracture of femoral neck, left, closed (HCC) [S72.002A]   Discharge Diagnoses: Patient Active Problem List   Diagnosis Date Noted   Malnutrition of moderate degree 03/01/2024   Orthostatic hypotension 02/29/2024   Fracture of femoral neck, left, closed (HCC) 02/28/2024   CAD (coronary artery disease) 02/28/2024   Chronic combined systolic and diastolic CHF (congestive heart failure) (HCC) 02/28/2024   Depression 02/28/2024   Hyponatremia 02/28/2024   Hypothyroidism 12/20/2022   Degenerative disc disease, cervical 10/12/2022   Smoker 08/24/2022   Left carpal tunnel syndrome 07/14/2022   Ulnar neuropathy at elbow, left 07/14/2022   Pronation deformity of ankle, acquired 05/11/2022   Left hand weakness 02/12/2022   Gastritis and gastroduodenitis    Melena    Microcytic anemia    Atrial fibrillation, persistent (HCC)    Shortness of breath 06/19/2021   Hyperlipemia 06/19/2021   Chronic GERD 06/19/2021   Bilateral lower extremity edema 01/14/2021   Claudication in peripheral vascular disease (HCC) 06/04/2019   Dyslipidemia, goal LDL below 70 02/20/2019   Peripheral arterial disease (HCC) 02/03/2019   Diabetes (HCC) 01/04/2018   Nocturia 01/04/2018   Right leg pain 12/13/2017   Health care maintenance 07/17/2015   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   Essential (primary) hypertension 06/30/2014   Polyp of colon 06/30/2014   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   Incomplete emptying of bladder 08/10/2013   Nodular prostate with urinary obstruction 08/10/2013   Prostate nodule 08/10/2013    Past Medical History:  Diagnosis Date   Carotid artery narrowing    History of blood clots    History of colon polyps     Hyperlipidemia    Hypertension    MI (myocardial infarction) (HCC)      Transfusion: None   Consultants (if any): Treatment Team:  Arnetha Courser, MD Darlin Priestly, MD  Discharged Condition: Improved  Hospital Course: Gregory Shepherd is an 78 y.o. male who was admitted 02/28/2024 with a diagnosis of Fracture of femoral neck, left, closed (HCC) and went to the operating room on 02/28/2024 and underwent the above named procedures.    Surgeries: Procedure(s): PINNING, EXTREMITY, PERCUTANEOUS on 02/28/2024 Patient tolerated the surgery well. Taken to PACU where she was stabilized and then transferred to the orthopedic floor.  Started on Eliquis. TEDs and SCDs applied bilaterally. Heels elevated on bed. No evidence of DVT. Negative Homan. Physical therapy started on day #1 for gait training and transfer. OT started day #1 for ADL and assisted devices.  Patient's IV was d/c on day #1.  Slow progress of physical therapy on postop day 1.  PT recommended discharge to skilled nursing facility.  Patient with urinary retention on postop day 1, had In-N-Out cath x 2 then Foley catheter was placed.  Foley catheter to remain intact for 7 days.  Skilled nursing facility to remove Foley catheter on 03/06/2024 and perform voiding trial. On post op day # 3 patient was stable and ready for discharge to skilled nursing facility.  Implants: Zimmer 6.7mm cannulated stainless screws, 100/90/90 with one washer    He was given perioperative antibiotics:  Anti-infectives (From admission, onward)    Start     Dose/Rate Route Frequency Ordered Stop   02/28/24 0945  ceFAZolin (ANCEF) IVPB 2g/100 mL premix  2 g 200 mL/hr over 30 Minutes Intravenous  Once 02/28/24 0940 02/28/24 1050     .  He was given sequential compression devices, early ambulation, and Eliquis for DVT prophylaxis.  He benefited maximally from the hospital stay and there were no complications.    Recent vital signs:  Vitals:   03/02/24  0500 03/02/24 0811  BP: (!) 151/55 (!) 153/56  Pulse: (!) 55 (!) 51  Resp: 18 18  Temp: 98.9 F (37.2 C) 97.9 F (36.6 C)  SpO2: 94% 93%    Recent laboratory studies:  Lab Results  Component Value Date   HGB 13.0 02/29/2024   HGB 13.7 02/28/2024   HGB 14.4 02/20/2024   Lab Results  Component Value Date   WBC 11.8 (H) 02/29/2024   PLT 245 02/29/2024   No results found for: "INR" Lab Results  Component Value Date   NA 132 (L) 03/01/2024   K 3.5 03/01/2024   CL 101 03/01/2024   CO2 25 03/01/2024   BUN 18 03/01/2024   CREATININE 0.94 03/01/2024   GLUCOSE 90 03/01/2024    Discharge Medications:   Allergies as of 03/02/2024       Reactions   Omeprazole    doesn't work well with eliquis    Other Palpitations   Certain steroids give patient heart palpitations  Allergy to green peppers causes upset stomach         Medication List     TAKE these medications    acetaminophen 325 MG tablet Commonly known as: TYLENOL Take 2 tablets (650 mg total) by mouth every 6 (six) hours as needed for mild pain (pain score 1-3) or fever. What changed:  medication strength how much to take reasons to take this   amiodarone 200 MG tablet Commonly known as: PACERONE Take 200 mg by mouth daily.   ascorbic acid 500 MG tablet Commonly known as: VITAMIN C Take 500 mg by mouth 2 (two) times daily.   atorvastatin 80 MG tablet Commonly known as: LIPITOR Take 80 mg by mouth daily.   carvedilol 3.125 MG tablet Commonly known as: COREG TAKE ONE TABLET BY MOUTH TWICE A DAY   cyanocobalamin 1000 MCG tablet Commonly known as: VITAMIN B12 Take 1,000 mcg by mouth daily.   docusate sodium 100 MG capsule Commonly known as: COLACE Take 1 capsule (100 mg total) by mouth 2 (two) times daily.   DULoxetine 20 MG capsule Commonly known as: CYMBALTA Take 1 capsule (20 mg total) by mouth daily.   Eliquis 5 MG Tabs tablet Generic drug: apixaban TAKE ONE TABLET BY MOUTH TWICE A DAY    ferrous sulfate 325 (65 FE) MG tablet Take 325 mg by mouth daily.   finasteride 5 MG tablet Commonly known as: PROSCAR Take 1 tablet (5 mg total) by mouth daily. What changed: when to take this   gabapentin 600 MG tablet Commonly known as: NEURONTIN Take 300 mg by mouth 2 (two) times daily.   HYDROcodone-acetaminophen 5-325 MG tablet Commonly known as: NORCO/VICODIN Take 1 tablet by mouth every 6 (six) hours as needed for moderate pain (pain score 4-6).   isosorbide mononitrate 60 MG 24 hr tablet Commonly known as: IMDUR Take 1.5 tablets (90 mg total) by mouth daily.   lansoprazole 30 MG capsule Commonly known as: PREVACID TAKE ONE CAPSULE BY MOUTH DAILY   multivitamin tablet Take 1 tablet by mouth daily.   nitroGLYCERIN 0.4 MG SL tablet Commonly known as: NITROSTAT PLACE ONE TABLET UNDER THE TONGUE  AS NEEDED FOR CHEST PAIN   potassium chloride 10 MEQ tablet Commonly known as: KLOR-CON TAKE TWO TABLETS BY MOUTH TWICE A DAY What changed:  how much to take when to take this   pyridOXINE 100 MG tablet Commonly known as: VITAMIN B6 Take 100 mg by mouth 2 (two) times daily.   ranolazine 500 MG 12 hr tablet Commonly known as: RANEXA Take 1 tablet (500 mg total) by mouth 2 (two) times daily.   tamsulosin 0.4 MG Caps capsule Commonly known as: FLOMAX Take 1 capsule (0.4 mg total) by mouth daily.   torsemide 20 MG tablet Commonly known as: DEMADEX Take 1 tablet (20 mg total) by mouth 2 (two) times daily. What changed: when to take this   traZODone 50 MG tablet Commonly known as: DESYREL Take 50 mg by mouth at bedtime.   Vitamin D 50 MCG (2000 UT) Caps Take 4,000 Units by mouth daily.        Diagnostic Studies: DG HIP UNILAT WITH PELVIS 2-3 VIEWS LEFT Result Date: 02/28/2024 CLINICAL DATA:  Elective surgery.  Left hip pinning. EXAM: DG HIP (WITH OR WITHOUT PELVIS) 2-3V LEFT COMPARISON:  CT left hip 02/27/2024 FINDINGS: Images were performed intraoperatively  without the presence of a radiologist. There are 3 new longitudinal screws traversing the left femoral head, neck, and intertrochanteric region, spanning the previously seen proximal left femoral neck fracture. No hardware complication is seen. Total fluoroscopy images: 2 Total fluoroscopy time: 80 seconds Total dose: Radiation Exposure Index (as provided by the fluoroscopic device): 14.9 mGy air Kerma Please see intraoperative findings for further detail. IMPRESSION: Intraoperative fluoroscopy for left hip pinning. Electronically Signed   By: Neita Garnet M.D.   On: 02/28/2024 13:09   DG C-Arm 1-60 Min-No Report Result Date: 02/28/2024 Fluoroscopy was utilized by the requesting physician.  No radiographic interpretation.   CT HIP LEFT WO CONTRAST Result Date: 02/27/2024 CLINICAL DATA:  Patient fell on 02/18/2024.  Pain and abrasions. EXAM: CT OF THE LEFT HIP WITHOUT CONTRAST TECHNIQUE: Multidetector CT imaging of the left hip was performed according to the standard protocol. Multiplanar CT image reconstructions were also generated. RADIATION DOSE REDUCTION: This exam was performed according to the departmental dose-optimization program which includes automated exposure control, adjustment of the mA and/or kV according to patient size and/or use of iterative reconstruction technique. COMPARISON:  CT chest abdomen and pelvis 01/24/2024 FINDINGS: Bones/Joint/Cartilage Linear sclerosis along the subcapital region of the left femoral neck with slight deformity and cortical irregularity consistent with impacted minimally displaced fracture. Visualized acetabulum and left hemipelvis appear intact. No focal bone lesion or bone destruction. Ligaments Suboptimally assessed by CT. Muscles and Tendons No intramuscular mass or hematoma. Soft tissues Soft tissues are unremarkable. Incidental note of prostate gland enlargement, measuring 6.3 cm in diameter. IMPRESSION: 1. Transverse impacted subcapital fracture of the left  hip. 2. Prostate enlargement. Electronically Signed   By: Burman Nieves M.D.   On: 02/27/2024 19:18   CT Head Wo Contrast Result Date: 02/20/2024 CLINICAL DATA:  Trauma. EXAM: CT HEAD WITHOUT CONTRAST CT CERVICAL SPINE WITHOUT CONTRAST TECHNIQUE: Multidetector CT imaging of the head and cervical spine was performed following the standard protocol without intravenous contrast. Multiplanar CT image reconstructions of the cervical spine were also generated. RADIATION DOSE REDUCTION: This exam was performed according to the departmental dose-optimization program which includes automated exposure control, adjustment of the mA and/or kV according to patient size and/or use of iterative reconstruction technique. COMPARISON:  None Available. FINDINGS: CT  HEAD FINDINGS Brain: Moderate age-related atrophy and chronic microvascular ischemic changes. There is no acute intracranial hemorrhage. No mass effect or midline shift. No extra-axial fluid collection. Vascular: No hyperdense vessel or unexpected calcification. Skull: Normal. Negative for fracture or focal lesion. Sinuses/Orbits: No acute finding. Other: None CT CERVICAL SPINE FINDINGS Alignment: No acute subluxation. There is straightening of normal cervical lordosis which may be positional or due to muscle spasm. Skull base and vertebrae: No acute fracture.  Osteopenia. Soft tissues and spinal canal: No prevertebral fluid or swelling. No visible canal hematoma. Disc levels:  No acute findings.  Degenerative changes. Upper chest: Negative. Other: Bilateral carotid bulb calcified plaques. IMPRESSION: 1. No acute intracranial pathology. Moderate age-related atrophy and chronic microvascular ischemic changes. 2. No acute/traumatic cervical spine pathology. Electronically Signed   By: Elgie Collard M.D.   On: 02/20/2024 18:10   CT Cervical Spine Wo Contrast Result Date: 02/20/2024 CLINICAL DATA:  Trauma. EXAM: CT HEAD WITHOUT CONTRAST CT CERVICAL SPINE WITHOUT  CONTRAST TECHNIQUE: Multidetector CT imaging of the head and cervical spine was performed following the standard protocol without intravenous contrast. Multiplanar CT image reconstructions of the cervical spine were also generated. RADIATION DOSE REDUCTION: This exam was performed according to the departmental dose-optimization program which includes automated exposure control, adjustment of the mA and/or kV according to patient size and/or use of iterative reconstruction technique. COMPARISON:  None Available. FINDINGS: CT HEAD FINDINGS Brain: Moderate age-related atrophy and chronic microvascular ischemic changes. There is no acute intracranial hemorrhage. No mass effect or midline shift. No extra-axial fluid collection. Vascular: No hyperdense vessel or unexpected calcification. Skull: Normal. Negative for fracture or focal lesion. Sinuses/Orbits: No acute finding. Other: None CT CERVICAL SPINE FINDINGS Alignment: No acute subluxation. There is straightening of normal cervical lordosis which may be positional or due to muscle spasm. Skull base and vertebrae: No acute fracture.  Osteopenia. Soft tissues and spinal canal: No prevertebral fluid or swelling. No visible canal hematoma. Disc levels:  No acute findings.  Degenerative changes. Upper chest: Negative. Other: Bilateral carotid bulb calcified plaques. IMPRESSION: 1. No acute intracranial pathology. Moderate age-related atrophy and chronic microvascular ischemic changes. 2. No acute/traumatic cervical spine pathology. Electronically Signed   By: Elgie Collard M.D.   On: 02/20/2024 18:10   DG Ribs Unilateral W/Chest Right Result Date: 02/20/2024 CLINICAL DATA:  Pain after fall EXAM: Bilateral RIBS AND CHEST-5 VIEW COMPARISON:  None Available. FINDINGS: Status post median sternotomy. Lower most sternal wire is fractured. Calcified aorta. Normal cardiopericardial silhouette. No pneumothorax, effusion. No consolidation or edema. Standard chest x-ray is  slightly under penetrated. Osteopenia. Rib x-rays are with osteopenia. No displaced rib fracture identified. IMPRESSION: No rib fracture seen.  No pneumothorax or effusion. Postop chest. Electronically Signed   By: Karen Kays M.D.   On: 02/20/2024 17:11   DG Ribs Unilateral W/Chest Left Result Date: 02/20/2024 CLINICAL DATA:  Pain after fall EXAM: Bilateral RIBS AND CHEST-5 VIEW COMPARISON:  None Available. FINDINGS: Status post median sternotomy. Lower most sternal wire is fractured. Calcified aorta. Normal cardiopericardial silhouette. No pneumothorax, effusion. No consolidation or edema. Standard chest x-ray is slightly under penetrated. Osteopenia. Rib x-rays are with osteopenia. No displaced rib fracture identified. IMPRESSION: No rib fracture seen.  No pneumothorax or effusion. Postop chest. Electronically Signed   By: Karen Kays M.D.   On: 02/20/2024 17:11   EP STUDY Result Date: 02/17/2024 See surgical note for result.  CARDIAC CATHETERIZATION Result Date: 02/06/2024 Conclusions: Severe native coronary artery disease,  including chronic total occlusions of LMCA, ostial/proximal LAD, ostial-mid RCA, and proximal rPLAV. Widely patent LIMA-LAD, with LAD collateralized the LCx territory. Patent [id number] with mild to moderate disease of up to 40%. Mildly-moderately reduced left ventricular systolic function (LVEF 35-45%) with global hypokinesis. Normal left ventricular filling pressure (LVEDP 8 mmHg). Recommendations: No options for percutaneous revascularization.  I will continue isosorbide mononitrate and carvedilol and add ranolazine 500 mg twice daily for antianginal therapy.  Repeat EKG with attention to QTc should be performed at follow-up with Dr. Allyson Sabal in 2 weeks. If no evidence of bleeding/vascular injury, apixaban can be resumed tonight. Escalate goal-directed medical therapy for HFmrEF as tolerated. Yvonne Kendall, MD [facility]   Disposition:      Follow-up Information      Evon Slack, PA-C Follow up in 2 week(s).   Specialties: Orthopedic Surgery, Emergency Medicine Contact information: [address] [place] [address] [phone]                  Signed: Evon Slack 03/02/2024, 8:44 AM

## 2024-03-02 NOTE — Progress Notes (Signed)
 Gave report to Deidra at [facility] and reviewed discharge instructions with patient and patient's wife. Deidra, Patient, and patient's wife acknowledged understanding. Patient discharged with personal belongings. Patient transported to [facility] via family vehicle. No distress noted in patient.

## 2024-03-02 NOTE — Plan of Care (Signed)

## 2024-03-06 ENCOUNTER — Observation Stay
Admission: EM | Admit: 2024-03-06 | Discharge: 2024-03-09 | Disposition: A | Discharge status: Home / Self Care | Attending: Internal Medicine | Admitting: Internal Medicine

## 2024-03-06 ENCOUNTER — Emergency Department

## 2024-03-06 ENCOUNTER — Encounter: Payer: Self-pay | Admitting: Emergency Medicine

## 2024-03-06 ENCOUNTER — Other Ambulatory Visit: Payer: Self-pay

## 2024-03-06 ENCOUNTER — Ambulatory Visit: Admitting: Cardiovascular Disease

## 2024-03-06 DIAGNOSIS — E440 Moderate protein-calorie malnutrition: Secondary | ICD-10-CM | POA: Diagnosis not present

## 2024-03-06 DIAGNOSIS — E876 Hypokalemia: Secondary | ICD-10-CM | POA: Diagnosis not present

## 2024-03-06 DIAGNOSIS — Z79899 Other long term (current) drug therapy: Secondary | ICD-10-CM | POA: Insufficient documentation

## 2024-03-06 DIAGNOSIS — R042 Hemoptysis: Secondary | ICD-10-CM | POA: Diagnosis present

## 2024-03-06 DIAGNOSIS — I4819 Other persistent atrial fibrillation: Secondary | ICD-10-CM | POA: Diagnosis not present

## 2024-03-06 DIAGNOSIS — X58XXXA Exposure to other specified factors, initial encounter: Secondary | ICD-10-CM | POA: Insufficient documentation

## 2024-03-06 DIAGNOSIS — Z8679 Personal history of other diseases of the circulatory system: Secondary | ICD-10-CM | POA: Insufficient documentation

## 2024-03-06 DIAGNOSIS — F17210 Nicotine dependence, cigarettes, uncomplicated: Secondary | ICD-10-CM | POA: Insufficient documentation

## 2024-03-06 DIAGNOSIS — E785 Hyperlipidemia, unspecified: Secondary | ICD-10-CM | POA: Diagnosis not present

## 2024-03-06 DIAGNOSIS — S72002A Fracture of unspecified part of neck of left femur, initial encounter for closed fracture: Secondary | ICD-10-CM | POA: Diagnosis not present

## 2024-03-06 DIAGNOSIS — K921 Melena: Principal | ICD-10-CM | POA: Insufficient documentation

## 2024-03-06 DIAGNOSIS — I2510 Atherosclerotic heart disease of native coronary artery without angina pectoris: Secondary | ICD-10-CM | POA: Insufficient documentation

## 2024-03-06 DIAGNOSIS — I110 Hypertensive heart disease with heart failure: Secondary | ICD-10-CM | POA: Insufficient documentation

## 2024-03-06 DIAGNOSIS — K529 Noninfective gastroenteritis and colitis, unspecified: Secondary | ICD-10-CM | POA: Insufficient documentation

## 2024-03-06 DIAGNOSIS — F32A Depression, unspecified: Secondary | ICD-10-CM | POA: Insufficient documentation

## 2024-03-06 DIAGNOSIS — Z7901 Long term (current) use of anticoagulants: Secondary | ICD-10-CM | POA: Diagnosis not present

## 2024-03-06 DIAGNOSIS — I5042 Chronic combined systolic (congestive) and diastolic (congestive) heart failure: Secondary | ICD-10-CM | POA: Diagnosis not present

## 2024-03-06 DIAGNOSIS — G5602 Carpal tunnel syndrome, left upper limb: Secondary | ICD-10-CM | POA: Diagnosis not present

## 2024-03-06 DIAGNOSIS — N403 Nodular prostate with lower urinary tract symptoms: Secondary | ICD-10-CM | POA: Diagnosis not present

## 2024-03-06 DIAGNOSIS — E871 Hypo-osmolality and hyponatremia: Secondary | ICD-10-CM | POA: Insufficient documentation

## 2024-03-06 DIAGNOSIS — E039 Hypothyroidism, unspecified: Secondary | ICD-10-CM | POA: Diagnosis not present

## 2024-03-06 DIAGNOSIS — Z951 Presence of aortocoronary bypass graft: Secondary | ICD-10-CM | POA: Diagnosis not present

## 2024-03-06 DIAGNOSIS — I10 Essential (primary) hypertension: Secondary | ICD-10-CM | POA: Diagnosis present

## 2024-03-06 LAB — URINALYSIS, ROUTINE W REFLEX MICROSCOPIC
Bilirubin Urine: NEGATIVE
Glucose, UA: NEGATIVE mg/dL
Hgb urine dipstick: NEGATIVE
Ketones, ur: NEGATIVE mg/dL
Leukocytes,Ua: NEGATIVE
Nitrite: NEGATIVE
Protein, ur: NEGATIVE mg/dL
Specific Gravity, Urine: 1.015 (ref 1.005–1.030)
pH: 5 (ref 5.0–8.0)

## 2024-03-06 LAB — COMPREHENSIVE METABOLIC PANEL WITH GFR
ALT: 41 U/L (ref 0–44)
AST: 30 U/L (ref 15–41)
Albumin: 2.2 g/dL — ABNORMAL LOW (ref 3.5–5.0)
Alkaline Phosphatase: 101 U/L (ref 38–126)
Anion gap: 5 (ref 5–15)
BUN: 17 mg/dL (ref 8–23)
CO2: 24 mmol/L (ref 22–32)
Calcium: 8.1 mg/dL — ABNORMAL LOW (ref 8.9–10.3)
Chloride: 102 mmol/L (ref 98–111)
Creatinine, Ser: 1.03 mg/dL (ref 0.61–1.24)
GFR, Estimated: >60 mL/min (ref >60)
Glucose, Bld: 115 mg/dL — ABNORMAL HIGH (ref 70–99)
Potassium: 4.3 mmol/L (ref 3.5–5.1)
Sodium: 131 mmol/L — ABNORMAL LOW (ref 135–145)
Total Bilirubin: 1.1 mg/dL (ref 0.0–1.2)
Total Protein: 5.3 g/dL — ABNORMAL LOW (ref 6.5–8.1)

## 2024-03-06 LAB — CBC
HCT: 32.5 % — ABNORMAL LOW (ref 39.0–52.0)
HCT: 33.7 % — ABNORMAL LOW (ref 39.0–52.0)
Hemoglobin: 11.5 g/dL — ABNORMAL LOW (ref 13.0–17.0)
Hemoglobin: 11.7 g/dL — ABNORMAL LOW (ref 13.0–17.0)
MCH: 32 pg (ref 26.0–34.0)
MCH: 31.3 pg (ref 26.0–34.0)
MCHC: 35.4 g/dL (ref 30.0–36.0)
MCHC: 34.7 g/dL (ref 30.0–36.0)
MCV: 90.5 fL (ref 80.0–100.0)
MCV: 90.1 fL (ref 80.0–100.0)
Platelets: 241 10*3/uL (ref 150–400)
Platelets: 264 10*3/uL (ref 150–400)
RBC: 3.59 MIL/uL — ABNORMAL LOW (ref 4.22–5.81)
RBC: 3.74 MIL/uL — ABNORMAL LOW (ref 4.22–5.81)
RDW: 13.2 % (ref 11.5–15.5)
RDW: 13.4 % (ref 11.5–15.5)
WBC: 6.9 10*3/uL (ref 4.0–10.5)
WBC: 7.7 10*3/uL (ref 4.0–10.5)
nRBC: 0 % (ref 0.0–0.2)
nRBC: 0 % (ref 0.0–0.2)

## 2024-03-06 LAB — LIPASE, BLOOD: Lipase: 27 U/L (ref 11–51)

## 2024-03-06 MED ORDER — SODIUM CHLORIDE 0.9 % IV SOLN: Freq: Once | INTRAVENOUS | Status: AC

## 2024-03-06 MED ORDER — NITROGLYCERIN 0.4 MG SL SUBL
0.4000 mg | SUBLINGUAL_TABLET | Freq: Once | SUBLINGUAL | Status: AC
  Administered 2024-03-06: 0.4 mg via SUBLINGUAL
  Filled 2024-03-06: qty 1

## 2024-03-06 MED ORDER — IOHEXOL 350 MG/ML SOLN
75.0000 mL | Freq: Once | INTRAVENOUS | Status: AC | PRN
  Administered 2024-03-06: 75 mL via INTRAVENOUS

## 2024-03-06 MED ORDER — NITROGLYCERIN 0.4 MG SL SUBL
0.4000 mg | SUBLINGUAL_TABLET | SUBLINGUAL | Status: DC | PRN
  Administered 2024-03-06 – 2024-03-07 (×3): 0.4 mg via SUBLINGUAL
  Filled 2024-03-06 (×2): qty 1

## 2024-03-06 NOTE — ED Notes (Signed)
 Pt supine in stretcher watching grandson run track and field on cellphone some lab results reported to pt and spouse pt appears in nad speaking in full clear sentences non labored resps noted equal bilateral chest rise and fall noted skin appears wdi pale pt aox4

## 2024-03-06 NOTE — ED Triage Notes (Signed)
 Patient to ED via [facility] from [facility]. Pt reports "spitting up" blood denies that he is vomiting. Started around 0530 this morning. Had left femur surgery last Monday. Takes blood thinner but has not today. Aox4 NAD noted.

## 2024-03-06 NOTE — ED Provider Notes (Signed)
 [name] Provider Note    Event Date/Time   First MD Initiated Contact with Patient 03/06/24 1617     (approximate)   History   Hematemesis   HPI  Gregory Shepherd is a 78 y.o. male with a history of CAD, CHF, atrial fibrillation, hypertension, hyperlipidemia diabetes and recent femoral neck fracture who presents with hemoptysis, early this morning, starting around 5:30 AM and lasting for about half an hour.  He states he woke up with bloody streaked phlegm in his mouth, moderate amount.  In contrast to the triage diagnosis, he denies hematemesis.  He states he had no nausea and was not vomiting.  He states that he has otherwise not had a significant cough in the last few days and has not had any further hemoptysis since 6:30 in the morning.  He denies shortness of breath.  He states he has some intermittent chest pain.  He has no leg swelling.  He states that his pain has been well-controlled.  I reviewed the past medical records including the discharge summary from the patient's recent hospitalization.  He was discharged on 3/28 after undergoing ORIF for a femoral neck fracture on 3/25.   Physical Exam   Triage Vital Signs: ED Triage Vitals  Encounter Vitals Group     BP 03/06/24 1158 (!) 145/58     Systolic BP Percentile --      Diastolic BP Percentile --      Pulse Rate 03/06/24 1158 (!) 52     Resp 03/06/24 1158 17     Temp 03/06/24 1158 97.8 F (36.6 C)     Temp Source 03/06/24 1158 Oral     SpO2 03/06/24 1158 93 %     Weight 03/06/24 1159 165 lb (74.8 kg)     Height 03/06/24 1159 6' (1.829 m)     Head Circumference --      Peak Flow --      Pain Score 03/06/24 1159 0     Pain Loc --      Pain Education --      Exclude from Growth Chart --     Most recent vital signs: Vitals:   03/06/24 2306 03/06/24 2309  BP: (!) 164/55   Pulse: 61   Resp: 18   Temp:  98.4 F (36.9 C)  SpO2: 100%      General: Alert, comfortable appearing,  no distress.  CV:  Good peripheral perfusion.  Resp:  Normal effort.  Lungs CTAB. Abd:  No distention. Other:  No calf or popliteal swelling or tenderness.  No significant peripheral edema.  Oropharynx clear.   ED Results / Procedures / Treatments   Labs (all labs ordered are listed, but only abnormal results are displayed) Labs Reviewed  COMPREHENSIVE METABOLIC PANEL WITH GFR - Abnormal; Notable for the following components:      Result Value   Sodium 131 (*)    Glucose, Bld 115 (*)    Calcium 8.1 (*)    Total Protein 5.3 (*)    Albumin 2.2 (*)    All other components within normal limits  CBC - Abnormal; Notable for the following components:   RBC 3.59 (*)    Hemoglobin 11.5 (*)    HCT 32.5 (*)    All other components within normal limits  URINALYSIS, ROUTINE W REFLEX MICROSCOPIC - Abnormal; Notable for the following components:   Color, Urine AMBER (*)    APPearance HAZY (*)    All  other components within normal limits  CBC - Abnormal; Notable for the following components:   RBC 3.74 (*)    Hemoglobin 11.7 (*)    HCT 33.7 (*)    All other components within normal limits  LIPASE, BLOOD     EKG  ED ECG REPORT I, Dionne Bucy, the attending physician, personally viewed and interpreted this ECG.  Date: 03/06/2024 EKG Time: 1709 Rate: 56 Rhythm: normal sinus rhythm QRS Axis: normal Intervals: RBBB ST/T Wave abnormalities: normal Narrative Interpretation: no evidence of acute ischemia  ED ECG REPORT I, Dionne Bucy, the attending physician, personally viewed and interpreted this ECG.  Date: 03/06/2024 EKG Time: 2328 Rate: 59 Rhythm: normal sinus rhythm QRS Axis: normal Intervals: RBBB ST/T Wave abnormalities: normal Narrative Interpretation: no evidence of acute ischemia    RADIOLOGY  CTA chest:  IMPRESSION:  1. No evidence of pulmonary embolus.  2. Trace bilateral pleural effusions and minimal dependent lower  lobe atelectasis.  3.  Bilateral bronchial wall thickening compatible with bronchitis or  reactive airway disease.  4. Multiple left rib fractures of varying ages, with more acute  fractures involving the left anterolateral third through seventh  ribs and more subacute fractures involving the left posterior fifth  through ninth ribs.  5. Aortic Atherosclerosis (ICD10-I70.0) and Emphysema (ICD10-J43.9).    CT abdomen/pelvis:  IMPRESSION:  1. Mild body wall edema in the flanks and upper thighs, trace  posterior deep pelvic ascites.  2. Moderate retained stool in the rectum with slight rectal  thickening and adjacent stranding, in this case unclear if this is  congestive in etiology or due to stercoral proctitis given the mild  body wall edema.  3. Mild wall thickening or changes of underdistention in the  ascending colon. Correlate clinically for underlying colitis.  4. Diverticulosis without evidence of diverticulitis.  5. Cholelithiasis.  6. Aortic and branch vessel atherosclerosis, heaviest in the aorta  and common iliac arteries.  7. Enlarged prostate impressing into the bladder base.  8. Mild swelling in the anterior compartment upper left thigh  musculature, probably postsurgical in etiology. Correlate clinically  for myositis.  9. Osteopenia, scoliosis and degenerative change.  10. Multilevel subacute left rib fractures partially visible,  described in detail in today's earlier report.  11. Cardiomegaly with minimal pleural effusions. Post CABG with  calcific CAD.    PROCEDURES:  Critical Care performed: No  Procedures   MEDICATIONS ORDERED IN ED: Medications  nitroGLYCERIN (NITROSTAT) SL tablet 0.4 mg (0.4 mg Sublingual Given 03/06/24 2337)  iohexol (OMNIPAQUE) 350 MG/ML injection 75 mL (75 mLs Intravenous Contrast Given 03/06/24 1800)  nitroGLYCERIN (NITROSTAT) SL tablet 0.4 mg (0.4 mg Sublingual Given 03/06/24 2139)  0.9 %  sodium chloride infusion ( Intravenous New Bag/Given 03/06/24 2309)      IMPRESSION / MDM / ASSESSMENT AND PLAN / ED COURSE  I reviewed the triage vital signs and the nursing notes.  78 year old male with PMH as noted above presents with an episode of hemoptysis, characterized as blood-streaked phlegm, lasting for about an hour this morning and now resolved for almost 10 hours.  Physical exam is unremarkable for acute findings.  Differential diagnosis includes, but is not limited to, acute bronchitis, postnasal drip, pharyngitis, pneumonia, less likely PE.  The patient is adamant that he did not have any vomiting, but that he coughed up the phlegm.  There is no evidence of GI bleed.  CMP shows no acute abnormalities.  Lipase is normal.  CBC shows a hemoglobin of  11.5 which is a slight drop when the patient was in the hospital for his surgery.  This could be related to his surgery or hospitalization.  We will obtain a 6-hour repeat.  I have also ordered a CTA of the chest to rule out PE.  If this is negative and the hemoglobin is stable, given that the patient has now been asymptomatic for almost 12 hours, I anticipate that he may be able to be discharged back to his facility.  Patient's presentation is most consistent with acute presentation with potential threat to life or bodily function.  The patient is on the cardiac monitor to evaluate for evidence of arrhythmia and/or significant heart rate changes.  ----------------------------------------- 11:03 PM on 03/06/2024 -----------------------------------------  Repeat CBC showed no change in the hemoglobin.  CTA chest was negative for PE and showed findings of likely bronchitis.  However, the patient's wife is now here and provides additional history.  He has been having quite dark stools for the last several weeks although he is also on iron.  He has had decreased p.o. intake and appetite, nausea, and several episodes of vomiting, although did not vomit today.  He also has had some intermittent abdominal  discomfort and constipation.  He also now states that he is having active chest pain and wants nitroglycerin.  We will obtain a repeat EKG and give the nitroglycerin.  We did obtain a CT of the abdomen which shows evidence of possible colitis which could play in the patient's symptoms.  I have a low suspicion for acute GI bleed at this time given the stable hemoglobin.  However, based on shared decision making with the patient and family, given his decreased p.o. intake and active symptoms, I feel that it is most appropriate to admit him to the hospitalist for further workup and observation.  They strongly would prefer to be admitted.  I consulted Dr. Para March from the hospitalist service; based on our discussion she agrees to evaluate the patient for admission.  FINAL CLINICAL IMPRESSION(S) / ED DIAGNOSES   Final diagnoses:  Hemoptysis  Colitis     Rx / DC Orders   ED Discharge Orders     None        Note:  This document was prepared using Dragon voice recognition software and may include unintentional dictation errors.    Dionne Bucy, MD 03/06/24 [phone]

## 2024-03-07 DIAGNOSIS — K921 Melena: Secondary | ICD-10-CM | POA: Diagnosis not present

## 2024-03-07 DIAGNOSIS — R042 Hemoptysis: Secondary | ICD-10-CM

## 2024-03-07 LAB — HEMOGLOBIN
Hemoglobin: 12.7 g/dL — ABNORMAL LOW (ref 13.0–17.0)
Hemoglobin: 12.3 g/dL — ABNORMAL LOW (ref 13.0–17.0)

## 2024-03-07 MED ORDER — PANTOPRAZOLE SODIUM 20 MG PO TBEC
20.0000 mg | DELAYED_RELEASE_TABLET | Freq: Every day | ORAL | Status: DC
  Administered 2024-03-07 – 2024-03-09 (×3): 20 mg via ORAL
  Filled 2024-03-07 (×3): qty 1

## 2024-03-07 MED ORDER — ACETAMINOPHEN 650 MG RE SUPP
650.0000 mg | Freq: Four times a day (QID) | RECTAL | Status: DC | PRN
Start: 2024-03-07 — End: 2024-03-09

## 2024-03-07 MED ORDER — TRAZODONE HCL 50 MG PO TABS
50.0000 mg | ORAL_TABLET | Freq: Every day | ORAL | Status: DC
  Administered 2024-03-07 – 2024-03-08 (×2): 50 mg via ORAL
  Filled 2024-03-07 (×2): qty 1

## 2024-03-07 MED ORDER — ONDANSETRON HCL 4 MG/2ML IJ SOLN: 4.0000 mg | Freq: Four times a day (QID) | INTRAMUSCULAR | Status: DC | PRN

## 2024-03-07 MED ORDER — AMIODARONE HCL 200 MG PO TABS
200.0000 mg | ORAL_TABLET | Freq: Every day | ORAL | Status: DC
  Administered 2024-03-07 – 2024-03-09 (×3): 200 mg via ORAL
  Filled 2024-03-07 (×3): qty 1

## 2024-03-07 MED ORDER — HYDROCODONE-ACETAMINOPHEN 5-325 MG PO TABS: 1.0000 | ORAL_TABLET | Freq: Four times a day (QID) | ORAL | Status: DC | PRN

## 2024-03-07 MED ORDER — FINASTERIDE 5 MG PO TABS
5.0000 mg | ORAL_TABLET | Freq: Every day | ORAL | Status: DC
  Administered 2024-03-07 – 2024-03-08 (×2): 5 mg via ORAL
  Filled 2024-03-07 (×2): qty 1

## 2024-03-07 MED ORDER — GABAPENTIN 300 MG PO CAPS
300.0000 mg | ORAL_CAPSULE | Freq: Two times a day (BID) | ORAL | Status: DC
  Administered 2024-03-07 – 2024-03-09 (×5): 300 mg via ORAL
  Filled 2024-03-07 (×5): qty 1

## 2024-03-07 MED ORDER — RANOLAZINE ER 500 MG PO TB12
500.0000 mg | ORAL_TABLET | Freq: Two times a day (BID) | ORAL | Status: DC
  Administered 2024-03-07 – 2024-03-09 (×5): 500 mg via ORAL
  Filled 2024-03-07 (×5): qty 1

## 2024-03-07 MED ORDER — DULOXETINE HCL 20 MG PO CPEP
20.0000 mg | ORAL_CAPSULE | Freq: Every day | ORAL | Status: DC
  Administered 2024-03-07 – 2024-03-09 (×3): 20 mg via ORAL
  Filled 2024-03-07 (×3): qty 1

## 2024-03-07 MED ORDER — APIXABAN 5 MG PO TABS
5.0000 mg | ORAL_TABLET | Freq: Two times a day (BID) | ORAL | Status: DC
  Administered 2024-03-07 – 2024-03-09 (×5): 5 mg via ORAL
  Filled 2024-03-07 (×5): qty 1

## 2024-03-07 MED ORDER — ATORVASTATIN CALCIUM 80 MG PO TABS
80.0000 mg | ORAL_TABLET | Freq: Every day | ORAL | Status: DC
  Administered 2024-03-07 – 2024-03-09 (×3): 80 mg via ORAL
  Filled 2024-03-07 (×2): qty 1
  Filled 2024-03-07: qty 4

## 2024-03-07 MED ORDER — ONDANSETRON HCL 4 MG PO TABS: 4.0000 mg | ORAL_TABLET | Freq: Four times a day (QID) | ORAL | Status: DC | PRN

## 2024-03-07 MED ORDER — TORSEMIDE 20 MG PO TABS
20.0000 mg | ORAL_TABLET | Freq: Every day | ORAL | Status: DC
  Administered 2024-03-08 – 2024-03-09 (×2): 20 mg via ORAL
  Filled 2024-03-07 (×3): qty 1

## 2024-03-07 MED ORDER — CARVEDILOL 3.125 MG PO TABS
3.1250 mg | ORAL_TABLET | Freq: Two times a day (BID) | ORAL | Status: DC
  Administered 2024-03-07 – 2024-03-09 (×3): 3.125 mg via ORAL
  Filled 2024-03-07 (×3): qty 1

## 2024-03-07 MED ORDER — ISOSORBIDE MONONITRATE ER 30 MG PO TB24
90.0000 mg | ORAL_TABLET | Freq: Every day | ORAL | Status: DC
  Administered 2024-03-07 – 2024-03-09 (×3): 90 mg via ORAL
  Filled 2024-03-07: qty 2
  Filled 2024-03-07 (×2): qty 3

## 2024-03-07 MED ORDER — DOCUSATE SODIUM 100 MG PO CAPS
100.0000 mg | ORAL_CAPSULE | Freq: Two times a day (BID) | ORAL | Status: DC
  Administered 2024-03-07 – 2024-03-09 (×4): 100 mg via ORAL
  Filled 2024-03-07 (×5): qty 1

## 2024-03-07 MED ORDER — FERROUS SULFATE 325 (65 FE) MG PO TABS
325.0000 mg | ORAL_TABLET | Freq: Every day | ORAL | Status: DC
  Administered 2024-03-08 – 2024-03-09 (×2): 325 mg via ORAL
  Filled 2024-03-07 (×3): qty 1

## 2024-03-07 MED ORDER — ACETAMINOPHEN 325 MG PO TABS
650.0000 mg | ORAL_TABLET | Freq: Four times a day (QID) | ORAL | Status: DC | PRN
  Administered 2024-03-08: 650 mg via ORAL
  Filled 2024-03-07: qty 2

## 2024-03-07 NOTE — Progress Notes (Signed)
 Courtesy note No billing-  Patient is seen and examined today morning.  He is lying comfortably.  Denies any active hemoptysis or hematemesis.  Patient is admitted for further management evaluation of bloody sputum.  CTA showed possible bronchitis.  He does not have any respiratory symptoms.  Hemoglobin remained stable.  I restarted his Eliquis therapy.  Told him that if his hemoglobin remained stable he can be discharged home.  He understands to follow-up with his primary care upon discharge.  PT OT evaluation for discharge planning.

## 2024-03-07 NOTE — H&P (Signed)
 History and Physical    Patient: Gregory Shepherd MVH:[id number] DOB: [birth year] DOA: 03/06/2024 DOS: the patient was seen and examined on 03/07/2024 PCP: Lauro Regulus, MD  Patient coming from: SNF  Chief Complaint:  Chief Complaint  Patient presents with   Hematemesis    HPI: Gregory Shepherd is a 78 y.o. male with medical history significant for hypertension, hyperlipidemia, PVD, CAD with CABG,sCHF, hypothyroidism, depression, BPH, anemia, A-fib on Eliquis recently hospitalized from 3/25 to 3/28 for repair of left femoral neck fracture discharged to SNF with Foley due to acute urinary retention, was sent from the facility for concern for hemoptysis versus hematemesis.  Patient apparently awoke on the morning of 4/1 at 6:30 AM with blood-streaked phlegm in his mouth.  He was otherwise at his baseline and reported no cough or shortness of breath.  He had a reassuring workup and was going to be discharged back to the facility however when his wife appeared said the ED she reported that he was also having decreased oral intake, several episodes of vomiting that resolved day prior to coming in and he had been complaining of abdominal pain and constipation as well as chest pain. ED course and data review: Vitals unremarkable, baseline bradycardia in the 50s and 60s. Labs notable for hemoglobin 11.7, mild hyponatremia.  CBC, CMP, lipase and UA for the most part unremarkable. EKG, personally viewed and interpreted showing sinus at 59 with RBBB and no acute ST-T wave changes CTA PE protocol negative for PE showing findings compatible with bronchitis CT abdomen and pelvis without contrast showing a myriad of findings with concern for stercoral proctitis among other nonacute appearing findings. Patient was treated in the ED with NS, nitroglycerin Hospitalist consulted for admission.     Past Medical History:  Diagnosis Date   Carotid artery narrowing    History of blood clots    History of  colon polyps    Hyperlipidemia    Hypertension    MI (myocardial infarction) [employer])    Past Surgical History:  Procedure Laterality Date   ABDOMINAL AORTOGRAM W/LOWER EXTREMITY N/A 06/04/2019   Procedure: ABDOMINAL AORTOGRAM W/LOWER EXTREMITY;  Surgeon: Runell Gess, MD;  Location: [facility] INVASIVE CV [facility];  Service: Cardiovascular;  Laterality: N/A;   BIOPSY  02/08/2022   Procedure: BIOPSY;  Surgeon: Tressia Danas, MD;  Location: [facility] ENDOSCOPY;  Service: Gastroenterology;;   CARDIAC SURGERY     x2    CARDIOVERSION N/A 08/25/2021   Procedure: CARDIOVERSION;  Surgeon: Chilton Si, MD;  Location: [facility] ENDOSCOPY;  Service: Cardiovascular;  Laterality: N/A;   CARDIOVERSION N/A 11/23/2021   Procedure: CARDIOVERSION;  Surgeon: Vesta Mixer, MD;  Location: [facility] ENDOSCOPY;  Service: Cardiovascular;  Laterality: N/A;   CARDIOVERSION N/A 02/17/2024   Procedure: CARDIOVERSION;  Surgeon: [name], MD;  Location: [facility] INVASIVE CV LAB;  Service: Cardiovascular;  Laterality: N/A;   COLONOSCOPY     Age 73. [facility]   COLONOSCOPY WITH PROPOFOL N/A 02/08/2022   Procedure: COLONOSCOPY WITH PROPOFOL;  Surgeon: Tressia Danas, MD;  Location: [facility] ENDOSCOPY;  Service: Gastroenterology;  Laterality: N/A;   CORONARY ARTERY BYPASS GRAFT  1990   x 2 at [facility], redo x 3 @ [facility] approx 1998   ESOPHAGOGASTRODUODENOSCOPY (EGD) WITH PROPOFOL N/A 02/08/2022   Procedure: ESOPHAGOGASTRODUODENOSCOPY (EGD) WITH PROPOFOL;  Surgeon: Tressia Danas, MD;  Location: [facility] ENDOSCOPY;  Service: Gastroenterology;  Laterality: N/A;   LEFT HEART CATH AND CORS/GRAFTS ANGIOGRAPHY N/A 02/06/2024   Procedure: LEFT HEART  CATH AND CORS/GRAFTS ANGIOGRAPHY;  Surgeon: Yvonne Kendall, MD;  Location: [facility] INVASIVE CV LAB;  Service: Cardiovascular;  Laterality: N/A;   PERCUTANEOUS PINNING Left 02/28/2024   Procedure: PINNING, EXTREMITY, PERCUTANEOUS;  Surgeon: Reinaldo Berber, MD;  Location: [facility] ORS;  Service:  Orthopedics;  Laterality: Left;   PERIPHERAL VASCULAR ATHERECTOMY Right 06/04/2019   Procedure: PERIPHERAL VASCULAR ATHERECTOMY;  Surgeon: Runell Gess, MD;  Location: [facility] INVASIVE CV LAB;  Service: Cardiovascular;  Laterality: Right;  common iliac   PERIPHERAL VASCULAR INTERVENTION Right 06/04/2019   Procedure: PERIPHERAL VASCULAR INTERVENTION;  Surgeon: Runell Gess, MD;  Location: [facility]VAS[facility] CV LAB;  Service: Cardiovascular;  Laterality: Right;  common iliac   POLYPECTOMY  02/08/2022   Procedure: POLYPECTOMY;  Surgeon: Tressia Danas, MD;  Location: [facility] ENDOSCOPY;  Service: Gastroenterology;;   TEE WITHOUT CARDIOVERSION N/A 08/25/2021   Procedure: TRANSESOPHAGEAL ECHOCARDIOGRAM (TEE);  Surgeon: Chilton Si, MD;  Location: [facility] ENDOSCOPY;  Service: Cardiovascular;  Laterality: N/A;   TEE WITHOUT CARDIOVERSION N/A 11/23/2021   Procedure: TRANSESOPHAGEAL ECHOCARDIOGRAM (TEE);  Surgeon: Elease Hashimoto Deloris Ping, MD;  Location: [facility] ENDOSCOPY;  Service: Cardiovascular;  Laterality: N/A;   Social History:  reports that he has been smoking cigarettes. He has never used smokeless tobacco. He reports that he does not drink alcohol and does not use drugs.  Allergies  Allergen Reactions   Omeprazole     doesn't work well with eliquis    Other Palpitations    Certain steroids give patient heart palpitations   Allergy to green peppers causes upset stomach     Family History  Problem Relation Age of Onset   Heart disease Mother    Heart disease Father    Diabetes Sister    Prostate cancer Neg Hx    Bladder Cancer Neg Hx    Kidney cancer Neg Hx    Colon cancer Neg Hx    Esophageal cancer Neg Hx     Prior to Admission medications   Medication Sig Start Date End Date Taking? Authorizing Provider  acetaminophen (TYLENOL) 325 MG tablet Take 2 tablets (650 mg total) by mouth every 6 (six) hours as needed for mild pain (pain score 1-3) or fever. 02/29/24   Evon Slack, PA-C  amiodarone  (PACERONE) 200 MG tablet Take 200 mg by mouth daily.    [provider]  atorvastatin (LIPITOR) 80 MG tablet Take 80 mg by mouth daily.    [provider]  carvedilol (COREG) 3.125 MG tablet TAKE ONE TABLET BY MOUTH TWICE A DAY 01/25/24   Graciella Freer, PA-C  Cholecalciferol (VITAMIN D) 50 MCG (2000 UT) CAPS Take 4,000 Units by mouth daily.    [provider]  docusate sodium (COLACE) 100 MG capsule Take 1 capsule (100 mg total) by mouth 2 (two) times daily. 02/29/24   Evon Slack, PA-C  DULoxetine (CYMBALTA) 20 MG capsule Take 1 capsule (20 mg total) by mouth daily. 08/31/23   Judi Saa, DO  ELIQUIS 5 MG TABS tablet TAKE ONE TABLET BY MOUTH TWICE A DAY 01/03/24   Runell Gess, MD  ferrous sulfate 325 (65 FE) MG tablet Take 325 mg by mouth daily.    [provider]  finasteride (PROSCAR) 5 MG tablet Take 1 tablet (5 mg total) by mouth daily. Patient taking differently: Take 5 mg by mouth at bedtime. 12/29/18   Stoioff, Verna Czech, MD  gabapentin (NEURONTIN) 600 MG tablet Take 300 mg by mouth 2 (two) times daily. 12/16/20  [provider]  HYDROcodone-acetaminophen (NORCO/VICODIN) 5-325 MG tablet Take 1 tablet by mouth every 6 (six) hours as needed for moderate pain (pain score 4-6). 03/01/24   Evon Slack, PA-C  isosorbide mononitrate (IMDUR) 60 MG 24 hr tablet Take 1.5 tablets (90 mg total) by mouth daily. 02/10/24   Runell Gess, MD  lansoprazole (PREVACID) 30 MG capsule TAKE ONE CAPSULE BY MOUTH DAILY 02/28/24   Arnaldo Natal, NP  Multiple Vitamin (MULTIVITAMIN) tablet Take 1 tablet by mouth daily.    [provider]  nitroGLYCERIN (NITROSTAT) 0.4 MG SL tablet PLACE ONE TABLET UNDER THE TONGUE AS NEEDED FOR CHEST PAIN 01/27/24   Runell Gess, MD  potassium chloride (KLOR-CON) 10 MEQ tablet TAKE TWO TABLETS BY MOUTH TWICE A DAY Patient taking differently: Take 10 mEq by mouth daily. 08/03/23   Runell Gess, MD  pyridOXINE (VITAMIN B6) 100 MG tablet Take 100 mg by mouth 2 (two) times daily.    [provider]  ranolazine (RANEXA) 500 MG 12 hr tablet Take 1 tablet (500 mg total) by mouth 2 (two) times daily. 02/06/24   End, Cristal Deer, MD  tamsulosin (FLOMAX) 0.4 MG CAPS capsule Take 1 capsule (0.4 mg total) by mouth daily. 03/02/24   Evon Slack, PA-C  torsemide (DEMADEX) 20 MG tablet Take 1 tablet (20 mg total) by mouth 2 (two) times daily. Patient taking differently: Take 20 mg by mouth daily. 01/05/23   Graciella Freer, PA-C  traZODone (DESYREL) 50 MG tablet Take 50 mg by mouth at bedtime. 06/10/22   [provider]  vitamin B-12 (CYANOCOBALAMIN) 1000 MCG tablet Take 1,000 mcg by mouth daily.    [provider]  vitamin C (ASCORBIC ACID) 500 MG tablet Take 500 mg by mouth 2 (two) times daily.    [provider]    Physical Exam: Vitals:   03/07/24 0000 03/07/24 0100 03/07/24 0200 03/07/24 0214  BP: (!) 147/61 (!) 159/81 (!) 156/56   Pulse: (!) 58 (!) 55 (!) 57 63  Resp: 16 15 16 17   Temp:      TempSrc:      SpO2: 99% 98% 97% 97%  Weight:      Height:       Physical Exam Vitals and nursing note reviewed.  Constitutional:      General: He is not in acute distress. HENT:     Head: Normocephalic and atraumatic.  Cardiovascular:     Rate and Rhythm: Normal rate and regular rhythm.     Heart sounds: Normal heart sounds.  Pulmonary:     Effort: Pulmonary effort is normal.     Breath sounds: Normal breath sounds.  Abdominal:     Palpations: Abdomen is soft.     Tenderness: There is no abdominal tenderness.  Neurological:     Mental Status: Mental status is at baseline.     Labs on Admission: I have personally reviewed following labs and imaging studies  CBC: Recent Labs  Lab 02/29/24 0854 03/06/24 1201 03/06/24 1747  WBC 11.8* 6.9 7.7  HGB 13.0 11.5* 11.7*  HCT 36.3* 32.5* 33.7*  MCV 89.4 90.5 90.1  PLT 245 241 264    Basic Metabolic Panel: Recent Labs  Lab 02/29/24 0352 03/01/24 0331 03/06/24 1201  NA 131* 132* 131*  K 4.0 3.5 4.3  CL 102 101 102  CO2 21* 25 24  GLUCOSE 101* 90 115*  BUN 17 18 17   CREATININE 0.89 0.94 1.03  CALCIUM 8.3* 7.9* 8.1*   GFR: Estimated Creatinine Clearance: 62.5 mL/min (by C-G formula based on SCr of 1.03 mg/dL). Liver Function Tests: Recent Labs  Lab 03/06/24 1201  AST 30  ALT 41  ALKPHOS 101  BILITOT 1.1  PROT 5.3*  ALBUMIN 2.2*   Recent Labs  Lab 03/06/24 1201  LIPASE 27   No results for input(s): "AMMONIA" in the last 168 hours. Coagulation Profile: No results for input(s): "INR", "PROTIME" in the last 168 hours. Cardiac Enzymes: No results for input(s): "CKTOTAL", "CKMB", "CKMBINDEX", "TROPONINI" in the last 168 hours. BNP (last 3 results) No results for input(s): "PROBNP" in the last 8760 hours. HbA1C: No results for input(s): "HGBA1C" in the last 72 hours. CBG: No results for input(s): "GLUCAP" in the last 168 hours. Lipid Profile: No results for input(s): "CHOL", "HDL", "LDLCALC", "TRIG", "CHOLHDL", "LDLDIRECT" in the last 72 hours. Thyroid Function Tests: No results for input(s): "TSH", "T4TOTAL", "FREET4", "T3FREE", "THYROIDAB" in the last 72 hours. Anemia Panel: No results for input(s): "VITAMINB12", "FOLATE", "FERRITIN", "TIBC", "IRON", "RETICCTPCT" in the last 72 hours. Urine analysis:    Component Value Date/Time   COLORURINE AMBER (A) 03/06/2024 1747   APPEARANCEUR HAZY (A) 03/06/2024 1747   LABSPEC 1.015 03/06/2024 1747   PHURINE 5.0 03/06/2024 1747   GLUCOSEU NEGATIVE 03/06/2024 1747   HGBUR NEGATIVE 03/06/2024 1747   BILIRUBINUR NEGATIVE 03/06/2024 1747   KETONESUR NEGATIVE 03/06/2024 1747   PROTEINUR NEGATIVE 03/06/2024 1747   NITRITE NEGATIVE 03/06/2024 1747   LEUKOCYTESUR NEGATIVE 03/06/2024 1747    Radiological Exams on Admission: CT ABDOMEN PELVIS WO CONTRAST Result Date: 03/06/2024 CLINICAL DATA:  Abdominal  pain, acute, nonlocalized. Patient reports spitting up blood onset 5:30 a.m. today. Left femoral surgery performed 8 days ago. EXAM: CT ABDOMEN AND PELVIS WITHOUT CONTRAST TECHNIQUE: Multidetector CT imaging of the abdomen and pelvis was performed following the standard protocol without IV contrast. RADIATION DOSE REDUCTION: This exam was performed according to the departmental dose-optimization program which includes automated exposure control, adjustment of the mA and/or kV according to patient size and/or use of iterative reconstruction technique. COMPARISON:  CTA abdomen pelvis 01/24/2024, and chest CT without contrast partially including the abdomen 09/20/2022. the patient had CTA chest earlier today. FINDINGS: Lower chest: Bilateral minimal pleural effusions are again noted. There is bronchial thickening in the lower lobes but the lung bases are clear of infiltrates. There is mild cardiomegaly, with three-vessel coronary calcifications and CABG changes noted on the CTA chest today. There is no pericardial effusion. Hepatobiliary: Single 8 mm stone in the proximal gallbladder again noted. No thickening or biliary dilatation. The unremarkable unenhanced liver parenchyma. Pancreas: No abnormality. Spleen: No abnormality. Adrenals/Urinary Tract: There is no adrenal mass. 1.7 cm Bosniak 1 cyst in the upper pole right kidney again measures 8.3 Hounsfield units, 1.7 cm. The there is a 1 cm Bosniak 1 cyst in the posterior left kidney measuring 1 cm, 7 Hounsfield units. No follow-up imaging is recommended. There is no contour deforming mass of either kidney or other focal abnormality. There is contrast in the collecting systems. There is no hydronephrosis or ureteral stone. There is contrast in the bladder, which is catheterized and partially empty. Bladder is unremarkable for the degree of distention but there again is a prominent impression on the bladder base by the enlarged prostate. Stomach/Bowel: The gastric wall  and unopacified small bowel are unremarkable. The normal appendix is well seen. There is either mild wall thickening or changes of underdistention in the ascending  colon. Correlate clinically for underlying colitis. There is advanced diverticulosis in the descending and sigmoid colon without findings of diverticulitis. There is moderate retained stool in the rectum. There is slight rectal thickening and adjacent stranding, in this case unclear if this is congestive in etiology or due to stercoral proctitis given the additional finding of mild body wall edema. Vascular/Lymphatic: There is heavy aortic calcific plaque, visceral branch vessel atherosclerosis also noted but no AAA. No adenopathy is seen. Reproductive: Enlarged prostate impressing into the bladder base, 6.3 cm transverse, unchanged since 01/24/2024. There are dystrophic calcifications posteriorly. Both testicles are in the scrotal sac. Other: There is mild body wall edema in the flanks and upper thighs not seen previously, trace posterior deep pelvic ascites. There is no free hemorrhage, free air or localizing collection. Musculoskeletal: There is dextroscoliosis, osteopenia and degenerative change of the spine. There are 3 threaded hip pins newly seen in the proximal left femur. Mild swelling noted in the anterior compartment upper left thigh musculature, probably postsurgical in etiology. Correlate clinically for myositis. There is acquired spinal stenosis L3-4 and L4-5. Multilevel foraminal stenosis lumbar spine. Multilevel subacute left rib fractures partially visible, described in detail in today's earlier report. IMPRESSION: 1. Mild body wall edema in the flanks and upper thighs, trace posterior deep pelvic ascites. 2. Moderate retained stool in the rectum with slight rectal thickening and adjacent stranding, in this case unclear if this is congestive in etiology or due to stercoral proctitis given the mild body wall edema. 3. Mild wall thickening  or changes of underdistention in the ascending colon. Correlate clinically for underlying colitis. 4. Diverticulosis without evidence of diverticulitis. 5. Cholelithiasis. 6. Aortic and branch vessel atherosclerosis, heaviest in the aorta and common iliac arteries. 7. Enlarged prostate impressing into the bladder base. 8. Mild swelling in the anterior compartment upper left thigh musculature, probably postsurgical in etiology. Correlate clinically for myositis. 9. Osteopenia, scoliosis and degenerative change. 10. Multilevel subacute left rib fractures partially visible, described in detail in today's earlier report. 11. Cardiomegaly with minimal pleural effusions. Post CABG with calcific CAD. Electronically Signed   By: Almira Bar M.D.   On: 03/06/2024 22:30   CT Angio Chest PE W and/or Wo Contrast Result Date: 03/06/2024 CLINICAL DATA:  Hemoptysis since this morning, recent left femur surgery, anticoagulated EXAM: CT ANGIOGRAPHY CHEST WITH CONTRAST TECHNIQUE: Multidetector CT imaging of the chest was performed using the standard protocol during bolus administration of intravenous contrast. Multiplanar CT image reconstructions and MIPs were obtained to evaluate the vascular anatomy. RADIATION DOSE REDUCTION: This exam was performed according to the departmental dose-optimization program which includes automated exposure control, adjustment of the mA and/or kV according to patient size and/or use of iterative reconstruction technique. CONTRAST:  75mL OMNIPAQUE IOHEXOL 350 MG/ML SOLN COMPARISON:  09/20/2022, 02/20/2024 FINDINGS: Cardiovascular: This is a technically adequate evaluation of the pulmonary vasculature. No filling defects or pulmonary emboli. The heart is enlarged without pericardial effusion. Dilated aortic root measuring up to 3.8 cm without frank aneurysm or evidence of dissection. Stable aortic valve calcification and diffuse atherosclerosis of the aorta and native coronary vasculature.  Postsurgical changes from prior CABG. Mediastinum/Nodes: No enlarged mediastinal, hilar, or axillary lymph nodes. Thyroid gland, trachea, and esophagus demonstrate no significant findings. Lungs/Pleura: Trace bilateral pleural effusions and dependent lower lobe atelectasis, right greater than left. No airspace disease or pneumothorax. Mild upper lobe predominant emphysema. There is bilateral bronchial wall thickening, greatest within the lower lobes, consistent with bronchitis or reactive airway  disease. Upper Abdomen: No acute abnormality. Musculoskeletal: There are acute to subacute appearing left anterolateral third through seventh rib fractures, without callus formation. Older subacute fractures are seen involving the left posterior fifth through ninth ribs at the costovertebral junctions, with moderate callus formation. Reconstructed images demonstrate no additional findings. Review of the MIP images confirms the above findings. IMPRESSION: 1. No evidence of pulmonary embolus. 2. Trace bilateral pleural effusions and minimal dependent lower lobe atelectasis. 3. Bilateral bronchial wall thickening compatible with bronchitis or reactive airway disease. 4. Multiple left rib fractures of varying ages, with more acute fractures involving the left anterolateral third through seventh ribs and more subacute fractures involving the left posterior fifth through ninth ribs. 5. Aortic Atherosclerosis (ICD10-I70.0) and Emphysema (ICD10-J43.9). Electronically Signed   By: Sharlet Salina M.D.   On: 03/06/2024 19:05     Data Reviewed: Relevant notes from primary care and specialist visits, past discharge summaries as available in EHR, including Care Everywhere. Prior diagnostic testing as pertinent to current admission diagnoses Updated medications and problem lists for reconciliation ED course, including vitals, labs, imaging, treatment and response to treatment Triage notes, nursing and pharmacy notes and ED  provider's notes Notable results as noted in HPI   Assessment and Plan:   Blood-streaked sputum,?  Hemoptysis Dark stools, doubtful melena -Workup unrevealing-negative CTA except for bronchitis, possible proctitis - Monitor for respiratory symptoms - Serial H&H - Daily MiraLAX -Antiemetics, Protonix -Hold Eliquis until stable   Chronic combined systolic and diastolic CHF (congestive heart failure) ( Clinically euvolemic -Continue home torsemide 20 mg daily   S/p repair of Fracture of femoral neck, left, closed (HCC):  -No acute issues - Continue PT OT  CAD (coronary artery disease): No chest pain -Lipitor, Imdur, Ranexa, as needed nitroglycerin   Atrial fibrillation, persistent (HCC): Heart rate 56 -Coreg -Holding tonight's dose of Eliquis until show that hemoglobin is stable   Essential (primary) hypertension -IV hydralazine as needed -Coreg, Imdur, torsemide   Chronic hyponatremia Continue to monitor   Dyslipidemia, goal LDL below 70 -Lipitor   BPH with obstruction/lower urinary tract symptoms -Proscar  h   Depression -Continue home medications     DVT prophylaxis: eliquis  Consults: none  Advance Care Planning:   Code Status: Prior   Family Communication: none  Disposition Plan: Back to previous home environment  Severity of Illness: The appropriate patient status for this patient is OBSERVATION. Observation status is judged to be reasonable and necessary in order to provide the required intensity of service to ensure the patient's safety. The patient's presenting symptoms, physical exam findings, and initial radiographic and laboratory data in the context of their medical condition is felt to place them at decreased risk for further clinical deterioration. Furthermore, it is anticipated that the patient will be medically stable for discharge from the hospital within 2 midnights of admission.   Author: Andris Baumann, MD 03/07/2024 3:22 AM  For on  call review [email or web address].

## 2024-03-07 NOTE — Evaluation (Signed)
 Physical Therapy Evaluation Patient Details Name: Gregory Shepherd MRN: [id number] DOB: 21-Oct-1946 Today's Date: 03/07/2024  History of Present Illness  Gregory Shepherd is a 77yoM who comes to [facility] ED from STR due to concerns for hematemesis v hemoptysis. Pt DC from here to STR just a week ago s/p L hip closed reduction percutaneous pinning on 3/25. Pt presented to ED after 4 falls on 3/16 resulting in L femoral neck fx/multiple abrasions to elbows, but left before results given. PMH significant for PMH of CAD/CM/CABG/EF 35%, A. Fib, cardioversion on 02/17/24, DM 2, HTN, PVD, L carpal tunnel, L ulnar neuropathy, HLD, depression, anemia. Pt had hypotensive presyncope on POD1 and lesser degree on POD2.  Clinical Impression  Pt in bed trying to rest, agreeable to evaluation- pt familiar to author from prior admission. Pt denies any notable changes to strength, mobility, and pain, both generally and specific to his hip ORIF. Pt reports having only worked with PT once since being there, but is anxious to return and get more therapy. Pt demonstrates exercises of LLE with better tolerance today that when here last week. Pt assisted to scooting up in bed as his heels are off edge off mattress, however he declines other mobility at this time as he is exhausted from not sleeping last night and believe he will be changing room soon. Anticipate smooth transition back to SNF once medical workup is completed here. Will continue to follow while admitted.        If plan is discharge home, recommend the following: A lot of help with bathing/dressing/bathroom;A lot of help with walking and/or transfers;Assist for transportation;Help with stairs or ramp for entrance   Can travel by private vehicle   No    Equipment Recommendations None recommended by PT  Recommendations for Other Services       Functional Status Assessment Patient has had a recent decline in their functional status and demonstrates the ability to  make significant improvements in function in a reasonable and predictable amount of time.     Precautions / Restrictions Precautions Precautions: None Restrictions LLE Weight Bearing Per Provider Order: Weight bearing as tolerated      Mobility  Bed Mobility Overal bed mobility:  (requires modA for pulling up in bed, cues for RLE use.)                  Transfers Overall transfer level:  (pt declines due to fatigue from being sleepless in ED here.)                      Ambulation/Gait                  Stairs            Wheelchair Mobility     Tilt Bed    Modified Rankin (Stroke Patients Only)       Balance                                             Pertinent Vitals/Pain Pain Assessment Pain Assessment: Faces Faces Pain Scale: Hurts little more Pain Location: bilat hips Pain Intervention(s): Limited activity within patient's tolerance, Monitored during session    Home Living Family/patient expects to be discharged to:: Skilled nursing facility Living Arrangements: Spouse/significant other Available Help at Discharge: Available PRN/intermittently;Family Type of Home: House Home Access:  Stairs to enter   [employer] of Steps: 2 (back of house, son takes pt up in wc), 8 in garage   Home Layout: Able to live on main level with bedroom/bathroom;Two level Home Equipment: Rollator (4 wheels);Shower seat;Wheelchair - manual;Grab bars - toilet Additional Comments: wc is wide; spouse is trying to get him new wc    Prior Function Prior Level of Function : History of Falls (last six months);Needs assist             Mobility Comments: frequent falls, pt reports balance and BLE weakness (says he falls doing "silly things" like picking up items from floor and cannot get up without assist). uses [employer] household distances, wc for MD appt transportation and community (son takes pt up stairs in wc) ADLs Comments:  spouse helps with dressing, bathing and toileting     Extremity/Trunk Assessment   Upper Extremity Assessment LUE Deficits / Details: hx of L carpal tunnel/L hand weakness, decreased FMC and GMC noted with poor bilateral coordination. Can assist minimally with placement on RW            Communication        Cognition Arousal: Alert Behavior During Therapy: [facility] for tasks assessed/performed   PT - Cognitive impairments: No apparent impairments                                 Cueing       General Comments      Exercises General Exercises - Lower Extremity Short Arc Quad: AROM, Left, 10 reps, Supine   Assessment/Plan    PT Assessment Patient needs continued PT services  PT Problem List Decreased strength;Decreased activity tolerance;Decreased balance;Decreased mobility;Decreased knowledge of use of DME;Decreased safety awareness;Pain       PT Treatment Interventions DME instruction;Gait training;Stair training;Functional mobility training;Therapeutic activities;Therapeutic exercise;Balance training;Neuromuscular re-education    PT Goals (Current goals can be found in the Care Plan section)  Acute Rehab PT Goals Patient Stated Goal: return to STR adn begin intense rehab services PT Goal Formulation: With patient Time For Goal Achievement: 03/21/24 Potential to Achieve Goals: Good    Frequency Min 3X/week     Co-evaluation               AM-PAC PT "6 Clicks" Mobility  Outcome Measure Help needed turning from your back to your side while in a flat bed without using bedrails?: A Lot Help needed moving from lying on your back to sitting on the side of a flat bed without using bedrails?: A Lot Help needed moving to and from a bed to a chair (including a wheelchair)?: A Lot Help needed standing up from a chair using your arms (e.g., wheelchair or bedside chair)?: A Lot Help needed to walk in hospital room?: A Lot Help needed climbing 3-5 steps with  a railing? : A Lot 6 Click Score: 12    End of Session   Activity Tolerance: Patient tolerated treatment well;Patient limited by fatigue Patient left: in bed;with call bell/phone within reach;with bed alarm set Nurse Communication: Mobility status PT Visit Diagnosis: Unsteadiness on feet (R26.81);Other abnormalities of gait and mobility (R26.89);Muscle weakness (generalized) (M62.81);History of falling (Z91.81);Dizziness and giddiness (R42)    Time: 1610-9604 PT Time Calculation (min) (ACUTE ONLY): 15 min   Charges:   PT Evaluation $PT Eval Moderate Complexity: 1 Mod   PT General Charges $$ ACUTE PT VISIT: 1 Visit  1:38 PM, 03/07/24 Rosamaria Lints, PT, DPT Physical Therapist - [facility]  [phone] (ASCOM)    Rylan Kaufmann C 03/07/2024, 1:33 PM

## 2024-03-08 DIAGNOSIS — E871 Hypo-osmolality and hyponatremia: Secondary | ICD-10-CM

## 2024-03-08 DIAGNOSIS — I480 Paroxysmal atrial fibrillation: Secondary | ICD-10-CM

## 2024-03-08 DIAGNOSIS — I5042 Chronic combined systolic (congestive) and diastolic (congestive) heart failure: Secondary | ICD-10-CM

## 2024-03-08 DIAGNOSIS — Z951 Presence of aortocoronary bypass graft: Secondary | ICD-10-CM

## 2024-03-08 DIAGNOSIS — K921 Melena: Secondary | ICD-10-CM | POA: Diagnosis not present

## 2024-03-08 DIAGNOSIS — R042 Hemoptysis: Secondary | ICD-10-CM | POA: Diagnosis not present

## 2024-03-08 LAB — BASIC METABOLIC PANEL WITH GFR
Anion gap: 6 (ref 5–15)
BUN: 12 mg/dL (ref 8–23)
CO2: 22 mmol/L (ref 22–32)
Calcium: 7.8 mg/dL — ABNORMAL LOW (ref 8.9–10.3)
Chloride: 102 mmol/L (ref 98–111)
Creatinine, Ser: 0.76 mg/dL (ref 0.61–1.24)
GFR, Estimated: >60 mL/min (ref >60)
Glucose, Bld: 90 mg/dL (ref 70–99)
Potassium: 3.2 mmol/L — ABNORMAL LOW (ref 3.5–5.1)
Sodium: 130 mmol/L — ABNORMAL LOW (ref 135–145)

## 2024-03-08 LAB — HEMOGLOBIN AND HEMATOCRIT, BLOOD
HCT: 31.9 % — ABNORMAL LOW (ref 39.0–52.0)
Hemoglobin: 11.3 g/dL — ABNORMAL LOW (ref 13.0–17.0)

## 2024-03-08 MED ORDER — CHLORHEXIDINE GLUCONATE CLOTH 2 % EX PADS
6.0000 | MEDICATED_PAD | Freq: Every day | CUTANEOUS | Status: DC
  Administered 2024-03-08: 6 via TOPICAL

## 2024-03-08 NOTE — [place] FL2 (Signed)
 [place] MEDICAID FL2 LEVEL OF CARE FORM     IDENTIFICATION  Patient Name: Gregory Shepherd Birthdate: [birth year] Sex: male Admission Date (Current Location): 03/06/2024  [facility] and [place] Number:  Chiropodist and Address:  [facility], [address] [place], [place] [address]      Provider Number: [id number]  Attending Physician Name and Address:  Marcelino Duster, MD  Relative Name and Phone Number:  SALIF TAY  [facility]  Emergency Contact  [phone]  [email or web address]  [address] [place] [address]    Current Level of Care: Hospital Recommended Level of Care: Skilled Nursing Facility Prior Approval Number:    Date Approved/Denied:   PASRR Number: [id number]  Discharge Plan: SNF    Current Diagnoses: Patient Active Problem List   Diagnosis Date Noted   Hemoptysis 03/06/2024   Malnutrition of moderate degree 03/01/2024   Orthostatic hypotension 02/29/2024   Fracture of femoral neck, left, closed (HCC) 02/28/2024   CAD (coronary artery disease) 02/28/2024   Chronic combined systolic and diastolic CHF (congestive heart failure) (HCC) 02/28/2024   Depression 02/28/2024   Hyponatremia 02/28/2024   Hypothyroidism 12/20/2022   Degenerative disc disease, cervical 10/12/2022   Smoker 08/24/2022   Left carpal tunnel syndrome 07/14/2022   Ulnar neuropathy at elbow, left 07/14/2022   Pronation deformity of ankle, acquired 05/11/2022   Left hand weakness 02/12/2022   Gastritis and gastroduodenitis    Melena    Microcytic anemia    Atrial fibrillation, persistent (HCC)    Shortness of breath 06/19/2021   Hyperlipemia 06/19/2021   Chronic GERD 06/19/2021   Bilateral lower extremity edema 01/14/2021   Claudication in peripheral vascular disease (HCC) 06/04/2019   Dyslipidemia, goal LDL below 70 02/20/2019   Peripheral arterial disease (HCC) 02/03/2019   Diabetes (HCC) 01/04/2018   Nocturia  01/04/2018   Right leg pain 12/13/2017   Health care maintenance 07/17/2015   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   Essential (primary) hypertension 06/30/2014   Polyp of colon 06/30/2014   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   Incomplete emptying of bladder 08/10/2013   Nodular prostate with urinary obstruction 08/10/2013   Prostate nodule 08/10/2013    Orientation RESPIRATION BLADDER Height & Weight     Self, Time, Situation, Place  Normal Incontinent Weight: 74.8 kg Height:  6' (182.9 cm)  BEHAVIORAL SYMPTOMS/MOOD NEUROLOGICAL BOWEL NUTRITION STATUS   (None)  (none) Incontinent Diet (regular fluid res 1200)  AMBULATORY STATUS COMMUNICATION OF NEEDS Skin   Extensive Assist Verbally Surgical wounds, Skin abrasions, Bruising                       Personal Care Assistance Level of Assistance  Bathing, Feeding, Dressing Bathing Assistance: Maximum assistance Feeding assistance: Limited assistance Dressing Assistance: Maximum assistance     Functional Limitations Info  Sight, Hearing, Speech Sight Info: Adequate Hearing Info: Adequate Speech Info: Adequate    SPECIAL CARE FACTORS FREQUENCY  PT (By licensed PT), OT (By licensed OT)     PT Frequency: 5 times per week OT Frequency: 5 times per week            Contractures Contractures Info: Not present    Additional Factors Info  Code Status, Allergies Code Status Info: full code Allergies Info: omeprazole, green pepers           Current Medications (03/08/2024):  This  is the current hospital active medication list Current Facility-Administered Medications  Medication Dose Route Frequency Provider Last Rate Last Admin   acetaminophen (TYLENOL) tablet 650 mg  650 mg Oral Q6H PRN Andris Baumann, MD   650 mg at 03/08/24 1035   Or   acetaminophen (TYLENOL) suppository 650 mg  650 mg Rectal Q6H PRN Andris Baumann, MD        amiodarone (PACERONE) tablet 200 mg  200 mg Oral Daily Lindajo Royal V, MD   200 mg at 03/08/24 1037   apixaban (ELIQUIS) tablet 5 mg  5 mg Oral BID Marcelino Duster, MD   5 mg at 03/08/24 1036   atorvastatin (LIPITOR) tablet 80 mg  80 mg Oral Daily Lindajo Royal V, MD   80 mg at 03/08/24 1037   carvedilol (COREG) tablet 3.125 mg  3.125 mg Oral BID WC Lindajo Royal V, MD   3.125 mg at 03/07/24 0915   Chlorhexidine Gluconate Cloth 2 % PADS 6 each  6 each Topical Daily Marcelino Duster, MD       docusate sodium (COLACE) capsule 100 mg  100 mg Oral BID Lindajo Royal V, MD   100 mg at 03/08/24 1038   DULoxetine (CYMBALTA) DR capsule 20 mg  20 mg Oral Daily Lindajo Royal V, MD   20 mg at 03/08/24 1037   ferrous sulfate tablet 325 mg  325 mg Oral Daily Lindajo Royal V, MD   325 mg at 03/08/24 1038   finasteride (PROSCAR) tablet 5 mg  5 mg Oral QHS Lindajo Royal V, MD   5 mg at 03/07/24 2136   gabapentin (NEURONTIN) capsule 300 mg  300 mg Oral BID Andris Baumann, MD   300 mg at 03/08/24 1037   HYDROcodone-acetaminophen (NORCO/VICODIN) 5-325 MG per tablet 1 tablet  1 tablet Oral Q6H PRN Andris Baumann, MD       isosorbide mononitrate (IMDUR) 24 hr tablet 90 mg  90 mg Oral Daily Lindajo Royal V, MD   90 mg at 03/08/24 1036   nitroGLYCERIN (NITROSTAT) SL tablet 0.4 mg  0.4 mg Sublingual Q5 min PRN Dionne Bucy, MD   0.4 mg at 03/07/24 0932   ondansetron (ZOFRAN) tablet 4 mg  4 mg Oral Q6H PRN Andris Baumann, MD       Or   ondansetron [facility]) injection 4 mg  4 mg Intravenous Q6H PRN Andris Baumann, MD       pantoprazole (PROTONIX) EC tablet 20 mg  20 mg Oral Daily Lindajo Royal V, MD   20 mg at 03/08/24 1038   ranolazine (RANEXA) 12 hr tablet 500 mg  500 mg Oral BID Lindajo Royal V, MD   500 mg at 03/08/24 1037   torsemide (DEMADEX) tablet 20 mg  20 mg Oral Daily Lindajo Royal V, MD   20 mg at 03/08/24 1037   traZODone (DESYREL) tablet 50 mg  50 mg Oral QHS Andris Baumann, MD   50 mg  at 03/07/24 2136     Discharge Medications: Please see discharge summary for a list of discharge medications.  Relevant Imaging Results:  Relevant Lab Results:   Additional Information SS#: [id number]  Marlowe Sax, RN

## 2024-03-08 NOTE — Progress Notes (Signed)
 Progress Note   Patient: Gregory Shepherd:[id number] DOB: 11/22/1946 DOA: 03/06/2024     0 DOS: the patient was seen and examined on 03/08/2024   Brief hospital course:  Gregory Shepherd is a 78 y.o. male with medical history significant for hypertension, hyperlipidemia, PVD, CAD with CABG,sCHF, hypothyroidism, depression, BPH, anemia, A-fib on Eliquis recently hospitalized from 3/25 to 3/28 for repair of left femoral neck fracture discharged to SNF with Foley due to acute urinary retention, was sent from the facility for concern for hemoptysis versus hematemesis. He had blood streaked sputum. CTA chest negative for PE. CT abdomen showing a myriad of findings with concern for stercoral proctitis among other nonacute appearing findings.   Hb remained stable even after starting Eliquis yesterday. He deneis any more episodes of blood tinged sputum. Stable to be discharged to facility, awaiting insurance auth.  Assessment and Plan: Hemoptysis - resolved. H/H stable.  Resume Eliquis. No more episodes of hematemesis. Monitor H&H.  Chronic combined systolic and diastolic CHF (congestive heart failure)  Clinically euvolemic Continue home dose torsemide 20 mg daily    S/p repair of Fracture of femoral neck, left, closed (HCC):  No acute issues Continue PT OT. Return to the facility after insurance authorization.   CAD (coronary artery disease): No chest pain Lipitor, Imdur, Ranexa, as needed nitroglycerin   Atrial fibrillation, persistent (HCC): Heart rate 56 Coreg with holding parameters Resumed Eliquis.   Essential (primary) hypertension IV hydralazine as needed Coreg, Imdur, torsemide   Chronic hyponatremia Stable low.  Continue to monitor.   Dyslipidemia, goal LDL below 70 Lipitor   BPH with obstruction/lower urinary tract symptoms Continue Proscar. Advised RN to remove Foley catheter and do voiding trial.   Depression Continue home medications    Out of bed to  chair. Incentive spirometry. Nursing supportive care. Fall, aspiration precautions. Diet:  Diet Orders (From admission, onward)     Start     Ordered   03/07/24 1228  Diet Heart Room service appropriate? Yes; Fluid consistency: Thin  Diet effective now       Question Answer Comment  Room service appropriate? Yes   Fluid consistency: Thin      03/07/24 1228           DVT prophylaxis: SCDs Start: 03/07/24 0330 apixaban (ELIQUIS) tablet 5 mg  Level of care: Telemetry Medical   Code Status: Full Code  Subjective: Patient is seen and examined today morning.  He is working with physical therapy.  Sitting in a chair.  He is at baseline as per PT.  Eating fair.  Physical Exam: Vitals:   03/08/24 0422 03/08/24 1011 03/08/24 1013 03/08/24 1030  BP: (!) 152/56 (!) 103/48  (!) 112/36  Pulse: (!) 54 (!) 56  (!) 54  Resp: 20  20   Temp: 98.3 F (36.8 C) 97.7 F (36.5 C)    TempSrc: Oral     SpO2: 96% 99%    Weight:      Height:        General - Elderly ill looking Caucasian male, no apparent distress HEENT - PERRLA, EOMI, atraumatic head, non tender sinuses. Lung - Clear, basal rales, rhonchi, wheezes. Heart - S1, S2 heard, no murmurs, rubs, trace pedal edema. Abdomen - Soft, non tender, bowel sounds, Foley noted Neuro - Alert, awake and oriented x 3, non focal exam. Skin - Warm and dry.  Data Reviewed:      Latest Ref Rng & Units 03/08/2024    3:56  AM 03/07/2024    8:24 AM 03/07/2024    4:25 AM  CBC  Hemoglobin 13.0 - 17.0 g/dL 16.1  09.6  04.5   Hematocrit 39.0 - 52.0 % 31.9         Latest Ref Rng & Units 03/08/2024    3:56 AM 03/06/2024   12:01 PM 03/01/2024    3:31 AM  BMP  Glucose 70 - 99 mg/dL 90  409  90   BUN 8 - 23 mg/dL 12  17  18    Creatinine 0.61 - 1.24 mg/dL 8.11  9.14  7.82   Sodium 135 - 145 mmol/L 130  131  132   Potassium 3.5 - 5.1 mmol/L 3.2  4.3  3.5   Chloride 98 - 111 mmol/L 102  102  101   CO2 22 - 32 mmol/L 22  24  25    Calcium 8.9 - 10.3 mg/dL  7.8  8.1  7.9    CT ABDOMEN PELVIS WO CONTRAST Result Date: 03/06/2024 CLINICAL DATA:  Abdominal pain, acute, nonlocalized. Patient reports spitting up blood onset 5:30 a.m. today. Left femoral surgery performed 8 days ago. EXAM: CT ABDOMEN AND PELVIS WITHOUT CONTRAST TECHNIQUE: Multidetector CT imaging of the abdomen and pelvis was performed following the standard protocol without IV contrast. RADIATION DOSE REDUCTION: This exam was performed according to the departmental dose-optimization program which includes automated exposure control, adjustment of the mA and/or kV according to patient size and/or use of iterative reconstruction technique. COMPARISON:  CTA abdomen pelvis 01/24/2024, and chest CT without contrast partially including the abdomen 09/20/2022. the patient had CTA chest earlier today. FINDINGS: Lower chest: Bilateral minimal pleural effusions are again noted. There is bronchial thickening in the lower lobes but the lung bases are clear of infiltrates. There is mild cardiomegaly, with three-vessel coronary calcifications and CABG changes noted on the CTA chest today. There is no pericardial effusion. Hepatobiliary: Single 8 mm stone in the proximal gallbladder again noted. No thickening or biliary dilatation. The unremarkable unenhanced liver parenchyma. Pancreas: No abnormality. Spleen: No abnormality. Adrenals/Urinary Tract: There is no adrenal mass. 1.7 cm Bosniak 1 cyst in the upper pole right kidney again measures 8.3 Hounsfield units, 1.7 cm. The there is a 1 cm Bosniak 1 cyst in the posterior left kidney measuring 1 cm, 7 Hounsfield units. No follow-up imaging is recommended. There is no contour deforming mass of either kidney or other focal abnormality. There is contrast in the collecting systems. There is no hydronephrosis or ureteral stone. There is contrast in the bladder, which is catheterized and partially empty. Bladder is unremarkable for the degree of distention but there again is a  prominent impression on the bladder base by the enlarged prostate. Stomach/Bowel: The gastric wall and unopacified small bowel are unremarkable. The normal appendix is well seen. There is either mild wall thickening or changes of underdistention in the ascending colon. Correlate clinically for underlying colitis. There is advanced diverticulosis in the descending and sigmoid colon without findings of diverticulitis. There is moderate retained stool in the rectum. There is slight rectal thickening and adjacent stranding, in this case unclear if this is congestive in etiology or due to stercoral proctitis given the additional finding of mild body wall edema. Vascular/Lymphatic: There is heavy aortic calcific plaque, visceral branch vessel atherosclerosis also noted but no AAA. No adenopathy is seen. Reproductive: Enlarged prostate impressing into the bladder base, 6.3 cm transverse, unchanged since 01/24/2024. There are dystrophic calcifications posteriorly. Both testicles are in the scrotal  sac. Other: There is mild body wall edema in the flanks and upper thighs not seen previously, trace posterior deep pelvic ascites. There is no free hemorrhage, free air or localizing collection. Musculoskeletal: There is dextroscoliosis, osteopenia and degenerative change of the spine. There are 3 threaded hip pins newly seen in the proximal left femur. Mild swelling noted in the anterior compartment upper left thigh musculature, probably postsurgical in etiology. Correlate clinically for myositis. There is acquired spinal stenosis L3-4 and L4-5. Multilevel foraminal stenosis lumbar spine. Multilevel subacute left rib fractures partially visible, described in detail in today's earlier report. IMPRESSION: 1. Mild body wall edema in the flanks and upper thighs, trace posterior deep pelvic ascites. 2. Moderate retained stool in the rectum with slight rectal thickening and adjacent stranding, in this case unclear if this is congestive  in etiology or due to stercoral proctitis given the mild body wall edema. 3. Mild wall thickening or changes of underdistention in the ascending colon. Correlate clinically for underlying colitis. 4. Diverticulosis without evidence of diverticulitis. 5. Cholelithiasis. 6. Aortic and branch vessel atherosclerosis, heaviest in the aorta and common iliac arteries. 7. Enlarged prostate impressing into the bladder base. 8. Mild swelling in the anterior compartment upper left thigh musculature, probably postsurgical in etiology. Correlate clinically for myositis. 9. Osteopenia, scoliosis and degenerative change. 10. Multilevel subacute left rib fractures partially visible, described in detail in today's earlier report. 11. Cardiomegaly with minimal pleural effusions. Post CABG with calcific CAD. Electronically Signed   By: Almira Bar M.D.   On: 03/06/2024 22:30   CT Angio Chest PE W and/or Wo Contrast Result Date: 03/06/2024 CLINICAL DATA:  Hemoptysis since this morning, recent left femur surgery, anticoagulated EXAM: CT ANGIOGRAPHY CHEST WITH CONTRAST TECHNIQUE: Multidetector CT imaging of the chest was performed using the standard protocol during bolus administration of intravenous contrast. Multiplanar CT image reconstructions and MIPs were obtained to evaluate the vascular anatomy. RADIATION DOSE REDUCTION: This exam was performed according to the departmental dose-optimization program which includes automated exposure control, adjustment of the mA and/or kV according to patient size and/or use of iterative reconstruction technique. CONTRAST:  75mL OMNIPAQUE IOHEXOL 350 MG/ML SOLN COMPARISON:  09/20/2022, 02/20/2024 FINDINGS: Cardiovascular: This is a technically adequate evaluation of the pulmonary vasculature. No filling defects or pulmonary emboli. The heart is enlarged without pericardial effusion. Dilated aortic root measuring up to 3.8 cm without frank aneurysm or evidence of dissection. Stable aortic  valve calcification and diffuse atherosclerosis of the aorta and native coronary vasculature. Postsurgical changes from prior CABG. Mediastinum/Nodes: No enlarged mediastinal, hilar, or axillary lymph nodes. Thyroid gland, trachea, and esophagus demonstrate no significant findings. Lungs/Pleura: Trace bilateral pleural effusions and dependent lower lobe atelectasis, right greater than left. No airspace disease or pneumothorax. Mild upper lobe predominant emphysema. There is bilateral bronchial wall thickening, greatest within the lower lobes, consistent with bronchitis or reactive airway disease. Upper Abdomen: No acute abnormality. Musculoskeletal: There are acute to subacute appearing left anterolateral third through seventh rib fractures, without callus formation. Older subacute fractures are seen involving the left posterior fifth through ninth ribs at the costovertebral junctions, with moderate callus formation. Reconstructed images demonstrate no additional findings. Review of the MIP images confirms the above findings. IMPRESSION: 1. No evidence of pulmonary embolus. 2. Trace bilateral pleural effusions and minimal dependent lower lobe atelectasis. 3. Bilateral bronchial wall thickening compatible with bronchitis or reactive airway disease. 4. Multiple left rib fractures of varying ages, with more acute fractures involving the left anterolateral  third through seventh ribs and more subacute fractures involving the left posterior fifth through ninth ribs. 5. Aortic Atherosclerosis (ICD10-I70.0) and Emphysema (ICD10-J43.9). Electronically Signed   By: Sharlet Salina M.D.   On: 03/06/2024 19:05    Family Communication: Discussed with patient, he understand and agree. All questions answered.  Disposition: Status is: Observation The patient remains OBS appropriate and will d/c before 2 midnights.  Planned Discharge Destination: Rehab awaiting insurance auth     Time spent: 38  minutes  Author: Marcelino Duster, MD 03/08/2024 1:33 PM Secure chat 7am to 7pm For on call review [email or web address].

## 2024-03-08 NOTE — Plan of Care (Signed)

## 2024-03-08 NOTE — Plan of Care (Signed)
  Problem: Clinical Measurements: Goal: Diagnostic test results will improve Outcome: Progressing   Problem: Activity: Goal: Risk for activity intolerance will decrease Outcome: Progressing   Problem: Coping: Goal: Level of anxiety will decrease Outcome: Progressing   Problem: Pain Managment: Goal: General experience of comfort will improve and/or be controlled Outcome: Progressing

## 2024-03-08 NOTE — Progress Notes (Signed)
 Physical Therapy Treatment Patient Details Name: Gregory Shepherd MRN: [id number] DOB: 01-22-1946 Today's Date: 03/08/2024   History of Present Illness Gregory Shepherd is a 77yoM who comes to [facility] ED from STR due to concerns for hematemesis v hemoptysis. Pt DC from here to [facility] just a week ago s/p L hip closed reduction percutaneous pinning on 3/25. Pt presented to ED after 4 falls on 3/16 resulting in L femoral neck fx/multiple abrasions to elbows, but left before results given. PMH significant for PMH of CAD/CM/CABG/EF 35%, A. Fib, cardioversion on 02/17/24, DM 2, HTN, PVD, L carpal tunnel, L ulnar neuropathy, HLD, depression, anemia. Pt had hypotensive presyncope on POD1 and lesser degree on POD2.    PT Comments  Pt sleepy this morning, has not eaten yet, would like to defer PT services, but is motivated by the call of the nature. Pt  remains generally limited by weakness, pain, and falls anxiety. Chronic LUE neuro deficits limit use of UE on walker and LLE is not very tolerate of weight bearing. 3 mod-maxA step pivot transfers in session for toileting duties, pt does best when able to get COM over BOS, otherwise is made more anxious by sense of falls. Unfortunately nature's call did not go through. Pt left up in recliner at end of session with meal presented and MD at bedside.    If plan is discharge home, recommend the following: A lot of help with bathing/dressing/bathroom;A lot of help with walking and/or transfers;Assist for transportation;Help with stairs or ramp for entrance   Can travel by private vehicle     No  Equipment Recommendations  None recommended by PT    Recommendations for Other Services       Precautions / Restrictions Precautions Precautions: Fall Restrictions LLE Weight Bearing Per Provider Order: Weight bearing as tolerated     Mobility  Bed Mobility Overal bed mobility: Needs Assistance       Supine to sit: Mod assist, HOB elevated           Transfers   Equipment used: Rolling walker (2 wheels), 1 person hand held assist   Sit to Stand: Mod assist   Step pivot transfers: Mod assist       General transfer comment: attempted STS with RW, but ultimately pt requires a dependent transfer without device x3    Ambulation/Gait Ambulation/Gait assistance:  (too weak, anxious about falling)                 Stairs             Wheelchair Mobility     Tilt Bed    Modified Rankin (Stroke Patients Only)       Balance                                            Communication    Cognition Arousal: Alert Behavior During Therapy: WFL for tasks assessed/performed, Anxious (high falls activity during session)   PT - Cognitive impairments: No apparent impairments                                Cueing    Exercises      General Comments        Pertinent Vitals/Pain Pain Assessment Pain Assessment: No/denies pain Pain Intervention(s): Limited activity within patient's tolerance  Home Living                          Prior Function            PT Goals (current goals can now be found in the care plan section) Acute Rehab PT Goals Patient Stated Goal: return to STR and begin intense rehab services PT Goal Formulation: With patient Time For Goal Achievement: 03/21/24 Potential to Achieve Goals: Good Progress towards PT goals: Progressing toward goals    Frequency    Min 3X/week      PT Plan      Co-evaluation              AM-PAC PT "6 Clicks" Mobility   Outcome Measure  Help needed turning from your back to your side while in a flat bed without using bedrails?: A Lot Help needed moving from lying on your back to sitting on the side of a flat bed without using bedrails?: A Lot Help needed moving to and from a bed to a chair (including a wheelchair)?: A Lot Help needed standing up from a chair using your arms (e.g., wheelchair or  bedside chair)?: A Lot Help needed to walk in hospital room?: Total Help needed climbing 3-5 steps with a railing? : Total 6 Click Score: 10    End of Session   Activity Tolerance: Patient tolerated treatment well;Patient limited by fatigue;Patient limited by pain Patient left: with call bell/phone within reach;in chair;with nursing/sitter in room;with bed alarm set Nurse Communication: Mobility status PT Visit Diagnosis: Unsteadiness on feet (R26.81);Other abnormalities of gait and mobility (R26.89);Muscle weakness (generalized) (M62.81);History of falling (Z91.81);Dizziness and giddiness (R42)     Time: 1610-9604 PT Time Calculation (min) (ACUTE ONLY): 26 min  Charges:    $Therapeutic Activity: 23-37 mins PT General Charges $$ ACUTE PT VISIT: 1 Visit                    11:57 AM, 03/08/24 Rosamaria Lints, PT, DPT Physical Therapist - [facility]  [phone] (ASCOM)     Duey Liller C 03/08/2024, 11:52 AM

## 2024-03-08 NOTE — Evaluation (Signed)
 Occupational Therapy Evaluation Patient Details Name: Gregory Shepherd MRN: [id number] DOB: 1946/03/23 Today's Date: 03/08/2024   History of Present Illness   Gregory Shepherd is a 78 y.o. male with medical history significant for hypertension, hyperlipidemia, PVD, CAD with CABG,sCHF, hypothyroidism, depression, BPH, anemia, A-fib on Eliquis recently hospitalized from 3/25 to 3/28 for repair of left femoral neck fracture discharged to SNF with Foley due to acute urinary retention, was sent from the facility for concern for hemoptysis versus hematemesis.   Clinical Impressions [name] was seen for OT evaluation this date. Prior to hospital admission, pt was recently at [facility] requiring assist for mobility. Pt lives with spouse. Pt currently requires MAX A chair>bed squat pivot t/f. MAX A don/doff compression socks at bed level. Spouse and pt educated on adaptive dressing/grooming techniques and compression stocking mgmt.  Pt would benefit from skilled OT to address noted impairments and functional limitations (see below for any additional details). Upon hospital discharge, recommend OT follow up <3 hours/day.    If plan is discharge home, recommend the following:   A lot of help with walking and/or transfers;A lot of help with bathing/dressing/bathroom;Assistance with cooking/housework;Direct supervision/assist for medications management;Direct supervision/assist for financial management;Assist for transportation;Help with stairs or ramp for entrance     Functional Status Assessment   Patient has had a recent decline in their functional status and demonstrates the ability to make significant improvements in function in a reasonable and predictable amount of time.     Equipment Recommendations   BSC/3in1     Recommendations for Other Services         Precautions/Restrictions   Precautions Precautions: Fall Recall of Precautions/Restrictions: Intact Restrictions Weight  Bearing Restrictions Per Provider Order: Yes LLE Weight Bearing Per Provider Order: Weight bearing as tolerated     Mobility Bed Mobility Overal bed mobility: Needs Assistance Bed Mobility: Sit to Supine       Sit to supine: Mod assist        Transfers Overall transfer level: Needs assistance   Transfers: Bed to chair/wheelchair/BSC     Squat pivot transfers: Max assist              Balance Overall balance assessment: History of Falls, Needs assistance Sitting-balance support: Feet supported, Bilateral upper extremity supported Sitting balance-Leahy Scale: Fair                                     ADL either performed or assessed with clinical judgement   ADL Overall ADL's : Needs assistance/impaired                                       General ADL Comments: MAX A simulated BSC t/f. MAX A don/doff compression socks at bed level      Pertinent Vitals/Pain Pain Assessment Pain Assessment: Faces Faces Pain Scale: Hurts a little bit Pain Location: L hip Pain Descriptors / Indicators: Operative site guarding Pain Intervention(s): Limited activity within patient's tolerance, Repositioned     Extremity/Trunk Assessment Upper Extremity Assessment Upper Extremity Assessment: Generalized weakness   Lower Extremity Assessment Lower Extremity Assessment: Generalized weakness       Communication Communication Communication: No apparent difficulties   Cognition Arousal: Alert Behavior During Therapy: WFL for tasks assessed/performed Cognition: No apparent impairments  Following commands: Intact       Cueing  General Comments   Cueing Techniques: Verbal cues;Tactile cues      Exercises     Shoulder Instructions      Home Living Family/patient expects to be discharged to:: Private residence Living Arrangements: Spouse/significant other Available Help at Discharge:  Available PRN/intermittently;Family Type of Home: House Home Access: Stairs to enter [employer] of Steps: 2 (back of house, son takes pt up in wc), 8 in garage   Home Layout: Able to live on main level with bedroom/bathroom;Two level     Bathroom Shower/Tub: Producer, television/film/video: Handicapped height Bathroom Accessibility: Yes   Home Equipment: Rollator (4 wheels);Shower seat;Wheelchair - manual;Grab bars - toilet   Additional Comments: wc is wide; spouse is trying to get him new wc      Prior Functioning/Environment Prior Level of Function : History of Falls (last six months);Needs assist             Mobility Comments: frequent falls, pt reports balance and BLE weakness (says he falls doing "silly things" like picking up items from floor and cannot get up without assist). uses [employer] household distances, wc for MD appt transportation and community (son takes pt up stairs in wc) ADLs Comments: spouse helps with dressing, bathing and toileting    OT Problem List: Decreased strength;Decreased range of motion;Decreased activity tolerance;Impaired balance (sitting and/or standing);Decreased safety awareness;Decreased knowledge of use of DME or AE;Decreased knowledge of precautions;Impaired UE functional use   OT Treatment/Interventions: Self-care/ADL training;Neuromuscular education;DME and/or AE instruction;Therapeutic activities;Patient/family education;Balance training      OT Goals(Current goals can be found in the care plan section)   Acute Rehab OT Goals Patient Stated Goal: to go home OT Goal Formulation: With patient Time For Goal Achievement: 03/22/24 Potential to Achieve Goals: Good ADL Goals Pt Will Perform Grooming: with supervision;sitting Pt Will Perform Lower Body Dressing: with min assist;with caregiver independent in assisting;with adaptive equipment;sitting/lateral leans Pt Will Transfer to Toilet: with min assist;ambulating;bedside  commode   OT Frequency:  Min 2X/week    Co-evaluation              AM-PAC OT "6 Clicks" Daily Activity     Outcome Measure Help from another person eating meals?: None Help from another person taking care of personal grooming?: A Little Help from another person toileting, which includes using toliet, bedpan, or urinal?: A Lot Help from another person bathing (including washing, rinsing, drying)?: A Lot Help from another person to put on and taking off regular upper body clothing?: A Little Help from another person to put on and taking off regular lower body clothing?: A Lot 6 Click Score: 16   End of Session    Activity Tolerance: Patient tolerated treatment well Patient left: in bed;with call bell/phone within reach;with bed alarm set;with family/visitor present;with nursing/sitter in room  OT Visit Diagnosis: Muscle weakness (generalized) (M62.81);History of falling (Z91.81);Repeated falls (R29.6);Other abnormalities of gait and mobility (R26.89)                Time: 7829-5621 OT Time Calculation (min): 20 min Charges:  OT General Charges $OT Visit: 1 Visit OT Evaluation $OT Eval Low Complexity: 1 Low OT Treatments $Self Care/Home Management : 8-22 mins  Kathie Dike, M.S. OTR/L  03/08/24, 3:00 PM  ascom [phone]

## 2024-03-08 NOTE — Care Management Obs Status (Signed)
 MEDICARE OBSERVATION STATUS NOTIFICATION   Patient Details  Name: Gregory Shepherd MRN: [id number] Date of Birth: Nov 06, 1946   Medicare Observation Status Notification Given:  Orland Dec, CMA 03/08/2024, 10:11 AM

## 2024-03-08 NOTE — TOC Initial Note (Addendum)
 Transition of Care [facility]) - Initial/Assessment Note    Patient Details  Name: Gregory Shepherd MRN: [id number] Date of Birth: [birth year]  Transition of Care [facility]) CM/SW Contact:    Marlowe Sax, RN Phone Number: 03/08/2024, 10:21 AM  Clinical Narrative:                  The patient was at [facility] I reached out to [facility] to inquire if he can return under the same Ins auth His previous Ins approval expired will need a new Ins auth Pending Plan Auth ID: [id number]    Expected Discharge Plan: Skilled Nursing Facility Barriers to Discharge: SNF Pending bed offer   Patient Goals and CMS Choice            Expected Discharge Plan and Services   Discharge Planning Services: CM Consult Post Acute Care Choice: Skilled Nursing Facility Living arrangements for the past 2 months: Single Family Home                 DME Arranged: N/A DME Agency: NA       HH Arranged: NA          Prior Living Arrangements/Services Living arrangements for the past 2 months: Single Family Home Lives with:: Spouse Patient language and need for interpreter reviewed:: Yes Do you feel safe going back to the place where you live?: Yes      Need for Family Participation in Patient Care: Yes (Comment) Care giver support system in place?: Yes (comment)   Criminal Activity/Legal Involvement Pertinent to Current Situation/Hospitalization: No - Comment as needed  Activities of Daily Living   ADL Screening (condition at time of admission) Independently performs ADLs?: No Does the patient have a NEW difficulty with bathing/dressing/toileting/self-feeding that is expected to last >3 days?: No Does the patient have a NEW difficulty with getting in/out of bed, walking, or climbing stairs that is expected to last >3 days?: No Does the patient have a NEW difficulty with communication that is expected to last >3 days?: No Is the patient deaf or have difficulty hearing?: No Does the patient have  difficulty seeing, even when wearing glasses/contacts?: No Does the patient have difficulty concentrating, remembering, or making decisions?: No  Permission Sought/Granted Permission sought to share information with : Facility Medical sales representative, Family Supports Permission granted to share information with : Yes, Verbal Permission Granted  Share Information with NAME: Azai Gaffin  Permission granted to share info w AGENCY: SNF  Permission granted to share info w Relationship: Wife  Permission granted to share info w Contact Information: [phone]  Emotional Assessment Appearance:: Appears stated age Attitude/Demeanor/Rapport: Engaged Affect (typically observed): Accepting Orientation: : Oriented to Self, Oriented to Place, Oriented to  Time, Oriented to Situation Alcohol / Substance Use: Not Applicable Psych Involvement: No (comment)  Admission diagnosis:  Melena [K92.1] Colitis [K52.9] Hemoptysis [R04.2] Patient Active Problem List   Diagnosis Date Noted   Hemoptysis 03/06/2024   Malnutrition of moderate degree 03/01/2024   Orthostatic hypotension 02/29/2024   Fracture of femoral neck, left, closed (HCC) 02/28/2024   CAD (coronary artery disease) 02/28/2024   Chronic combined systolic and diastolic CHF (congestive heart failure) (HCC) 02/28/2024   Depression 02/28/2024   Hyponatremia 02/28/2024   Hypothyroidism 12/20/2022   Degenerative disc disease, cervical 10/12/2022   Smoker 08/24/2022   Left carpal tunnel syndrome 07/14/2022   Ulnar neuropathy at elbow, left 07/14/2022   Pronation deformity of ankle, acquired 05/11/2022   Left hand  weakness 02/12/2022   Gastritis and gastroduodenitis    Melena    Microcytic anemia    Atrial fibrillation, persistent (HCC)    Shortness of breath 06/19/2021   Hyperlipemia 06/19/2021   Chronic GERD 06/19/2021   Bilateral lower extremity edema 01/14/2021   Claudication in peripheral vascular disease (HCC) 06/04/2019    Dyslipidemia, goal LDL below 70 02/20/2019   Peripheral arterial disease (HCC) 02/03/2019   Diabetes (HCC) 01/04/2018   Nocturia 01/04/2018   Right leg pain 12/13/2017   Health care maintenance 07/17/2015   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 [place] [address] Phone: [phone] Fax: [phone]     Social Drivers of Health (SDOH) Social History: SDOH Screenings   Food Insecurity: No Food Insecurity (03/07/2024)  Housing: Low Risk  (03/07/2024)  Transportation Needs: No Transportation Needs (03/07/2024)  Utilities: Not At Risk (03/07/2024)  Financial Resource Strain: Low Risk  (06/22/2023)   Received from [facility]  Social Connections: Moderately Isolated (03/07/2024)  Tobacco Use: High Risk (03/06/2024)   SDOH Interventions:     Readmission Risk Interventions     No data to display

## 2024-03-09 DIAGNOSIS — R042 Hemoptysis: Secondary | ICD-10-CM | POA: Diagnosis not present

## 2024-03-09 DIAGNOSIS — S72002D Fracture of unspecified part of neck of left femur, subsequent encounter for closed fracture with routine healing: Secondary | ICD-10-CM

## 2024-03-09 DIAGNOSIS — K921 Melena: Secondary | ICD-10-CM | POA: Diagnosis not present

## 2024-03-09 DIAGNOSIS — G5602 Carpal tunnel syndrome, left upper limb: Secondary | ICD-10-CM

## 2024-03-09 DIAGNOSIS — E782 Mixed hyperlipidemia: Secondary | ICD-10-CM | POA: Diagnosis not present

## 2024-03-09 DIAGNOSIS — I4819 Other persistent atrial fibrillation: Secondary | ICD-10-CM

## 2024-03-09 DIAGNOSIS — I10 Essential (primary) hypertension: Secondary | ICD-10-CM

## 2024-03-09 DIAGNOSIS — E440 Moderate protein-calorie malnutrition: Secondary | ICD-10-CM

## 2024-03-09 NOTE — TOC Progression Note (Signed)
 Transition of Care [facility]) - Progression Note    Patient Details  Name: Gregory Shepherd MRN: [id number] Date of Birth: [birth year]  Transition of Care [facility]) CM/SW Contact  Marlowe Sax, RN Phone Number: 03/09/2024, 9:42 AM  Clinical Narrative:     Approved PlanAuthID:[id number] plan dates:4/3-03/12/24 next review date: 03/12/2024   Expected Discharge Plan: Skilled Nursing Facility Barriers to Discharge: SNF Pending bed offer  Expected Discharge Plan and Services   Discharge Planning Services: CM Consult Post Acute Care Choice: Skilled Nursing Facility Living arrangements for the past 2 months: Single Family Home                 DME Arranged: N/A DME Agency: NA       HH Arranged: NA           Social Determinants of Health (SDOH) Interventions SDOH Screenings   Food Insecurity: No Food Insecurity (03/07/2024)  Housing: Low Risk  (03/07/2024)  Transportation Needs: No Transportation Needs (03/07/2024)  Utilities: Not At Risk (03/07/2024)  Financial Resource Strain: Low Risk  (06/22/2023)   Received from [facility]  Social Connections: Moderately Isolated (03/07/2024)  Tobacco Use: High Risk (03/06/2024)    Readmission Risk Interventions     No data to display

## 2024-03-09 NOTE — Plan of Care (Signed)

## 2024-03-09 NOTE — Plan of Care (Signed)
  Problem: Clinical Measurements: Goal: Diagnostic test results will improve Outcome: Progressing   Problem: Activity: Goal: Risk for activity intolerance will decrease Outcome: Progressing   Problem: Nutrition: Goal: Adequate nutrition will be maintained Outcome: Progressing   Problem: Coping: Goal: Level of anxiety will decrease Outcome: Progressing   Problem: Safety: Goal: Ability to remain free from injury will improve Outcome: Progressing

## 2024-03-09 NOTE — TOC Transition Note (Signed)
 Transition of Care [facility]) - Discharge Note   Patient Details  Name: Gregory Shepherd MRN: [id number] Date of Birth: 06-14-1946  Transition of Care [facility]) CM/SW Contact:  Marlowe Sax, RN Phone Number: 03/09/2024, 10:49 AM   Clinical Narrative:   Called and spoke to wife I notified her that he will go to [facility] room [facility] EMS called and arranged transport    Final next level of care: Skilled Nursing Facility Barriers to Discharge: SNF Pending bed offer   Patient Goals and CMS Choice            Discharge Placement                       Discharge Plan and Services Additional resources added to the After Visit Summary for     Discharge Planning Services: CM Consult Post Acute Care Choice: Skilled Nursing Facility          DME Arranged: N/A DME Agency: NA       HH Arranged: NA          Social Drivers of Health (SDOH) Interventions SDOH Screenings   Food Insecurity: No Food Insecurity (03/07/2024)  Housing: Low Risk  (03/07/2024)  Transportation Needs: No Transportation Needs (03/07/2024)  Utilities: Not At Risk (03/07/2024)  Financial Resource Strain: Low Risk  (06/22/2023)   Received from [facility]  Social Connections: Moderately Isolated (03/07/2024)  Tobacco Use: High Risk (03/06/2024)     Readmission Risk Interventions     No data to display

## 2024-03-09 NOTE — Discharge Summary (Signed)
 Physician Discharge Summary   Patient: Gregory Shepherd MRN: [id number] DOB: 1946-01-22  Admit date:     03/06/2024  Discharge date: 03/09/24  Discharge Physician: Marcelino Duster   PCP: Lauro Regulus, MD   Recommendations at discharge:   PCP follow up in 1 week. Urology follow up for voiding trial.  Discharge Diagnoses: Principal Problem:   Melena Active Problems:   Fracture of femoral neck, left, closed (HCC)   CAD (coronary artery disease)   Atrial fibrillation, persistent (HCC)   Chronic combined systolic and diastolic CHF (congestive heart failure) (HCC)   Essential (primary) hypertension   Hyponatremia   Hyperlipemia   Left carpal tunnel syndrome   Malnutrition of moderate degree   Hemoptysis  Resolved Problems:   * No resolved hospital problems. *  Hospital Course: Gregory Shepherd is a 78 y.o. male with medical history significant for hypertension, hyperlipidemia, PVD, CAD with CABG,sCHF, hypothyroidism, depression, BPH, anemia, A-fib on Eliquis recently hospitalized from 3/25 to 3/28 for repair of left femoral neck fracture discharged to SNF with Foley due to acute urinary retention, was sent from the facility for concern for hemoptysis versus hematemesis. He had blood streaked sputum. CTA chest negative for PE. CT abdomen showing a myriad of findings with concern for stercoral proctitis among other nonacute appearing findings.    Hb remained stable even after starting Eliquis 03/07/24. He deneis any more episodes of blood tinged sputum. Stable to be discharged to facility. He will need urology follow up for voiding trial.  Assessment and Plan: Hemoptysis - resolved. H/H stable. No melena noted. Resumed Eliquis. No more episodes of hematemesis. Hb remained stable.   Chronic combined systolic and diastolic CHF (congestive heart failure)  Clinically euvolemic Home dose torsemide 20 mg daily resumed.   S/p repair of Fracture of femoral neck, left,  closed (HCC):  No acute issues Continue PT OT at the facility.   CAD (coronary artery disease): No chest pain Lipitor, Imdur, Ranexa, as needed nitroglycerin   Atrial fibrillation, persistent (HCC): Heart rate 56 stable. No complaints. Continue Coreg. Resumed Eliquis.   Essential (primary) hypertension IV hydralazine as needed Continue Coreg, Imdur, torsemide   Chronic hyponatremia Stable low.  Monitor as outpatient.  Hypokalemia- Monitor electrolytes and replete. He is on potassium supplements.   Dyslipidemia, goal LDL below 70 Continue Lipitor   BPH with obstruction/lower urinary tract symptoms Continue Proscar. Foley to be removes at urology office.   Depression Continue home medications       Consultants: none Procedures performed: none  Disposition: Skilled nursing facility Diet recommendation:  Discharge Diet Orders (From admission, onward)     Start     Ordered   03/09/24 0000  Diet - low sodium heart healthy        03/09/24 0955           Cardiac diet DISCHARGE MEDICATION: Allergies as of 03/09/2024       Reactions   Omeprazole    doesn't work well with eliquis    Other Palpitations   Certain steroids give patient heart palpitations  Allergy to green peppers causes upset stomach         Medication List     STOP taking these medications    HYDROcodone-acetaminophen 5-325 MG tablet Commonly known as: NORCO/VICODIN       TAKE these medications    acetaminophen 325 MG tablet Commonly known as: TYLENOL Take 2 tablets (650 mg total) by mouth every 6 (six) hours as needed for  mild pain (pain score 1-3) or fever.   amiodarone 200 MG tablet Commonly known as: PACERONE Take 200 mg by mouth daily.   ascorbic acid 500 MG tablet Commonly known as: VITAMIN C Take 500 mg by mouth 2 (two) times daily.   atorvastatin 80 MG tablet Commonly known as: LIPITOR Take 80 mg by mouth daily.   carvedilol 3.125 MG tablet Commonly known as:  COREG TAKE ONE TABLET BY MOUTH TWICE A DAY   cyanocobalamin 1000 MCG tablet Commonly known as: VITAMIN B12 Take 1,000 mcg by mouth daily.   docusate sodium 100 MG capsule Commonly known as: COLACE Take 1 capsule (100 mg total) by mouth 2 (two) times daily.   DULoxetine 20 MG capsule Commonly known as: CYMBALTA Take 1 capsule (20 mg total) by mouth daily.   Eliquis 5 MG Tabs tablet Generic drug: apixaban TAKE ONE TABLET BY MOUTH TWICE A DAY   ferrous sulfate 325 (65 FE) MG tablet Take 325 mg by mouth daily.   finasteride 5 MG tablet Commonly known as: PROSCAR Take 1 tablet (5 mg total) by mouth daily. What changed: when to take this   gabapentin 600 MG tablet Commonly known as: NEURONTIN Take 300 mg by mouth 2 (two) times daily.   isosorbide mononitrate 60 MG 24 hr tablet Commonly known as: IMDUR Take 1.5 tablets (90 mg total) by mouth daily.   lansoprazole 30 MG capsule Commonly known as: PREVACID TAKE ONE CAPSULE BY MOUTH DAILY   multivitamin tablet Take 1 tablet by mouth daily.   nitroGLYCERIN 0.4 MG SL tablet Commonly known as: NITROSTAT PLACE ONE TABLET UNDER THE TONGUE AS NEEDED FOR CHEST PAIN   ondansetron 4 MG disintegrating tablet Commonly known as: ZOFRAN-ODT Take 4 mg by mouth every 8 (eight) hours as needed.   potassium chloride 10 MEQ tablet Commonly known as: KLOR-CON TAKE TWO TABLETS BY MOUTH TWICE A DAY What changed:  how much to take when to take this   pyridOXINE 100 MG tablet Commonly known as: VITAMIN B6 Take 100 mg by mouth 2 (two) times daily.   ranolazine 500 MG 12 hr tablet Commonly known as: RANEXA Take 1 tablet (500 mg total) by mouth 2 (two) times daily.   tamsulosin 0.4 MG Caps capsule Commonly known as: FLOMAX Take 1 capsule (0.4 mg total) by mouth daily.   torsemide 20 MG tablet Commonly known as: DEMADEX Take 1 tablet (20 mg total) by mouth 2 (two) times daily. What changed: when to take this   traZODone 50 MG  tablet Commonly known as: DESYREL Take 50 mg by mouth at bedtime.   Vitamin D 50 MCG (2000 UT) Caps Take 4,000 Units by mouth daily.        Discharge Exam: Filed Weights   03/06/24 1159  Weight: 74.8 kg      03/09/2024    8:19 AM 03/09/2024    3:58 AM 03/08/2024    9:31 PM  Vitals with BMI  Systolic 156 130 387  Diastolic 56 79 46  Pulse 51 51 52    General - Elderly ill looking Caucasian male, no apparent distress HEENT - PERRLA, EOMI, atraumatic head, non tender sinuses. Lung - Clear, basal rales, rhonchi, wheezes. Heart - S1, S2 heard, no murmurs, rubs, trace pedal edema. Abdomen - Soft, non tender, bowel sounds, Foley intact. Neuro - Alert, awake and oriented x 3, non focal exam. Skin - Warm and dry.  Condition at discharge: stable  The results of significant diagnostics from  this hospitalization (including imaging, microbiology, ancillary and laboratory) are listed below for reference.   Imaging Studies: CT ABDOMEN PELVIS WO CONTRAST Result Date: 03/06/2024 CLINICAL DATA:  Abdominal pain, acute, nonlocalized. Patient reports spitting up blood onset 5:30 a.m. today. Left femoral surgery performed 8 days ago. EXAM: CT ABDOMEN AND PELVIS WITHOUT CONTRAST TECHNIQUE: Multidetector CT imaging of the abdomen and pelvis was performed following the standard protocol without IV contrast. RADIATION DOSE REDUCTION: This exam was performed according to the departmental dose-optimization program which includes automated exposure control, adjustment of the mA and/or kV according to patient size and/or use of iterative reconstruction technique. COMPARISON:  CTA abdomen pelvis 01/24/2024, and chest CT without contrast partially including the abdomen 09/20/2022. the patient had CTA chest earlier today. FINDINGS: Lower chest: Bilateral minimal pleural effusions are again noted. There is bronchial thickening in the lower lobes but the lung bases are clear of infiltrates. There is mild  cardiomegaly, with three-vessel coronary calcifications and CABG changes noted on the CTA chest today. There is no pericardial effusion. Hepatobiliary: Single 8 mm stone in the proximal gallbladder again noted. No thickening or biliary dilatation. The unremarkable unenhanced liver parenchyma. Pancreas: No abnormality. Spleen: No abnormality. Adrenals/Urinary Tract: There is no adrenal mass. 1.7 cm Bosniak 1 cyst in the upper pole right kidney again measures 8.3 Hounsfield units, 1.7 cm. The there is a 1 cm Bosniak 1 cyst in the posterior left kidney measuring 1 cm, 7 Hounsfield units. No follow-up imaging is recommended. There is no contour deforming mass of either kidney or other focal abnormality. There is contrast in the collecting systems. There is no hydronephrosis or ureteral stone. There is contrast in the bladder, which is catheterized and partially empty. Bladder is unremarkable for the degree of distention but there again is a prominent impression on the bladder base by the enlarged prostate. Stomach/Bowel: The gastric wall and unopacified small bowel are unremarkable. The normal appendix is well seen. There is either mild wall thickening or changes of underdistention in the ascending colon. Correlate clinically for underlying colitis. There is advanced diverticulosis in the descending and sigmoid colon without findings of diverticulitis. There is moderate retained stool in the rectum. There is slight rectal thickening and adjacent stranding, in this case unclear if this is congestive in etiology or due to stercoral proctitis given the additional finding of mild body wall edema. Vascular/Lymphatic: There is heavy aortic calcific plaque, visceral branch vessel atherosclerosis also noted but no AAA. No adenopathy is seen. Reproductive: Enlarged prostate impressing into the bladder base, 6.3 cm transverse, unchanged since 01/24/2024. There are dystrophic calcifications posteriorly. Both testicles are in the  scrotal sac. Other: There is mild body wall edema in the flanks and upper thighs not seen previously, trace posterior deep pelvic ascites. There is no free hemorrhage, free air or localizing collection. Musculoskeletal: There is dextroscoliosis, osteopenia and degenerative change of the spine. There are 3 threaded hip pins newly seen in the proximal left femur. Mild swelling noted in the anterior compartment upper left thigh musculature, probably postsurgical in etiology. Correlate clinically for myositis. There is acquired spinal stenosis L3-4 and L4-5. Multilevel foraminal stenosis lumbar spine. Multilevel subacute left rib fractures partially visible, described in detail in today's earlier report. IMPRESSION: 1. Mild body wall edema in the flanks and upper thighs, trace posterior deep pelvic ascites. 2. Moderate retained stool in the rectum with slight rectal thickening and adjacent stranding, in this case unclear if this is congestive in etiology or due to stercoral  proctitis given the mild body wall edema. 3. Mild wall thickening or changes of underdistention in the ascending colon. Correlate clinically for underlying colitis. 4. Diverticulosis without evidence of diverticulitis. 5. Cholelithiasis. 6. Aortic and branch vessel atherosclerosis, heaviest in the aorta and common iliac arteries. 7. Enlarged prostate impressing into the bladder base. 8. Mild swelling in the anterior compartment upper left thigh musculature, probably postsurgical in etiology. Correlate clinically for myositis. 9. Osteopenia, scoliosis and degenerative change. 10. Multilevel subacute left rib fractures partially visible, described in detail in today's earlier report. 11. Cardiomegaly with minimal pleural effusions. Post CABG with calcific CAD. Electronically Signed   By: Almira Bar M.D.   On: 03/06/2024 22:30   CT Angio Chest PE W and/or Wo Contrast Result Date: 03/06/2024 CLINICAL DATA:  Hemoptysis since this morning, recent  left femur surgery, anticoagulated EXAM: CT ANGIOGRAPHY CHEST WITH CONTRAST TECHNIQUE: Multidetector CT imaging of the chest was performed using the standard protocol during bolus administration of intravenous contrast. Multiplanar CT image reconstructions and MIPs were obtained to evaluate the vascular anatomy. RADIATION DOSE REDUCTION: This exam was performed according to the departmental dose-optimization program which includes automated exposure control, adjustment of the mA and/or kV according to patient size and/or use of iterative reconstruction technique. CONTRAST:  75mL OMNIPAQUE IOHEXOL 350 MG/ML SOLN COMPARISON:  09/20/2022, 02/20/2024 FINDINGS: Cardiovascular: This is a technically adequate evaluation of the pulmonary vasculature. No filling defects or pulmonary emboli. The heart is enlarged without pericardial effusion. Dilated aortic root measuring up to 3.8 cm without frank aneurysm or evidence of dissection. Stable aortic valve calcification and diffuse atherosclerosis of the aorta and native coronary vasculature. Postsurgical changes from prior CABG. Mediastinum/Nodes: No enlarged mediastinal, hilar, or axillary lymph nodes. Thyroid gland, trachea, and esophagus demonstrate no significant findings. Lungs/Pleura: Trace bilateral pleural effusions and dependent lower lobe atelectasis, right greater than left. No airspace disease or pneumothorax. Mild upper lobe predominant emphysema. There is bilateral bronchial wall thickening, greatest within the lower lobes, consistent with bronchitis or reactive airway disease. Upper Abdomen: No acute abnormality. Musculoskeletal: There are acute to subacute appearing left anterolateral third through seventh rib fractures, without callus formation. Older subacute fractures are seen involving the left posterior fifth through ninth ribs at the costovertebral junctions, with moderate callus formation. Reconstructed images demonstrate no additional findings. Review of  the MIP images confirms the above findings. IMPRESSION: 1. No evidence of pulmonary embolus. 2. Trace bilateral pleural effusions and minimal dependent lower lobe atelectasis. 3. Bilateral bronchial wall thickening compatible with bronchitis or reactive airway disease. 4. Multiple left rib fractures of varying ages, with more acute fractures involving the left anterolateral third through seventh ribs and more subacute fractures involving the left posterior fifth through ninth ribs. 5. Aortic Atherosclerosis (ICD10-I70.0) and Emphysema (ICD10-J43.9). Electronically Signed   By: Sharlet Salina M.D.   On: 03/06/2024 19:05   DG HIP UNILAT WITH PELVIS 2-3 VIEWS LEFT Result Date: 02/28/2024 CLINICAL DATA:  Elective surgery.  Left hip pinning. EXAM: DG HIP (WITH OR WITHOUT PELVIS) 2-3V LEFT COMPARISON:  CT left hip 02/27/2024 FINDINGS: Images were performed intraoperatively without the presence of a radiologist. There are 3 new longitudinal screws traversing the left femoral head, neck, and intertrochanteric region, spanning the previously seen proximal left femoral neck fracture. No hardware complication is seen. Total fluoroscopy images: 2 Total fluoroscopy time: 80 seconds Total dose: Radiation Exposure Index (as provided by the fluoroscopic device): 14.9 mGy air Kerma Please see intraoperative findings for further detail. IMPRESSION: Intraoperative  fluoroscopy for left hip pinning. Electronically Signed   By: Neita Garnet M.D.   On: 02/28/2024 13:09   DG C-Arm 1-60 Min-No Report Result Date: 02/28/2024 Fluoroscopy was utilized by the requesting physician.  No radiographic interpretation.   CT HIP LEFT WO CONTRAST Result Date: 02/27/2024 CLINICAL DATA:  Patient fell on 02/18/2024.  Pain and abrasions. EXAM: CT OF THE LEFT HIP WITHOUT CONTRAST TECHNIQUE: Multidetector CT imaging of the left hip was performed according to the standard protocol. Multiplanar CT image reconstructions were also generated. RADIATION  DOSE REDUCTION: This exam was performed according to the departmental dose-optimization program which includes automated exposure control, adjustment of the mA and/or kV according to patient size and/or use of iterative reconstruction technique. COMPARISON:  CT chest abdomen and pelvis 01/24/2024 FINDINGS: Bones/Joint/Cartilage Linear sclerosis along the subcapital region of the left femoral neck with slight deformity and cortical irregularity consistent with impacted minimally displaced fracture. Visualized acetabulum and left hemipelvis appear intact. No focal bone lesion or bone destruction. Ligaments Suboptimally assessed by CT. Muscles and Tendons No intramuscular mass or hematoma. Soft tissues Soft tissues are unremarkable. Incidental note of prostate gland enlargement, measuring 6.3 cm in diameter. IMPRESSION: 1. Transverse impacted subcapital fracture of the left hip. 2. Prostate enlargement. Electronically Signed   By: Burman Nieves M.D.   On: 02/27/2024 19:18   CT Head Wo Contrast Result Date: 02/20/2024 CLINICAL DATA:  Trauma. EXAM: CT HEAD WITHOUT CONTRAST CT CERVICAL SPINE WITHOUT CONTRAST TECHNIQUE: Multidetector CT imaging of the head and cervical spine was performed following the standard protocol without intravenous contrast. Multiplanar CT image reconstructions of the cervical spine were also generated. RADIATION DOSE REDUCTION: This exam was performed according to the departmental dose-optimization program which includes automated exposure control, adjustment of the mA and/or kV according to patient size and/or use of iterative reconstruction technique. COMPARISON:  None Available. FINDINGS: CT HEAD FINDINGS Brain: Moderate age-related atrophy and chronic microvascular ischemic changes. There is no acute intracranial hemorrhage. No mass effect or midline shift. No extra-axial fluid collection. Vascular: No hyperdense vessel or unexpected calcification. Skull: Normal. Negative for fracture or  focal lesion. Sinuses/Orbits: No acute finding. Other: None CT CERVICAL SPINE FINDINGS Alignment: No acute subluxation. There is straightening of normal cervical lordosis which may be positional or due to muscle spasm. Skull base and vertebrae: No acute fracture.  Osteopenia. Soft tissues and spinal canal: No prevertebral fluid or swelling. No visible canal hematoma. Disc levels:  No acute findings.  Degenerative changes. Upper chest: Negative. Other: Bilateral carotid bulb calcified plaques. IMPRESSION: 1. No acute intracranial pathology. Moderate age-related atrophy and chronic microvascular ischemic changes. 2. No acute/traumatic cervical spine pathology. Electronically Signed   By: Elgie Collard M.D.   On: 02/20/2024 18:10   CT Cervical Spine Wo Contrast Result Date: 02/20/2024 CLINICAL DATA:  Trauma. EXAM: CT HEAD WITHOUT CONTRAST CT CERVICAL SPINE WITHOUT CONTRAST TECHNIQUE: Multidetector CT imaging of the head and cervical spine was performed following the standard protocol without intravenous contrast. Multiplanar CT image reconstructions of the cervical spine were also generated. RADIATION DOSE REDUCTION: This exam was performed according to the departmental dose-optimization program which includes automated exposure control, adjustment of the mA and/or kV according to patient size and/or use of iterative reconstruction technique. COMPARISON:  None Available. FINDINGS: CT HEAD FINDINGS Brain: Moderate age-related atrophy and chronic microvascular ischemic changes. There is no acute intracranial hemorrhage. No mass effect or midline shift. No extra-axial fluid collection. Vascular: No hyperdense vessel or unexpected calcification. Skull:  Normal. Negative for fracture or focal lesion. Sinuses/Orbits: No acute finding. Other: None CT CERVICAL SPINE FINDINGS Alignment: No acute subluxation. There is straightening of normal cervical lordosis which may be positional or due to muscle spasm. Skull base and  vertebrae: No acute fracture.  Osteopenia. Soft tissues and spinal canal: No prevertebral fluid or swelling. No visible canal hematoma. Disc levels:  No acute findings.  Degenerative changes. Upper chest: Negative. Other: Bilateral carotid bulb calcified plaques. IMPRESSION: 1. No acute intracranial pathology. Moderate age-related atrophy and chronic microvascular ischemic changes. 2. No acute/traumatic cervical spine pathology. Electronically Signed   By: Elgie Collard M.D.   On: 02/20/2024 18:10   DG Ribs Unilateral W/Chest Right Result Date: 02/20/2024 CLINICAL DATA:  Pain after fall EXAM: Bilateral RIBS AND CHEST-5 VIEW COMPARISON:  None Available. FINDINGS: Status post median sternotomy. Lower most sternal wire is fractured. Calcified aorta. Normal cardiopericardial silhouette. No pneumothorax, effusion. No consolidation or edema. Standard chest x-ray is slightly under penetrated. Osteopenia. Rib x-rays are with osteopenia. No displaced rib fracture identified. IMPRESSION: No rib fracture seen.  No pneumothorax or effusion. Postop chest. Electronically Signed   By: Karen Kays M.D.   On: 02/20/2024 17:11   DG Ribs Unilateral W/Chest Left Result Date: 02/20/2024 CLINICAL DATA:  Pain after fall EXAM: Bilateral RIBS AND CHEST-5 VIEW COMPARISON:  None Available. FINDINGS: Status post median sternotomy. Lower most sternal wire is fractured. Calcified aorta. Normal cardiopericardial silhouette. No pneumothorax, effusion. No consolidation or edema. Standard chest x-ray is slightly under penetrated. Osteopenia. Rib x-rays are with osteopenia. No displaced rib fracture identified. IMPRESSION: No rib fracture seen.  No pneumothorax or effusion. Postop chest. Electronically Signed   By: Karen Kays M.D.   On: 02/20/2024 17:11   EP STUDY Result Date: 02/17/2024 See surgical note for result.   Microbiology: Results for orders placed or performed during the hospital encounter of 06/19/21  Resp Panel by  RT-PCR (Flu A&B, Covid) Nasopharyngeal Swab     Status: None   Collection Time: 06/19/21 12:12 PM   Specimen: Nasopharyngeal Swab; Nasopharyngeal(NP) swabs in vial transport medium  Result Value Ref Range Status   SARS Coronavirus 2 by RT PCR NEGATIVE NEGATIVE Final    Comment: (NOTE) SARS-CoV-2 target nucleic acids are NOT DETECTED.  The SARS-CoV-2 RNA is generally detectable in upper respiratory specimens during the acute phase of infection. The lowest concentration of SARS-CoV-2 viral copies this assay can detect is 138 copies/mL. A negative result does not preclude SARS-Cov-2 infection and should not be used as the sole basis for treatment or other patient management decisions. A negative result may occur with  improper specimen collection/handling, submission of specimen other than nasopharyngeal swab, presence of viral mutation(s) within the areas targeted by this assay, and inadequate number of viral copies(<138 copies/mL). A negative result must be combined with clinical observations, patient history, and epidemiological information. The expected result is Negative.  Fact Sheet for Patients:  BloggerCourse.com  Fact Sheet for Healthcare Providers:  SeriousBroker.it  This test is no t yet approved or cleared by the [place] FDA and  has been authorized for detection and/or diagnosis of SARS-CoV-2 by FDA under an Emergency Use Authorization (EUA). This EUA will remain  in effect (meaning this test can be used) for the duration of the COVID-19 declaration under Section 564(b)(1) of the Act, 21 U.S.C.section 360bbb-3(b)(1), unless the authorization is terminated  or revoked sooner.       Influenza A by PCR NEGATIVE NEGATIVE Final  Influenza B by PCR NEGATIVE NEGATIVE Final    Comment: (NOTE) The Xpert Xpress SARS-CoV-2/FLU/RSV plus assay is intended as an aid in the diagnosis of influenza from Nasopharyngeal swab  specimens and should not be used as a sole basis for treatment. Nasal washings and aspirates are unacceptable for Xpert Xpress SARS-CoV-2/FLU/RSV testing.  Fact Sheet for Patients: BloggerCourse.com  Fact Sheet for Healthcare Providers: SeriousBroker.it  This test is not yet approved or cleared by the [place] FDA and has been authorized for detection and/or diagnosis of SARS-CoV-2 by FDA under an Emergency Use Authorization (EUA). This EUA will remain in effect (meaning this test can be used) for the duration of the COVID-19 declaration under Section 564(b)(1) of the Act, 21 U.S.C. section 360bbb-3(b)(1), unless the authorization is terminated or revoked.  Performed at [facility], [address] [place], [place] [address]     Labs: CBC: Recent Labs  Lab 03/06/24 1201 03/06/24 1747 03/07/24 0425 03/07/24 0824 03/08/24 0356  WBC 6.9 7.7  --   --   --   HGB 11.5* 11.7* 12.7* 12.3* 11.3*  HCT 32.5* 33.7*  --   --  31.9*  MCV 90.5 90.1  --   --   --   PLT 241 264  --   --   --    Basic Metabolic Panel: Recent Labs  Lab 03/06/24 1201 03/08/24 0356  NA 131* 130*  K 4.3 3.2*  CL 102 102  CO2 24 22  GLUCOSE 115* 90  BUN 17 12  CREATININE 1.03 0.76  CALCIUM 8.1* 7.8*   Liver Function Tests: Recent Labs  Lab 03/06/24 1201  AST 30  ALT 41  ALKPHOS 101  BILITOT 1.1  PROT 5.3*  ALBUMIN 2.2*   CBG: No results for input(s): "GLUCAP" in the last 168 hours.  Discharge time spent: 36 minutes.  Signed: Marcelino Duster, MD [facility] 03/09/2024

## 2024-03-23 ENCOUNTER — Other Ambulatory Visit: Payer: Self-pay | Admitting: Student

## 2024-04-10 ENCOUNTER — Telehealth: Payer: Self-pay | Admitting: Cardiovascular Disease

## 2024-04-10 NOTE — Telephone Encounter (Signed)
 Wife Hilarie Lovely) called to report patient is now in hospice care.

## 2024-04-16 ENCOUNTER — Ambulatory Visit: Payer: Medicare Other | Admitting: Cardiovascular Disease

## 2024-04-18 ENCOUNTER — Ambulatory Visit: Admitting: Student

## 2024-05-06 DEATH — deceased

## 2024-05-21 ENCOUNTER — Encounter: Payer: Self-pay | Admitting: *Deleted
